# Patient Record
Sex: Female | Born: 1995
Health system: Southern US, Community
[De-identification: ages and names within clinical notes are randomized; demographics above are authoritative.]

## PROBLEM LIST (undated history)

## (undated) ENCOUNTER — Inpatient Hospital Stay (HOSPITAL_COMMUNITY): Payer: Self-pay

## (undated) ENCOUNTER — Inpatient Hospital Stay: Payer: Self-pay

## (undated) DIAGNOSIS — A749 Chlamydial infection, unspecified: Secondary | ICD-10-CM

## (undated) DIAGNOSIS — F329 Major depressive disorder, single episode, unspecified: Secondary | ICD-10-CM

## (undated) DIAGNOSIS — O24419 Gestational diabetes mellitus in pregnancy, unspecified control: Secondary | ICD-10-CM

## (undated) DIAGNOSIS — Z5900 Homelessness unspecified: Secondary | ICD-10-CM

## (undated) DIAGNOSIS — Z6711 Type A blood, Rh negative: Secondary | ICD-10-CM

## (undated) DIAGNOSIS — B1920 Unspecified viral hepatitis C without hepatic coma: Secondary | ICD-10-CM

## (undated) DIAGNOSIS — J45909 Unspecified asthma, uncomplicated: Secondary | ICD-10-CM

## (undated) DIAGNOSIS — F419 Anxiety disorder, unspecified: Secondary | ICD-10-CM

## (undated) DIAGNOSIS — B999 Unspecified infectious disease: Secondary | ICD-10-CM

## (undated) DIAGNOSIS — F1910 Other psychoactive substance abuse, uncomplicated: Secondary | ICD-10-CM

## (undated) DIAGNOSIS — F1111 Opioid abuse, in remission: Secondary | ICD-10-CM

## (undated) DIAGNOSIS — F32A Depression, unspecified: Secondary | ICD-10-CM

---
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
## 2014-07-11 ENCOUNTER — Emergency Department (HOSPITAL_COMMUNITY)
Admission: EM | Admit: 2014-07-11 | Discharge: 2014-07-11 | Disposition: A | Discharge status: Home / Self Care | Payer: Medicaid Other | Attending: Emergency Medicine | Admitting: Emergency Medicine

## 2014-07-11 ENCOUNTER — Encounter (HOSPITAL_COMMUNITY): Payer: Self-pay | Admitting: Emergency Medicine

## 2014-07-11 ENCOUNTER — Emergency Department (HOSPITAL_COMMUNITY): Payer: Medicaid Other

## 2014-07-11 DIAGNOSIS — IMO0002 Reserved for concepts with insufficient information to code with codable children: Secondary | ICD-10-CM | POA: Insufficient documentation

## 2014-07-11 DIAGNOSIS — Z880 Allergy status to penicillin: Secondary | ICD-10-CM | POA: Diagnosis not present

## 2014-07-11 DIAGNOSIS — Z79899 Other long term (current) drug therapy: Secondary | ICD-10-CM | POA: Diagnosis not present

## 2014-07-11 DIAGNOSIS — S0993XA Unspecified injury of face, initial encounter: Secondary | ICD-10-CM | POA: Diagnosis not present

## 2014-07-11 DIAGNOSIS — Y92410 Unspecified street and highway as the place of occurrence of the external cause: Secondary | ICD-10-CM | POA: Insufficient documentation

## 2014-07-11 DIAGNOSIS — Y9389 Activity, other specified: Secondary | ICD-10-CM | POA: Insufficient documentation

## 2014-07-11 DIAGNOSIS — S0990XA Unspecified injury of head, initial encounter: Secondary | ICD-10-CM | POA: Insufficient documentation

## 2014-07-11 DIAGNOSIS — Z3202 Encounter for pregnancy test, result negative: Secondary | ICD-10-CM | POA: Diagnosis not present

## 2014-07-11 DIAGNOSIS — F17200 Nicotine dependence, unspecified, uncomplicated: Secondary | ICD-10-CM | POA: Insufficient documentation

## 2014-07-11 DIAGNOSIS — Z8619 Personal history of other infectious and parasitic diseases: Secondary | ICD-10-CM | POA: Insufficient documentation

## 2014-07-11 DIAGNOSIS — J45909 Unspecified asthma, uncomplicated: Secondary | ICD-10-CM | POA: Insufficient documentation

## 2014-07-11 DIAGNOSIS — S199XXA Unspecified injury of neck, initial encounter: Secondary | ICD-10-CM | POA: Diagnosis not present

## 2014-07-11 LAB — URINALYSIS, ROUTINE W REFLEX MICROSCOPIC
BILIRUBIN URINE: NEGATIVE
Glucose, UA: NEGATIVE mg/dL
Hgb urine dipstick: NEGATIVE
NITRITE: NEGATIVE
PH: 6.5 (ref 5.0–8.0)
Protein, ur: NEGATIVE mg/dL
Urobilinogen, UA: 0.2 mg/dL (ref 0.0–1.0)

## 2014-07-11 LAB — URINE MICROSCOPIC-ADD ON

## 2014-07-11 LAB — PREGNANCY, URINE: PREG TEST UR: NEGATIVE

## 2014-07-11 MED ORDER — IBUPROFEN 800 MG PO TABS
800.0000 mg | ORAL_TABLET | Freq: Once | ORAL | Status: AC
  Administered 2014-07-11: 800 mg via ORAL
  Filled 2014-07-11: qty 1

## 2014-07-11 NOTE — ED Notes (Signed)
Pt remains in xray.
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
## 2014-07-11 NOTE — ED Notes (Signed)
Gown found on bed. PT not found anywhere in the dept. Pt did not inform anyone that she was leaving.

## 2014-09-23 ENCOUNTER — Encounter (HOSPITAL_COMMUNITY): Payer: Self-pay | Admitting: *Deleted

## 2014-09-23 ENCOUNTER — Emergency Department (HOSPITAL_COMMUNITY)
Admission: EM | Admit: 2014-09-23 | Discharge: 2014-09-23 | Discharge status: Against Medical Advice | Payer: Medicaid Other | Attending: Emergency Medicine | Admitting: Emergency Medicine

## 2014-09-23 DIAGNOSIS — F17210 Nicotine dependence, cigarettes, uncomplicated: Secondary | ICD-10-CM | POA: Diagnosis not present

## 2014-09-23 DIAGNOSIS — O99331 Smoking (tobacco) complicating pregnancy, first trimester: Secondary | ICD-10-CM | POA: Diagnosis not present

## 2014-09-23 DIAGNOSIS — O208 Other hemorrhage in early pregnancy: Secondary | ICD-10-CM | POA: Diagnosis present

## 2014-09-23 DIAGNOSIS — Z3A01 Less than 8 weeks gestation of pregnancy: Secondary | ICD-10-CM | POA: Insufficient documentation

## 2014-09-23 DIAGNOSIS — Z8619 Personal history of other infectious and parasitic diseases: Secondary | ICD-10-CM | POA: Insufficient documentation

## 2014-09-23 DIAGNOSIS — O99511 Diseases of the respiratory system complicating pregnancy, first trimester: Secondary | ICD-10-CM | POA: Insufficient documentation

## 2014-09-23 DIAGNOSIS — J45909 Unspecified asthma, uncomplicated: Secondary | ICD-10-CM | POA: Diagnosis not present

## 2014-09-23 NOTE — ED Notes (Signed)
Vag bleeding with cramping. [redacted] weeks pregnant.

## 2014-11-06 ENCOUNTER — Emergency Department: Payer: Self-pay | Admitting: Emergency Medicine

## 2014-11-06 LAB — DRUG SCREEN, URINE
Amphetamines, Ur Screen: POSITIVE (ref ?–1000)
Barbiturates, Ur Screen: NEGATIVE (ref ?–200)
Benzodiazepine, Ur Scrn: POSITIVE (ref ?–200)
CANNABINOID 50 NG, UR ~~LOC~~: POSITIVE (ref ?–50)
COCAINE METABOLITE, UR ~~LOC~~: POSITIVE (ref ?–300)
MDMA (Ecstasy)Ur Screen: NEGATIVE (ref ?–500)
Methadone, Ur Screen: NEGATIVE (ref ?–300)
OPIATE, UR SCREEN: NEGATIVE (ref ?–300)
Phencyclidine (PCP) Ur S: NEGATIVE (ref ?–25)
Tricyclic, Ur Screen: NEGATIVE (ref ?–1000)

## 2014-11-07 LAB — URINALYSIS, COMPLETE
BILIRUBIN, UR: NEGATIVE
Glucose,UR: NEGATIVE mg/dL (ref 0–75)
KETONE: NEGATIVE
Nitrite: NEGATIVE
PH: 6 (ref 4.5–8.0)
PROTEIN: NEGATIVE
RBC,UR: 22 /HPF (ref 0–5)
SPECIFIC GRAVITY: 1.005 (ref 1.003–1.030)
WBC UR: 11 /HPF (ref 0–5)

## 2015-03-22 DIAGNOSIS — B1920 Unspecified viral hepatitis C without hepatic coma: Secondary | ICD-10-CM | POA: Insufficient documentation

## 2015-03-22 HISTORY — DX: Unspecified viral hepatitis C without hepatic coma: B19.20

## 2015-06-28 ENCOUNTER — Encounter (HOSPITAL_COMMUNITY): Payer: Self-pay | Admitting: Emergency Medicine

## 2015-06-28 DIAGNOSIS — Z79899 Other long term (current) drug therapy: Secondary | ICD-10-CM | POA: Insufficient documentation

## 2015-06-28 DIAGNOSIS — Z8619 Personal history of other infectious and parasitic diseases: Secondary | ICD-10-CM | POA: Insufficient documentation

## 2015-06-28 DIAGNOSIS — K5900 Constipation, unspecified: Secondary | ICD-10-CM | POA: Insufficient documentation

## 2015-06-28 DIAGNOSIS — J45909 Unspecified asthma, uncomplicated: Secondary | ICD-10-CM | POA: Insufficient documentation

## 2015-06-28 DIAGNOSIS — R55 Syncope and collapse: Secondary | ICD-10-CM | POA: Diagnosis present

## 2015-06-28 DIAGNOSIS — Z720 Tobacco use: Secondary | ICD-10-CM | POA: Diagnosis not present

## 2015-06-28 DIAGNOSIS — Z880 Allergy status to penicillin: Secondary | ICD-10-CM | POA: Insufficient documentation

## 2015-06-28 LAB — CBC
HCT: 35.2 % — ABNORMAL LOW (ref 36.0–46.0)
HEMOGLOBIN: 12 g/dL (ref 12.0–15.0)
MCH: 30.8 pg (ref 26.0–34.0)
MCHC: 34.1 g/dL (ref 30.0–36.0)
MCV: 90.5 fL (ref 78.0–100.0)
PLATELETS: 139 10*3/uL — AB (ref 150–400)
RBC: 3.89 MIL/uL (ref 3.87–5.11)
RDW: 12.7 % (ref 11.5–15.5)
WBC: 5.2 10*3/uL (ref 4.0–10.5)

## 2015-06-28 LAB — CBG MONITORING, ED: GLUCOSE-CAPILLARY: 81 mg/dL (ref 65–99)

## 2015-06-28 NOTE — ED Notes (Addendum)
Pt arrives with syncope, slurred speech, and dizziness onset 1900 this evening. Pt lethargic in triage. Reports marijuana use, but no other drugs.

## 2015-06-29 ENCOUNTER — Emergency Department (HOSPITAL_COMMUNITY)
Admission: EM | Admit: 2015-06-29 | Discharge: 2015-06-29 | Disposition: A | Discharge status: Home / Self Care | Payer: Medicaid Other | Attending: Emergency Medicine | Admitting: Emergency Medicine

## 2015-06-29 DIAGNOSIS — K5900 Constipation, unspecified: Secondary | ICD-10-CM

## 2015-06-29 HISTORY — DX: Unspecified viral hepatitis C without hepatic coma: B19.20

## 2015-06-29 LAB — HEPATIC FUNCTION PANEL
ALBUMIN: 3.7 g/dL (ref 3.5–5.0)
ALK PHOS: 47 U/L (ref 38–126)
ALT: 45 U/L (ref 14–54)
AST: 48 U/L — ABNORMAL HIGH (ref 15–41)
Bilirubin, Direct: <0.1 mg/dL — ABNORMAL LOW (ref 0.1–0.5)
TOTAL PROTEIN: 6.6 g/dL (ref 6.5–8.1)
Total Bilirubin: 1.2 mg/dL (ref 0.3–1.2)

## 2015-06-29 LAB — BASIC METABOLIC PANEL
Anion gap: 9 (ref 5–15)
BUN: 11 mg/dL (ref 6–20)
CO2: 24 mmol/L (ref 22–32)
Calcium: 9.2 mg/dL (ref 8.9–10.3)
Chloride: 101 mmol/L (ref 101–111)
Creatinine, Ser: 0.66 mg/dL (ref 0.44–1.00)
GFR calc non Af Amer: >60 mL/min (ref >60)
Glucose, Bld: 75 mg/dL (ref 65–99)
POTASSIUM: 3.8 mmol/L (ref 3.5–5.1)
Sodium: 134 mmol/L — ABNORMAL LOW (ref 135–145)

## 2015-06-29 MED ORDER — POLYETHYLENE GLYCOL 3350 17 G PO PACK
17.0000 g | PACK | Freq: Once | ORAL | Status: AC
  Administered 2015-06-29: 17 g via ORAL
  Filled 2015-06-29: qty 1

## 2015-06-29 MED ORDER — SODIUM CHLORIDE 0.9 % IV BOLUS (SEPSIS)
1000.0000 mL | Freq: Once | INTRAVENOUS | Status: AC
  Administered 2015-06-29: 1000 mL via INTRAVENOUS

## 2015-06-29 MED ORDER — POLYETHYLENE GLYCOL 3350 17 G PO PACK: 17.0000 g | PACK | Freq: Once | ORAL | Status: DC

## 2015-06-29 NOTE — Discharge Instructions (Signed)
Constipation Laurie [name], your blood work today is normal.You are refusing to give a urine sample. This may be the cause of your abdominal pain and we can not tell without urine. Sea a primary care physician within 3 days for close follow-up. Take miralax as needed for constipation, if symptoms worsen come back to emergency department immediately. Thank you. Constipation is when a person:  Poops (has a bowel movement) less than 3 times a week.  Has a hard time pooping.  Has poop that is dry, hard, or bigger than normal. HOME CARE   Eat foods with a lot of fiber in them. This includes fruits, vegetables, beans, and whole grains such as brown rice.  Avoid fatty foods and foods with a lot of sugar. This includes french fries, hamburgers, cookies, candy, and soda.  If you are not getting enough fiber from food, take products with added fiber in them (supplements).  Drink enough fluid to keep your pee (urine) clear or pale yellow.  Exercise on a regular basis, or as told by your doctor.  Go to the restroom when you feel like you need to poop. Do not hold it.  Only take medicine as told by your doctor. Do not take medicines that help you poop (laxatives) without talking to your doctor first. GET HELP RIGHT AWAY IF:   You have bright red blood in your poop (stool).  Your constipation lasts more than 4 days or gets worse.  You have belly (abdominal) or butt (rectal) pain.  You have thin poop (as thin as a pencil).  You lose weight, and it cannot be explained. MAKE SURE YOU:   Understand these instructions.  Will watch your condition.  Will get help right away if you are not doing well or get worse. Document Released: 03/25/2008 Document Revised: 10/12/2013 Document Reviewed: 07/19/2013 [facility] Patient Information 2015 [place], [place]. This information is not intended to replace advice given to you by your health care provider. Make sure you discuss any questions you have with your  health care provider. Syncope Syncope means a person passes out (faints). The person usually wakes up in less than 5 minutes. It is important to seek medical care for syncope. HOME CARE  Have someone stay with you until you feel normal.  Do not drive, use machines, or play sports until your doctor says it is okay.  Keep all doctor visits as told.  Lie down when you feel like you might pass out. Take deep breaths. Wait until you feel normal before standing up.  Drink enough fluids to keep your pee (urine) clear or pale yellow.  If you take blood pressure or heart medicine, get up slowly. Take several minutes to sit and then stand. GET HELP RIGHT AWAY IF:   You have a severe headache.  You have pain in the chest, belly (abdomen), or back.  You are bleeding from the mouth or butt (rectum).  You have black or tarry poop (stool).  You have an irregular or very fast heartbeat.  You have pain with breathing.  You keep passing out, or you have shaking (seizures) when you pass out.  You pass out when sitting or lying down.  You feel confused.  You have trouble walking.  You have severe weakness.  You have vision problems. If you fainted, call your local emergency services (911 in [place] Do not drive yourself to the hospital. MAKE SURE YOU:   Understand these instructions.  Will watch your condition.  Will get  help right away if you are not doing well or get worse. Document Released: 03/25/2008 Document Revised: 04/07/2012 Document Reviewed: 12/06/2011 [facility] Patient Information 2015 [place], [place]. This information is not intended to replace advice given to you by your health care provider. Make sure you discuss any questions you have with your health care provider.

## 2015-06-29 NOTE — ED Provider Notes (Signed)
CSN: [id number]     Arrival date & time 06/28/15  2207 History  This chart was scribed for Laurie Crumble, MD by Freida Busman, ED Scribe. This patient was seen in room D34C/D34C and the patient's care was started 1:11 AM.   Chief Complaint  Patient presents with  . Loss of Consciousness    The history is provided by the patient. No language interpreter was used.   HPI Comments:  Laurie [name] is a 19 y.o. female with a history of Hep C, who presents to the Emergency Department complaining of lower abdominal pain. She reports associated constipation for a few weeks, nausea and vomiting. She denies fever.  She has no urinary or vaginal complaints. Pt also reports multiple syncopal episodes since 1900 yesterday with lightheadedness and fatigue. No alleviating factors noted. She does use marijuana but denies other drugs. Patient is concerned hepatitis C is causing all these symptoms.  Past Medical History  Diagnosis Date  . Asthma   . Chlamydia   . Pregnant   . Hepatitis C    Past Surgical History  Procedure Laterality Date  . No past surgeries     No family history on file. Social History  Substance Use Topics  . Smoking status: Current Every Day Smoker -- 1.00 packs/day    Types: Cigarettes  . Smokeless tobacco: None  . Alcohol Use: Yes     Comment: Occ   OB History    Gravida Para Term Preterm AB TAB SAB Ectopic Multiple Living       Review of Systems  A complete 10 system review of systems was obtained and all systems are negative except as noted in the HPI and PMH.    Allergies  Penicillins  Home Medications   Prior to Admission medications   Medication Sig Start Date End Date Taking? Authorizing Provider  acetaminophen (TYLENOL) 500 MG tablet Take 1,000 mg by mouth every 6 (six) hours as needed for pain.     Historical Provider, MD  ibuprofen (ADVIL,MOTRIN) 200 MG tablet Take 200 mg by mouth every 6 (six) hours as needed for pain or headache.     Historical Provider, MD  Nutritional Supplements (JUICE PLUS FIBRE PO) Take 2 capsules by mouth 2 (two) times daily.    Historical Provider, MD   BP 99/61 mmHg  Pulse 76  Temp(Src) 98.2 F (36.8 C) (Oral)  Resp 14  SpO2 100%  LMP 04/21/2015 (Approximate)  Breastfeeding? Unknown Physical Exam  Constitutional: She is oriented to person, place, and time. She appears well-developed and well-nourished. No distress.  HENT:  Head: Normocephalic and atraumatic.  Nose: Nose normal.  Mouth/Throat: Oropharynx is clear and moist. No oropharyngeal exudate.  Eyes: Conjunctivae and EOM are normal. Pupils are equal, round, and reactive to light. No scleral icterus.  Neck: Normal range of motion. Neck supple. No JVD present. No tracheal deviation present. No thyromegaly present.  Cardiovascular: Normal rate, regular rhythm and normal heart sounds.  Exam reveals no gallop and no friction rub.   No murmur heard. Pulmonary/Chest: Effort normal and breath sounds normal. No respiratory distress. She has no wheezes. She exhibits no tenderness.  Abdominal: Soft. Bowel sounds are normal. She exhibits no distension and no mass. There is no tenderness. There is no rebound and no guarding.  Musculoskeletal: Normal range of motion. She exhibits no edema or tenderness.  Lymphadenopathy:    She has no cervical adenopathy.  Neurological: She  is alert and oriented to person, place, and time. No cranial nerve deficit. She exhibits normal muscle tone.  Skin: Skin is warm and dry. No rash noted. No erythema. No pallor.  Nursing note and vitals reviewed.   ED Course  Procedures   DIAGNOSTIC STUDIES:  Oxygen Saturation is 100% on RA, normal by my interpretation.    COORDINATION OF CARE:  1:17 AM Discussed treatment plan with pt at bedside and pt agreed to plan.  Labs Review Labs Reviewed  BASIC METABOLIC PANEL - Abnormal; Notable for the following:    Sodium 134 (*)    All other components within normal  limits  CBC - Abnormal; Notable for the following:    HCT 35.2 (*)    Platelets 139 (*)    All other components within normal limits  HEPATIC FUNCTION PANEL - Abnormal; Notable for the following:    AST 48 (*)    Bilirubin, Direct <0.1 (*)    All other components within normal limits  URINALYSIS, ROUTINE W REFLEX MICROSCOPIC (NOT AT [facility])  URINE RAPID DRUG SCREEN, HOSP PERFORMED  CBG MONITORING, ED  POC URINE PREG, ED    Imaging Review No results found. I have personally reviewed and evaluated these images and lab results as part of my medical decision-making.   EKG Interpretation   Date/Time:  Wednesday June 28 2015 22:43:37 EDT Ventricular Rate:  76 PR Interval:  124 QRS Duration: 88 QT Interval:  370 QTC Calculation: 416 R Axis:   83 Text Interpretation:  Normal sinus rhythm Normal ECG Confirmed by Erroll Luna [phone]) on 06/29/2015 1:08:41 AM      MDM   Final diagnoses:  None    Patient presents emergency department for concern for her hepatitis C. She has had multiple complaints including lower abdominal pain, constipation 7 days, lightheadedness and syncope. EKG does not reveal any arrhythmias or cause for syncope. Patient is pending. Will also obtain LFTs. Patient is given IV fluids. I do not believe pelvic exam is warranted as the patient has no abdominal tenderness on exam.  Patient is refusing to give a urine sample. I would like to know if the patient is pregnant or has a urine infection causing her abdominal pain. Is likely patient does not want a drug screen performed on her. She was informed that we may not be able to diagnose her she refuses a sample, patient will not give a sample. At this point the patient, continues to slur her words in the drowsy. It is likely she is on an illicit substance. However she is able to ambulate without any assistance. Friends are in the room. Her vital signs were within her normal limits and she is safe for  discharge.   I personally performed the services described in this documentation, which was scribed in my presence. The recorded information has been reviewed and is accurate.    Laurie Crumble, MD 06/29/15 [phone]

## 2015-06-29 NOTE — ED Notes (Signed)
Attempted to obtain urine specimen from patient, states that she doesn't feel that that is necessary, states she isn't comfortable providing urine. MD notified, plan to discharge.

## 2015-06-29 NOTE — ED Notes (Signed)
Attempted to discharge pt and was not in room,  IV catheter pulled out by patient and left at bedside. RN unable to provide discharge instructions or teaching

## 2015-07-14 ENCOUNTER — Encounter (HOSPITAL_COMMUNITY): Payer: Self-pay

## 2015-07-14 ENCOUNTER — Inpatient Hospital Stay (HOSPITAL_COMMUNITY)
Admission: AD | Admit: 2015-07-14 | Discharge: 2015-07-14 | Disposition: A | Discharge status: Home / Self Care | Payer: Medicaid Other | Source: Ambulatory Visit | Attending: Family Medicine | Admitting: Family Medicine

## 2015-07-14 DIAGNOSIS — O99321 Drug use complicating pregnancy, first trimester: Secondary | ICD-10-CM | POA: Insufficient documentation

## 2015-07-14 DIAGNOSIS — B1920 Unspecified viral hepatitis C without hepatic coma: Secondary | ICD-10-CM | POA: Diagnosis not present

## 2015-07-14 DIAGNOSIS — Z3201 Encounter for pregnancy test, result positive: Secondary | ICD-10-CM

## 2015-07-14 DIAGNOSIS — R51 Headache: Secondary | ICD-10-CM | POA: Diagnosis present

## 2015-07-14 DIAGNOSIS — F1210 Cannabis abuse, uncomplicated: Secondary | ICD-10-CM

## 2015-07-14 DIAGNOSIS — O99331 Smoking (tobacco) complicating pregnancy, first trimester: Secondary | ICD-10-CM

## 2015-07-14 DIAGNOSIS — Z880 Allergy status to penicillin: Secondary | ICD-10-CM | POA: Insufficient documentation

## 2015-07-14 DIAGNOSIS — O98411 Viral hepatitis complicating pregnancy, first trimester: Secondary | ICD-10-CM | POA: Insufficient documentation

## 2015-07-14 DIAGNOSIS — B182 Chronic viral hepatitis C: Secondary | ICD-10-CM

## 2015-07-14 DIAGNOSIS — F1290 Cannabis use, unspecified, uncomplicated: Secondary | ICD-10-CM | POA: Insufficient documentation

## 2015-07-14 DIAGNOSIS — F17200 Nicotine dependence, unspecified, uncomplicated: Secondary | ICD-10-CM

## 2015-07-14 DIAGNOSIS — F17210 Nicotine dependence, cigarettes, uncomplicated: Secondary | ICD-10-CM | POA: Insufficient documentation

## 2015-07-14 DIAGNOSIS — Z3A10 10 weeks gestation of pregnancy: Secondary | ICD-10-CM | POA: Diagnosis not present

## 2015-07-14 HISTORY — DX: Opioid abuse, in remission: F11.11

## 2015-07-14 LAB — URINALYSIS, ROUTINE W REFLEX MICROSCOPIC
Bilirubin Urine: NEGATIVE
GLUCOSE, UA: NEGATIVE mg/dL
Hgb urine dipstick: NEGATIVE
KETONES UR: NEGATIVE mg/dL
LEUKOCYTES UA: NEGATIVE
NITRITE: NEGATIVE
Protein, ur: NEGATIVE mg/dL
Specific Gravity, Urine: 1.015 (ref 1.005–1.030)
Urobilinogen, UA: 4 mg/dL — ABNORMAL HIGH (ref 0.0–1.0)
pH: 7 (ref 5.0–8.0)

## 2015-07-14 LAB — RAPID URINE DRUG SCREEN, HOSP PERFORMED
AMPHETAMINES: NOT DETECTED
BENZODIAZEPINES: NOT DETECTED
Barbiturates: NOT DETECTED
COCAINE: NOT DETECTED
Opiates: NOT DETECTED
Tetrahydrocannabinol: POSITIVE — AB

## 2015-07-14 LAB — WET PREP, GENITAL
Trich, Wet Prep: NONE SEEN
YEAST WET PREP: NONE SEEN

## 2015-07-14 LAB — POCT PREGNANCY, URINE: PREG TEST UR: POSITIVE — AB

## 2015-07-14 MED ORDER — PRENATAL 27-1 MG PO TABS: 1.0000 | ORAL_TABLET | Freq: Every day | ORAL | Status: DC

## 2015-07-14 NOTE — Discharge Instructions (Signed)
No smoking, no drugs, no alcohol.  Take a prenatal vitamin one by mouth every day.  Eat small frequent snacks to avoid nausea.  Begin prenatal care as soon as possible. ° °

## 2015-07-14 NOTE — MAU Note (Addendum)
Unsure of LMP but know I am really late this month. Dizzy and nausea. Peeing a lot. Trying to find out if i am preg. Have not done upt at home. Occ back pain and crampiness more on L side of abd. No pain now. No bleeding but white vag d/c

## 2015-07-14 NOTE — MAU Provider Note (Signed)
History     CSN: [id number]  Arrival date and time: 07/14/15 1906   Seen by provider at 1935    Chief Complaint  Patient presents with  . Possible Pregnancy   HPI Laurie [name] 19 y.o.  Comes to MAU with unsure LMP (possibly July 1) but thinks she might be pregnant.  States she thinks she had some bleeding last month but was not a normal period.  Had a home test yesterday that was slightly positive.  Was seen at the ER earlier in Sept but refused to give a urine sample at that visit (provider suspected possible drug use at that visit).  Has been urinating frequently but does not have dysuria.  Has not been using any condoms or other contraception - thought she could not get pregnant.  Reports use of marijuana last night, smoking cigarettes daily, and used heroin last about 3 months ago.  Reports she has tested positive for Hepatitis C while she was in jail and has not seen a doctor for the condition.  States her sex partner is aware she is positive for Hepatitis C.  Has just gone on her father's insurance - [employer].    Was having some headaches, dizziness, decreased appetite, and low back pain.  Has had some lower abdominal cramping but does not have pain tonight.   OB History    Gravida Para Term Preterm AB TAB SAB Ectopic Multiple Living        Past Medical History  Diagnosis Date  . Asthma   . Chlamydia   . Pregnant   . Hepatitis C   . History of heroin abuse     Past Surgical History  Procedure Laterality Date  . No past surgeries      Family History  Problem Relation Age of Onset  . Diabetes Mother     Social History  Substance Use Topics  . Smoking status: Current Every Day Smoker -- 1.00 packs/day    Types: Cigarettes  . Smokeless tobacco: None  . Alcohol Use: Yes     Comment: Occas use.    Allergies:  Allergies  Allergen Reactions  . Penicillins Other (See Comments)    Results thru an allergy skin test     No prescriptions prior to  admission    Review of Systems  Constitutional: Negative for fever.  Gastrointestinal: Positive for nausea, abdominal pain and constipation. Negative for vomiting.  Genitourinary: Positive for frequency. Negative for dysuria.       Vaginal discharge. No vaginal bleeding.    Neurological: Positive for headaches.   Physical Exam   Blood pressure 119/56, pulse 73, temperature 97.7 F (36.5 C), resp. rate 18, height  (1.6 m), weight 116 lb 9.6 oz (52.889 kg), last menstrual period 04/21/2015, unknown if currently breastfeeding.  Physical Exam  Nursing note and vitals reviewed. Constitutional: She is oriented to person, place, and time. She appears well-developed and well-nourished.  HENT:  Head: Normocephalic.  Eyes: EOM are normal.  Neck: Neck supple.  GI: Soft. There is no tenderness.  Uterus palpated slightly above pubic symphysis.  FHT 150 with doppler.  Genitourinary:  Speculum exam: Vagina - Small amount of creamy discharge, no odor Cervix - No contact bleeding Bimanual exam: Cervix closed Uterus non tender, 10 week size Adnexa non tender, no masses bilaterally GC/Chlam, wet prep done Chaperone present for exam.  Musculoskeletal: Normal range of motion.  Neurological: She is alert  and oriented to person, place, and time.  Skin: Skin is warm and dry.  Psychiatric: She has a normal mood and affect.    MAU Course  Procedures  MDM Results for orders placed or performed during the hospital encounter of 07/14/15 (from the past 24 hour(s))  Urinalysis, Routine w reflex microscopic (not at [facility])     Status: Abnormal   Collection Time: 07/14/15  7:20 PM  Result Value Ref Range   Color, Urine YELLOW YELLOW   APPearance CLEAR CLEAR   Specific Gravity, Urine 1.015 1.005 - 1.030   pH 7.0 5.0 - 8.0   Glucose, UA NEGATIVE NEGATIVE mg/dL   Hgb urine dipstick NEGATIVE NEGATIVE   Bilirubin Urine NEGATIVE NEGATIVE   Ketones, ur NEGATIVE NEGATIVE mg/dL   Protein, ur  NEGATIVE NEGATIVE mg/dL   Urobilinogen, UA 4.0 (H) 0.0 - 1.0 mg/dL   Nitrite NEGATIVE NEGATIVE   Leukocytes, UA NEGATIVE NEGATIVE  Urine rapid drug screen (hosp performed)     Status: Abnormal   Collection Time: 07/14/15  7:20 PM  Result Value Ref Range   Opiates NONE DETECTED NONE DETECTED   Cocaine NONE DETECTED NONE DETECTED   Benzodiazepines NONE DETECTED NONE DETECTED   Amphetamines NONE DETECTED NONE DETECTED   Tetrahydrocannabinol POSITIVE (A) NONE DETECTED   Barbiturates NONE DETECTED NONE DETECTED  Pregnancy, urine POC     Status: Abnormal   Collection Time: 07/14/15  7:31 PM  Result Value Ref Range   Preg Test, Ur POSITIVE (A) NEGATIVE  Wet prep, genital     Status: Abnormal   Collection Time: 07/14/15  7:45 PM  Result Value Ref Range   Yeast Wet Prep HPF POC NONE SEEN NONE SEEN   Trich, Wet Prep NONE SEEN NONE SEEN   Clue Cells Wet Prep HPF POC FEW (A) NONE SEEN   WBC, Wet Prep HPF POC FEW (A) NONE SEEN     Assessment and Plan  Pregnancy 10-12 weeks, Unsure LMP Hx of heroin use and current marijuana use Hepatitis C with no current management Smoker  Plan Advised no smoking, no alcohol, and emphasized to stop all drug use. Will prescribe prenatal vitamins Begin prenatal care as soon as possible. Discussed appropriate eating habits for pregnancy to avoid being dizzy and to avoid syncopal episodes. Take Tylenol 325 mg 2 tablets by mouth every 4 hours if needed for pain. Drink at least 8 8-oz glasses of water every day.     BURLESON,TERRI 07/14/2015, 8:01 PM

## 2015-07-15 LAB — RPR: RPR Ser Ql: NONREACTIVE

## 2015-07-15 LAB — HIV ANTIBODY (ROUTINE TESTING W REFLEX): HIV SCREEN 4TH GENERATION: NONREACTIVE

## 2015-07-17 LAB — GC/CHLAMYDIA PROBE AMP (~~LOC~~) NOT AT ARMC
Chlamydia: NEGATIVE
Neisseria Gonorrhea: NEGATIVE

## 2015-07-23 ENCOUNTER — Encounter (HOSPITAL_COMMUNITY): Payer: Self-pay | Admitting: *Deleted

## 2015-07-23 ENCOUNTER — Inpatient Hospital Stay (HOSPITAL_COMMUNITY)
Admission: AD | Admit: 2015-07-23 | Discharge: 2015-07-23 | Disposition: A | Discharge status: Home / Self Care | Payer: Medicaid Other | Source: Ambulatory Visit | Attending: Obstetrics and Gynecology | Admitting: Obstetrics and Gynecology

## 2015-07-23 DIAGNOSIS — R109 Unspecified abdominal pain: Secondary | ICD-10-CM

## 2015-07-23 DIAGNOSIS — O2311 Infections of bladder in pregnancy, first trimester: Secondary | ICD-10-CM

## 2015-07-23 DIAGNOSIS — O26899 Other specified pregnancy related conditions, unspecified trimester: Secondary | ICD-10-CM

## 2015-07-23 DIAGNOSIS — Z87891 Personal history of nicotine dependence: Secondary | ICD-10-CM | POA: Insufficient documentation

## 2015-07-23 DIAGNOSIS — N760 Acute vaginitis: Secondary | ICD-10-CM

## 2015-07-23 DIAGNOSIS — B9689 Other specified bacterial agents as the cause of diseases classified elsewhere: Secondary | ICD-10-CM | POA: Diagnosis not present

## 2015-07-23 DIAGNOSIS — K5900 Constipation, unspecified: Secondary | ICD-10-CM | POA: Diagnosis not present

## 2015-07-23 DIAGNOSIS — O23591 Infection of other part of genital tract in pregnancy, first trimester: Secondary | ICD-10-CM | POA: Insufficient documentation

## 2015-07-23 DIAGNOSIS — N3090 Cystitis, unspecified without hematuria: Secondary | ICD-10-CM | POA: Insufficient documentation

## 2015-07-23 DIAGNOSIS — O9989 Other specified diseases and conditions complicating pregnancy, childbirth and the puerperium: Secondary | ICD-10-CM

## 2015-07-23 DIAGNOSIS — Z3A13 13 weeks gestation of pregnancy: Secondary | ICD-10-CM | POA: Diagnosis not present

## 2015-07-23 HISTORY — DX: Unspecified infectious disease: B99.9

## 2015-07-23 HISTORY — DX: Anxiety disorder, unspecified: F41.9

## 2015-07-23 HISTORY — DX: Major depressive disorder, single episode, unspecified: F32.9

## 2015-07-23 HISTORY — DX: Depression, unspecified: F32.A

## 2015-07-23 LAB — URINALYSIS, ROUTINE W REFLEX MICROSCOPIC
BILIRUBIN URINE: NEGATIVE
GLUCOSE, UA: NEGATIVE mg/dL
Ketones, ur: NEGATIVE mg/dL
Nitrite: NEGATIVE
PH: 6 (ref 5.0–8.0)
Protein, ur: NEGATIVE mg/dL
UROBILINOGEN UA: 0.2 mg/dL (ref 0.0–1.0)

## 2015-07-23 LAB — RAPID URINE DRUG SCREEN, HOSP PERFORMED
Amphetamines: NOT DETECTED
BARBITURATES: NOT DETECTED
Benzodiazepines: NOT DETECTED
Cocaine: NOT DETECTED
OPIATES: NOT DETECTED
TETRAHYDROCANNABINOL: POSITIVE — AB

## 2015-07-23 LAB — CBC
HCT: 32.8 % — ABNORMAL LOW (ref 36.0–46.0)
Hemoglobin: 11.1 g/dL — ABNORMAL LOW (ref 12.0–15.0)
MCH: 30.7 pg (ref 26.0–34.0)
MCHC: 33.8 g/dL (ref 30.0–36.0)
MCV: 90.9 fL (ref 78.0–100.0)
PLATELETS: 123 10*3/uL — AB (ref 150–400)
RBC: 3.61 MIL/uL — ABNORMAL LOW (ref 3.87–5.11)
RDW: 13.4 % (ref 11.5–15.5)
WBC: 5.7 10*3/uL (ref 4.0–10.5)

## 2015-07-23 LAB — URINE MICROSCOPIC-ADD ON

## 2015-07-23 LAB — WET PREP, GENITAL
Trich, Wet Prep: NONE SEEN
YEAST WET PREP: NONE SEEN

## 2015-07-23 LAB — ABO/RH: ABO/RH(D): A NEG

## 2015-07-23 MED ORDER — CEPHALEXIN 500 MG PO CAPS: 500.0000 mg | ORAL_CAPSULE | Freq: Four times a day (QID) | ORAL | Status: DC

## 2015-07-23 MED ORDER — ACETAMINOPHEN 325 MG PO TABS
650.0000 mg | ORAL_TABLET | Freq: Once | ORAL | Status: AC
  Administered 2015-07-23: 650 mg via ORAL
  Filled 2015-07-23: qty 2

## 2015-07-23 MED ORDER — METRONIDAZOLE 500 MG PO TABS: 500.0000 mg | ORAL_TABLET | Freq: Two times a day (BID) | ORAL | Status: DC

## 2015-07-23 MED ORDER — ONDANSETRON 8 MG PO TBDP
8.0000 mg | ORAL_TABLET | Freq: Once | ORAL | Status: AC
  Administered 2015-07-23: 8 mg via ORAL
  Filled 2015-07-23: qty 1

## 2015-07-23 NOTE — MAU Provider Note (Signed)
History     CSN: [id number]  Arrival date and time: 07/23/15 1526   First Provider Initiated Contact with Patient 07/23/15 1630         Chief Complaint  Patient presents with  . Abdominal Pain   HPI  Laurie [name] is a 19 y.o. G2P0010 at [redacted]w[redacted]d who presents with abdominal cramping.  Symptoms started 3 days ago. Intermittent cramp like pain in lower abdomen. Rates 10/10. No treatment.  Denies vaginal bleeding.  Milky off white discharge with foul odor. Denies vaginal itching/irritation.  Increased urinary frequency & worsening abdominal pain with urination.  Last BM was last night; reports constipation & straining with bowel movements.  Denies fever.    OB History    Gravida Para Term Preterm AB TAB SAB Ectopic Multiple Living        Past Medical History  Diagnosis Date  . Chlamydia   . Pregnant   . History of heroin abuse   . Asthma     "grew out"  . Hepatitis C June 2016  . Infection     UTI  . Anxiety   . Depression     doing really good    Past Surgical History  Procedure Laterality Date  . No past surgeries      Family History  Problem Relation Age of Onset  . Diabetes Mother   . Pulmonary embolism Mother   . Stroke Mother   . Epilepsy Brother   . Cancer Paternal Grandmother     liver    Social History  Substance Use Topics  . Smoking status: Former Smoker -- 1.00 packs/day    Types: Cigarettes  . Smokeless tobacco: Never Used     Comment: quit with + preg test  . Alcohol Use: Yes     Comment: Occas use.; none since mid Sept    Allergies:  Allergies  Allergen Reactions  . Penicillins Other (See Comments)    Results thru an allergy skin test     Prescriptions prior to admission  Medication Sig Dispense Refill Last Dose  . PRENATAL 27-1 MG TABS Take 1 tablet by mouth daily. 30 each 0     ROS Physical Exam   Blood pressure 106/62, pulse 85, temperature 98.2 F (36.8 C), temperature source Oral, resp. rate 16,  weight 117 lb 6.4 oz (53.252 kg), last menstrual period 04/21/2015, unknown if currently breastfeeding.  Physical Exam  Nursing note and vitals reviewed. Constitutional: She is oriented to person, place, and time. She appears well-developed and well-nourished. No distress.  HENT:  Head: Normocephalic and atraumatic.  Eyes: Conjunctivae are normal. Right eye exhibits no discharge. Left eye exhibits no discharge. No scleral icterus.  Neck: Normal range of motion.  Cardiovascular: Normal rate, regular rhythm and normal heart sounds.   No murmur heard. Respiratory: Effort normal and breath sounds normal. No respiratory distress. She has no wheezes.  GI: Soft.  Genitourinary: Vagina normal. Rectal exam shows no external hemorrhoid. Cervix exhibits discharge (small amount of thin off-white discharge). Cervix exhibits no motion tenderness and no friability.  Neurological: She is alert and oriented to person, place, and time.  Skin: Skin is warm and dry. She is not diaphoretic.  Psychiatric: She has a normal mood and affect. Her behavior is normal. Judgment and thought content normal.   FHT 153 MAU Course  Procedures Results for orders placed or performed during the hospital encounter of 07/23/15 (from the past  24 hour(s))  Urinalysis, Routine w reflex microscopic (not at [facility])     Status: Abnormal   Collection Time: 07/23/15  3:45 PM  Result Value Ref Range   Color, Urine YELLOW YELLOW   APPearance CLEAR CLEAR   Specific Gravity, Urine <1.005 (L) 1.005 - 1.030   pH 6.0 5.0 - 8.0   Glucose, UA NEGATIVE NEGATIVE mg/dL   Hgb urine dipstick TRACE (A) NEGATIVE   Bilirubin Urine NEGATIVE NEGATIVE   Ketones, ur NEGATIVE NEGATIVE mg/dL   Protein, ur NEGATIVE NEGATIVE mg/dL   Urobilinogen, UA 0.2 0.0 - 1.0 mg/dL   Nitrite NEGATIVE NEGATIVE   Leukocytes, UA LARGE (A) NEGATIVE  Urine rapid drug screen (hosp performed)     Status: Abnormal   Collection Time: 07/23/15  3:45 PM  Result Value Ref  Range   Opiates NONE DETECTED NONE DETECTED   Cocaine NONE DETECTED NONE DETECTED   Benzodiazepines NONE DETECTED NONE DETECTED   Amphetamines NONE DETECTED NONE DETECTED   Tetrahydrocannabinol POSITIVE (A) NONE DETECTED   Barbiturates NONE DETECTED NONE DETECTED  Urine microscopic-add on     Status: Abnormal   Collection Time: 07/23/15  3:45 PM  Result Value Ref Range   Squamous Epithelial / LPF FEW (A) RARE   WBC, UA 21-50 <3 WBC/hpf   RBC / HPF 0-2 <3 RBC/hpf   Bacteria, UA RARE RARE  CBC     Status: Abnormal   Collection Time: 07/23/15  4:49 PM  Result Value Ref Range   WBC 5.7 4.0 - 10.5 K/uL   RBC 3.61 (L) 3.87 - 5.11 MIL/uL   Hemoglobin 11.1 (L) 12.0 - 15.0 g/dL   HCT 16.1 (L) 09.6 - 04.5 %   MCV 90.9 78.0 - 100.0 fL   MCH 30.7 26.0 - 34.0 pg   MCHC 33.8 30.0 - 36.0 g/dL   RDW 40.9 81.1 - 91.4 %   Platelets 123 (L) 150 - 400 K/uL  ABO/Rh     Status: None (Preliminary result)   Collection Time: 07/23/15  4:49 PM  Result Value Ref Range   ABO/RH(D) A NEG   Wet prep, genital     Status: Abnormal   Collection Time: 07/23/15  5:25 PM  Result Value Ref Range   Yeast Wet Prep HPF POC NONE SEEN NONE SEEN   Trich, Wet Prep NONE SEEN NONE SEEN   Clue Cells Wet Prep HPF POC FEW (A) NONE SEEN   WBC, Wet Prep HPF POC MANY (A) NONE SEEN    MDM FHT 153 by doppler Plan on starting care with Dr. Clearance Coots this weekend.  Tylenol & zofran given - reports relief in symptoms GC/CT deferred as they were negative 1 week ago  Assessment and Plan  A: 1. Abdominal cramping affecting pregnancy   2. BV (bacterial vaginosis)   3. Cystitis during pregnancy in first trimester, antepartum   4. Constipation, unspecified constipation type    P: Discharge home Keep prenatal appt Increase fiber in diet & increase water intake Take OTC stool softener daily Rx keflex & flagyl Urine culture pending  [name], NP   07/23/2015, 4:24 PM

## 2015-07-23 NOTE — Discharge Instructions (Signed)
Constipation °Constipation is when a person has fewer than three bowel movements a week, has difficulty having a bowel movement, or has stools that are dry, hard, or larger than normal. As people grow older, constipation is more common. If you try to fix constipation with medicines that make you have a bowel movement (laxatives), the problem may get worse. Long-term laxative use may cause the muscles of the colon to become weak. A low-fiber diet, not taking in enough fluids, and taking certain medicines may make constipation worse.  °CAUSES  °· Certain medicines, such as antidepressants, pain medicine, iron supplements, antacids, and water pills.   °· Certain diseases, such as diabetes, irritable bowel syndrome (IBS), thyroid disease, or depression.   °· Not drinking enough water.   °· Not eating enough fiber-rich foods.   °· Stress or travel.   °· Lack of physical activity or exercise.   °· Ignoring the urge to have a bowel movement.   °· Using laxatives too much.   °SIGNS AND SYMPTOMS  °· Having fewer than three bowel movements a week.   °· Straining to have a bowel movement.   °· Having stools that are hard, dry, or larger than normal.   °· Feeling full or bloated.   °· Pain in the lower abdomen.   °· Not feeling relief after having a bowel movement.   °DIAGNOSIS  °Your health care provider will take a medical history and perform a physical exam. Further testing may be done for severe constipation. Some tests may include: °· A barium enema X-ray to examine your rectum, colon, and, sometimes, your small intestine.   °· A sigmoidoscopy to examine your lower colon.   °· A colonoscopy to examine your entire colon. °TREATMENT  °Treatment will depend on the severity of your constipation and what is causing it. Some dietary treatments include drinking more fluids and eating more fiber-rich foods. Lifestyle treatments may include regular exercise. If these diet and lifestyle recommendations do not help, your health care  provider may recommend taking over-the-counter laxative medicines to help you have bowel movements. Prescription medicines may be prescribed if over-the-counter medicines do not work.  °HOME CARE INSTRUCTIONS  °· Eat foods that have a lot of fiber, such as fruits, vegetables, whole grains, and beans. °· Limit foods high in fat and processed sugars, such as french fries, hamburgers, cookies, candies, and soda.   °· A fiber supplement may be added to your diet if you cannot get enough fiber from foods.   °· Drink enough fluids to keep your urine clear or pale yellow.   °· Exercise regularly or as directed by your health care provider.   °· Go to the restroom when you have the urge to go. Do not hold it.   °· Only take over-the-counter or prescription medicines as directed by your health care provider. Do not take other medicines for constipation without talking to your health care provider first.   °SEEK IMMEDIATE MEDICAL CARE IF:  °· You have bright red blood in your stool.   °· Your constipation lasts for more than 4 days or gets worse.   °· You have abdominal or rectal pain.   °· You have thin, pencil-like stools.   °· You have unexplained weight loss. °MAKE SURE YOU:  °· Understand these instructions. °· Will watch your condition. °· Will get help right away if you are not doing well or get worse. °Document Released: 07/05/2004 Document Revised: 10/12/2013 Document Reviewed: 07/19/2013 °ExitCare® Patient Information ©2015 [employer]. This information is not intended to replace advice given to you by your health care provider. Make sure you discuss any questions   you have with your health care provider. ° °Bacterial Vaginosis °Bacterial vaginosis is a vaginal infection that occurs when the normal balance of bacteria in the vagina is disrupted. It results from an overgrowth of certain bacteria. This is the most common vaginal infection in women of childbearing age. Treatment is important to prevent complications,  especially in pregnant women, as it can cause a premature delivery. °CAUSES  °Bacterial vaginosis is caused by an increase in harmful bacteria that are normally present in smaller amounts in the vagina. Several different kinds of bacteria can cause bacterial vaginosis. However, the reason that the condition develops is not fully understood. °RISK FACTORS °Certain activities or behaviors can put you at an increased risk of developing bacterial vaginosis, including: °· Having a new sex partner or multiple sex partners. °· Douching. °· Using an intrauterine device (IUD) for contraception. °Women do not get bacterial vaginosis from toilet seats, bedding, swimming pools, or contact with objects around them. °SIGNS AND SYMPTOMS  °Some women with bacterial vaginosis have no signs or symptoms. Common symptoms include: °· Grey vaginal discharge. °· A fishlike odor with discharge, especially after sexual intercourse. °· Itching or burning of the vagina and vulva. °· Burning or pain with urination. °DIAGNOSIS  °Your health care provider will take a medical history and examine the vagina for signs of bacterial vaginosis. A sample of vaginal fluid may be taken. Your health care provider will look at this sample under a microscope to check for bacteria and abnormal cells. A vaginal pH test may also be done.  °TREATMENT  °Bacterial vaginosis may be treated with antibiotic medicines. These may be given in the form of a pill or a vaginal cream. A second round of antibiotics may be prescribed if the condition comes back after treatment.  °HOME CARE INSTRUCTIONS  °· Only take over-the-counter or prescription medicines as directed by your health care provider. °· If antibiotic medicine was prescribed, take it as directed. Make sure you finish it even if you start to feel better. °· Do not have sex until treatment is completed. °· Tell all sexual partners that you have a vaginal infection. They should see their health care provider and  be treated if they have problems, such as a mild rash or itching. °· Practice safe sex by using condoms and only having one sex partner. °SEEK MEDICAL CARE IF:  °· Your symptoms are not improving after 3 days of treatment. °· You have increased discharge or pain. °· You have a fever. °MAKE SURE YOU:  °· Understand these instructions. °· Will watch your condition. °· Will get help right away if you are not doing well or get worse. °FOR MORE INFORMATION  °Centers for Disease Control and Prevention, Division of STD Prevention: www.cdc.gov/std °American Sexual Health Association (ASHA): www.ashastd.org  °Document Released: 10/07/2005 Document Revised: 07/28/2013 Document Reviewed: 05/19/2013 °ExitCare® Patient Information ©2015 [employer]. This information is not intended to replace advice given to you by your health care provider. Make sure you discuss any questions you have with your health care provider. ° ° °

## 2015-07-23 NOTE — MAU Note (Signed)
Last several days, has been having really bad cramping.  Sometimes the pain is so bad, she can't even walk.  Lot of pressure when she pees, no pain

## 2015-07-24 LAB — CULTURE, OB URINE: Special Requests: NORMAL

## 2015-07-26 ENCOUNTER — Encounter: Payer: Medicaid Other | Admitting: Certified Nurse Midwife

## 2015-07-28 ENCOUNTER — Ambulatory Visit (INDEPENDENT_AMBULATORY_CARE_PROVIDER_SITE_OTHER): Payer: Medicaid Other | Admitting: Certified Nurse Midwife

## 2015-07-28 ENCOUNTER — Other Ambulatory Visit: Payer: Self-pay | Admitting: Certified Nurse Midwife

## 2015-07-28 ENCOUNTER — Encounter: Payer: Self-pay | Admitting: Certified Nurse Midwife

## 2015-07-28 VITALS — BP 108/66 | HR 85 | Temp 97.8°F | Wt 119.0 lb

## 2015-07-28 DIAGNOSIS — Z3402 Encounter for supervision of normal first pregnancy, second trimester: Secondary | ICD-10-CM | POA: Diagnosis not present

## 2015-07-28 DIAGNOSIS — O219 Vomiting of pregnancy, unspecified: Secondary | ICD-10-CM

## 2015-07-28 DIAGNOSIS — K5901 Slow transit constipation: Secondary | ICD-10-CM

## 2015-07-28 LAB — POCT URINALYSIS DIPSTICK
Bilirubin, UA: NEGATIVE
Blood, UA: NEGATIVE
GLUCOSE UA: NEGATIVE
Ketones, UA: NEGATIVE
LEUKOCYTES UA: NEGATIVE
NITRITE UA: NEGATIVE
Protein, UA: NEGATIVE
Spec Grav, UA: 1.015
UROBILINOGEN UA: NEGATIVE
pH, UA: 6

## 2015-07-28 MED ORDER — DOCUSATE SODIUM 100 MG PO CAPS: 100.0000 mg | ORAL_CAPSULE | Freq: Two times a day (BID) | ORAL | Status: DC

## 2015-07-28 MED ORDER — CITRANATAL HARMONY 30-1-260 MG PO CAPS: 1.0000 | ORAL_CAPSULE | Freq: Every day | ORAL | Status: DC

## 2015-07-28 MED ORDER — METOCLOPRAMIDE HCL 10 MG PO TABS: 10.0000 mg | ORAL_TABLET | Freq: Four times a day (QID) | ORAL | Status: DC

## 2015-07-28 MED ORDER — DOXYLAMINE-PYRIDOXINE 10-10 MG PO TBEC: DELAYED_RELEASE_TABLET | ORAL | Status: DC

## 2015-07-28 NOTE — Progress Notes (Signed)
Subjective:    Laurie [name] is being seen today for her first obstetrical visit.  This is not a planned pregnancy. She is at [redacted]w[redacted]d gestation. Her obstetrical history is significant for smoker and recent Hep. C dx, not treated.  Occasional smoker about 3 times a day. Asthma in childhood.  Heroin use last reported use in June 2016.  Current mariajuana use, last time a few weeks ago.  Relationship with FOB: not involved. Patient does intend to breast feed. Pregnancy history fully reviewed.  The information documented in the HPI was reviewed and verified.  Menstrual History: OB History    Gravida Para Term Preterm AB TAB SAB Ectopic Multiple Living        Menarche age: 19 years of age roughly  Patient's last menstrual period was 04/21/2015. Unsure.     Past Medical History  Diagnosis Date  . Chlamydia   . Pregnant   . History of heroin abuse   . Asthma     "grew out"  . Hepatitis C June 2016  . Infection     UTI  . Anxiety   . Depression     doing really good    Past Surgical History  Procedure Laterality Date  . No past surgeries       (Not in a hospital admission) Allergies  Allergen Reactions  . Penicillins Other (See Comments)    Results thru an allergy skin test Has patient had a PCN reaction causing immediate rash, facial/tongue/throat swelling, SOB or lightheadedness with hypotension: No Has patient had a PCN reaction causing severe rash involving mucus membranes or skin necrosis: No Has patient had a PCN reaction that required hospitalization No Has patient had a PCN reaction occurring within the last 10 years: No If all of the above answers are "NO", then may proceed with Cephalosporin use.      Social History  Substance Use Topics  . Smoking status: Former Smoker -- 1.00 packs/day    Types: Cigarettes  . Smokeless tobacco: Never Used     Comment: quit with + preg test  . Alcohol Use: No     Comment: Occas use.; none since mid Sept     Family History  Problem Relation Age of Onset  . Diabetes Mother   . Pulmonary embolism Mother   . Stroke Mother   . Epilepsy Brother   . Cancer Paternal Grandmother     liver     Review of Systems Constitutional: negative for weight loss Gastrointestinal: + for nausea & vomiting Genitourinary:negative for genital lesions and vaginal discharge and dysuria Musculoskeletal:negative for back pain Behavioral/Psych: negative for abusive relationship, depression, illegal drug usage and tobacco use    Objective:    BP 108/66 mmHg  Pulse 85  Temp(Src) 97.8 F (36.6 C)  Wt 119 lb (53.978 kg)  LMP 04/21/2015 General Appearance:    Alert, cooperative, no distress, appears stated age  Head:    Normocephalic, without obvious abnormality, atraumatic  Eyes:    PERRL, conjunctiva/corneas clear, EOM's intact, fundi    benign, both eyes  Ears:    Normal TM's and external ear canals, both ears  Nose:   Nares normal, septum midline, mucosa normal, no drainage    or sinus tenderness  Throat:   Lips, mucosa, and tongue normal; teeth and gums normal  Neck:   Supple, symmetrical, trachea midline, no adenopathy;    thyroid:  no enlargement/tenderness/nodules; no carotid  bruit or JVD  Back:     Symmetric, no curvature, ROM normal, no CVA tenderness  Lungs:     Clear to auscultation bilaterally, respirations unlabored  Chest Wall:    No tenderness or deformity   Heart:    Regular rate and rhythm, S1 and S2 normal, no murmur, rub   or gallop  Breast Exam:    No tenderness, masses, or nipple abnormality  Abdomen:     Soft, non-tender, bowel sounds active all four quadrants,    no masses, no organomegaly  Genitalia:    Normal female without lesion, discharge or tenderness  Extremities:   Extremities normal, atraumatic, no cyanosis or edema  Pulses:   2+ and symmetric all extremities  Skin:   Skin color, texture, turgor normal, no rashes or lesions  Lymph nodes:   Cervical, supraclavicular, and  axillary nodes normal  Neurologic:   CNII-XII intact, normal strength, sensation and reflexes    throughout                                  Cervix:  Long, thick, closed & posterior                                      FH:  Around symphysis pubis             FHR:   158    Lab Review Urine pregnancy test Labs reviewed yes Radiologic studies reviewed no Assessment:    Pregnancy at [redacted]w[redacted]d weeks   Unsure of LMP  N&V in early pregnancy  Plan:    Ultrasound for dating Prenatal vitamins.  Counseling provided regarding continued use of seat belts, cessation of alcohol consumption, smoking or use of illicit drugs; infection precautions i.e., influenza/TDAP immunizations, toxoplasmosis,CMV, parvovirus, listeria and varicella; workplace safety, exercise during pregnancy; routine dental care, safe medications, sexual activity, hot tubs, saunas, pools, travel, caffeine use, fish and methlymercury, potential toxins, hair treatments, varicose veins Weight gain recommendations per IOM guidelines reviewed: underweight/BMI< 18.5--> gain 28 - 40 lbs; normal weight/BMI 18.5 - 24.9--> gain 25 - 35 lbs; overweight/BMI 25 - 29.9--> gain 15 - 25 lbs; obese/BMI >30->gain  11 - 20 lbs Problem list reviewed and updated. FIRST/CF mutation testing/NIPT/QUAD SCREEN/fragile X/Ashkenazi Jewish population testing/Spinal muscular atrophy discussed: requested. Role of ultrasound in pregnancy discussed; fetal survey: requested. Amniocentesis discussed: not indicated. VBAC calculator score: VBAC consent form provided No orders of the defined types were placed in this encounter.   Orders Placed This Encounter  Procedures  . Culture, OB Urine  . US OB Comp Less 14 Wks    Standing Status: Future     Number of Occurrences:      Standing Expiration Date: 09/26/2016    Order Specific Question:  Reason for Exam (SYMPTOM  OR DIAGNOSIS REQUIRED)    Answer:  unsure of LMP, no ultrasounds this pregnancy    Order Specific  Question:  Preferred imaging location?    Answer:  Internal  . Obstetric panel  . HIV antibody  . Varicella zoster antibody, IgG  . Vit D  25 hydroxy (rtn osteoporosis monitoring)  . TSH  . POCT urinalysis dipstick    Follow up in 4 weeks. 50% of 30 min visit spent on counseling and coordination of care.

## 2015-07-29 LAB — URINE DRUGS OF ABUSE SCREEN W ALC, ROUTINE (REF LAB)
Amphetamine Screen, Ur: NEGATIVE
Barbiturate Quant, Ur: NEGATIVE
Benzodiazepines.: NEGATIVE
CREATININE, U: 119.3 mg/dL
Cocaine Metabolites: NEGATIVE
MARIJUANA METABOLITE: POSITIVE — AB
Methadone: NEGATIVE
Opiate Screen, Urine: NEGATIVE
PHENCYCLIDINE (PCP): NEGATIVE
PROPOXYPHENE: NEGATIVE

## 2015-07-29 LAB — CULTURE, OB URINE
COLONY COUNT: NO GROWTH
ORGANISM ID, BACTERIA: NO GROWTH

## 2015-07-29 LAB — TSH: TSH: 1.74 u[IU]/mL (ref 0.350–4.500)

## 2015-07-29 LAB — VITAMIN D 25 HYDROXY (VIT D DEFICIENCY, FRACTURES): VIT D 25 HYDROXY: 34 ng/mL (ref 30–100)

## 2015-07-29 LAB — HIV ANTIBODY (ROUTINE TESTING W REFLEX): HIV 1&2 Ab, 4th Generation: NONREACTIVE

## 2015-07-31 LAB — OBSTETRIC PANEL
ANTIBODY SCREEN: NEGATIVE
BASOS PCT: 0 % (ref 0–1)
Basophils Absolute: 0 10*3/uL (ref 0.0–0.1)
EOS PCT: 1 % (ref 0–5)
Eosinophils Absolute: 0.1 10*3/uL (ref 0.0–0.7)
HEMATOCRIT: 33.6 % — AB (ref 36.0–46.0)
HEP B S AG: NEGATIVE
Hemoglobin: 11.8 g/dL — ABNORMAL LOW (ref 12.0–15.0)
Lymphocytes Relative: 36 % (ref 12–46)
Lymphs Abs: 2.4 10*3/uL (ref 0.7–4.0)
MCH: 31.1 pg (ref 26.0–34.0)
MCHC: 35.1 g/dL (ref 30.0–36.0)
MCV: 88.4 fL (ref 78.0–100.0)
MONO ABS: 0.5 10*3/uL (ref 0.1–1.0)
MPV: 11.7 fL (ref 8.6–12.4)
Monocytes Relative: 8 % (ref 3–12)
NEUTROS ABS: 3.7 10*3/uL (ref 1.7–7.7)
Neutrophils Relative %: 55 % (ref 43–77)
Platelets: 153 10*3/uL (ref 150–400)
RBC: 3.8 MIL/uL — AB (ref 3.87–5.11)
RDW: 14 % (ref 11.5–15.5)
RH TYPE: NEGATIVE
Rubella: 0.95 Index — ABNORMAL HIGH (ref <0.90)
WBC: 6.8 10*3/uL (ref 4.0–10.5)

## 2015-07-31 LAB — VARICELLA ZOSTER ANTIBODY, IGG: Varicella IgG: 427.9 Index — ABNORMAL HIGH (ref <135.00)

## 2015-08-03 ENCOUNTER — Other Ambulatory Visit: Payer: Self-pay | Admitting: Certified Nurse Midwife

## 2015-08-03 ENCOUNTER — Ambulatory Visit (INDEPENDENT_AMBULATORY_CARE_PROVIDER_SITE_OTHER): Payer: Medicaid Other

## 2015-08-03 DIAGNOSIS — O3680X1 Pregnancy with inconclusive fetal viability, fetus 1: Secondary | ICD-10-CM | POA: Diagnosis not present

## 2015-08-03 DIAGNOSIS — Z3402 Encounter for supervision of normal first pregnancy, second trimester: Secondary | ICD-10-CM

## 2015-08-04 LAB — CANNABANOIDS (GC/LC/MS), URINE: THC-COOH (GC/LC/MS), ur confirm: 53 ng/mL — ABNORMAL HIGH (ref <5)

## 2015-08-06 ENCOUNTER — Encounter (HOSPITAL_COMMUNITY): Payer: Self-pay | Admitting: *Deleted

## 2015-08-06 ENCOUNTER — Inpatient Hospital Stay (HOSPITAL_COMMUNITY)
Admission: AD | Admit: 2015-08-06 | Discharge: 2015-08-06 | Disposition: A | Discharge status: Home / Self Care | Payer: Medicaid Other | Source: Ambulatory Visit | Attending: Obstetrics | Admitting: Obstetrics

## 2015-08-06 DIAGNOSIS — O26852 Spotting complicating pregnancy, second trimester: Secondary | ICD-10-CM | POA: Diagnosis not present

## 2015-08-06 DIAGNOSIS — Z87891 Personal history of nicotine dependence: Secondary | ICD-10-CM | POA: Diagnosis not present

## 2015-08-06 DIAGNOSIS — R109 Unspecified abdominal pain: Secondary | ICD-10-CM | POA: Diagnosis present

## 2015-08-06 DIAGNOSIS — Z3A16 16 weeks gestation of pregnancy: Secondary | ICD-10-CM | POA: Insufficient documentation

## 2015-08-06 DIAGNOSIS — O26899 Other specified pregnancy related conditions, unspecified trimester: Secondary | ICD-10-CM

## 2015-08-06 LAB — CBC
HCT: 32.8 % — ABNORMAL LOW (ref 36.0–46.0)
HEMOGLOBIN: 11 g/dL — AB (ref 12.0–15.0)
MCH: 30.9 pg (ref 26.0–34.0)
MCHC: 33.5 g/dL (ref 30.0–36.0)
MCV: 92.1 fL (ref 78.0–100.0)
Platelets: 157 10*3/uL (ref 150–400)
RBC: 3.56 MIL/uL — AB (ref 3.87–5.11)
RDW: 13.5 % (ref 11.5–15.5)
WBC: 8 10*3/uL (ref 4.0–10.5)

## 2015-08-06 LAB — RAPID URINE DRUG SCREEN, HOSP PERFORMED
Amphetamines: NOT DETECTED
BENZODIAZEPINES: NOT DETECTED
Barbiturates: NOT DETECTED
COCAINE: NOT DETECTED
Opiates: NOT DETECTED
Tetrahydrocannabinol: POSITIVE — AB

## 2015-08-06 LAB — URINALYSIS, ROUTINE W REFLEX MICROSCOPIC
Bilirubin Urine: NEGATIVE
Glucose, UA: NEGATIVE mg/dL
Hgb urine dipstick: NEGATIVE
KETONES UR: NEGATIVE mg/dL
LEUKOCYTES UA: NEGATIVE
NITRITE: NEGATIVE
PH: 7 (ref 5.0–8.0)
Protein, ur: NEGATIVE mg/dL
Specific Gravity, Urine: <1.005 — ABNORMAL LOW (ref 1.005–1.030)
Urobilinogen, UA: 2 mg/dL — ABNORMAL HIGH (ref 0.0–1.0)

## 2015-08-06 LAB — WET PREP, GENITAL
Trich, Wet Prep: NONE SEEN
Yeast Wet Prep HPF POC: NONE SEEN

## 2015-08-06 NOTE — Discharge Instructions (Signed)
Vaginal Bleeding During Pregnancy, Second Trimester °A small amount of bleeding (spotting) from the vagina is relatively common in pregnancy. It usually stops on its own. Various things can cause bleeding or spotting in pregnancy. Some bleeding may be related to the pregnancy, and some may not. Sometimes the bleeding is normal and is not a problem. However, bleeding can also be a sign of something serious. Be sure to tell your health care provider about any vaginal bleeding right away. °Some possible causes of vaginal bleeding during the second trimester include: °· Infection, inflammation, or growths on the cervix.   °· The placenta may be partially or completely covering the opening of the cervix inside the uterus (placenta previa). °· The placenta may have separated from the uterus (abruption of the placenta).   °· You may be having early (preterm) labor.   °· The cervix may not be strong enough to keep a baby inside the uterus (cervical insufficiency).   °· Tiny cysts may have developed in the uterus instead of pregnancy tissue (molar pregnancy).  °HOME CARE INSTRUCTIONS  °Watch your condition for any changes. The following actions may help to lessen any discomfort you are feeling: °· Follow your health care provider's instructions for limiting your activity. If your health care provider orders bed rest, you may need to stay in bed and only get up to use the bathroom. However, your health care provider may allow you to continue light activity. °· If needed, make plans for someone to help with your regular activities and responsibilities while you are on bed rest. °· Keep track of the number of pads you use each day, how often you change pads, and how soaked (saturated) they are. Write this down. °· Do not use tampons. Do not douche. °· Do not have sexual intercourse or orgasms until approved by your health care provider. °· If you pass any tissue from your vagina, save the tissue so you can show it to your  health care provider. °· Only take over-the-counter or prescription medicines as directed by your health care provider. °· Do not take aspirin because it can make you bleed. °· Do not exercise or perform any strenuous activities or heavy lifting without your health care provider's permission. °· Keep all follow-up appointments as directed by your health care provider. °SEEK MEDICAL CARE IF: °· You have any vaginal bleeding during any part of your pregnancy. °· You have cramps or labor pains. °· You have a fever, not controlled by medicine. °SEEK IMMEDIATE MEDICAL CARE IF:  °· You have severe cramps in your back or belly (abdomen). °· You have contractions. °· You have chills. °· You pass large clots or tissue from your vagina. °· Your bleeding increases. °· You feel light-headed or weak, or you have fainting episodes. °· You are leaking fluid or have a gush of fluid from your vagina. °MAKE SURE YOU: °· Understand these instructions. °· Will watch your condition. °· Will get help right away if you are not doing well or get worse. °  °This information is not intended to replace advice given to you by your health care provider. Make sure you discuss any questions you have with your health care provider. °  °Document Released: 07/17/2005 Document Revised: 10/12/2013 Document Reviewed: 06/14/2013 °Elsevier Interactive Patient Education ©2016 [employer] ° °Rh Incompatibility °Rh incompatibility is a condition that occurs during pregnancy if a woman has Rh-negative blood and her baby has Rh-positive blood. "Rh-negative" and "Rh-positive" refer to whether or not the blood has an   Rh factor. An Rh factor is a specific protein found on the surface of red blood cells. If a woman has Rh factor, she is Rh-positive. If she does not have an Rh factor, she is Rh-negative. Having or not having an Rh factor does not affect the mother's general health. However, it can cause problems during pregnancy.  °WHAT KIND OF PROBLEMS CAN Rh  INCOMPATIBILITY CAUSE? °During pregnancy, blood from the baby can cross into the mother's bloodstream, especially during delivery. If a mother is Rh-negative and the baby is Rh-positive, the mother's defense system will react to the baby's blood as if it was a foreign substance and will create proteins (antibodies). This is called sensitization. Once the mother is sensitized, her Rh antibodies will cross the placenta to the baby and attack the baby's Rh-positive blood as if it is a harmful substance.  °Rh incompatibility can also happen if the Rh-negative pregnant woman is exposed to the Rh factor during a blood transfusion with Rh-positive blood.  °HOW DOES THIS CONDITION AFFECT MY BABY? °The Rh antibodies that attack and destroy the baby's red blood cells can lead to hemolytic disease in the baby. Hemolytic disease is when the red blood cells break down. This can cause:  °· Yellowing of the skin and eyes (jaundice). °· The body to not have enough healthy red blood cells (anemia).   °· Brain damage.   °· Heart failure.   °· Death.   °These antibodies usually do not cause problems during a first pregnancy. This is because the blood from the baby often times crosses into the mother's bloodstream during delivery, and the baby is born before many of the antibodies can develop. However, the antibodies stay in your body once they have formed. Because of this, Rh incompatibility is more likely to cause problems in second or later pregnancies (if the baby is Rh-positive).  °HOW IS THIS CONDITION DIAGNOSED? °When a woman becomes pregnant, blood tests may be done to find out her blood type and Rh factor. If the woman is Rh-negative, she also may have another blood test called an antibody screen. The antibody screen shows whether she has Rh antibodies in her blood. If she does, it means she was exposed to Rh-positive blood before, and she is at risk for Rh incompatibility.   °To find out whether the baby is developing  hemolytic anemia and how serious it is, caregivers may use more advanced tests, such as ultrasonography (commonly known as ultrasound).  °HOW IS Rh INCOMPATIBILITY TREATED?  °Rh incompatibility is treated with a shot of medicine called Rho (D) immune globulin. This medicine keeps the woman's body from making antibodies that can cause serious problems in the baby or future babies.  °Two shots will be given, one at around your seventh month of pregnancy and the other within 72 hours of your baby being born. If you are Rh-negative, you will need this medicine every time you have a baby with Rh-positive blood. If you already have antibodies in your blood, Rho (D) immune globulin will not help. Your doctor will not give you this medicine, but will watch your pregnancy closely for problems instead.   °This shot may also be given to an Rh-negative woman when the risk of blood transfer between the mom and baby is high. The risk is high with:  °· An amniocentesis.   °· A miscarriage or an abortion.   °· An ectopic pregnancy.   °· Any vaginal bleeding during pregnancy.   °  °This information is not intended to replace advice given   to you by your health care provider. Make sure you discuss any questions you have with your health care provider. °  °Document Released: 03/29/2002 Document Revised: 10/12/2013 Document Reviewed: 01/19/2013 °Elsevier Interactive Patient Education ©2016 Elsevier Inc. ° ° °

## 2015-08-06 NOTE — MAU Note (Signed)
Pt. States she has had spotting for 3 days that comes and goes. Pt. States she has been having tightening of her abdomen that hurts since 2pm today. States this happens every 15mins. Last intercourse was a month ago. No new strenuous activities. Pt. States she does not drink a lot of water is trying to increase her daily amount. Pt. States she drinks a lot of juice. Pt. Denies Urinary symptoms. Pt. Was treated recently for BV and uti but has since finished all ordered antibiotics. Pt. States she had an ultrasound last Thursday and saw the midwife the week before. Pt. States [facility] November [facility]. Pt. States she had the ultrasound done and it was abnormal and baby is high risk for down syndrome so she had a quad screen done at that time as well.

## 2015-08-06 NOTE — MAU Provider Note (Signed)
Chief Complaint: No chief complaint on file.   First Provider Initiated Contact with Patient 08/06/15 2221      SUBJECTIVE HPI: Laurie [name] is a 19 y.o. G2P0010 at [redacted]w[redacted]d by LMP who presents to maternity admissions reporting spotting off and on x 3 days and abdominal cramping/contractions every 15 minutes.   She denies vaginal itching/burning, urinary symptoms, h/a, dizziness, n/v, or fever/chills.     Vaginal Bleeding The patient's primary symptoms include pelvic pain and vaginal bleeding. The patient's pertinent negatives include no vaginal discharge. This is a new problem. The current episode started in the past 7 days. The problem occurs intermittently. The problem has been unchanged. The pain is mild. She is pregnant. Associated symptoms include abdominal pain. Pertinent negatives include no back pain, chills, constipation, diarrhea, dysuria, fever, flank pain, frequency, headaches, nausea, urgency or vomiting. The vaginal bleeding is spotting. She has not been passing clots. She has not been passing tissue. Nothing aggravates the symptoms. She has tried nothing for the symptoms.  Abdominal Cramping This is a new problem. The current episode started in the past 7 days. The onset quality is gradual. The problem occurs intermittently. The most recent episode lasted 3 days. The problem has been unchanged. The pain is located in the LLQ and RLQ. The pain is mild. The quality of the pain is cramping. The abdominal pain does not radiate. Pertinent negatives include no constipation, diarrhea, dysuria, fever, frequency, headaches, nausea or vomiting. Nothing aggravates the pain. The pain is relieved by nothing. She has tried nothing for the symptoms.    Past Medical History  Diagnosis Date  . Chlamydia   . Pregnant   . History of heroin abuse   . Asthma     "grew out"  . Hepatitis C June 2016  . Infection     UTI  . Anxiety   . Depression     doing really good   Past Surgical History   Procedure Laterality Date  . No past surgeries     Social History   Social History  . Marital Status: Single    Spouse Name: N/A  . Number of Children: N/A  . Years of Education: N/A   Occupational History  . Not on file.   Social History Main Topics  . Smoking status: Former Smoker -- 1.00 packs/day    Types: Cigarettes  . Smokeless tobacco: Never Used     Comment: quit with + preg test  . Alcohol Use: No     Comment: Occas use.; none since mid Sept  . Drug Use: No     Comment: patient states Marijuana last use 07/13/2015 and last use of Heroin was July 2016.  Marland Kitchen Sexual Activity: Yes    Birth Control/ Protection: None     Comment: last intercourse last month 06/2015   Other Topics Concern  . Not on file   Social History Narrative   No current facility-administered medications on file prior to encounter.   Current Outpatient Prescriptions on File Prior to Encounter  Medication Sig Dispense Refill  . cephALEXin (KEFLEX) 500 MG capsule Take 1 capsule (500 mg total) by mouth 4 (four) times daily. 28 capsule 0  . metroNIDAZOLE (FLAGYL) 500 MG tablet Take 1 tablet (500 mg total) by mouth 2 (two) times daily. 14 tablet 0   Allergies  Allergen Reactions  . Penicillins Other (See Comments)    Results thru an allergy skin test Has patient had a PCN reaction causing immediate rash, facial/tongue/throat  swelling, SOB or lightheadedness with hypotension: No Has patient had a PCN reaction causing severe rash involving mucus membranes or skin necrosis: No Has patient had a PCN reaction that required hospitalization No Has patient had a PCN reaction occurring within the last 10 years: No If all of the above answers are "NO", then may proceed with Cephalosporin use.      ROS:  Review of Systems  Constitutional: Negative for fever, chills and fatigue.  HENT: Negative for sinus pressure.   Eyes: Negative for photophobia.  Respiratory: Negative for shortness of breath.    Cardiovascular: Negative for chest pain.  Gastrointestinal: Positive for abdominal pain. Negative for nausea, vomiting, diarrhea and constipation.  Genitourinary: Positive for vaginal bleeding and pelvic pain. Negative for dysuria, urgency, frequency, flank pain, vaginal discharge, difficulty urinating and vaginal pain.  Musculoskeletal: Negative for back pain and neck pain.  Neurological: Negative for dizziness, weakness and headaches.  Psychiatric/Behavioral: Negative.      I have reviewed patient's Past Medical Hx, Surgical Hx, Family Hx, Social Hx, medications and allergies.   Physical Exam   Patient Vitals for the past 24 hrs:  BP Temp Temp src Pulse Resp SpO2 Height Weight  08/06/15 2120 (!) 114/51 mmHg 98.7 F (37.1 C) Oral 85 18 100 %  (1.6 m) 53.978 kg (119 lb)   Constitutional: Well-developed, well-nourished female in no acute distress.  Cardiovascular: normal rate Respiratory: normal effort GI: Abd soft, non-tender. Pos BS x 4 MS: Extremities nontender, no edema, normal ROM Neurologic: Alert and oriented x 4.  GU: Neg CVAT.  PELVIC EXAM: Cervix pink, visually closed, without lesion, large amount yellow thick discharge, no vaginal bleeding noted, vaginal walls and external genitalia normal Cervix closed/thick/posterior, firm  FHT 157 by doppler  LAB RESULTS Results for orders placed or performed during the hospital encounter of 08/06/15 (from the past 24 hour(s))  Urinalysis, Routine w reflex microscopic (not at [facility])     Status: Abnormal   Collection Time: 08/06/15  9:55 PM  Result Value Ref Range   Color, Urine YELLOW YELLOW   APPearance CLEAR CLEAR   Specific Gravity, Urine <1.005 (L) 1.005 - 1.030   pH 7.0 5.0 - 8.0   Glucose, UA NEGATIVE NEGATIVE mg/dL   Hgb urine dipstick NEGATIVE NEGATIVE   Bilirubin Urine NEGATIVE NEGATIVE   Ketones, ur NEGATIVE NEGATIVE mg/dL   Protein, ur NEGATIVE NEGATIVE mg/dL   Urobilinogen, UA 2.0 (H) 0.0 - 1.0 mg/dL    Nitrite NEGATIVE NEGATIVE   Leukocytes, UA NEGATIVE NEGATIVE  Urine rapid drug screen (hosp performed)     Status: Abnormal   Collection Time: 08/06/15  9:55 PM  Result Value Ref Range   Opiates NONE DETECTED NONE DETECTED   Cocaine NONE DETECTED NONE DETECTED   Benzodiazepines NONE DETECTED NONE DETECTED   Amphetamines NONE DETECTED NONE DETECTED   Tetrahydrocannabinol POSITIVE (A) NONE DETECTED   Barbiturates NONE DETECTED NONE DETECTED  CBC     Status: Abnormal   Collection Time: 08/06/15 10:05 PM  Result Value Ref Range   WBC 8.0 4.0 - 10.5 K/uL   RBC 3.56 (L) 3.87 - 5.11 MIL/uL   Hemoglobin 11.0 (L) 12.0 - 15.0 g/dL   HCT 16.1 (L) 09.6 - 04.5 %   MCV 92.1 78.0 - 100.0 fL   MCH 30.9 26.0 - 34.0 pg   MCHC 33.5 30.0 - 36.0 g/dL   RDW 40.9 81.1 - 91.4 %   Platelets 157 150 - 400 K/uL  Rh IG  workup (includes ABO/Rh)     Status: None (Preliminary result)   Collection Time: 08/06/15 10:05 PM  Result Value Ref Range   Gestational Age(Wks) 15    ABO/RH(D) A NEG    Antibody Screen NEG    Unit Number [id number]    Blood Component Type RHIG    Unit division 00    Status of Unit ISSUED    Transfusion Status OK TO TRANSFUSE   Wet prep, genital     Status: Abnormal   Collection Time: 08/06/15 10:20 PM  Result Value Ref Range   Yeast Wet Prep HPF POC NONE SEEN NONE SEEN   Trich, Wet Prep NONE SEEN NONE SEEN   Clue Cells Wet Prep HPF POC FEW (A) NONE SEEN   WBC, Wet Prep HPF POC MODERATE (A) NONE SEEN    --/--/A NEG (10/16 2205)  IMAGING No results found.  MAU Management/MDM: Ordered labs and reviewed results.  Treatments in MAU included Rhophylac injection x 1.  Pt stable at time of discharge.  ASSESSMENT 1. Spotting affecting pregnancy in second trimester, antepartum   2. Abdominal pain affecting pregnancy, antepartum     PLAN Discharge home with bleeding precautions   Follow-up Information    Follow up with Roe Coombsachelle A Denney, CNM.   Specialty:  Certified Nurse  Midwife   Why:  As scheduled   Contact information:   [address] [place] [id number] [phone]       [facility] with [facility].   Why:  As needed for emergencies   Contact information:   [address] [id number] [place] [id number] [phone]      Sharen CounterLisa Leftwich-Kirby Certified Nurse-Midwife 08/06/2015  11:36 PM

## 2015-08-06 NOTE — MAU Note (Signed)
Pt reports "I am having contractions every 15 minutes" since 2 pm today. Some spotting off/on x 3 days. Denies dysuria.

## 2015-08-07 ENCOUNTER — Other Ambulatory Visit: Payer: Self-pay | Admitting: Certified Nurse Midwife

## 2015-08-07 LAB — GC/CHLAMYDIA PROBE AMP (~~LOC~~) NOT AT ARMC
CHLAMYDIA, DNA PROBE: NEGATIVE
NEISSERIA GONORRHEA: NEGATIVE

## 2015-08-07 LAB — RH IG WORKUP (INCLUDES ABO/RH)
ABO/RH(D): A NEG
Antibody Screen: NEGATIVE
GESTATIONAL AGE(WKS): 15
UNIT DIVISION: 0

## 2015-08-07 LAB — AFP, QUAD SCREEN
AFP: 35.3 ng/mL
Curr Gest Age: 16.1 wks.days
HCG, Total: 35.43 IU/mL
INH: 245.8 pg/mL
INTERPRETATION-AFP: NEGATIVE
MOM FOR AFP: 0.92
MoM for INH: 1.17
MoM for hCG: 0.83
OPEN SPINA BIFIDA: NEGATIVE
Osb Risk: 1:17800 {titer}
TRI 18 SCR RISK EST: NEGATIVE
UE3 VALUE: 1.35 ng/mL
uE3 Mom: 1.61

## 2015-08-15 ENCOUNTER — Telehealth: Payer: Self-pay | Admitting: *Deleted

## 2015-08-15 NOTE — Telephone Encounter (Signed)
Pt called to office for lab results. Return call to pt.  Pt states that she would like to know results of Quad Screen. Pt was made aware of results. Pt has no further concerns.

## 2015-08-25 ENCOUNTER — Encounter: Payer: Medicaid Other | Admitting: Certified Nurse Midwife

## 2015-08-29 ENCOUNTER — Encounter: Payer: Medicaid Other | Admitting: Certified Nurse Midwife

## 2015-08-29 ENCOUNTER — Encounter: Payer: Self-pay | Admitting: Obstetrics

## 2015-08-29 ENCOUNTER — Ambulatory Visit (INDEPENDENT_AMBULATORY_CARE_PROVIDER_SITE_OTHER): Payer: Medicaid Other | Admitting: Certified Nurse Midwife

## 2015-08-29 VITALS — BP 96/60 | HR 92 | Temp 98.2°F | Wt 123.8 lb

## 2015-08-29 DIAGNOSIS — Z3402 Encounter for supervision of normal first pregnancy, second trimester: Secondary | ICD-10-CM

## 2015-08-29 DIAGNOSIS — Z3492 Encounter for supervision of normal pregnancy, unspecified, second trimester: Secondary | ICD-10-CM

## 2015-08-29 DIAGNOSIS — Z3482 Encounter for supervision of other normal pregnancy, second trimester: Secondary | ICD-10-CM

## 2015-08-29 LAB — POCT URINALYSIS DIPSTICK
BILIRUBIN UA: NEGATIVE
GLUCOSE UA: NEGATIVE
Ketones, UA: NEGATIVE
Leukocytes, UA: NEGATIVE
NITRITE UA: NEGATIVE
PROTEIN UA: NEGATIVE
RBC UA: NEGATIVE
Spec Grav, UA: 1.02
Urobilinogen, UA: NEGATIVE
pH, UA: 6

## 2015-08-29 NOTE — Progress Notes (Signed)
Patient reports she has some spotting -rare- when wipes. Patient has some tightening at night.

## 2015-08-29 NOTE — Progress Notes (Signed)
Subjective:    Laurie [name] is a 19 y.o. female being seen today for her obstetrical visit. She is at 6920w6d gestation. Patient reports: no complaints . Fetal movement: normal.  States she did take her friends flexaril for pain, encouraged patient that we need to evaluate her if she is in that much pain.  Denies any bleeding after sexual intercourse.  Ultrasound ordered.  States that she has not had spotting since her last visit to MAU.    Problem List Items Addressed This Visit    None    Visit Diagnoses    Pregnancy, first, second trimester    -  Primary    Relevant Orders    POCT urinalysis dipstick    Supervision of normal pregnancy in second trimester        Relevant Orders    US OB Comp + 14 Wk      There are no active problems to display for this patient.  Objective:    BP 96/60 mmHg  Pulse 92  Temp(Src) 98.2 F (36.8 C)  Wt 123 lb 12.8 oz (56.155 kg)  LMP 04/21/2015 FHT: 150 BPM  Uterine Size: size equals dates     Assessment:    Pregnancy @ 2920w6d    Plan:    OBGCT: discussed. Signs and symptoms of preterm labor: discussed.  Labs, problem list reviewed and updated 2 hr GTT planned Follow up in 4 weeks.

## 2015-08-30 ENCOUNTER — Other Ambulatory Visit: Payer: Self-pay | Admitting: Certified Nurse Midwife

## 2015-08-31 ENCOUNTER — Inpatient Hospital Stay (HOSPITAL_COMMUNITY): Payer: Medicaid Other

## 2015-08-31 ENCOUNTER — Encounter (HOSPITAL_COMMUNITY): Payer: Self-pay | Admitting: *Deleted

## 2015-08-31 ENCOUNTER — Inpatient Hospital Stay (HOSPITAL_COMMUNITY)
Admission: EM | Admit: 2015-08-31 | Discharge: 2015-08-31 | Disposition: A | Discharge status: Home / Self Care | Payer: Medicaid Other | Source: Ambulatory Visit | Attending: Obstetrics | Admitting: Obstetrics

## 2015-08-31 ENCOUNTER — Telehealth: Payer: Self-pay | Admitting: *Deleted

## 2015-08-31 DIAGNOSIS — Z3A20 20 weeks gestation of pregnancy: Secondary | ICD-10-CM | POA: Insufficient documentation

## 2015-08-31 DIAGNOSIS — F1910 Other psychoactive substance abuse, uncomplicated: Secondary | ICD-10-CM | POA: Diagnosis present

## 2015-08-31 DIAGNOSIS — N898 Other specified noninflammatory disorders of vagina: Secondary | ICD-10-CM | POA: Diagnosis not present

## 2015-08-31 DIAGNOSIS — R102 Pelvic and perineal pain: Secondary | ICD-10-CM | POA: Diagnosis not present

## 2015-08-31 DIAGNOSIS — O4702 False labor before 37 completed weeks of gestation, second trimester: Secondary | ICD-10-CM | POA: Diagnosis not present

## 2015-08-31 DIAGNOSIS — N949 Unspecified condition associated with female genital organs and menstrual cycle: Secondary | ICD-10-CM

## 2015-08-31 DIAGNOSIS — O26892 Other specified pregnancy related conditions, second trimester: Secondary | ICD-10-CM | POA: Diagnosis not present

## 2015-08-31 LAB — WET PREP, GENITAL
CLUE CELLS WET PREP: NONE SEEN
Trich, Wet Prep: NONE SEEN
Yeast Wet Prep HPF POC: NONE SEEN

## 2015-08-31 LAB — URINALYSIS, ROUTINE W REFLEX MICROSCOPIC
BILIRUBIN URINE: NEGATIVE
GLUCOSE, UA: NEGATIVE mg/dL
Hgb urine dipstick: NEGATIVE
KETONES UR: NEGATIVE mg/dL
Nitrite: NEGATIVE
PH: 7 (ref 5.0–8.0)
PROTEIN: NEGATIVE mg/dL
Specific Gravity, Urine: 1.01 (ref 1.005–1.030)
Urobilinogen, UA: 2 mg/dL — ABNORMAL HIGH (ref 0.0–1.0)

## 2015-08-31 LAB — URINE MICROSCOPIC-ADD ON

## 2015-08-31 LAB — POCT FERN TEST: POCT Fern Test: NEGATIVE

## 2015-08-31 NOTE — Telephone Encounter (Signed)
Patient states she has not felt her baby move since last night. She is having contractions. 5:30 Patient states she has been having contractions and now has sharp pain. Told patient her baby is still small- but if she has drank cold drink,has eaten, and rested and felt nothing- she needs to go get checked at MAU. She may be fine, but she is very worried and she needs piece of mind. Discussed ligament pain and the symptom of that and she has decided she needs to get checked.

## 2015-08-31 NOTE — MAU Provider Note (Signed)
Chief Complaint: Decreased Fetal Movement and Rupture of Membranes  First Provider Initiated Contact with Patient 08/31/15 1917     SUBJECTIVE HPI: Laurie [name] is a 19 y.o. G2P0010 at [redacted]w[redacted]d who presents to Maternity Admissions reporting contractions every 10 minutes since yesterday, pelvic pressure "as if I have to push", gush of thin, white fluid immediately before coming to maternity admissions and no fetal movement since last night. His prenatal care at [facility]. Concerned because they didn't check her cervix at her last visit 2 days ago.  Location: Low abdomen Quality: Tightening, pressure Severity: 10/10 on pain scale Duration: 24 hours, but worse over the last hour Context: None Timing: Intermittent Modifying factors: Nothing. Hasn't tried anything to treat the pain. Associated signs and symptoms: Negative for fever, chills, vaginal bleeding, GI complaints or urinary complaints.  Past Medical History  Diagnosis Date  . Chlamydia   . Pregnant   . History of heroin abuse   . Asthma     "grew out"  . Hepatitis C June 2016  . Infection     UTI  . Anxiety   . Depression     doing really good   OB History  Gravida Para Term Preterm AB SAB TAB Ectopic Multiple Living     # Outcome Date GA Lbr Len/2nd Weight Sex Delivery Anes PTL Lv  2 Current           1 SAB              Past Surgical History  Procedure Laterality Date  . No past surgeries     Social History   Social History  . Marital Status: Single    Spouse Name: N/A  . Number of Children: N/A  . Years of Education: N/A   Occupational History  . Not on file.   Social History Main Topics  . Smoking status: Former Smoker -- 1.00 packs/day    Types: Cigarettes  . Smokeless tobacco: Never Used     Comment: quit with + preg test  . Alcohol Use: No     Comment: Occas use.; none since mid Sept  . Drug Use: No     Comment: patient states Marijuana last use 07/13/2015 and last  use of Heroin was July 2016.  Marland Kitchen Sexual Activity: Yes    Birth Control/ Protection: None     Comment: last intercourse last month 06/2015   Other Topics Concern  . Not on file   Social History Narrative   No current facility-administered medications on file prior to encounter.   Current Outpatient Prescriptions on File Prior to Encounter  Medication Sig Dispense Refill  . Prenat w/o A-FeCbn-DSS-FA-DHA (CITRANATAL HARMONY) 30-1-260 MG CAPS Take 1 tablet by mouth at bedtime. (Patient taking differently: Take 1 tablet by mouth daily. ) 30 capsule 12   Allergies  Allergen Reactions  . Penicillins Other (See Comments)    Results thru an allergy skin test Has patient had a PCN reaction causing immediate rash, facial/tongue/throat swelling, SOB or lightheadedness with hypotension: No Has patient had a PCN reaction causing severe rash involving mucus membranes or skin necrosis: No Has patient had a PCN reaction that required hospitalization No Has patient had a PCN reaction occurring within the last 10 years: No If all of the above answers are "NO", then may proceed with Cephalosporin use.      I have reviewed the past Medical Hx, Surgical Hx,  Social Hx, Allergies and Medications.   Review of Systems  Constitutional: Negative for fever and chills.  Gastrointestinal: Positive for abdominal pain and diarrhea. Negative for nausea, vomiting and constipation.  Genitourinary: Positive for vaginal discharge and pelvic pain. Negative for dysuria, frequency, hematuria, flank pain, decreased urine volume and vaginal bleeding.       Leaking of thin white fluid    OBJECTIVE Patient Vitals for the past 24 hrs:  BP Temp Temp src Pulse Resp  08/31/15 1853 114/72 mmHg - - 83 -  08/31/15 1831 (!) 103/39 mmHg 98 F (36.7 C) Oral 89 18   Constitutional: Well-developed, well-nourished female in mild distress. Very anxious. Cardiovascular: normal rate Respiratory: normal rate and effort.  GI: Abd  soft, non-tender, gravid appropriate for gestational age.  Neurologic: Alert and oriented x 4.  GU: Neg CVAT.  SPECULUM EXAM: NEFG, moderate amount of thin, white, mildly malodorous discharge. Neg pooling, no blood noted, cervix clean  BIMANUAL: cervix long, closed, firm, posterior; uterus 20-week size, no adnexal tenderness or masses. No CMT.  Fetal heart rate 144 by Doppler.  LAB RESULTS Results for orders placed or performed during the hospital encounter of 08/31/15 (from the past 24 hour(s))  Urinalysis, Routine w reflex microscopic (not at [facility])     Status: Abnormal   Collection Time: 08/31/15  6:40 PM  Result Value Ref Range   Color, Urine YELLOW YELLOW   APPearance CLEAR CLEAR   Specific Gravity, Urine 1.010 1.005 - 1.030   pH 7.0 5.0 - 8.0   Glucose, UA NEGATIVE NEGATIVE mg/dL   Hgb urine dipstick NEGATIVE NEGATIVE   Bilirubin Urine NEGATIVE NEGATIVE   Ketones, ur NEGATIVE NEGATIVE mg/dL   Protein, ur NEGATIVE NEGATIVE mg/dL   Urobilinogen, UA 2.0 (H) 0.0 - 1.0 mg/dL   Nitrite NEGATIVE NEGATIVE   Leukocytes, UA MODERATE (A) NEGATIVE  Urine microscopic-add on     Status: Abnormal   Collection Time: 08/31/15  6:40 PM  Result Value Ref Range   Squamous Epithelial / LPF FEW (A) RARE   WBC, UA 0-2 <3 WBC/hpf   RBC / HPF 0-2 <3 RBC/hpf  Wet prep, genital     Status: Abnormal   Collection Time: 08/31/15  7:28 PM  Result Value Ref Range   Yeast Wet Prep HPF POC NONE SEEN NONE SEEN   Trich, Wet Prep NONE SEEN NONE SEEN   Clue Cells Wet Prep HPF POC NONE SEEN NONE SEEN   WBC, Wet Prep HPF POC MODERATE (A) NONE SEEN  POCT fern test     Status: None   Collection Time: 08/31/15  7:37 PM  Result Value Ref Range   POCT Fern Test Negative = intact amniotic membranes     IMAGING Cervical length 3.71. Fluid subjectively normal. Cephalic.  MAU COURSE Fern, UA, wet prep, GC/chlamydia cultures, transvaginal ultrasound for cervical length.  MDM 19 year old female at 20 weeks 1  day gestation with preterm contractions and vaginal discharge consistent with leukorrhea. No evidence of ruptured membranes or active preterm labor.  ASSESSMENT 1. Preterm contractions, second trimester   2. Pelvic pressure in pregnancy, antepartum, second trimester   3. Leukorrhea    PLAN Discharge home in stable condition Preterm labor Precautions Increase fluids and rest.     Follow-up Information    Follow up with [facility] On 09/26/2015.   Why:  Routine prenatal visit or sooner as needed if symptoms worsen   Contact information:   [address]  [place] [id number] [phone]      Follow [facility] with [facility].   Why:  As needed in emergencies   Contact information:   [address] [id number] mc [place] [id number] [phone]       Medication List    TAKE these medications        CITRANATAL HARMONY 30-1-260 MG Caps  Take 1 tablet by mouth at bedtime.         [place] Al[place] Rake, [place] 08/31/2015  8:37 PM

## 2015-08-31 NOTE — Discharge Instructions (Signed)

## 2015-08-31 NOTE — MAU Note (Signed)
No FM since last night, started having uc's about an hour ago.  Feeling a lot of pressure, had gush of clear/white fluid.  Denies bleeding.

## 2015-09-01 ENCOUNTER — Other Ambulatory Visit: Payer: Self-pay | Admitting: Certified Nurse Midwife

## 2015-09-01 ENCOUNTER — Ambulatory Visit (HOSPITAL_COMMUNITY)
Admission: RE | Admit: 2015-09-01 | Discharge: 2015-09-01 | Disposition: A | Discharge status: Home / Self Care | Payer: Medicaid Other | Source: Ambulatory Visit | Attending: Certified Nurse Midwife | Admitting: Certified Nurse Midwife

## 2015-09-01 DIAGNOSIS — O99332 Smoking (tobacco) complicating pregnancy, second trimester: Secondary | ICD-10-CM

## 2015-09-01 DIAGNOSIS — Z3A20 20 weeks gestation of pregnancy: Secondary | ICD-10-CM

## 2015-09-01 DIAGNOSIS — O99320 Drug use complicating pregnancy, unspecified trimester: Secondary | ICD-10-CM

## 2015-09-01 DIAGNOSIS — Z36 Encounter for antenatal screening of mother: Secondary | ICD-10-CM | POA: Insufficient documentation

## 2015-09-01 DIAGNOSIS — O358XX0 Maternal care for other (suspected) fetal abnormality and damage, not applicable or unspecified: Secondary | ICD-10-CM | POA: Diagnosis not present

## 2015-09-01 DIAGNOSIS — B182 Chronic viral hepatitis C: Secondary | ICD-10-CM

## 2015-09-01 DIAGNOSIS — Z3689 Encounter for other specified antenatal screening: Secondary | ICD-10-CM

## 2015-09-01 DIAGNOSIS — O359XX0 Maternal care for (suspected) fetal abnormality and damage, unspecified, not applicable or unspecified: Secondary | ICD-10-CM

## 2015-09-01 DIAGNOSIS — O98419 Viral hepatitis complicating pregnancy, unspecified trimester: Secondary | ICD-10-CM

## 2015-09-01 DIAGNOSIS — O99322 Drug use complicating pregnancy, second trimester: Secondary | ICD-10-CM | POA: Diagnosis not present

## 2015-09-01 DIAGNOSIS — F1910 Other psychoactive substance abuse, uncomplicated: Secondary | ICD-10-CM

## 2015-09-01 LAB — GC/CHLAMYDIA PROBE AMP (~~LOC~~) NOT AT ARMC
Chlamydia: NEGATIVE
NEISSERIA GONORRHEA: NEGATIVE

## 2015-09-06 ENCOUNTER — Other Ambulatory Visit: Payer: Self-pay | Admitting: Certified Nurse Midwife

## 2015-09-11 ENCOUNTER — Telehealth (HOSPITAL_COMMUNITY): Payer: Self-pay | Admitting: MS"

## 2015-09-11 NOTE — Telephone Encounter (Signed)
Attempted to call patient regarding normal cell free DNA testing results (Panorama). Patient did not answer and no voicemail available to leave message.   Clydie BraunKaren Corra Kaine 09/11/2015 9:52 AM

## 2015-09-11 NOTE — Telephone Encounter (Signed)
Called Laurie [name] to discuss her prenatal cell free DNA test results.  Ms. Laurie [name] had Panorama testing through [facility].  Testing was offered because of ultrasound finding of hypoplastic nasal bone.   The patient was identified by name and DOB.  We reviewed that these are within normal limits, showing a less than 1 in 10,000 risk for trisomies 21, 18 and 13, and monosomy X (Turner syndrome).  In addition, the risk for triploidy/vanishing twin and sex chromosome trisomies (47,XXX and 47,XXY) was also low risk.  We reviewed that this testing identifies > 99% of pregnancies with trisomy 4421, trisomy 213, sex chromosome trisomies (47,XXX and 47,XXY), and triploidy. The detection rate for trisomy 18 is 96%.  The detection rate for monosomy X is ~92%.  The false positive rate is <0.1% for all conditions. Testing was also consistent with female fetal sex. She understands that this testing does not identify all genetic conditions.  All questions were answered to her satisfaction, she was encouraged to call with additional questions or concerns.  Quinn PlowmanKaren Melyssa Signor, MS Patent attorneyCertified Genetic Counselor

## 2015-09-13 ENCOUNTER — Other Ambulatory Visit (HOSPITAL_COMMUNITY): Payer: Self-pay

## 2015-09-16 ENCOUNTER — Encounter (HOSPITAL_COMMUNITY): Payer: Self-pay

## 2015-09-22 ENCOUNTER — Other Ambulatory Visit: Payer: Self-pay | Admitting: Certified Nurse Midwife

## 2015-09-26 ENCOUNTER — Encounter: Payer: Medicaid Other | Admitting: Certified Nurse Midwife

## 2015-09-26 ENCOUNTER — Ambulatory Visit (INDEPENDENT_AMBULATORY_CARE_PROVIDER_SITE_OTHER): Payer: Medicaid Other | Admitting: Certified Nurse Midwife

## 2015-09-26 VITALS — BP 122/61 | HR 84 | Temp 98.3°F | Wt 134.0 lb

## 2015-09-26 DIAGNOSIS — Z3402 Encounter for supervision of normal first pregnancy, second trimester: Secondary | ICD-10-CM

## 2015-09-26 LAB — POCT URINALYSIS DIPSTICK
Bilirubin, UA: NEGATIVE
Ketones, UA: NEGATIVE
Leukocytes, UA: NEGATIVE
NITRITE UA: NEGATIVE
Protein, UA: NEGATIVE
RBC UA: NEGATIVE
Spec Grav, UA: 1.015
UROBILINOGEN UA: NEGATIVE
pH, UA: 6

## 2015-09-26 NOTE — Progress Notes (Signed)
Subjective:    Laurie [name] is a 19 y.o. female being seen today for her obstetrical visit. She is at 6339w6d gestation. Patient reports: backache, no bleeding, no contractions, no cramping and no leaking . Fetal movement: normal.  Reports smoking both cigarettes and mariajuana.  Discussed the fetal implications of those choices and encouraged the patient to stop smoking.  Encouragement given.  Discussed future birth plans at length.     Problem List Items Addressed This Visit    None    Visit Diagnoses    Encounter for supervision of normal first pregnancy in second trimester    -  Primary    Relevant Orders    POCT urinalysis dipstick (Completed)      Patient Active Problem List   Diagnosis Date Noted  . Polysubstance abuse 08/31/2015   Objective:    BP 122/61 mmHg  Pulse 84  Temp(Src) 98.3 F (36.8 C)  Wt 134 lb (60.782 kg)  LMP 04/21/2015 FHT: 155 BPM  Uterine Size: size equals dates     Assessment:    Pregnancy @ 7739w6d    Tobacco and mariajuana use this pregnancy  RH negative status Plan:    OBGCT: discussed and ordered for next visit. Signs and symptoms of preterm labor: discussed.  Labs, problem list reviewed and updated 2 hr GTT planned Follow up in 4 weeks.

## 2015-10-11 ENCOUNTER — Inpatient Hospital Stay (HOSPITAL_COMMUNITY)
Admission: AD | Admit: 2015-10-11 | Discharge: 2015-10-12 | Disposition: A | Discharge status: Home / Self Care | Payer: Medicaid Other | Source: Ambulatory Visit | Attending: Obstetrics | Admitting: Obstetrics

## 2015-10-11 ENCOUNTER — Inpatient Hospital Stay (HOSPITAL_COMMUNITY): Payer: Medicaid Other

## 2015-10-11 ENCOUNTER — Encounter (HOSPITAL_COMMUNITY): Payer: Self-pay | Admitting: *Deleted

## 2015-10-11 DIAGNOSIS — O4692 Antepartum hemorrhage, unspecified, second trimester: Secondary | ICD-10-CM | POA: Diagnosis not present

## 2015-10-11 DIAGNOSIS — Z87891 Personal history of nicotine dependence: Secondary | ICD-10-CM | POA: Insufficient documentation

## 2015-10-11 DIAGNOSIS — Z880 Allergy status to penicillin: Secondary | ICD-10-CM | POA: Diagnosis not present

## 2015-10-11 DIAGNOSIS — Z3A26 26 weeks gestation of pregnancy: Secondary | ICD-10-CM | POA: Diagnosis not present

## 2015-10-11 LAB — WET PREP, GENITAL
CLUE CELLS WET PREP: NONE SEEN
SPERM: NONE SEEN
TRICH WET PREP: NONE SEEN
YEAST WET PREP: NONE SEEN

## 2015-10-11 LAB — URINE MICROSCOPIC-ADD ON

## 2015-10-11 LAB — URINALYSIS, ROUTINE W REFLEX MICROSCOPIC
Bilirubin Urine: NEGATIVE
Glucose, UA: NEGATIVE mg/dL
Hgb urine dipstick: NEGATIVE
KETONES UR: NEGATIVE mg/dL
NITRITE: NEGATIVE
PH: 7.5 (ref 5.0–8.0)
Protein, ur: NEGATIVE mg/dL
SPECIFIC GRAVITY, URINE: 1.01 (ref 1.005–1.030)

## 2015-10-11 LAB — RAPID URINE DRUG SCREEN, HOSP PERFORMED
AMPHETAMINES: NOT DETECTED
Barbiturates: NOT DETECTED
Benzodiazepines: NOT DETECTED
Cocaine: NOT DETECTED
OPIATES: NOT DETECTED
Tetrahydrocannabinol: NOT DETECTED

## 2015-10-11 LAB — CBC
HCT: 33.3 % — ABNORMAL LOW (ref 36.0–46.0)
Hemoglobin: 11.4 g/dL — ABNORMAL LOW (ref 12.0–15.0)
MCH: 31.7 pg (ref 26.0–34.0)
MCHC: 34.2 g/dL (ref 30.0–36.0)
MCV: 92.5 fL (ref 78.0–100.0)
PLATELETS: 180 10*3/uL (ref 150–400)
RBC: 3.6 MIL/uL — AB (ref 3.87–5.11)
RDW: 12.5 % (ref 11.5–15.5)
WBC: 8.9 10*3/uL (ref 4.0–10.5)

## 2015-10-11 MED ORDER — RHO D IMMUNE GLOBULIN 1500 UNIT/2ML IJ SOSY
300.0000 ug | PREFILLED_SYRINGE | Freq: Once | INTRAMUSCULAR | Status: AC
  Administered 2015-10-12: 300 ug via INTRAMUSCULAR
  Filled 2015-10-11: qty 2

## 2015-10-11 NOTE — MAU Note (Signed)
Pt reports some vaginal bleeding since 45 minutes, some contractions.

## 2015-10-11 NOTE — Discharge Instructions (Signed)

## 2015-10-11 NOTE — MAU Provider Note (Signed)
History     CSN: [id number]  Arrival date and time: 10/11/15 2118   First Provider Initiated Contact with Patient 10/11/15 2155      Chief Complaint  Patient presents with  . Vaginal Bleeding   HPI Comments: Laurie [name] is a 19 y.o. G2P0010 at [redacted]w[redacted]d who presents today with vaginal bleeding. She states that about 45 mins ago she had a large gush of blood. She reports some intermittent abdominal pain as well. The fetus has been active. She states that she had a similar episode at 18 weeks, and had rhogam. She states that no cause of the bleeding was found.   Vaginal Bleeding The patient's primary symptoms include pelvic pain and vaginal bleeding. This is a new problem. The current episode started today. The problem occurs intermittently. The problem has been gradually worsening. The pain is severe. The problem affects both sides. She is pregnant. Associated symptoms include abdominal pain. Pertinent negatives include no chills, constipation, diarrhea, dysuria, fever, frequency, nausea, urgency or vomiting. The vaginal discharge was bloody. The vaginal bleeding is typical of menses. She has not been passing clots. She has not been passing tissue. Nothing aggravates the symptoms. She has tried nothing for the symptoms. Sexual activity: no recent intercourse     Past Medical History  Diagnosis Date  . Chlamydia   . Pregnant   . History of heroin abuse   . Asthma     "grew out"  . Hepatitis C June 2016  . Infection     UTI  . Anxiety   . Depression     doing really good    Past Surgical History  Procedure Laterality Date  . No past surgeries      Family History  Problem Relation Age of Onset  . Diabetes Mother   . Pulmonary embolism Mother   . Stroke Mother   . Epilepsy Brother   . Cancer Paternal Grandmother     liver    Social History  Substance Use Topics  . Smoking status: Former Smoker -- 1.00 packs/day    Types: Cigarettes  . Smokeless tobacco: Never Used      Comment: quit with + preg test  . Alcohol Use: No     Comment: Occas use.; none since mid Sept    Allergies:  Allergies  Allergen Reactions  . Penicillins Other (See Comments)    Results thru an allergy skin test Has patient had a PCN reaction causing immediate rash, facial/tongue/throat swelling, SOB or lightheadedness with hypotension: No Has patient had a PCN reaction causing severe rash involving mucus membranes or skin necrosis: No Has patient had a PCN reaction that required hospitalization No Has patient had a PCN reaction occurring within the last 10 years: No If all of the above answers are "NO", then may proceed with Cephalosporin use.      Prescriptions prior to admission  Medication Sig Dispense Refill Last Dose  . Prenat w/o A-FeCbn-DSS-FA-DHA (CITRANATAL HARMONY) 30-1-260 MG CAPS Take 1 tablet by mouth at bedtime. (Patient taking differently: Take 1 tablet by mouth daily. ) 30 capsule 12 Taking    Review of Systems  Constitutional: Negative for fever and chills.  Gastrointestinal: Positive for abdominal pain. Negative for nausea, vomiting, diarrhea and constipation.  Genitourinary: Positive for vaginal bleeding and pelvic pain. Negative for dysuria, urgency and frequency.   Physical Exam   Blood pressure 131/56, pulse 86, temperature 98.4 F (36.9 C), temperature source Oral, resp. rate 16, height  (1.6  m), weight 63.504 kg (140 lb), last menstrual period 04/21/2015, SpO2 100 %, unknown if currently breastfeeding.  Physical Exam  Nursing note and vitals reviewed. Constitutional: She is oriented to person, place, and time. She appears well-developed and well-nourished. No distress.  HENT:  Head: Normocephalic.  Cardiovascular: Normal rate.   Respiratory: Effort normal.  GI: There is no tenderness. There is no rebound.  Genitourinary:   External: no lesion Vagina: small amount of white discharge. No blood seen  Cervix: pink, smooth, closed/thick/high   Uterus: AGA    Neurological: She is alert and oriented to person, place, and time.  Skin: Skin is warm and dry.  Psychiatric: She has a normal mood and affect.   FHT 140, moderate with 15x15 accels, random variable, visibly active fetus Toco: no UCs   US: no previa or abruption seen. Cervical length 3.3cm  Results for orders placed or performed during the hospital encounter of 10/11/15 (from the past 24 hour(s))  Urinalysis, Routine w reflex microscopic ([facility])     Status: Abnormal   Collection Time: 10/11/15  9:24 PM  Result Value Ref Range   Color, Urine YELLOW YELLOW   APPearance CLEAR CLEAR   Specific Gravity, Urine 1.010 1.005 - 1.030   pH 7.5 5.0 - 8.0   Glucose, UA NEGATIVE NEGATIVE mg/dL   Hgb urine dipstick NEGATIVE NEGATIVE   Bilirubin Urine NEGATIVE NEGATIVE   Ketones, ur NEGATIVE NEGATIVE mg/dL   Protein, ur NEGATIVE NEGATIVE mg/dL   Nitrite NEGATIVE NEGATIVE   Leukocytes, UA LARGE (A) NEGATIVE  Urine microscopic-add on     Status: Abnormal   Collection Time: 10/11/15  9:24 PM  Result Value Ref Range   Squamous Epithelial / LPF 0-5 (A) NONE SEEN   WBC, UA 6-30 0 - 5 WBC/hpf   RBC / HPF 0-5 0 - 5 RBC/hpf   Bacteria, UA MANY (A) NONE SEEN  Wet prep, genital     Status: Abnormal   Collection Time: 10/11/15 10:05 PM  Result Value Ref Range   Yeast Wet Prep HPF POC NONE SEEN NONE SEEN   Trich, Wet Prep NONE SEEN NONE SEEN   Clue Cells Wet Prep HPF POC NONE SEEN NONE SEEN   WBC, Wet Prep HPF POC MANY (A) NONE SEEN   Sperm NONE SEEN   CBC     Status: Abnormal   Collection Time: 10/11/15 10:10 PM  Result Value Ref Range   WBC 8.9 4.0 - 10.5 K/uL   RBC 3.60 (L) 3.87 - 5.11 MIL/uL   Hemoglobin 11.4 (L) 12.0 - 15.0 g/dL   HCT 16.133.3 (L) 09.636.0 - 04.546.0 %   MCV 92.5 78.0 - 100.0 fL   MCH 31.7 26.0 - 34.0 pg   MCHC 34.2 30.0 - 36.0 g/dL   RDW 40.912.5 81.111.5 - 91.415.5 %   Platelets 180 150 - 400 K/uL    MAU Course  Procedures  MDM Rhogam given today   Assessment  and Plan   1. Vaginal bleeding in pregnancy, second trimester    No active bleeding seen today Rhogam given today   DC home Comfort measures reviewed  3rd Trimester precautions  Bleeding precautions PTL precautions  Fetal kick counts RX: none  Return to MAU as needed FU with OB as planned  Follow-up Information    Follow up with HARPER,CHARLES A, MD.   Specialty:  Obstetrics and Gynecology   Why:  As scheduled   Contact information:   [address] [place] [address] [phone]         Tawnya Crook 10/11/2015, 9:56 PM

## 2015-10-13 LAB — RH IG WORKUP (INCLUDES ABO/RH)
ABO/RH(D): A NEG
ANTIBODY SCREEN: POSITIVE
DAT, IGG: NEGATIVE
FETAL SCREEN: NEGATIVE
Gestational Age(Wks): 26
UNIT DIVISION: 0

## 2015-10-24 ENCOUNTER — Other Ambulatory Visit: Payer: Medicaid Other

## 2015-10-24 ENCOUNTER — Ambulatory Visit (INDEPENDENT_AMBULATORY_CARE_PROVIDER_SITE_OTHER): Payer: Medicaid Other | Admitting: Certified Nurse Midwife

## 2015-10-24 VITALS — BP 95/64 | HR 83 | Temp 98.7°F | Wt 138.0 lb

## 2015-10-24 DIAGNOSIS — Z3402 Encounter for supervision of normal first pregnancy, second trimester: Secondary | ICD-10-CM

## 2015-10-24 LAB — POCT URINALYSIS DIPSTICK
Bilirubin, UA: NEGATIVE
Blood, UA: NEGATIVE
GLUCOSE UA: NEGATIVE
KETONES UA: NEGATIVE
Nitrite, UA: NEGATIVE
PROTEIN UA: NEGATIVE
Spec Grav, UA: 1.015
Urobilinogen, UA: NEGATIVE
pH, UA: 7

## 2015-10-24 NOTE — Progress Notes (Signed)
Subjective:    Laurie [name] is a 20 y.o. female being seen today for her obstetrical visit. She is at 2863w6d gestation. Patient reports: no complaints . Fetal movement: normal.  Patient ate sugar this AM, OGTT rescheduled for next Tuesday.    Problem List Items Addressed This Visit    None    Visit Diagnoses    Encounter for supervision of normal first pregnancy in second trimester    -  Primary    Relevant Orders    POCT urinalysis dipstick (Completed)      Patient Active Problem List   Diagnosis Date Noted  . Polysubstance abuse 08/31/2015   Objective:    BP 95/64 mmHg  Pulse 83  Temp(Src) 98.7 F (37.1 C)  Wt 138 lb (62.596 kg)  LMP 04/21/2015 FHT: 145 BPM  Uterine Size: size equals dates     Assessment:    Pregnancy @ 4163w6d    Doing well.   Plan:    OBGCT: discussed and ordered. Signs and symptoms of preterm labor: discussed.  Labs, problem list reviewed and updated 2 hr GTT planned for Tuesday Follow up in 1 weeks.

## 2015-10-31 ENCOUNTER — Other Ambulatory Visit: Payer: Self-pay | Admitting: Certified Nurse Midwife

## 2015-10-31 ENCOUNTER — Other Ambulatory Visit: Payer: Medicaid Other

## 2015-10-31 ENCOUNTER — Encounter: Payer: Medicaid Other | Admitting: Certified Nurse Midwife

## 2015-11-01 ENCOUNTER — Telehealth: Payer: Self-pay | Admitting: Certified Nurse Midwife

## 2015-11-08 NOTE — Telephone Encounter (Signed)
11/08/2015 - Email sent to patient in an efforst to contact her and reschedule missed appts. brm

## 2015-11-21 ENCOUNTER — Other Ambulatory Visit: Payer: Medicaid Other

## 2015-11-21 ENCOUNTER — Encounter: Payer: Self-pay | Admitting: Certified Nurse Midwife

## 2015-11-28 ENCOUNTER — Ambulatory Visit (INDEPENDENT_AMBULATORY_CARE_PROVIDER_SITE_OTHER): Payer: Medicaid Other | Admitting: Certified Nurse Midwife

## 2015-11-28 ENCOUNTER — Other Ambulatory Visit: Payer: Medicaid Other

## 2015-11-28 VITALS — BP 105/71 | HR 71 | Wt 147.0 lb

## 2015-11-28 DIAGNOSIS — Z3403 Encounter for supervision of normal first pregnancy, third trimester: Secondary | ICD-10-CM

## 2015-11-28 LAB — POCT URINALYSIS DIPSTICK
Bilirubin, UA: NEGATIVE
Blood, UA: NEGATIVE
GLUCOSE UA: NEGATIVE
Ketones, UA: NEGATIVE
LEUKOCYTES UA: NEGATIVE
NITRITE UA: NEGATIVE
Protein, UA: NEGATIVE
Spec Grav, UA: 1.015
UROBILINOGEN UA: NEGATIVE
pH, UA: 7

## 2015-11-28 LAB — CBC
HCT: 32 % — ABNORMAL LOW (ref 36.0–46.0)
HEMOGLOBIN: 10.5 g/dL — AB (ref 12.0–15.0)
MCH: 29.7 pg (ref 26.0–34.0)
MCHC: 32.8 g/dL (ref 30.0–36.0)
MCV: 90.4 fL (ref 78.0–100.0)
MPV: 12.1 fL (ref 8.6–12.4)
PLATELETS: 190 10*3/uL (ref 150–400)
RBC: 3.54 MIL/uL — AB (ref 3.87–5.11)
RDW: 12.3 % (ref 11.5–15.5)
WBC: 8.5 10*3/uL (ref 4.0–10.5)

## 2015-11-28 LAB — FETAL FIBRONECTIN: FETAL FIBRONECTIN: POSITIVE — AB

## 2015-11-28 NOTE — Progress Notes (Signed)
Pt states that she has lost her mucous plug.  Pt is having some cramping, muscle pain.

## 2015-11-28 NOTE — Progress Notes (Signed)
Subjective:    Laurie [name] is a 20 y.o. female being seen today for her obstetrical visit. She is at [redacted]w[redacted]d gestation. Patient reports no bleeding, no cramping, no leaking and vaginal pressure. Fetal movement: normal.  Reports lots of stressors at home, asked if we could help and patient declined help.  Encouraged patient to keep regularly scheduled appointments.    Problem List Items Addressed This Visit    None    Visit Diagnoses    Encounter for supervision of normal first pregnancy in third trimester    -  Primary    Relevant Orders    Glucose Tolerance, 2 Hours w/1 Hour    CBC    HIV antibody    RPR    POCT urinalysis dipstick (Completed)    SureSwab, Vaginosis/Vaginitis Plus    Fetal fibronectin      Patient Active Problem List   Diagnosis Date Noted  . Polysubstance abuse 08/31/2015   Objective:    BP 105/71 mmHg  Pulse 71  Wt 147 lb (66.679 kg)  LMP 04/21/2015 FHT:  145 BPM  Uterine Size: size equals dates  Presentation: unsure   Negative Nitrazine paper.  Cervix: long, thick and closed.    Assessment:    Pregnancy @ [redacted]w[redacted]d weeks   Limited prenatal care Plan:     labs reviewed, problem list updated Consent signed. GBS planning TDAP offered  Rhogam given for RH negative Pediatrician: discussed. Infant feeding: plans to breastfeed. Maternity leave: discussed. Cigarette smoking: unknown. Orders Placed This Encounter  Procedures  . SureSwab, Vaginosis/Vaginitis Plus  . Glucose Tolerance, 2 Hours w/1 Hour  . CBC  . HIV antibody  . RPR  . Fetal fibronectin  . POCT urinalysis dipstick   No orders of the defined types were placed in this encounter.   Follow up in 2 Weeks.

## 2015-11-29 ENCOUNTER — Other Ambulatory Visit: Payer: Self-pay | Admitting: Certified Nurse Midwife

## 2015-11-29 ENCOUNTER — Telehealth: Payer: Self-pay

## 2015-11-29 ENCOUNTER — Encounter: Payer: Self-pay | Admitting: Certified Nurse Midwife

## 2015-11-29 DIAGNOSIS — O24419 Gestational diabetes mellitus in pregnancy, unspecified control: Secondary | ICD-10-CM

## 2015-11-29 DIAGNOSIS — O0993 Supervision of high risk pregnancy, unspecified, third trimester: Secondary | ICD-10-CM

## 2015-11-29 LAB — GLUCOSE TOLERANCE, 2 HOURS W/ 1HR
GLUCOSE: 207 mg/dL — AB (ref 70–170)
Glucose, 2 hour: 178 mg/dL — ABNORMAL HIGH (ref 70–139)
Glucose, Fasting: 83 mg/dL (ref 65–99)

## 2015-11-29 LAB — RPR

## 2015-11-29 LAB — HIV ANTIBODY (ROUTINE TESTING W REFLEX): HIV 1&2 Ab, 4th Generation: NONREACTIVE

## 2015-11-29 MED ORDER — METFORMIN HCL 500 MG PO TABS: 500.0000 mg | ORAL_TABLET | Freq: Two times a day (BID) | ORAL | Status: DC

## 2015-11-29 MED ORDER — VITAFOL FE+ 90-1-200 & 50 MG PO CPPK
2.0000 | ORAL_CAPSULE | Freq: Every day | ORAL | Status: DC
Start: 2015-11-29 — End: 2015-12-11

## 2015-11-29 NOTE — Telephone Encounter (Signed)
COULD NOT REACH PATIENT REGARDING U/S AT [facility] ON 2/161/7 - PHONE RINGS AND RINGS AND THEN CONTINUOUS BUSY SIGNAL

## 2015-12-01 ENCOUNTER — Encounter: Payer: Medicaid Other | Admitting: Certified Nurse Midwife

## 2015-12-01 ENCOUNTER — Other Ambulatory Visit: Payer: Medicaid Other

## 2015-12-01 ENCOUNTER — Encounter: Payer: Self-pay | Admitting: Certified Nurse Midwife

## 2015-12-01 LAB — SURESWAB, VAGINOSIS/VAGINITIS PLUS
Atopobium vaginae: NOT DETECTED Log (cells/mL)
C. ALBICANS, DNA: DETECTED — AB
C. GLABRATA, DNA: NOT DETECTED
C. TRACHOMATIS RNA, TMA: NOT DETECTED
C. TROPICALIS, DNA: NOT DETECTED
C. parapsilosis, DNA: NOT DETECTED
GARDNERELLA VAGINALIS: 5.1 Log (cells/mL)
LACTOBACILLUS SPECIES: 7 Log (cells/mL)
MEGASPHAERA SPECIES: NOT DETECTED Log (cells/mL)
N. gonorrhoeae RNA, TMA: NOT DETECTED
T. VAGINALIS RNA, QL TMA: NOT DETECTED

## 2015-12-04 ENCOUNTER — Ambulatory Visit: Payer: Medicaid Other | Admitting: *Deleted

## 2015-12-04 ENCOUNTER — Other Ambulatory Visit: Payer: Self-pay | Admitting: Certified Nurse Midwife

## 2015-12-04 DIAGNOSIS — O24410 Gestational diabetes mellitus in pregnancy, diet controlled: Secondary | ICD-10-CM

## 2015-12-04 DIAGNOSIS — N760 Acute vaginitis: Secondary | ICD-10-CM

## 2015-12-04 DIAGNOSIS — O23593 Infection of other part of genital tract in pregnancy, third trimester: Secondary | ICD-10-CM

## 2015-12-04 MED ORDER — TERCONAZOLE 0.4 % VA CREA: 1.0000 | TOPICAL_CREAM | Freq: Every day | VAGINAL | Status: DC

## 2015-12-04 MED ORDER — GLUCOSE BLOOD VI STRP
ORAL_STRIP | Status: DC
Start: 2015-12-04 — End: 2016-01-14

## 2015-12-04 MED ORDER — FLUCONAZOLE 100 MG PO TABS: 100.0000 mg | ORAL_TABLET | Freq: Once | ORAL | Status: DC

## 2015-12-04 MED ORDER — ACCU-CHEK MULTICLIX LANCETS MISC
Status: DC
Start: 2015-12-04 — End: 2016-01-14

## 2015-12-04 MED ORDER — ACCU-CHEK AVIVA DEVI: Status: DC

## 2015-12-07 ENCOUNTER — Ambulatory Visit (HOSPITAL_COMMUNITY)
Admission: RE | Admit: 2015-12-07 | Discharge: 2015-12-07 | Disposition: A | Discharge status: Home / Self Care | Payer: Medicaid Other | Source: Ambulatory Visit | Attending: Certified Nurse Midwife | Admitting: Certified Nurse Midwife

## 2015-12-07 ENCOUNTER — Inpatient Hospital Stay (HOSPITAL_COMMUNITY): Admission: RE | Admit: 2015-12-07 | Payer: Medicaid Other | Source: Ambulatory Visit

## 2015-12-07 DIAGNOSIS — O24419 Gestational diabetes mellitus in pregnancy, unspecified control: Secondary | ICD-10-CM

## 2015-12-07 DIAGNOSIS — Z3A34 34 weeks gestation of pregnancy: Secondary | ICD-10-CM | POA: Insufficient documentation

## 2015-12-07 DIAGNOSIS — B182 Chronic viral hepatitis C: Secondary | ICD-10-CM | POA: Insufficient documentation

## 2015-12-07 DIAGNOSIS — O99332 Smoking (tobacco) complicating pregnancy, second trimester: Secondary | ICD-10-CM | POA: Insufficient documentation

## 2015-12-07 DIAGNOSIS — O98413 Viral hepatitis complicating pregnancy, third trimester: Secondary | ICD-10-CM | POA: Diagnosis not present

## 2015-12-07 DIAGNOSIS — O24415 Gestational diabetes mellitus in pregnancy, controlled by oral hypoglycemic drugs: Secondary | ICD-10-CM | POA: Diagnosis not present

## 2015-12-07 DIAGNOSIS — O358XX0 Maternal care for other (suspected) fetal abnormality and damage, not applicable or unspecified: Secondary | ICD-10-CM | POA: Insufficient documentation

## 2015-12-07 DIAGNOSIS — O0993 Supervision of high risk pregnancy, unspecified, third trimester: Secondary | ICD-10-CM

## 2015-12-08 ENCOUNTER — Other Ambulatory Visit: Payer: Self-pay | Admitting: Certified Nurse Midwife

## 2015-12-11 ENCOUNTER — Inpatient Hospital Stay (HOSPITAL_COMMUNITY)
Admission: AD | Admit: 2015-12-11 | Discharge: 2015-12-11 | Disposition: A | Discharge status: Home / Self Care | Payer: Medicaid Other | Source: Ambulatory Visit | Attending: Obstetrics | Admitting: Obstetrics

## 2015-12-11 ENCOUNTER — Encounter (HOSPITAL_COMMUNITY): Payer: Self-pay | Admitting: *Deleted

## 2015-12-11 DIAGNOSIS — J45909 Unspecified asthma, uncomplicated: Secondary | ICD-10-CM | POA: Diagnosis not present

## 2015-12-11 DIAGNOSIS — Z3A34 34 weeks gestation of pregnancy: Secondary | ICD-10-CM | POA: Insufficient documentation

## 2015-12-11 DIAGNOSIS — R197 Diarrhea, unspecified: Secondary | ICD-10-CM | POA: Diagnosis not present

## 2015-12-11 DIAGNOSIS — Z3A35 35 weeks gestation of pregnancy: Secondary | ICD-10-CM | POA: Diagnosis not present

## 2015-12-11 DIAGNOSIS — Z87891 Personal history of nicotine dependence: Secondary | ICD-10-CM | POA: Insufficient documentation

## 2015-12-11 DIAGNOSIS — O4703 False labor before 37 completed weeks of gestation, third trimester: Secondary | ICD-10-CM

## 2015-12-11 DIAGNOSIS — K591 Functional diarrhea: Secondary | ICD-10-CM

## 2015-12-11 DIAGNOSIS — O9989 Other specified diseases and conditions complicating pregnancy, childbirth and the puerperium: Secondary | ICD-10-CM | POA: Insufficient documentation

## 2015-12-11 HISTORY — DX: Gestational diabetes mellitus in pregnancy, unspecified control: O24.419

## 2015-12-11 LAB — URINALYSIS, ROUTINE W REFLEX MICROSCOPIC
Bilirubin Urine: NEGATIVE
GLUCOSE, UA: NEGATIVE mg/dL
Hgb urine dipstick: NEGATIVE
Ketones, ur: 15 mg/dL — AB
NITRITE: POSITIVE — AB
PH: 6.5 (ref 5.0–8.0)
Protein, ur: 30 mg/dL — AB
SPECIFIC GRAVITY, URINE: 1.025 (ref 1.005–1.030)

## 2015-12-11 LAB — RAPID URINE DRUG SCREEN, HOSP PERFORMED
AMPHETAMINES: NOT DETECTED
BARBITURATES: NOT DETECTED
BENZODIAZEPINES: NOT DETECTED
COCAINE: NOT DETECTED
Opiates: NOT DETECTED
TETRAHYDROCANNABINOL: NOT DETECTED

## 2015-12-11 LAB — URINE MICROSCOPIC-ADD ON

## 2015-12-11 LAB — AMNISURE RUPTURE OF MEMBRANE (ROM) NOT AT ARMC: AMNISURE: NEGATIVE

## 2015-12-11 MED ORDER — ONDANSETRON 8 MG PO TBDP: 8.0000 mg | ORAL_TABLET | Freq: Three times a day (TID) | ORAL | Status: DC | PRN

## 2015-12-11 MED ORDER — GI COCKTAIL ~~LOC~~
30.0000 mL | Freq: Once | ORAL | Status: AC
  Administered 2015-12-11: 30 mL via ORAL
  Filled 2015-12-11: qty 30

## 2015-12-11 MED ORDER — ONDANSETRON 8 MG PO TBDP
8.0000 mg | ORAL_TABLET | Freq: Once | ORAL | Status: AC
  Administered 2015-12-11: 8 mg via ORAL
  Filled 2015-12-11: qty 1

## 2015-12-11 NOTE — Discharge Instructions (Signed)

## 2015-12-11 NOTE — MAU Provider Note (Signed)
History    G2P0010 @ 34.5 wks in with c/o contractions and diarrhea since yesterday, no vomiting. Concerned she mighty be leaking fluid. CSN: [id number]  Arrival date & time 12/11/15  1544   None     No chief complaint on file.   HPI  Past Medical History  Diagnosis Date  . Chlamydia   . Pregnant   . History of heroin abuse   . Asthma     "grew out"  . Hepatitis C June 2016  . Infection     UTI  . Anxiety   . Depression     doing really good    Past Surgical History  Procedure Laterality Date  . No past surgeries      Family History  Problem Relation Age of Onset  . Diabetes Mother   . Pulmonary embolism Mother   . Stroke Mother   . Epilepsy Brother   . Cancer Paternal Grandmother     liver    Social History  Substance Use Topics  . Smoking status: Former Smoker -- 1.00 packs/day    Types: Cigarettes  . Smokeless tobacco: Never Used     Comment: quit with + preg test  . Alcohol Use: No     Comment: Occas use.; none since mid Sept    OB History    Gravida Para Term Preterm AB TAB SAB Ectopic Multiple Living        Review of Systems  Constitutional: Negative.   HENT: Negative.   Eyes: Negative.   Respiratory: Negative.   Cardiovascular: Negative.   Gastrointestinal: Positive for diarrhea.  Endocrine: Negative.   Genitourinary: Negative.   Musculoskeletal: Negative.   Skin: Negative.   Allergic/Immunologic: Negative.   Neurological: Negative.   Hematological: Negative.   Psychiatric/Behavioral: Negative.     Allergies  Penicillins  Home Medications  No current outpatient prescriptions on file.  BP 107/60 mmHg  Pulse 105  Temp(Src) 98.3 F (36.8 C) (Oral)  Resp 18  Ht  (1.6 m)  Wt 151 lb 6.4 oz (68.675 kg)  BMI 26.83 kg/m2  SpO2 97%  LMP 04/21/2015  Physical Exam  Constitutional: She is oriented to person, place, and time. She appears well-developed and well-nourished.  HENT:  Head: Normocephalic.   Eyes: Pupils are equal, round, and reactive to light.  Neck: Normal range of motion.  Cardiovascular: Normal rate, regular rhythm, normal heart sounds and intact distal pulses.   Pulmonary/Chest: Effort normal and breath sounds normal.  Abdominal: Soft. Bowel sounds are normal.  Genitourinary: Vagina normal and uterus normal.  Musculoskeletal: Normal range of motion.  Neurological: She is alert and oriented to person, place, and time. She has normal reflexes.  Skin: Skin is warm and dry.  Psychiatric: She has a normal mood and affect. Her behavior is normal. Judgment and thought content normal.    MAU Course  Procedures (including critical care time)  Labs Reviewed - No data to display No results found.   No diagnosis found.    MDM  Diarrhea preg at 34.5, no active leaking noted Stable maternal fetal unit SVE cl/th/post/high amnisure neg. Will d/c home

## 2015-12-11 NOTE — Progress Notes (Signed)
E signature page not working in room 9.  Patient signed paper copy.

## 2015-12-11 NOTE — MAU Note (Signed)
Diarrhea started last week on Sunday, has increased- going a few times a day.  Denies vomiting.  Contractions started last night.

## 2015-12-12 ENCOUNTER — Ambulatory Visit (INDEPENDENT_AMBULATORY_CARE_PROVIDER_SITE_OTHER): Payer: Medicaid Other | Admitting: Certified Nurse Midwife

## 2015-12-12 VITALS — BP 99/61 | HR 89 | Wt 152.0 lb

## 2015-12-12 DIAGNOSIS — O99323 Drug use complicating pregnancy, third trimester: Secondary | ICD-10-CM

## 2015-12-12 DIAGNOSIS — Z3403 Encounter for supervision of normal first pregnancy, third trimester: Secondary | ICD-10-CM

## 2015-12-12 LAB — POCT URINALYSIS DIPSTICK
BILIRUBIN UA: NEGATIVE
GLUCOSE UA: NEGATIVE
KETONES UA: NEGATIVE
Leukocytes, UA: NEGATIVE
NITRITE UA: NEGATIVE
Protein, UA: NEGATIVE
RBC UA: NEGATIVE
Spec Grav, UA: 1.015
Urobilinogen, UA: >=8
pH, UA: 6

## 2015-12-12 NOTE — Progress Notes (Signed)
Subjective:    Laurie [name] is a 20 y.o. female being seen today for her obstetrical visit. She is at [redacted]w[redacted]d gestation. Patient reports no complaints. Fetal movement: normal.  Reports blood sugars ranging from 70's-160's.  Has not started on Metformin.  Discussed possibilities for birth.    Problem List Items Addressed This Visit    None    Visit Diagnoses    Encounter for supervision of normal first pregnancy in third trimester    -  Primary    Relevant Orders    POCT urinalysis dipstick (Completed)      Patient Active Problem List   Diagnosis Date Noted  . Polysubstance abuse 08/31/2015   Objective:    BP 99/61 mmHg  Pulse 89  Wt 152 lb (68.947 kg)  LMP 04/21/2015 FHT:  155 BPM  Uterine Size: size equals dates  Presentation: breech   NST: + accels, no decels, no contractions, moderate variability. Cat. 1 tracing.   Assessment:    Pregnancy @ [redacted]w[redacted]d weeks   + marijuana use  GDM Reactive NST Plan:     labs reviewed, problem list updated Consent signed. GBS sent TDAP offered  Rhogam given for RH negative Pediatrician: discussed. Infant feeding: plans to breastfeed. Maternity leave: discussed. Cigarette smoking: smokes marijuana. Orders Placed This Encounter  Procedures  . POCT urinalysis dipstick   No orders of the defined types were placed in this encounter.   Follow up in 1 Week with NST and with Dr. Clearance Coots to meet him d/t fetal position of breech.

## 2015-12-13 ENCOUNTER — Encounter: Payer: Medicaid Other | Admitting: Certified Nurse Midwife

## 2015-12-14 ENCOUNTER — Encounter: Payer: Medicaid Other | Admitting: Certified Nurse Midwife

## 2015-12-14 ENCOUNTER — Ambulatory Visit (HOSPITAL_COMMUNITY): Payer: No Typology Code available for payment source

## 2015-12-14 ENCOUNTER — Ambulatory Visit (HOSPITAL_COMMUNITY): Payer: Medicaid Other

## 2015-12-14 ENCOUNTER — Other Ambulatory Visit: Payer: Self-pay | Admitting: Certified Nurse Midwife

## 2015-12-15 ENCOUNTER — Other Ambulatory Visit (HOSPITAL_COMMUNITY): Payer: Self-pay | Admitting: Maternal and Fetal Medicine

## 2015-12-15 ENCOUNTER — Ambulatory Visit (HOSPITAL_COMMUNITY): Payer: Medicaid Other

## 2015-12-15 ENCOUNTER — Encounter (HOSPITAL_COMMUNITY): Payer: Self-pay

## 2015-12-15 ENCOUNTER — Ambulatory Visit (HOSPITAL_COMMUNITY)
Admission: RE | Admit: 2015-12-15 | Discharge: 2015-12-15 | Disposition: A | Discharge status: Home / Self Care | Payer: Medicaid Other | Source: Ambulatory Visit | Attending: Certified Nurse Midwife | Admitting: Certified Nurse Midwife

## 2015-12-15 DIAGNOSIS — Z3A35 35 weeks gestation of pregnancy: Secondary | ICD-10-CM

## 2015-12-15 DIAGNOSIS — O358XX0 Maternal care for other (suspected) fetal abnormality and damage, not applicable or unspecified: Secondary | ICD-10-CM | POA: Insufficient documentation

## 2015-12-15 DIAGNOSIS — O24415 Gestational diabetes mellitus in pregnancy, controlled by oral hypoglycemic drugs: Secondary | ICD-10-CM

## 2015-12-15 DIAGNOSIS — O24419 Gestational diabetes mellitus in pregnancy, unspecified control: Secondary | ICD-10-CM

## 2015-12-15 DIAGNOSIS — B182 Chronic viral hepatitis C: Secondary | ICD-10-CM

## 2015-12-15 DIAGNOSIS — O98413 Viral hepatitis complicating pregnancy, third trimester: Secondary | ICD-10-CM | POA: Insufficient documentation

## 2015-12-15 DIAGNOSIS — O359XX0 Maternal care for (suspected) fetal abnormality and damage, unspecified, not applicable or unspecified: Secondary | ICD-10-CM

## 2015-12-15 DIAGNOSIS — O99332 Smoking (tobacco) complicating pregnancy, second trimester: Secondary | ICD-10-CM

## 2015-12-15 DIAGNOSIS — Z3A34 34 weeks gestation of pregnancy: Secondary | ICD-10-CM | POA: Insufficient documentation

## 2015-12-15 DIAGNOSIS — O99322 Drug use complicating pregnancy, second trimester: Secondary | ICD-10-CM

## 2015-12-19 ENCOUNTER — Ambulatory Visit (INDEPENDENT_AMBULATORY_CARE_PROVIDER_SITE_OTHER): Payer: Medicaid Other | Admitting: Obstetrics

## 2015-12-19 ENCOUNTER — Encounter (HOSPITAL_COMMUNITY): Payer: Self-pay | Admitting: *Deleted

## 2015-12-19 ENCOUNTER — Inpatient Hospital Stay (HOSPITAL_COMMUNITY)
Admission: AD | Admit: 2015-12-19 | Discharge: 2015-12-19 | Disposition: A | Discharge status: Home / Self Care | Payer: Medicaid Other | Source: Ambulatory Visit | Attending: Obstetrics | Admitting: Obstetrics

## 2015-12-19 ENCOUNTER — Inpatient Hospital Stay (HOSPITAL_COMMUNITY): Payer: Medicaid Other

## 2015-12-19 VITALS — BP 98/57 | HR 81 | Temp 98.2°F | Wt 154.0 lb

## 2015-12-19 DIAGNOSIS — O26853 Spotting complicating pregnancy, third trimester: Secondary | ICD-10-CM | POA: Diagnosis not present

## 2015-12-19 DIAGNOSIS — O4703 False labor before 37 completed weeks of gestation, third trimester: Secondary | ICD-10-CM

## 2015-12-19 DIAGNOSIS — O99322 Drug use complicating pregnancy, second trimester: Secondary | ICD-10-CM | POA: Diagnosis not present

## 2015-12-19 DIAGNOSIS — Z3A35 35 weeks gestation of pregnancy: Secondary | ICD-10-CM | POA: Diagnosis not present

## 2015-12-19 DIAGNOSIS — O0993 Supervision of high risk pregnancy, unspecified, third trimester: Secondary | ICD-10-CM

## 2015-12-19 DIAGNOSIS — Z3A36 36 weeks gestation of pregnancy: Secondary | ICD-10-CM | POA: Diagnosis not present

## 2015-12-19 DIAGNOSIS — O368390 Maternal care for abnormalities of the fetal heart rate or rhythm, unspecified trimester, not applicable or unspecified: Secondary | ICD-10-CM

## 2015-12-19 LAB — URINALYSIS, ROUTINE W REFLEX MICROSCOPIC
Bilirubin Urine: NEGATIVE
GLUCOSE, UA: NEGATIVE mg/dL
Hgb urine dipstick: NEGATIVE
Ketones, ur: >80 mg/dL — AB
Nitrite: NEGATIVE
Protein, ur: NEGATIVE mg/dL
Specific Gravity, Urine: 1.02 (ref 1.005–1.030)
pH: 6 (ref 5.0–8.0)

## 2015-12-19 LAB — POCT URINALYSIS DIPSTICK
Bilirubin, UA: NEGATIVE
Blood, UA: NEGATIVE
Glucose, UA: NEGATIVE
KETONES UA: NEGATIVE
LEUKOCYTES UA: NEGATIVE
Nitrite, UA: NEGATIVE
Protein, UA: NEGATIVE
SPEC GRAV UA: 1.01
Urobilinogen, UA: NEGATIVE
pH, UA: 7

## 2015-12-19 LAB — WET PREP, GENITAL
CLUE CELLS WET PREP: NONE SEEN
SPERM: NONE SEEN
Trich, Wet Prep: NONE SEEN
Yeast Wet Prep HPF POC: NONE SEEN

## 2015-12-19 LAB — GROUP B STREP BY PCR: GROUP B STREP BY PCR: NEGATIVE

## 2015-12-19 LAB — URINE MICROSCOPIC-ADD ON

## 2015-12-19 LAB — OB RESULTS CONSOLE GBS: GBS: NEGATIVE

## 2015-12-19 MED ORDER — DEXTROSE 5 % IN LACTATED RINGERS IV BOLUS
1000.0000 mL | Freq: Once | INTRAVENOUS | Status: AC
  Administered 2015-12-19: 1000 mL via INTRAVENOUS

## 2015-12-19 MED ORDER — LACTATED RINGERS IV BOLUS (SEPSIS)
1000.0000 mL | Freq: Once | INTRAVENOUS | Status: AC
  Administered 2015-12-19: 1000 mL via INTRAVENOUS

## 2015-12-19 NOTE — Discharge Instructions (Signed)
Braxton Hicks Contractions °Contractions of the uterus can occur throughout pregnancy. Contractions are not always a sign that you are in labor.  °WHAT ARE BRAXTON HICKS CONTRACTIONS?  °Contractions that occur before labor are called Braxton Hicks contractions, or false labor. Toward the end of pregnancy (32-34 weeks), these contractions can develop more often and may become more forceful. This is not true labor because these contractions do not result in opening (dilatation) and thinning of the cervix. They are sometimes difficult to tell apart from true labor because these contractions can be forceful and people have different pain tolerances. You should not feel embarrassed if you go to the hospital with false labor. Sometimes, the only way to tell if you are in true labor is for your health care provider to look for changes in the cervix. °If there are no prenatal problems or other health problems associated with the pregnancy, it is completely safe to be sent home with false labor and await the onset of true labor. °HOW CAN YOU TELL THE DIFFERENCE BETWEEN TRUE AND FALSE LABOR? °False Labor °· The contractions of false labor are usually shorter and not as hard as those of true labor.   °· The contractions are usually irregular.   °· The contractions are often felt in the front of the lower abdomen and in the groin.   °· The contractions may go away when you walk around or change positions while lying down.   °· The contractions get weaker and are shorter lasting as time goes on.   °· The contractions do not usually become progressively stronger, regular, and closer together as with true labor.   °True Labor °1. Contractions in true labor last 30-70 seconds, become very regular, usually become more intense, and increase in frequency.   °2. The contractions do not go away with walking.   °3. The discomfort is usually felt in the top of the uterus and spreads to the lower abdomen and low back.   °4. True labor can  be determined by your health care provider with an exam. This will show that the cervix is dilating and getting thinner.   °WHAT TO REMEMBER °· Keep up with your usual exercises and follow other instructions given by your health care provider.   °· Take medicines as directed by your health care provider.   °· Keep your regular prenatal appointments.   °· Eat and drink lightly if you think you are going into labor.   °· If Braxton Hicks contractions are making you uncomfortable:   °· Change your position from lying down or resting to walking, or from walking to resting.   °· Sit and rest in a tub of warm water.   °· Drink 2-3 glasses of water. Dehydration may cause these contractions.   °· Do slow and deep breathing several times an hour.   °WHEN SHOULD I SEEK IMMEDIATE MEDICAL CARE? °Seek immediate medical care if: °· Your contractions become stronger, more regular, and closer together.   °· You have fluid leaking or gushing from your vagina.   °· You have a fever.   °· You pass blood-tinged mucus.   °· You have vaginal bleeding.   °· You have continuous abdominal pain.   °· You have low back pain that you never had before.   °· You feel your baby's head pushing down and causing pelvic pressure.   °· Your baby is not moving as much as it used to.   °  °This information is not intended to replace advice given to you by your health care provider. Make sure you discuss any questions you have with your health care   provider. °  °Document Released: 10/07/2005 Document Revised: 10/12/2013 Document Reviewed: 07/19/2013 °Elsevier Interactive Patient Education ©2016 [employer] ° °Fetal Movement Counts °Patient Name: __________________________________________________ Patient Due Date: ____________________ °Performing a fetal movement count is highly recommended in high-risk pregnancies, but it is good for every pregnant woman to do. Your health care provider may ask you to start counting fetal movements at 28 weeks of the  pregnancy. Fetal movements often increase: °· After eating a full meal. °· After physical activity. °· After eating or drinking something sweet or cold. °· At rest. °Pay attention to when you feel the baby is most active. This will help you notice a pattern of your baby's sleep and wake cycles and what factors contribute to an increase in fetal movement. It is important to perform a fetal movement count at the same time each day when your baby is normally most active.  °HOW TO COUNT FETAL MOVEMENTS °5. Find a quiet and comfortable area to sit or lie down on your left side. Lying on your left side provides the best blood and oxygen circulation to your baby. °6. Write down the day and time on a sheet of paper or in a journal. °7. Start counting kicks, flutters, swishes, rolls, or jabs in a 2-hour period. You should feel at least 10 movements within 2 hours. °8. If you do not feel 10 movements in 2 hours, wait 2-3 hours and count again. Look for a change in the pattern or not enough counts in 2 hours. °SEEK MEDICAL CARE IF: °· You feel less than 10 counts in 2 hours, tried twice. °· There is no movement in over an hour. °· The pattern is changing or taking longer each day to reach 10 counts in 2 hours. °· You feel the baby is not moving as he or she usually does. °Date: ____________ Movements: ____________ Start time: ____________ Finish time: ____________  °Date: ____________ Movements: ____________ Start time: ____________ Finish time: ____________ °Date: ____________ Movements: ____________ Start time: ____________ Finish time: ____________ °Date: ____________ Movements: ____________ Start time: ____________ Finish time: ____________ °Date: ____________ Movements: ____________ Start time: ____________ Finish time: ____________ °Date: ____________ Movements: ____________ Start time: ____________ Finish time: ____________ °Date: ____________ Movements: ____________ Start time: ____________ Finish time:  ____________ °Date: ____________ Movements: ____________ Start time: ____________ Finish time: ____________  °Date: ____________ Movements: ____________ Start time: ____________ Finish time: ____________ °Date: ____________ Movements: ____________ Start time: ____________ Finish time: ____________ °Date: ____________ Movements: ____________ Start time: ____________ Finish time: ____________ °Date: ____________ Movements: ____________ Start time: ____________ Finish time: ____________ °Date: ____________ Movements: ____________ Start time: ____________ Finish time: ____________ °Date: ____________ Movements: ____________ Start time: ____________ Finish time: ____________ °Date: ____________ Movements: ____________ Start time: ____________ Finish time: ____________  °Date: ____________ Movements: ____________ Start time: ____________ Finish time: ____________ °Date: ____________ Movements: ____________ Start time: ____________ Finish time: ____________ °Date: ____________ Movements: ____________ Start time: ____________ Finish time: ____________ °Date: ____________ Movements: ____________ Start time: ____________ Finish time: ____________ °Date: ____________ Movements: ____________ Start time: ____________ Finish time: ____________ °Date: ____________ Movements: ____________ Start time: ____________ Finish time: ____________ °Date: ____________ Movements: ____________ Start time: ____________ Finish time: ____________  °Date: ____________ Movements: ____________ Start time: ____________ Finish time: ____________ °Date: ____________ Movements: ____________ Start time: ____________ Finish time: ____________ °Date: ____________ Movements: ____________ Start time: ____________ Finish time: ____________ °Date: ____________ Movements: ____________ Start time: ____________ Finish time: ____________ °Date: ____________ Movements: ____________ Start time: ____________ Finish time: ____________ °Date: ____________ Movements:  ____________ Start time: ____________ Finish   time: ____________ °Date: ____________ Movements: ____________ Start time: ____________ Finish time: ____________  °Date: ____________ Movements: ____________ Start time: ____________ Finish time: ____________ °Date: ____________ Movements: ____________ Start time: ____________ Finish time: ____________ °Date: ____________ Movements: ____________ Start time: ____________ Finish time: ____________ °Date: ____________ Movements: ____________ Start time: ____________ Finish time: ____________ °Date: ____________ Movements: ____________ Start time: ____________ Finish time: ____________ °Date: ____________ Movements: ____________ Start time: ____________ Finish time: ____________ °Date: ____________ Movements: ____________ Start time: ____________ Finish time: ____________  °Date: ____________ Movements: ____________ Start time: ____________ Finish time: ____________ °Date: ____________ Movements: ____________ Start time: ____________ Finish time: ____________ °Date: ____________ Movements: ____________ Start time: ____________ Finish time: ____________ °Date: ____________ Movements: ____________ Start time: ____________ Finish time: ____________ °Date: ____________ Movements: ____________ Start time: ____________ Finish time: ____________ °Date: ____________ Movements: ____________ Start time: ____________ Finish time: ____________ °Date: ____________ Movements: ____________ Start time: ____________ Finish time: ____________  °Date: ____________ Movements: ____________ Start time: ____________ Finish time: ____________ °Date: ____________ Movements: ____________ Start time: ____________ Finish time: ____________ °Date: ____________ Movements: ____________ Start time: ____________ Finish time: ____________ °Date: ____________ Movements: ____________ Start time: ____________ Finish time: ____________ °Date: ____________ Movements: ____________ Start time: ____________ Finish  time: ____________ °Date: ____________ Movements: ____________ Start time: ____________ Finish time: ____________ °Date: ____________ Movements: ____________ Start time: ____________ Finish time: ____________  °Date: ____________ Movements: ____________ Start time: ____________ Finish time: ____________ °Date: ____________ Movements: ____________ Start time: ____________ Finish time: ____________ °Date: ____________ Movements: ____________ Start time: ____________ Finish time: ____________ °Date: ____________ Movements: ____________ Start time: ____________ Finish time: ____________ °Date: ____________ Movements: ____________ Start time: ____________ Finish time: ____________ °Date: ____________ Movements: ____________ Start time: ____________ Finish time: ____________ °  °This information is not intended to replace advice given to you by your health care provider. Make sure you discuss any questions you have with your health care provider. °  °Document Released: 11/06/2006 Document Revised: 10/28/2014 Document Reviewed: 08/03/2012 °Elsevier Interactive Patient Education ©2016 [employer] ° °

## 2015-12-19 NOTE — MAU Note (Signed)
Pt sent from MD office, have uc's on EFM.  Denies bleeding, feels wet, ? SROM.

## 2015-12-19 NOTE — Progress Notes (Signed)
Irregularity noted in FHR with no particular pattern

## 2015-12-19 NOTE — MAU Note (Signed)
Pt states was told at office was to start Metformin but script was never sent to pharmacy per pharmacy. Pt states told them at office today. States her sugars have been doing well

## 2015-12-19 NOTE — MAU Provider Note (Signed)
History     CSN: [id number]  Arrival date and time: 12/19/15 1727   First Provider Initiated Contact with Patient 12/19/15 1830         Chief Complaint  Patient presents with  . Contractions   HPI Laurie [name] is a 20 y.o. G2P0010 at [redacted]w[redacted]d who presents for contractions. Was seen in office today for NST & noted to have contractions on the monitor; sent her for PTL evaluation & IV fluids. Patient denies feeling contractions or abdominal pain. Reports occasional mild cramping. Denies vaginal bleeding, LOF, or discharge. Reports positive fetal movement.   OB History    Gravida Para Term Preterm AB TAB SAB Ectopic Multiple Living        Past Medical History  Diagnosis Date  . Chlamydia   . Pregnant   . History of heroin abuse   . Asthma     "grew out"  . Hepatitis C June 2016  . Infection     UTI  . Anxiety   . Depression     doing really good  . Gestational diabetes     Past Surgical History  Procedure Laterality Date  . No past surgeries      Family History  Problem Relation Age of Onset  . Diabetes Mother   . Pulmonary embolism Mother   . Stroke Mother   . Epilepsy Brother   . Cancer Paternal Grandmother     liver    Social History  Substance Use Topics  . Smoking status: Current Some Day Smoker -- 1.00 packs/day    Types: Cigarettes  . Smokeless tobacco: Never Used     Comment: quit with + preg test  . Alcohol Use: No     Comment: Occas use.; none since mid Sept    Allergies:  Allergies  Allergen Reactions  . Penicillins Other (See Comments)    Results thru an allergy skin test Has patient had a PCN reaction causing immediate rash, facial/tongue/throat swelling, SOB or lightheadedness with hypotension: No Has patient had a PCN reaction causing severe rash involving mucus membranes or skin necrosis: No Has patient had a PCN reaction that required hospitalization No Has patient had a PCN reaction occurring within the last 10  years: No If all of the above answers are "NO", then may proceed with Cephalosporin use.      Prescriptions prior to admission  Medication Sig Dispense Refill Last Dose  . ondansetron (ZOFRAN ODT) 8 MG disintegrating tablet Take 1 tablet (8 mg total) by mouth every 8 (eight) hours as needed for nausea or vomiting. 20 tablet 0 12/18/2015 at Unknown time  . Blood Glucose Monitoring Suppl (ACCU-CHEK AVIVA) device Patient to check blood sugars four times daily. 1 each 0 Taking  . glucose blood (ACCU-CHEK AVIVA) test strip Patient to check blood sugars four times daily. 100 each 12 Taking  . Lancets (ACCU-CHEK MULTICLIX) lancets Patient to check blood sugar 4 times daily. 100 each 12 Taking    Review of Systems  Constitutional: Negative.   Gastrointestinal: Negative.   Genitourinary: Negative.    Physical Exam   Blood pressure 104/44, pulse 77, temperature 97.7 F (36.5 C), temperature source Oral, resp. rate 16, last menstrual period 04/21/2015, unknown if currently breastfeeding.  Physical Exam  Nursing note and vitals reviewed. Constitutional: She is oriented to person, place, and time. She appears well-developed and well-nourished. No distress.  HENT:  Head: Normocephalic and atraumatic.  Eyes: Conjunctivae are normal. Right eye exhibits no discharge. Left eye exhibits no discharge. No scleral icterus.  Neck: Normal range of motion.  Cardiovascular: Normal rate, regular rhythm and normal heart sounds.   No murmur heard. Respiratory: Effort normal and breath sounds normal. No respiratory distress. She has no wheezes.  GI: Soft. There is no tenderness.  Neurological: She is alert and oriented to person, place, and time.  Skin: Skin is warm and dry. She is not diaphoretic.  Psychiatric: She has a normal mood and affect. Her behavior is normal. Judgment and thought content normal.   Dilation: 1 (rechecked cervix per Renel Ende NP.) Effacement (%): 50 Cervical Position:  Posterior Station: Web designer, -3 Presentation: Undeterminable Exam by:: Remigio Eisenmenger RN  Fetal Tracing:  Baseline: 125 Variability: moderate Accelerations: 15x15 Decelerations: variable  Toco: 2-7   MAU Course  Procedures Results for orders placed or performed during the hospital encounter of 12/19/15 (from the past 24 hour(s))  Wet prep, genital     Status: Abnormal   Collection Time: 12/19/15  6:37 PM  Result Value Ref Range   Yeast Wet Prep HPF POC NONE SEEN NONE SEEN   Trich, Wet Prep NONE SEEN NONE SEEN   Clue Cells Wet Prep HPF POC NONE SEEN NONE SEEN   WBC, Wet Prep HPF POC FEW (A) NONE SEEN   Sperm NONE SEEN   Group B strep by PCR     Status: None   Collection Time: 12/19/15  6:37 PM  Result Value Ref Range   Group B strep by PCR NEGATIVE NEGATIVE  Urinalysis, Routine w reflex microscopic (not at [facility])     Status: Abnormal   Collection Time: 12/19/15  6:54 PM  Result Value Ref Range   Color, Urine YELLOW YELLOW   APPearance CLEAR CLEAR   Specific Gravity, Urine 1.020 1.005 - 1.030   pH 6.0 5.0 - 8.0   Glucose, UA NEGATIVE NEGATIVE mg/dL   Hgb urine dipstick NEGATIVE NEGATIVE   Bilirubin Urine NEGATIVE NEGATIVE   Ketones, ur >80 (A) NEGATIVE mg/dL   Protein, ur NEGATIVE NEGATIVE mg/dL   Nitrite NEGATIVE NEGATIVE   Leukocytes, UA SMALL (A) NEGATIVE  Urine microscopic-add on     Status: Abnormal   Collection Time: 12/19/15  6:54 PM  Result Value Ref Range   Squamous Epithelial / LPF 6-30 (A) NONE SEEN   WBC, UA 0-5 0 - 5 WBC/hpf   RBC / HPF 0-5 0 - 5 RBC/hpf   Bacteria, UA FEW (A) NONE SEEN   Urine-Other MUCOUS PRESENT     MDM IV fluids x 2 bags, LR & D5LR BPP ordered for variable decels. 6/8 & AFI normal Cervix unchanged during MAU visit & contractions spaced out s/p IV fluids S/w Dr. Clearance Coots. Ok to discharge home. Pt to call office in morning to schedule f/u appt later this week  Assessment and Plan  A: 1. Preterm contractions, third trimester    2. Variable fetal heart rate decelerations, antepartum     P: Discharge home Call [facility] in morning to schedule f/u appt Discussed reasons to return to MAU  [name] 12/19/2015, 6:17 PM

## 2015-12-20 ENCOUNTER — Inpatient Hospital Stay (HOSPITAL_COMMUNITY)
Admission: AD | Admit: 2015-12-20 | Discharge: 2015-12-20 | Disposition: A | Discharge status: Home / Self Care | Payer: Medicaid Other | Source: Ambulatory Visit | Attending: Obstetrics | Admitting: Obstetrics

## 2015-12-20 ENCOUNTER — Encounter (HOSPITAL_COMMUNITY): Payer: Self-pay | Admitting: *Deleted

## 2015-12-20 ENCOUNTER — Encounter: Payer: Self-pay | Admitting: Obstetrics

## 2015-12-20 DIAGNOSIS — O99333 Smoking (tobacco) complicating pregnancy, third trimester: Secondary | ICD-10-CM | POA: Insufficient documentation

## 2015-12-20 DIAGNOSIS — O26859 Spotting complicating pregnancy, unspecified trimester: Secondary | ICD-10-CM

## 2015-12-20 DIAGNOSIS — Z880 Allergy status to penicillin: Secondary | ICD-10-CM | POA: Diagnosis not present

## 2015-12-20 DIAGNOSIS — O26893 Other specified pregnancy related conditions, third trimester: Secondary | ICD-10-CM | POA: Insufficient documentation

## 2015-12-20 DIAGNOSIS — F17210 Nicotine dependence, cigarettes, uncomplicated: Secondary | ICD-10-CM | POA: Diagnosis not present

## 2015-12-20 DIAGNOSIS — Z3A36 36 weeks gestation of pregnancy: Secondary | ICD-10-CM | POA: Diagnosis not present

## 2015-12-20 DIAGNOSIS — O26853 Spotting complicating pregnancy, third trimester: Secondary | ICD-10-CM | POA: Diagnosis not present

## 2015-12-20 DIAGNOSIS — O24419 Gestational diabetes mellitus in pregnancy, unspecified control: Secondary | ICD-10-CM | POA: Diagnosis not present

## 2015-12-20 DIAGNOSIS — Z6791 Unspecified blood type, Rh negative: Secondary | ICD-10-CM | POA: Insufficient documentation

## 2015-12-20 DIAGNOSIS — O4693 Antepartum hemorrhage, unspecified, third trimester: Secondary | ICD-10-CM | POA: Diagnosis present

## 2015-12-20 LAB — URINALYSIS, ROUTINE W REFLEX MICROSCOPIC
Bilirubin Urine: NEGATIVE
Glucose, UA: NEGATIVE mg/dL
KETONES UR: 15 mg/dL — AB
NITRITE: NEGATIVE
PH: 8.5 — AB (ref 5.0–8.0)
Protein, ur: NEGATIVE mg/dL
SPECIFIC GRAVITY, URINE: 1.015 (ref 1.005–1.030)

## 2015-12-20 LAB — URINE MICROSCOPIC-ADD ON

## 2015-12-20 LAB — WET PREP, GENITAL
CLUE CELLS WET PREP: NONE SEEN
Sperm: NONE SEEN
TRICH WET PREP: NONE SEEN
YEAST WET PREP: NONE SEEN

## 2015-12-20 LAB — OB RESULTS CONSOLE GC/CHLAMYDIA: Gonorrhea: NEGATIVE

## 2015-12-20 NOTE — Discharge Instructions (Signed)
Vaginal Bleeding During Pregnancy, Third Trimester °A small amount of bleeding (spotting) from the vagina is relatively common in pregnancy. Various things can cause bleeding or spotting in pregnancy. Sometimes the bleeding is normal and is not a problem. However, bleeding during the third trimester can also be a sign of something serious for the mother and the baby. Be sure to tell your health care provider about any vaginal bleeding right away.  °Some possible causes of vaginal bleeding during the third trimester include:  °· The placenta may be partially or completely covering the opening to the cervix (placenta previa).   °· The placenta may have separated from the uterus (abruption of the placenta).   °· There may be an infection or growth on the cervix.   °· You may be starting labor, called discharging of the mucus plug.   °· The placenta may grow into the muscle layer of the uterus (placenta accreta).   °HOME CARE INSTRUCTIONS  °Watch your condition for any changes. The following actions may help to lessen any discomfort you are feeling:  °· Follow your health care provider's instructions for limiting your activity. If your health care provider orders bed rest, you may need to stay in bed and only get up to use the bathroom. However, your health care provider may allow you to continue light activity. °· If needed, make plans for someone to help with your regular activities and responsibilities while you are on bed rest. °· Keep track of the number of pads you use each day, how often you change pads, and how soaked (saturated) they are. Write this down. °· Do not use tampons. Do not douche. °· Do not have sexual intercourse or orgasms until approved by your health care provider. °· Follow your health care provider's advice about lifting, driving, and physical activities. °· If you pass any tissue from your vagina, save the tissue so you can show it to your health care provider.   °· Only take over-the-counter  or prescription medicines as directed by your health care provider. °· Do not take aspirin because it can make you bleed.   °· Keep all follow-up appointments as directed by your health care provider. °SEEK MEDICAL CARE IF: °· You have any vaginal bleeding during any part of your pregnancy. °· You have cramps or labor pains. °· You have a fever, not controlled by medicine. °SEEK IMMEDIATE MEDICAL CARE IF:  °· You have severe cramps or pain in your back or belly (abdomen). °· You have chills. °· You have a gush of fluid from the vagina. °· You pass large clots or tissue from your vagina. °· Your bleeding increases. °· You feel light-headed or weak. °· You pass out. °· You feel less movement or no movement of the baby.   °MAKE SURE YOU: °· Understand these instructions. °· Will watch your condition. °· Will get help right away if you are not doing well or get worse. °  °This information is not intended to replace advice given to you by your health care provider. Make sure you discuss any questions you have with your health care provider. °  °Document Released: 12/28/2002 Document Revised: 10/12/2013 Document Reviewed: 06/14/2013 °Elsevier Interactive Patient Education ©2016 [employer] ° °

## 2015-12-20 NOTE — Progress Notes (Signed)
Subjective:    Laurie [name] is a 20 y.o. female being seen today for her obstetrical visit. She is at [redacted]w[redacted]d gestation. Patient reports no complaints. Fetal movement: normal.  Problem List Items Addressed This Visit    None    Visit Diagnoses    Supervision of high risk pregnancy in third trimester    -  Primary    Relevant Orders    POCT urinalysis dipstick (Completed)      Patient Active Problem List   Diagnosis Date Noted  . Polysubstance abuse 08/31/2015   Objective:    BP 98/57 mmHg  Pulse 81  Temp(Src) 98.2 F (36.8 C)  Wt 154 lb (69.854 kg)  LMP 04/21/2015 FHT:  150 BPM  Uterine Size: size equals dates  Presentation: unsure     Assessment:    Pregnancy @ [redacted]w[redacted]d weeks   Plan:     labs reviewed, problem list updated Consent signed. GBS sent TDAP offered  Rhogam given for RH negative Pediatrician: discussed. Infant feeding: plans to breastfeed. Maternity leave: discussed. Cigarette smoking: smokes 1 PPD. Orders Placed This Encounter  Procedures  . POCT urinalysis dipstick   No orders of the defined types were placed in this encounter.   Follow up in 1 Week.

## 2015-12-20 NOTE — MAU Provider Note (Signed)
MAU HISTORY AND PHYSICAL  Chief Complaint:  Vaginal Bleeding   Laurie [name] is a 20 y.o.  G2P0010 with IUP at [redacted]w[redacted]d presenting for Vaginal Bleeding  GDM and Rh negative.  Came to this mau yesterday with contractions. 1/50/-3. Some variable decels, 6/8 bpp with normal afi. Normal placentation, no previa.  Today has had brown discharge since last night. No pain. No leakge of fluid. Feeling baby move.  Past Medical History  Diagnosis Date  . Chlamydia   . Pregnant   . History of heroin abuse   . Asthma     "grew out"  . Hepatitis C June 2016  . Infection     UTI  . Anxiety   . Depression     doing really good  . Gestational diabetes     Past Surgical History  Procedure Laterality Date  . No past surgeries      Family History  Problem Relation Age of Onset  . Diabetes Mother   . Pulmonary embolism Mother   . Stroke Mother   . Epilepsy Brother   . Cancer Paternal Grandmother     liver    Social History  Substance Use Topics  . Smoking status: Current Some Day Smoker -- 0.50 packs/day    Types: Cigarettes  . Smokeless tobacco: Never Used     Comment: quit with + preg test  . Alcohol Use: No     Comment: Occas use.; none since mid Sept    Allergies  Allergen Reactions  . Penicillins Other (See Comments)    Results thru an allergy skin test Has patient had a PCN reaction causing immediate rash, facial/tongue/throat swelling, SOB or lightheadedness with hypotension: No Has patient had a PCN reaction causing severe rash involving mucus membranes or skin necrosis: No Has patient had a PCN reaction that required hospitalization No Has patient had a PCN reaction occurring within the last 10 years: No If all of the above answers are "NO", then may proceed with Cephalosporin use.      Prescriptions prior to admission  Medication Sig Dispense Refill Last Dose  . ondansetron (ZOFRAN ODT) 8 MG disintegrating tablet Take 1 tablet (8 mg total) by mouth every 8  (eight) hours as needed for nausea or vomiting. 20 tablet 0 Past Week at Unknown time  . Blood Glucose Monitoring Suppl (ACCU-CHEK AVIVA) device Patient to check blood sugars four times daily. 1 each 0 Taking  . glucose blood (ACCU-CHEK AVIVA) test strip Patient to check blood sugars four times daily. 100 each 12 Taking  . Lancets (ACCU-CHEK MULTICLIX) lancets Patient to check blood sugar 4 times daily. 100 each 12 Taking    Review of Systems - Negative except for what is mentioned in HPI.  Physical Exam  Blood pressure 106/58, pulse 73, temperature 98.4 F (36.9 C), resp. rate 18, last menstrual period 04/21/2015, unknown if currently breastfeeding. GENERAL: Well-developed, well-nourished female in no acute distress.  LUNGS: Clear to auscultation bilaterally.  HEART: Regular rate and rhythm. ABDOMEN: Soft, nontender, nondistended, gravid.  EXTREMITIES: Nontender, no edema, 2+ distal pulses. GU: normal vagina and cervix, nabothian cysts, tan-colored discharge, no pooling. 1/80/-3   Labs: Results for orders placed or performed during the hospital encounter of 12/20/15 (from the past 24 hour(s))  Urinalysis, Routine w reflex microscopic (not at [facility])   Collection Time: 12/20/15  5:45 PM  Result Value Ref Range   Color, Urine YELLOW YELLOW   APPearance CLEAR CLEAR   Specific Gravity, Urine 1.015  1.005 - 1.030   pH 8.5 (H) 5.0 - 8.0   Glucose, UA NEGATIVE NEGATIVE mg/dL   Hgb urine dipstick TRACE (A) NEGATIVE   Bilirubin Urine NEGATIVE NEGATIVE   Ketones, ur 15 (A) NEGATIVE mg/dL   Protein, ur NEGATIVE NEGATIVE mg/dL   Nitrite NEGATIVE NEGATIVE   Leukocytes, UA TRACE (A) NEGATIVE  Urine microscopic-add on   Collection Time: 12/20/15  5:45 PM  Result Value Ref Range   Squamous Epithelial / LPF 0-5 (A) NONE SEEN   WBC, UA 0-5 0 - 5 WBC/hpf   RBC / HPF 0-5 0 - 5 RBC/hpf   Bacteria, UA FEW (A) NONE SEEN  Wet prep, genital   Collection Time: 12/20/15  6:10 PM  Result Value Ref Range    Yeast Wet Prep HPF POC NONE SEEN NONE SEEN   Trich, Wet Prep NONE SEEN NONE SEEN   Clue Cells Wet Prep HPF POC NONE SEEN NONE SEEN   WBC, Wet Prep HPF POC MANY (A) NONE SEEN   Sperm NONE SEEN     Imaging Studies:  [place] Mfm Fetal Bpp Wo Non Stress  12/20/2015  OBSTETRICAL ULTRASOUND: This exam was performed within a [facility]. The OB US report was generated in the AS system, and faxed to the ordering physician.  This report is available in the [employer]. See the AS Obstetric US report via the Image Link.  [place] Mfm Fetal Bpp Wo Non Stress  12/18/2015  OBSTETRICAL ULTRASOUND: This exam was performed within a [facility]. The OB US report was generated in the AS system, and faxed to the ordering physician.  This report is available in the [employer]. See the AS Obstetric US report via the Image Link.  [place] Mfm Ob Follow Up  12/07/2015  OBSTETRICAL ULTRASOUND: This exam was performed within a [facility]. The OB US report was generated in the AS system, and faxed to the ordering physician.  This report is available in the [employer]. See the AS Obstetric US report via the Image Link.  [place] Mfm Ob Limited  12/18/2015  OBSTETRICAL ULTRASOUND: This exam was performed within a  Ultrasound Department. The OB US report was generated in the AS system, and faxed to the ordering physician.  This report is available in the [employer]. See the AS Obstetric US report via the Image Link.   Assessment: Laurie [name] is  20 y.o. G2P0010 at [redacted]w[redacted]d presents with vaginal spotting. No bleeding seen on exam and report is mild spotting vs brown discharge. Likely small amount of bleeding from cervical check yesterday. Wet prep negative. G/C obtained. NST reactive.  Plan: - home w/ bleeding and labor precautions  Silvano Bilis 3/1/20176:58 PM

## 2015-12-20 NOTE — MAU Note (Signed)
Pt presents to MAU with complaints of brown vaginal spotting since last night. Denies any abdominal pain, mild back pain.

## 2015-12-21 LAB — GC/CHLAMYDIA PROBE AMP (~~LOC~~) NOT AT ARMC
CHLAMYDIA, DNA PROBE: NEGATIVE
NEISSERIA GONORRHEA: NEGATIVE

## 2015-12-22 ENCOUNTER — Encounter (HOSPITAL_COMMUNITY): Payer: Self-pay

## 2015-12-22 ENCOUNTER — Ambulatory Visit (HOSPITAL_COMMUNITY)
Admission: RE | Admit: 2015-12-22 | Discharge: 2015-12-22 | Disposition: A | Discharge status: Home / Self Care | Payer: Medicaid Other | Source: Ambulatory Visit | Attending: Obstetrics | Admitting: Obstetrics

## 2015-12-22 DIAGNOSIS — Z3A36 36 weeks gestation of pregnancy: Secondary | ICD-10-CM | POA: Insufficient documentation

## 2015-12-22 DIAGNOSIS — O24419 Gestational diabetes mellitus in pregnancy, unspecified control: Secondary | ICD-10-CM

## 2015-12-22 DIAGNOSIS — O98413 Viral hepatitis complicating pregnancy, third trimester: Secondary | ICD-10-CM | POA: Diagnosis not present

## 2015-12-22 DIAGNOSIS — B182 Chronic viral hepatitis C: Secondary | ICD-10-CM | POA: Diagnosis not present

## 2015-12-22 DIAGNOSIS — O24415 Gestational diabetes mellitus in pregnancy, controlled by oral hypoglycemic drugs: Secondary | ICD-10-CM | POA: Insufficient documentation

## 2015-12-22 DIAGNOSIS — O358XX0 Maternal care for other (suspected) fetal abnormality and damage, not applicable or unspecified: Secondary | ICD-10-CM | POA: Diagnosis not present

## 2015-12-22 LAB — CULTURE, OB URINE

## 2015-12-22 NOTE — ED Notes (Signed)
Pt reports leaking a clear/yellowish, watery fluid intermittently since 3/2.

## 2015-12-26 ENCOUNTER — Encounter: Payer: Self-pay | Admitting: Certified Nurse Midwife

## 2015-12-26 ENCOUNTER — Ambulatory Visit (INDEPENDENT_AMBULATORY_CARE_PROVIDER_SITE_OTHER): Payer: Medicaid Other | Admitting: Certified Nurse Midwife

## 2015-12-26 VITALS — BP 113/69 | HR 99 | Temp 98.9°F | Wt 150.0 lb

## 2015-12-26 DIAGNOSIS — O99323 Drug use complicating pregnancy, third trimester: Secondary | ICD-10-CM

## 2015-12-26 DIAGNOSIS — O0993 Supervision of high risk pregnancy, unspecified, third trimester: Secondary | ICD-10-CM

## 2015-12-26 DIAGNOSIS — R197 Diarrhea, unspecified: Secondary | ICD-10-CM

## 2015-12-26 MED ORDER — LOPERAMIDE HCL 2 MG PO TABS: 2.0000 mg | ORAL_TABLET | Freq: Four times a day (QID) | ORAL | Status: DC | PRN

## 2015-12-27 ENCOUNTER — Inpatient Hospital Stay (HOSPITAL_COMMUNITY)
Admission: AD | Admit: 2015-12-27 | Discharge: 2015-12-27 | Disposition: A | Discharge status: Home / Self Care | Payer: Medicaid Other | Source: Ambulatory Visit | Attending: Obstetrics | Admitting: Obstetrics

## 2015-12-27 ENCOUNTER — Encounter (HOSPITAL_COMMUNITY): Payer: Self-pay

## 2015-12-27 DIAGNOSIS — O99333 Smoking (tobacco) complicating pregnancy, third trimester: Secondary | ICD-10-CM | POA: Insufficient documentation

## 2015-12-27 DIAGNOSIS — Z3A37 37 weeks gestation of pregnancy: Secondary | ICD-10-CM | POA: Insufficient documentation

## 2015-12-27 DIAGNOSIS — Z880 Allergy status to penicillin: Secondary | ICD-10-CM | POA: Insufficient documentation

## 2015-12-27 DIAGNOSIS — F17210 Nicotine dependence, cigarettes, uncomplicated: Secondary | ICD-10-CM | POA: Insufficient documentation

## 2015-12-27 DIAGNOSIS — O24419 Gestational diabetes mellitus in pregnancy, unspecified control: Secondary | ICD-10-CM | POA: Diagnosis not present

## 2015-12-27 DIAGNOSIS — F329 Major depressive disorder, single episode, unspecified: Secondary | ICD-10-CM | POA: Insufficient documentation

## 2015-12-27 DIAGNOSIS — O26893 Other specified pregnancy related conditions, third trimester: Secondary | ICD-10-CM | POA: Diagnosis not present

## 2015-12-27 DIAGNOSIS — N898 Other specified noninflammatory disorders of vagina: Secondary | ICD-10-CM | POA: Diagnosis not present

## 2015-12-27 LAB — POCT FERN TEST: POCT FERN TEST: NEGATIVE

## 2015-12-27 NOTE — MAU Provider Note (Signed)
History   [id number]   Chief Complaint  Patient presents with  . possible rupture of membranes   . Abdominal Pain    HPI Laurie [name] is a 20 y.o. female  G2P0010 here with report of watery vaginal discharge that began at approximately 2 am this morning.  Leaking of fluid has continued intermittently.  Pt reports abdominal cramping and denies vaginal bleeding.  +fetal movement.   All other systems negative.    Patient's last menstrual period was 04/21/2015.  OB History  Gravida Para Term Preterm AB SAB TAB Ectopic Multiple Living     # Outcome Date GA Lbr Len/2nd Weight Sex Delivery Anes PTL Lv  2 Current           1 SAB               Past Medical History  Diagnosis Date  . Chlamydia   . Pregnant   . History of heroin abuse   . Asthma     "grew out"  . Hepatitis C June 2016  . Infection     UTI  . Anxiety   . Depression     doing really good  . Gestational diabetes     Family History  Problem Relation Age of Onset  . Diabetes Mother   . Pulmonary embolism Mother   . Stroke Mother   . Epilepsy Brother   . Cancer Paternal Grandmother     liver    Social History   Social History  . Marital Status: Single    Spouse Name: N/A  . Number of Children: N/A  . Years of Education: N/A   Social History Main Topics  . Smoking status: Current Some Day Smoker -- 0.50 packs/day    Types: Cigarettes  . Smokeless tobacco: Never Used     Comment: quit with + preg test  . Alcohol Use: No     Comment: Occas use.; none since mid Sept  . Drug Use: No     Comment: patient states Marijuana last use 07/13/2015 and last use of Heroin was July 2016.  Marland Kitchen Sexual Activity: Yes    Birth Control/ Protection: None     Comment: last intercourse last month 06/2015   Other Topics Concern  . None   Social History Narrative    Allergies  Allergen Reactions  . Penicillins Other (See Comments)    Results thru an allergy skin test Has patient had a PCN  reaction causing immediate rash, facial/tongue/throat swelling, SOB or lightheadedness with hypotension: No Has patient had a PCN reaction causing severe rash involving mucus membranes or skin necrosis: No Has patient had a PCN reaction that required hospitalization No Has patient had a PCN reaction occurring within the last 10 years: No If all of the above answers are "NO", then may proceed with Cephalosporin use.      No current facility-administered medications on file prior to encounter.   Current Outpatient Prescriptions on File Prior to Encounter  Medication Sig Dispense Refill  . Blood Glucose Monitoring Suppl (ACCU-CHEK AVIVA) device Patient to check blood sugars four times daily. 1 each 0  . glucose blood (ACCU-CHEK AVIVA) test strip Patient to check blood sugars four times daily. 100 each 12  . Lancets (ACCU-CHEK MULTICLIX) lancets Patient to check blood sugar 4 times daily. 100 each 12  . ondansetron (ZOFRAN ODT) 8 MG disintegrating tablet Take 1 tablet (8 mg total) by mouth  every 8 (eight) hours as needed for nausea or vomiting. 20 tablet 0     Review of Systems  Constitutional: Negative.   Gastrointestinal: Positive for abdominal pain.  Genitourinary: Positive for vaginal discharge. Negative for vaginal bleeding.     Physical Exam   Filed Vitals:   12/27/15 1703  BP: 117/65  Pulse: 96  Temp: 98 F (36.7 C)  TempSrc: Oral  Resp: 18  Height: 5\' 3"  (1.6 m)  Weight: 150 lb (68.04 kg)    Physical Exam  Nursing note and vitals reviewed. Constitutional: She is oriented to person, place, and time. She appears well-developed and well-nourished. No distress.  HENT:  Head: Normocephalic and atraumatic.  Eyes: Conjunctivae are normal. Right eye exhibits no discharge. Left eye exhibits no discharge. No scleral icterus.  Neck: Normal range of motion.  Cardiovascular: Normal rate.   Respiratory: Effort normal. No respiratory distress.  Genitourinary: No bleeding in the  vagina. Vaginal discharge (small amount of thin white discharge) found.  No pooling  Neurological: She is alert and oriented to person, place, and time.  Skin: Skin is warm and dry. She is not diaphoretic.  Psychiatric: She has a normal mood and affect. Her behavior is normal. Judgment and thought content normal.   Fetal Tracing:  Baseline: 135 Variability: moderate Accelerations: 15x15 Decelerations: none  Toco: irregular   MAU Course  Procedures  MDM Category 1 tracing No pooling, fern negative RN will call Dr. Clearance CootsHarper for further instruction  Assessment and Plan  A: Vaginal discharge in pregnancy  P: RN to call Dr. Clearance CootsHarper for further instruction   Judeth HornErin Humbert Morozov, NP 12/27/2015 5:27 PM

## 2015-12-27 NOTE — MAU Note (Signed)
Pt c/o waking up in a puddle of clear fluid around 0200am today. Pt states that she has felt several more small gushes of fluid throughout the day. Pt denies bleeding and contractions. Pt states baby is moving normally.

## 2015-12-27 NOTE — Progress Notes (Signed)
Subjective:    Laurie [name] is a 20 y.o. female being seen today for her obstetrical visit. She is at 5779w0d gestation. Patient reports no bleeding, no leaking, occasional contractions and diarrhea for over 2 weeks, has not tried anything for it. Fetal movement: normal.  Does have recent dx of GDM, states that her blood sugars are normalizing.    Problem List Items Addressed This Visit    None    Visit Diagnoses    Diarrhea, unspecified type    -  Primary    Relevant Medications    loperamide (IMODIUM A-D) 2 MG tablet    Other Relevant Orders    Stool Culture    Stool C-Diff Toxin Assay    Stool Giardia/Cryptosporidium    Stool, WBC/Lactoferrin    Supervision of high risk pregnancy in third trimester          Patient Active Problem List   Diagnosis Date Noted  . Polysubstance abuse 08/31/2015    Objective:    BP 113/69 mmHg  Pulse 99  Temp(Src) 98.9 F (37.2 C)  Wt 150 lb (68.04 kg)  LMP 04/21/2015 FHT: 150 BPM  Uterine Size: size greater than dates  Presentations: cephalic  Pelvic Exam:              Dilation: Closed       Effacement: Long             Station:  -3    Consistency: soft            Position: posterior   NST: Cat. 1 tracing, moderate variability, + accels, no decels. Occasional contraction noted.  Patient is not feeling the contractions.    Assessment:    Pregnancy @ 6879w0d weeks   Reactive NST  GDM  LGA Plan:   Plans for delivery: Vaginal anticipated; labs reviewed; problem list updated Counseling: Consent signed. Infant feeding: plans to breastfeed. Cigarette smoking: smokes. L&D discussion: symptoms of labor, discussed when to call, discussed what number to call, anesthetic/analgesic options reviewed and delivering clinician:  plans no preference. Postpartum supports and preparation: circumcision discussed and contraception plans discussed.  Follow up in 1 Week with NST.

## 2015-12-29 ENCOUNTER — Ambulatory Visit (HOSPITAL_COMMUNITY): Payer: Medicaid Other | Attending: Certified Nurse Midwife

## 2015-12-29 ENCOUNTER — Ambulatory Visit (HOSPITAL_COMMUNITY): Admission: RE | Admit: 2015-12-29 | Payer: Medicaid Other | Source: Ambulatory Visit

## 2016-01-01 ENCOUNTER — Inpatient Hospital Stay (HOSPITAL_COMMUNITY)
Admission: AD | Admit: 2016-01-01 | Discharge: 2016-01-01 | Disposition: A | Discharge status: Home / Self Care | Payer: Medicaid Other | Source: Ambulatory Visit | Attending: Obstetrics | Admitting: Obstetrics

## 2016-01-01 ENCOUNTER — Encounter (HOSPITAL_COMMUNITY): Payer: Self-pay

## 2016-01-01 DIAGNOSIS — Z3493 Encounter for supervision of normal pregnancy, unspecified, third trimester: Secondary | ICD-10-CM | POA: Diagnosis not present

## 2016-01-01 NOTE — MAU Note (Signed)
Regular contractions since 2, now about every 5 min. No leaking or bleeding, has been  Having a lot of mucous d/c

## 2016-01-01 NOTE — Discharge Instructions (Signed)
Braxton Hicks Contractions °Contractions of the uterus can occur throughout pregnancy. Contractions are not always a sign that you are in labor.  °WHAT ARE BRAXTON HICKS CONTRACTIONS?  °Contractions that occur before labor are called Braxton Hicks contractions, or false labor. Toward the end of pregnancy (32-34 weeks), these contractions can develop more often and may become more forceful. This is not true labor because these contractions do not result in opening (dilatation) and thinning of the cervix. They are sometimes difficult to tell apart from true labor because these contractions can be forceful and people have different pain tolerances. You should not feel embarrassed if you go to the hospital with false labor. Sometimes, the only way to tell if you are in true labor is for your health care provider to look for changes in the cervix. °If there are no prenatal problems or other health problems associated with the pregnancy, it is completely safe to be sent home with false labor and await the onset of true labor. °HOW CAN YOU TELL THE DIFFERENCE BETWEEN TRUE AND FALSE LABOR? °False Labor °· The contractions of false labor are usually shorter and not as hard as those of true labor.   °· The contractions are usually irregular.   °· The contractions are often felt in the front of the lower abdomen and in the groin.   °· The contractions may go away when you walk around or change positions while lying down.   °· The contractions get weaker and are shorter lasting as time goes on.   °· The contractions do not usually become progressively stronger, regular, and closer together as with true labor.   °True Labor °· Contractions in true labor last 30-70 seconds, become very regular, usually become more intense, and increase in frequency.   °· The contractions do not go away with walking.   °· The discomfort is usually felt in the top of the uterus and spreads to the lower abdomen and low back.   °· True labor can be  determined by your health care provider with an exam. This will show that the cervix is dilating and getting thinner.   °WHAT TO REMEMBER °· Keep up with your usual exercises and follow other instructions given by your health care provider.   °· Take medicines as directed by your health care provider.   °· Keep your regular prenatal appointments.   °· Eat and drink lightly if you think you are going into labor.   °· If Braxton Hicks contractions are making you uncomfortable:   °¨ Change your position from lying down or resting to walking, or from walking to resting.   °¨ Sit and rest in a tub of warm water.   °¨ Drink 2-3 glasses of water. Dehydration may cause these contractions.   °¨ Do slow and deep breathing several times an hour.   °WHEN SHOULD I SEEK IMMEDIATE MEDICAL CARE? °Seek immediate medical care if: °· Your contractions become stronger, more regular, and closer together.   °· You have fluid leaking or gushing from your vagina.   °· You have a fever.   °· You pass blood-tinged mucus.   °· You have vaginal bleeding.   °· You have continuous abdominal pain.   °· You have low back pain that you never had before.   °· You feel your baby's head pushing down and causing pelvic pressure.   °· Your baby is not moving as much as it used to.   °  °This information is not intended to replace advice given to you by your health care provider. Make sure you discuss any questions you have with your health care   provider. °  °Document Released: 10/07/2005 Document Revised: 10/12/2013 Document Reviewed: 07/19/2013 °Elsevier Interactive Patient Education ©2016 [employer] °Fetal Movement Counts °Patient Name: __________________________________________________ Patient Due Date: ____________________ °Performing a fetal movement count is highly recommended in high-risk pregnancies, but it is good for every pregnant woman to do. Your health care provider may ask you to start counting fetal movements at 28 weeks of the  pregnancy. Fetal movements often increase: °· After eating a full meal. °· After physical activity. °· After eating or drinking something sweet or cold. °· At rest. °Pay attention to when you feel the baby is most active. This will help you notice a pattern of your baby's sleep and wake cycles and what factors contribute to an increase in fetal movement. It is important to perform a fetal movement count at the same time each day when your baby is normally most active.  °HOW TO COUNT FETAL MOVEMENTS °· Find a quiet and comfortable area to sit or lie down on your left side. Lying on your left side provides the best blood and oxygen circulation to your baby. °· Write down the day and time on a sheet of paper or in a journal. °· Start counting kicks, flutters, swishes, rolls, or jabs in a 2-hour period. You should feel at least 10 movements within 2 hours. °· If you do not feel 10 movements in 2 hours, wait 2-3 hours and count again. Look for a change in the pattern or not enough counts in 2 hours. °SEEK MEDICAL CARE IF: °· You feel less than 10 counts in 2 hours, tried twice. °· There is no movement in over an hour. °· The pattern is changing or taking longer each day to reach 10 counts in 2 hours. °· You feel the baby is not moving as he or she usually does. °Date: ____________ Movements: ____________ Start time: ____________ Finish time: ____________  °Date: ____________ Movements: ____________ Start time: ____________ Finish time: ____________ °Date: ____________ Movements: ____________ Start time: ____________ Finish time: ____________ °Date: ____________ Movements: ____________ Start time: ____________ Finish time: ____________ °Date: ____________ Movements: ____________ Start time: ____________ Finish time: ____________ °Date: ____________ Movements: ____________ Start time: ____________ Finish time: ____________ °Date: ____________ Movements: ____________ Start time: ____________ Finish time: ____________ °Date:  ____________ Movements: ____________ Start time: ____________ Finish time: ____________  °Date: ____________ Movements: ____________ Start time: ____________ Finish time: ____________ °Date: ____________ Movements: ____________ Start time: ____________ Finish time: ____________ °Date: ____________ Movements: ____________ Start time: ____________ Finish time: ____________ °Date: ____________ Movements: ____________ Start time: ____________ Finish time: ____________ °Date: ____________ Movements: ____________ Start time: ____________ Finish time: ____________ °Date: ____________ Movements: ____________ Start time: ____________ Finish time: ____________ °Date: ____________ Movements: ____________ Start time: ____________ Finish time: ____________  °Date: ____________ Movements: ____________ Start time: ____________ Finish time: ____________ °Date: ____________ Movements: ____________ Start time: ____________ Finish time: ____________ °Date: ____________ Movements: ____________ Start time: ____________ Finish time: ____________ °Date: ____________ Movements: ____________ Start time: ____________ Finish time: ____________ °Date: ____________ Movements: ____________ Start time: ____________ Finish time: ____________ °Date: ____________ Movements: ____________ Start time: ____________ Finish time: ____________ °Date: ____________ Movements: ____________ Start time: ____________ Finish time: ____________  °Date: ____________ Movements: ____________ Start time: ____________ Finish time: ____________ °Date: ____________ Movements: ____________ Start time: ____________ Finish time: ____________ °Date: ____________ Movements: ____________ Start time: ____________ Finish time: ____________ °Date: ____________ Movements: ____________ Start time: ____________ Finish time: ____________ °Date: ____________ Movements: ____________ Start time: ____________ Finish time: ____________ °Date: ____________ Movements: ____________ Start  time: ____________ Finish time:   ____________ °Date: ____________ Movements: ____________ Start time: ____________ Finish time: ____________  °Date: ____________ Movements: ____________ Start time: ____________ Finish time: ____________ °Date: ____________ Movements: ____________ Start time: ____________ Finish time: ____________ °Date: ____________ Movements: ____________ Start time: ____________ Finish time: ____________ °Date: ____________ Movements: ____________ Start time: ____________ Finish time: ____________ °Date: ____________ Movements: ____________ Start time: ____________ Finish time: ____________ °Date: ____________ Movements: ____________ Start time: ____________ Finish time: ____________ °Date: ____________ Movements: ____________ Start time: ____________ Finish time: ____________  °Date: ____________ Movements: ____________ Start time: ____________ Finish time: ____________ °Date: ____________ Movements: ____________ Start time: ____________ Finish time: ____________ °Date: ____________ Movements: ____________ Start time: ____________ Finish time: ____________ °Date: ____________ Movements: ____________ Start time: ____________ Finish time: ____________ °Date: ____________ Movements: ____________ Start time: ____________ Finish time: ____________ °Date: ____________ Movements: ____________ Start time: ____________ Finish time: ____________ °Date: ____________ Movements: ____________ Start time: ____________ Finish time: ____________  °Date: ____________ Movements: ____________ Start time: ____________ Finish time: ____________ °Date: ____________ Movements: ____________ Start time: ____________ Finish time: ____________ °Date: ____________ Movements: ____________ Start time: ____________ Finish time: ____________ °Date: ____________ Movements: ____________ Start time: ____________ Finish time: ____________ °Date: ____________ Movements: ____________ Start time: ____________ Finish time:  ____________ °Date: ____________ Movements: ____________ Start time: ____________ Finish time: ____________ °Date: ____________ Movements: ____________ Start time: ____________ Finish time: ____________  °Date: ____________ Movements: ____________ Start time: ____________ Finish time: ____________ °Date: ____________ Movements: ____________ Start time: ____________ Finish time: ____________ °Date: ____________ Movements: ____________ Start time: ____________ Finish time: ____________ °Date: ____________ Movements: ____________ Start time: ____________ Finish time: ____________ °Date: ____________ Movements: ____________ Start time: ____________ Finish time: ____________ °Date: ____________ Movements: ____________ Start time: ____________ Finish time: ____________ °  °This information is not intended to replace advice given to you by your health care provider. Make sure you discuss any questions you have with your health care provider. °  °Document Released: 11/06/2006 Document Revised: 10/28/2014 Document Reviewed: 08/03/2012 °Elsevier Interactive Patient Education ©2016 [employer] ° °

## 2016-01-02 ENCOUNTER — Encounter: Payer: Medicaid Other | Admitting: Certified Nurse Midwife

## 2016-01-02 ENCOUNTER — Encounter (HOSPITAL_COMMUNITY): Payer: Self-pay | Admitting: *Deleted

## 2016-01-02 ENCOUNTER — Ambulatory Visit (INDEPENDENT_AMBULATORY_CARE_PROVIDER_SITE_OTHER): Payer: Medicaid Other | Admitting: Certified Nurse Midwife

## 2016-01-02 ENCOUNTER — Inpatient Hospital Stay (HOSPITAL_COMMUNITY)
Admission: AD | Admit: 2016-01-02 | Discharge: 2016-01-02 | Disposition: A | Discharge status: Home / Self Care | Payer: Medicaid Other | Source: Ambulatory Visit | Attending: Obstetrics | Admitting: Obstetrics

## 2016-01-02 VITALS — BP 117/66 | HR 89 | Wt 154.0 lb

## 2016-01-02 DIAGNOSIS — O0993 Supervision of high risk pregnancy, unspecified, third trimester: Secondary | ICD-10-CM

## 2016-01-02 DIAGNOSIS — Z3493 Encounter for supervision of normal pregnancy, unspecified, third trimester: Secondary | ICD-10-CM | POA: Diagnosis not present

## 2016-01-02 DIAGNOSIS — O24414 Gestational diabetes mellitus in pregnancy, insulin controlled: Secondary | ICD-10-CM

## 2016-01-02 MED ORDER — LACTATED RINGERS IV BOLUS (SEPSIS): 1000.0000 mL | Freq: Once | INTRAVENOUS | Status: DC

## 2016-01-02 MED ORDER — FENTANYL CITRATE (PF) 100 MCG/2ML IJ SOLN
100.0000 ug | Freq: Once | INTRAMUSCULAR | Status: AC
  Administered 2016-01-02: 100 ug via INTRAVENOUS
  Filled 2016-01-02: qty 2

## 2016-01-02 NOTE — Discharge Instructions (Signed)
Braxton Hicks Contractions °Contractions of the uterus can occur throughout pregnancy. Contractions are not always a sign that you are in labor.  °WHAT ARE BRAXTON HICKS CONTRACTIONS?  °Contractions that occur before labor are called Braxton Hicks contractions, or false labor. Toward the end of pregnancy (32-34 weeks), these contractions can develop more often and may become more forceful. This is not true labor because these contractions do not result in opening (dilatation) and thinning of the cervix. They are sometimes difficult to tell apart from true labor because these contractions can be forceful and people have different pain tolerances. You should not feel embarrassed if you go to the hospital with false labor. Sometimes, the only way to tell if you are in true labor is for your health care provider to look for changes in the cervix. °If there are no prenatal problems or other health problems associated with the pregnancy, it is completely safe to be sent home with false labor and await the onset of true labor. °HOW CAN YOU TELL THE DIFFERENCE BETWEEN TRUE AND FALSE LABOR? °False Labor °· The contractions of false labor are usually shorter and not as hard as those of true labor.   °· The contractions are usually irregular.   °· The contractions are often felt in the front of the lower abdomen and in the groin.   °· The contractions may go away when you walk around or change positions while lying down.   °· The contractions get weaker and are shorter lasting as time goes on.   °· The contractions do not usually become progressively stronger, regular, and closer together as with true labor.   °True Labor °· Contractions in true labor last 30-70 seconds, become very regular, usually become more intense, and increase in frequency.   °· The contractions do not go away with walking.   °· The discomfort is usually felt in the top of the uterus and spreads to the lower abdomen and low back.   °· True labor can be  determined by your health care provider with an exam. This will show that the cervix is dilating and getting thinner.   °WHAT TO REMEMBER °· Keep up with your usual exercises and follow other instructions given by your health care provider.   °· Take medicines as directed by your health care provider.   °· Keep your regular prenatal appointments.   °· Eat and drink lightly if you think you are going into labor.   °· If Braxton Hicks contractions are making you uncomfortable:   °¨ Change your position from lying down or resting to walking, or from walking to resting.   °¨ Sit and rest in a tub of warm water.   °¨ Drink 2-3 glasses of water. Dehydration may cause these contractions.   °¨ Do slow and deep breathing several times an hour.   °WHEN SHOULD I SEEK IMMEDIATE MEDICAL CARE? °Seek immediate medical care if: °· Your contractions become stronger, more regular, and closer together.   °· You have fluid leaking or gushing from your vagina.   °· You have a fever.   °· You pass blood-tinged mucus.   °· You have vaginal bleeding.   °· You have continuous abdominal pain.   °· You have low back pain that you never had before.   °· You feel your baby's head pushing down and causing pelvic pressure.   °· Your baby is not moving as much as it used to.   °  °This information is not intended to replace advice given to you by your health care provider. Make sure you discuss any questions you have with your health care   provider. °  °Document Released: 10/07/2005 Document Revised: 10/12/2013 Document Reviewed: 07/19/2013 °Elsevier Interactive Patient Education ©2016 [employer] ° °

## 2016-01-02 NOTE — MAU Note (Signed)
Pt sent from MD office for non-reactive NST, also having uc's, was seen in MAU last night - DC'd home.  Pt states she feels wet since cervical exam @ MD office - 1.5 cm's.  Denies bleeding.

## 2016-01-02 NOTE — Progress Notes (Signed)
Pt is having increase in pain, pelvic and back.  Pt is having irregular ctx.  [facility]

## 2016-01-02 NOTE — Progress Notes (Signed)
Subjective:    Laurie [name] is a 20 y.o. female being seen today for her obstetrical visit. She is at 4660w6d gestation. Patient reports backache and contractions since yesterday. Fetal movement: normal.  Was seen yesterday in MAU for contractions that were irregular and vaginal discharge.    Problem List Items Addressed This Visit    None    Visit Diagnoses    Supervision of high risk pregnancy in third trimester    -  Primary    Relevant Orders    AMB referral to maternal fetal medicine    US MFM OB FOLLOW UP    Insulin controlled gestational diabetes mellitus (GDM) in third trimester        Relevant Orders    AMB referral to maternal fetal medicine    US MFM OB FOLLOW UP      Patient Active Problem List   Diagnosis Date Noted  . Polysubstance abuse 08/31/2015    Objective:    BP 117/66 mmHg  Pulse 89  Wt 154 lb (69.854 kg)  LMP 04/21/2015 FHT: 140 BPM  Uterine Size: size greater than dates  Presentations: cephalic  Pelvic Exam:              Dilation: 1cm       Effacement: 25%             Station:  -3    Consistency: soft            Position: middle   Bloody show on glove.  NST: Toco: contractions every 3 minutes.  No accels minimal variability.  No decels.   Assessment:    Pregnancy @ 1860w6d weeks   Contractions at term  GDM  Non-reactive NST  Hx of mariajuana use this pregnancy  LGA  Plan:    Sent to MAU for evaluation Plans for delivery: Vaginal anticipated; labs reviewed; problem list updated Counseling: Consent signed. Infant feeding: plans to breastfeed. Cigarette smoking: quit at start of pregnancy. L&D discussion: symptoms of labor, discussed when to call, discussed what number to call, anesthetic/analgesic options reviewed and delivering clinician:  plans Certified Nurse-Midwife. Postpartum supports and preparation: circumcision discussed and contraception plans discussed.  Follow up in 1 Week with NST.

## 2016-01-03 ENCOUNTER — Other Ambulatory Visit: Payer: Self-pay | Admitting: Certified Nurse Midwife

## 2016-01-05 ENCOUNTER — Other Ambulatory Visit: Payer: Self-pay | Admitting: Certified Nurse Midwife

## 2016-01-05 ENCOUNTER — Encounter (HOSPITAL_COMMUNITY): Payer: Self-pay

## 2016-01-05 ENCOUNTER — Ambulatory Visit (HOSPITAL_COMMUNITY)
Admission: RE | Admit: 2016-01-05 | Discharge: 2016-01-05 | Disposition: A | Discharge status: Home / Self Care | Payer: Medicaid Other | Source: Ambulatory Visit | Attending: Certified Nurse Midwife | Admitting: Certified Nurse Midwife

## 2016-01-05 DIAGNOSIS — O98413 Viral hepatitis complicating pregnancy, third trimester: Secondary | ICD-10-CM

## 2016-01-05 DIAGNOSIS — Z3A38 38 weeks gestation of pregnancy: Secondary | ICD-10-CM

## 2016-01-05 DIAGNOSIS — B182 Chronic viral hepatitis C: Secondary | ICD-10-CM

## 2016-01-05 DIAGNOSIS — O0993 Supervision of high risk pregnancy, unspecified, third trimester: Secondary | ICD-10-CM

## 2016-01-05 DIAGNOSIS — O24415 Gestational diabetes mellitus in pregnancy, controlled by oral hypoglycemic drugs: Secondary | ICD-10-CM

## 2016-01-05 DIAGNOSIS — Z9111 Patient's noncompliance with dietary regimen: Secondary | ICD-10-CM | POA: Insufficient documentation

## 2016-01-05 DIAGNOSIS — O24410 Gestational diabetes mellitus in pregnancy, diet controlled: Secondary | ICD-10-CM | POA: Diagnosis not present

## 2016-01-05 DIAGNOSIS — F1920 Other psychoactive substance dependence, uncomplicated: Secondary | ICD-10-CM

## 2016-01-05 DIAGNOSIS — O358XX0 Maternal care for other (suspected) fetal abnormality and damage, not applicable or unspecified: Secondary | ICD-10-CM | POA: Insufficient documentation

## 2016-01-05 DIAGNOSIS — O99320 Drug use complicating pregnancy, unspecified trimester: Secondary | ICD-10-CM

## 2016-01-05 DIAGNOSIS — O99323 Drug use complicating pregnancy, third trimester: Secondary | ICD-10-CM | POA: Diagnosis not present

## 2016-01-05 DIAGNOSIS — O24414 Gestational diabetes mellitus in pregnancy, insulin controlled: Secondary | ICD-10-CM

## 2016-01-09 ENCOUNTER — Encounter: Payer: Medicaid Other | Admitting: Obstetrics

## 2016-01-09 ENCOUNTER — Telehealth (HOSPITAL_COMMUNITY): Payer: Self-pay | Admitting: *Deleted

## 2016-01-09 ENCOUNTER — Ambulatory Visit (INDEPENDENT_AMBULATORY_CARE_PROVIDER_SITE_OTHER): Payer: Medicaid Other | Admitting: Obstetrics

## 2016-01-09 ENCOUNTER — Other Ambulatory Visit: Payer: Self-pay | Admitting: Certified Nurse Midwife

## 2016-01-09 ENCOUNTER — Other Ambulatory Visit: Payer: Self-pay | Admitting: *Deleted

## 2016-01-09 ENCOUNTER — Encounter: Payer: Medicaid Other | Admitting: Certified Nurse Midwife

## 2016-01-09 VITALS — BP 123/70 | HR 79

## 2016-01-09 DIAGNOSIS — O24410 Gestational diabetes mellitus in pregnancy, diet controlled: Secondary | ICD-10-CM

## 2016-01-09 DIAGNOSIS — O3663X1 Maternal care for excessive fetal growth, third trimester, fetus 1: Secondary | ICD-10-CM

## 2016-01-09 DIAGNOSIS — Z3403 Encounter for supervision of normal first pregnancy, third trimester: Secondary | ICD-10-CM

## 2016-01-09 LAB — POCT URINALYSIS DIPSTICK
Bilirubin, UA: NEGATIVE
Ketones, UA: NEGATIVE
Leukocytes, UA: NEGATIVE
NITRITE UA: NEGATIVE
PROTEIN UA: NEGATIVE
RBC UA: NEGATIVE
Spec Grav, UA: 1.025
UROBILINOGEN UA: 4
pH, UA: 6

## 2016-01-09 NOTE — Telephone Encounter (Signed)
Preadmission screen  

## 2016-01-10 ENCOUNTER — Encounter (HOSPITAL_COMMUNITY): Payer: Self-pay

## 2016-01-10 ENCOUNTER — Encounter: Payer: Self-pay | Admitting: Obstetrics

## 2016-01-10 ENCOUNTER — Inpatient Hospital Stay (HOSPITAL_COMMUNITY)
Admission: RE | Admit: 2016-01-10 | Discharge: 2016-01-14 | DRG: 765 | Disposition: A | Discharge status: Home / Self Care | Payer: Medicaid Other | Source: Ambulatory Visit | Attending: Obstetrics | Admitting: Obstetrics

## 2016-01-10 DIAGNOSIS — O98413 Viral hepatitis complicating pregnancy, third trimester: Secondary | ICD-10-CM | POA: Diagnosis present

## 2016-01-10 DIAGNOSIS — O99334 Smoking (tobacco) complicating childbirth: Secondary | ICD-10-CM | POA: Diagnosis present

## 2016-01-10 DIAGNOSIS — Z833 Family history of diabetes mellitus: Secondary | ICD-10-CM | POA: Diagnosis not present

## 2016-01-10 DIAGNOSIS — B1920 Unspecified viral hepatitis C without hepatic coma: Secondary | ICD-10-CM | POA: Diagnosis present

## 2016-01-10 DIAGNOSIS — O24429 Gestational diabetes mellitus in childbirth, unspecified control: Secondary | ICD-10-CM | POA: Diagnosis present

## 2016-01-10 DIAGNOSIS — K831 Obstruction of bile duct: Secondary | ICD-10-CM | POA: Diagnosis present

## 2016-01-10 DIAGNOSIS — O3660X0 Maternal care for excessive fetal growth, unspecified trimester, not applicable or unspecified: Secondary | ICD-10-CM | POA: Diagnosis present

## 2016-01-10 DIAGNOSIS — D62 Acute posthemorrhagic anemia: Secondary | ICD-10-CM | POA: Diagnosis not present

## 2016-01-10 DIAGNOSIS — O99344 Other mental disorders complicating childbirth: Secondary | ICD-10-CM | POA: Diagnosis present

## 2016-01-10 DIAGNOSIS — O9081 Anemia of the puerperium: Secondary | ICD-10-CM | POA: Diagnosis not present

## 2016-01-10 DIAGNOSIS — F17210 Nicotine dependence, cigarettes, uncomplicated: Secondary | ICD-10-CM | POA: Diagnosis present

## 2016-01-10 DIAGNOSIS — O2662 Liver and biliary tract disorders in childbirth: Secondary | ICD-10-CM | POA: Diagnosis present

## 2016-01-10 DIAGNOSIS — Z820 Family history of epilepsy and other diseases of the nervous system: Secondary | ICD-10-CM

## 2016-01-10 DIAGNOSIS — Z3A39 39 weeks gestation of pregnancy: Secondary | ICD-10-CM

## 2016-01-10 DIAGNOSIS — Z823 Family history of stroke: Secondary | ICD-10-CM

## 2016-01-10 DIAGNOSIS — O26613 Liver and biliary tract disorders in pregnancy, third trimester: Secondary | ICD-10-CM

## 2016-01-10 DIAGNOSIS — F418 Other specified anxiety disorders: Secondary | ICD-10-CM | POA: Diagnosis present

## 2016-01-10 DIAGNOSIS — Z98891 History of uterine scar from previous surgery: Secondary | ICD-10-CM

## 2016-01-10 DIAGNOSIS — O26643 Intrahepatic cholestasis of pregnancy, third trimester: Secondary | ICD-10-CM | POA: Diagnosis present

## 2016-01-10 LAB — COMPREHENSIVE METABOLIC PANEL
ALBUMIN: 3.1 g/dL — AB (ref 3.5–5.5)
ALK PHOS: 271 IU/L — AB (ref 39–117)
ALT: 74 IU/L — AB (ref 0–32)
AST: 66 IU/L — ABNORMAL HIGH (ref 0–40)
Albumin/Globulin Ratio: 1 — ABNORMAL LOW (ref 1.2–2.2)
BILIRUBIN TOTAL: 0.5 mg/dL (ref 0.0–1.2)
BUN / CREAT RATIO: 18 (ref 8–20)
BUN: 13 mg/dL (ref 6–20)
CHLORIDE: 103 mmol/L (ref 96–106)
CO2: 19 mmol/L (ref 18–29)
CREATININE: 0.72 mg/dL (ref 0.57–1.00)
Calcium: 8.8 mg/dL (ref 8.7–10.2)
GFR calc Af Amer: 139 mL/min/{1.73_m2} (ref >59)
GFR calc non Af Amer: 121 mL/min/{1.73_m2} (ref >59)
GLUCOSE: 73 mg/dL (ref 65–99)
Globulin, Total: 3.1 g/dL (ref 1.5–4.5)
Potassium: 4.8 mmol/L (ref 3.5–5.2)
Sodium: 140 mmol/L (ref 134–144)
TOTAL PROTEIN: 6.2 g/dL (ref 6.0–8.5)

## 2016-01-10 LAB — CBC WITH DIFFERENTIAL/PLATELET
BASOS ABS: 0.1 10*3/uL (ref 0.0–0.2)
Basos: 1 %
EOS (ABSOLUTE): 0.1 10*3/uL (ref 0.0–0.4)
Eos: 1 %
HEMOGLOBIN: 10.3 g/dL — AB (ref 11.1–15.9)
Hematocrit: 31.7 % — ABNORMAL LOW (ref 34.0–46.6)
IMMATURE GRANS (ABS): 0 10*3/uL (ref 0.0–0.1)
Immature Granulocytes: 0 %
LYMPHS: 39 %
Lymphocytes Absolute: 2.8 10*3/uL (ref 0.7–3.1)
MCH: 27.2 pg (ref 26.6–33.0)
MCHC: 32.5 g/dL (ref 31.5–35.7)
MCV: 84 fL (ref 79–97)
MONOCYTES: 9 %
Monocytes Absolute: 0.7 10*3/uL (ref 0.1–0.9)
NEUTROS PCT: 50 %
Neutrophils Absolute: 3.7 10*3/uL (ref 1.4–7.0)
Platelets: 146 10*3/uL — ABNORMAL LOW (ref 150–379)
RBC: 3.79 x10E6/uL (ref 3.77–5.28)
RDW: 13.6 % (ref 12.3–15.4)
WBC: 7.3 10*3/uL (ref 3.4–10.8)

## 2016-01-10 LAB — CBC
HCT: 30.4 % — ABNORMAL LOW (ref 36.0–46.0)
Hemoglobin: 9.9 g/dL — ABNORMAL LOW (ref 12.0–15.0)
MCH: 27.5 pg (ref 26.0–34.0)
MCHC: 32.6 g/dL (ref 30.0–36.0)
MCV: 84.4 fL (ref 78.0–100.0)
PLATELETS: 127 10*3/uL — AB (ref 150–400)
RBC: 3.6 MIL/uL — AB (ref 3.87–5.11)
RDW: 13.5 % (ref 11.5–15.5)
WBC: 8.9 10*3/uL (ref 4.0–10.5)

## 2016-01-10 LAB — HEPATIC FUNCTION PANEL: BILIRUBIN, DIRECT: 0.23 mg/dL (ref 0.00–0.40)

## 2016-01-10 LAB — TYPE AND SCREEN
ABO/RH(D): A NEG
Antibody Screen: NEGATIVE

## 2016-01-10 LAB — RPR: RPR: NONREACTIVE

## 2016-01-10 MED ORDER — MISOPROSTOL 25 MCG QUARTER TABLET
25.0000 ug | ORAL_TABLET | ORAL | Status: DC | PRN
  Filled 2016-01-10: qty 1

## 2016-01-10 MED ORDER — LIDOCAINE HCL (PF) 1 % IJ SOLN
30.0000 mL | INTRAMUSCULAR | Status: DC | PRN
  Filled 2016-01-10: qty 30

## 2016-01-10 MED ORDER — NALBUPHINE HCL 10 MG/ML IJ SOLN
10.0000 mg | INTRAMUSCULAR | Status: DC | PRN
  Administered 2016-01-10 (×2): 10 mg via INTRAMUSCULAR
  Filled 2016-01-10 (×3): qty 1

## 2016-01-10 MED ORDER — TERBUTALINE SULFATE 1 MG/ML IJ SOLN
0.2500 mg | Freq: Once | INTRAMUSCULAR | Status: DC | PRN
  Filled 2016-01-10: qty 1

## 2016-01-10 MED ORDER — OXYTOCIN BOLUS FROM INFUSION: 500.0000 mL | INTRAVENOUS | Status: DC

## 2016-01-10 MED ORDER — OXYCODONE-ACETAMINOPHEN 5-325 MG PO TABS: 2.0000 | ORAL_TABLET | ORAL | Status: DC | PRN

## 2016-01-10 MED ORDER — LACTATED RINGERS IV SOLN
INTRAVENOUS | Status: DC
Start: 2016-01-10 — End: 2016-01-12
  Administered 2016-01-10 – 2016-01-11 (×7): via INTRAVENOUS

## 2016-01-10 MED ORDER — CITRIC ACID-SODIUM CITRATE 334-500 MG/5ML PO SOLN
30.0000 mL | ORAL | Status: DC | PRN
  Filled 2016-01-10: qty 15

## 2016-01-10 MED ORDER — ACETAMINOPHEN 325 MG PO TABS
650.0000 mg | ORAL_TABLET | ORAL | Status: DC | PRN
Start: 2016-01-10 — End: 2016-01-12
  Administered 2016-01-10: 650 mg via ORAL
  Filled 2016-01-10: qty 2

## 2016-01-10 MED ORDER — PROMETHAZINE HCL 25 MG/ML IJ SOLN
12.5000 mg | Freq: Four times a day (QID) | INTRAMUSCULAR | Status: DC | PRN
  Administered 2016-01-10 (×2): 12.5 mg via INTRAVENOUS
  Filled 2016-01-10 (×2): qty 1

## 2016-01-10 MED ORDER — HYDROXYZINE HCL 50 MG PO TABS
50.0000 mg | ORAL_TABLET | Freq: Four times a day (QID) | ORAL | Status: DC | PRN
  Filled 2016-01-10: qty 1

## 2016-01-10 MED ORDER — BUTORPHANOL TARTRATE 1 MG/ML IJ SOLN: 1.0000 mg | INTRAMUSCULAR | Status: DC | PRN

## 2016-01-10 MED ORDER — OXYCODONE-ACETAMINOPHEN 5-325 MG PO TABS: 1.0000 | ORAL_TABLET | ORAL | Status: DC | PRN

## 2016-01-10 MED ORDER — NALBUPHINE HCL 10 MG/ML IJ SOLN
10.0000 mg | INTRAMUSCULAR | Status: DC | PRN
  Administered 2016-01-10 (×3): 10 mg via INTRAVENOUS
  Filled 2016-01-10 (×2): qty 1

## 2016-01-10 MED ORDER — ONDANSETRON HCL 4 MG/2ML IJ SOLN: 4.0000 mg | Freq: Four times a day (QID) | INTRAMUSCULAR | Status: DC | PRN

## 2016-01-10 MED ORDER — LACTATED RINGERS IV SOLN: 2.5000 [IU]/h | INTRAVENOUS | Status: DC

## 2016-01-10 MED ORDER — MISOPROSTOL 200 MCG PO TABS
50.0000 ug | ORAL_TABLET | ORAL | Status: DC
  Administered 2016-01-10 (×2): 50 ug via ORAL
  Filled 2016-01-10 (×3): qty 0.5

## 2016-01-10 MED ORDER — LACTATED RINGERS IV SOLN
500.0000 mL | INTRAVENOUS | Status: DC | PRN
  Administered 2016-01-11: 500 mL via INTRAVENOUS

## 2016-01-10 NOTE — Progress Notes (Signed)
Subjective:    Laurie [name] is a 20 y.o. female being seen today for her obstetrical visit. She is at 3258w0d gestation. Patient reports no complaints. Fetal movement: normal.  Problem List Items Addressed This Visit    None    Visit Diagnoses    Encounter for supervision of normal first pregnancy in third trimester    -  Primary    Relevant Orders    POCT urinalysis dipstick (Completed)    CBC with Differential/Platelet (Completed)    Comprehensive metabolic panel (Completed)    Hepatic function panel (Completed)      Patient Active Problem List   Diagnosis Date Noted  . Polysubstance abuse 08/31/2015    Objective:    BP 123/70 mmHg  Pulse 79  LMP 04/21/2015 FHT: 150 BPM  Uterine Size: size equals dates  Presentations: cephalic    Assessment:    Pregnancy @ 2658w0d weeks    GDM.  LGA fetus with EFW of 4200 grams.  IOL scheduled for 01-10-16, per recommendation or MFM, Dr. Sherrie Georgeecker on 01-05-16 consultation.  Plan:   Plans for delivery: Vaginal anticipated; labs reviewed; problem list updated Counseling: Consent signed. Infant feeding: plans to breastfeed. Cigarette smoking: never smoked. L&D discussion: symptoms of labor, discussed when to call, discussed what number to call, anesthetic/analgesic options reviewed and delivering clinician:  plans Certified Nurse-Midwife. Postpartum supports and preparation: circumcision discussed and contraception plans discussed.

## 2016-01-10 NOTE — H&P (Signed)
Laurie [name] is a 20 y.o. female presenting for IOL for cholestasis, LGA. Maternal Medical History:  Fetal activity: Perceived fetal activity is normal.   Last perceived fetal movement was within the past hour.    Prenatal complications: Cholestasis   Prenatal Complications - Diabetes: none.    OB History    Gravida Para Term Preterm AB TAB SAB Ectopic Multiple Living   2 0 0 0 1 0 1 0 0 0      Past Medical History  Diagnosis Date  . Chlamydia   . Pregnant   . History of heroin abuse   . Asthma     "grew out"  . Hepatitis C June 2016  . Infection     UTI  . Anxiety   . Depression     doing really good  . Gestational diabetes    Past Surgical History  Procedure Laterality Date  . No past surgeries     Family History: family history includes Cancer in her paternal grandmother; Diabetes in her mother; Epilepsy in her brother; Pulmonary embolism in her mother; Stroke in her mother. Social History:  reports that she has been smoking Cigarettes.  She has been smoking about 0.50 packs per day. She has never used smokeless tobacco. She reports that she does not drink alcohol or use illicit drugs.   Prenatal Transfer Tool  Maternal Diabetes: No Genetic Screening: Normal Maternal Ultrasounds/Referrals: Normal Fetal Ultrasounds or other Referrals:  None Maternal Substance Abuse:  No Significant Maternal Medications:  None Significant Maternal Lab Results:  None Other Comments:  None  Review of Systems  Skin: Positive for itching.  All other systems reviewed and are negative.   Dilation: 1.5 Effacement (%): 50 Station: +2 Exam by:: r. denney cnm Blood pressure 125/57, pulse 70, temperature 97.5 F (36.4 C), temperature source Oral, resp. rate 16, height 5\' 3"  (1.6 m), weight 160 lb (72.576 kg), last menstrual period 04/21/2015, unknown if currently breastfeeding. Maternal Exam:  Abdomen: Patient reports no abdominal tenderness. Fetal presentation:  vertex  Introitus: Normal vulva. Normal vagina.  Cervix: Cervix evaluated by digital exam.     Physical Exam  Nursing note and vitals reviewed. Constitutional: She is oriented to person, place, and time. She appears well-developed and well-nourished.  HENT:  Head: Normocephalic and atraumatic.  Eyes: Conjunctivae are normal. Pupils are equal, round, and reactive to light.  Neck: Normal range of motion.  Cardiovascular: Normal rate and regular rhythm.   Respiratory: Effort normal and breath sounds normal.  GI: Soft.  Genitourinary: Vagina normal and uterus normal.  Musculoskeletal: Normal range of motion.  Neurological: She is alert and oriented to person, place, and time.  Skin: Skin is warm and dry.  Psychiatric: She has a normal mood and affect. Her behavior is normal. Judgment and thought content normal.    Prenatal labs: ABO, Rh: --/--/A NEG (03/22 0745) Antibody: NEG (03/22 0745) Rubella: 0.95 (10/07 1055) RPR: Non Reactive (03/22 0745)   Assessment: 39 weeks.  Cholestasis.  LGA.  Admit for IOL, 2 stage. Kevontay Burks A 01/10/2016, 1:45 PM

## 2016-01-11 ENCOUNTER — Encounter (HOSPITAL_COMMUNITY)
Admission: RE | Disposition: A | Discharge status: Home / Self Care | Payer: Self-pay | Source: Ambulatory Visit | Attending: Obstetrics

## 2016-01-11 ENCOUNTER — Inpatient Hospital Stay (HOSPITAL_COMMUNITY): Payer: Medicaid Other | Admitting: Anesthesiology

## 2016-01-11 LAB — GLUCOSE, CAPILLARY
GLUCOSE-CAPILLARY: 84 mg/dL (ref 65–99)
Glucose-Capillary: 132 mg/dL — ABNORMAL HIGH (ref 65–99)
Glucose-Capillary: 60 mg/dL — ABNORMAL LOW (ref 65–99)

## 2016-01-11 SURGERY — Surgical Case
Anesthesia: Epidural | Site: Abdomen

## 2016-01-11 MED ORDER — METHYLERGONOVINE MALEATE 0.2 MG/ML IJ SOLN
INTRAMUSCULAR | Status: DC | PRN
  Administered 2016-01-11: 0.2 mg via INTRAMUSCULAR

## 2016-01-11 MED ORDER — MORPHINE SULFATE (PF) 0.5 MG/ML IJ SOLN
INTRAMUSCULAR | Status: AC
  Filled 2016-01-11: qty 10

## 2016-01-11 MED ORDER — ACETAMINOPHEN 10 MG/ML IV SOLN
1000.0000 mg | Freq: Once | INTRAVENOUS | Status: AC
  Administered 2016-01-11: 1000 mg via INTRAVENOUS
  Filled 2016-01-11: qty 100

## 2016-01-11 MED ORDER — SCOPOLAMINE 1 MG/3DAYS TD PT72
MEDICATED_PATCH | TRANSDERMAL | Status: AC
  Filled 2016-01-11: qty 1

## 2016-01-11 MED ORDER — DIPHENHYDRAMINE HCL 50 MG/ML IJ SOLN
12.5000 mg | INTRAMUSCULAR | Status: AC | PRN
  Administered 2016-01-11 (×3): 12.5 mg via INTRAVENOUS
  Filled 2016-01-11 (×2): qty 1

## 2016-01-11 MED ORDER — CITRIC ACID-SODIUM CITRATE 334-500 MG/5ML PO SOLN
30.0000 mL | ORAL | Status: AC
  Administered 2016-01-11: 30 mL via ORAL

## 2016-01-11 MED ORDER — PHENYLEPHRINE 40 MCG/ML (10ML) SYRINGE FOR IV PUSH (FOR BLOOD PRESSURE SUPPORT)
80.0000 ug | PREFILLED_SYRINGE | INTRAVENOUS | Status: DC | PRN
  Filled 2016-01-11: qty 20

## 2016-01-11 MED ORDER — MORPHINE SULFATE (PF) 0.5 MG/ML IJ SOLN
INTRAMUSCULAR | Status: DC | PRN
  Administered 2016-01-11: 3 mg via EPIDURAL

## 2016-01-11 MED ORDER — FENTANYL 2.5 MCG/ML BUPIVACAINE 1/10 % EPIDURAL INFUSION (WH - ANES)
14.0000 mL/h | INTRAMUSCULAR | Status: DC | PRN
  Administered 2016-01-11 (×3): 14 mL/h via EPIDURAL
  Filled 2016-01-11 (×3): qty 125

## 2016-01-11 MED ORDER — MEPERIDINE HCL 25 MG/ML IJ SOLN
INTRAMUSCULAR | Status: AC
  Filled 2016-01-11: qty 1

## 2016-01-11 MED ORDER — LACTATED RINGERS IV SOLN: 500.0000 mL | Freq: Once | INTRAVENOUS | Status: DC

## 2016-01-11 MED ORDER — PHENYLEPHRINE 40 MCG/ML (10ML) SYRINGE FOR IV PUSH (FOR BLOOD PRESSURE SUPPORT)
PREFILLED_SYRINGE | INTRAVENOUS | Status: AC
  Filled 2016-01-11: qty 10

## 2016-01-11 MED ORDER — GENTAMICIN SULFATE 40 MG/ML IJ SOLN: 5.0000 mg/kg | INTRAMUSCULAR | Status: DC

## 2016-01-11 MED ORDER — NALBUPHINE HCL 10 MG/ML IJ SOLN
10.0000 mg | Freq: Once | INTRAMUSCULAR | Status: AC
  Administered 2016-01-11: 10 mg via INTRAVENOUS
  Filled 2016-01-11: qty 1

## 2016-01-11 MED ORDER — NALBUPHINE HCL 10 MG/ML IJ SOLN: 10.0000 mg | Freq: Once | INTRAMUSCULAR | Status: DC

## 2016-01-11 MED ORDER — SODIUM BICARBONATE 8.4 % IV SOLN
INTRAVENOUS | Status: AC
Start: 2016-01-11 — End: 2016-01-11
  Filled 2016-01-11: qty 50

## 2016-01-11 MED ORDER — ONDANSETRON HCL 4 MG/2ML IJ SOLN
INTRAMUSCULAR | Status: DC | PRN
  Administered 2016-01-11: 4 mg via INTRAVENOUS

## 2016-01-11 MED ORDER — OXYTOCIN 10 UNIT/ML IJ SOLN
40.0000 [IU] | INTRAMUSCULAR | Status: DC | PRN
  Administered 2016-01-11: 40 [IU] via INTRAVENOUS

## 2016-01-11 MED ORDER — GENTAMICIN SULFATE 40 MG/ML IJ SOLN
Freq: Once | INTRAMUSCULAR | Status: AC
  Administered 2016-01-11: 21:00:00 via INTRAVENOUS
  Filled 2016-01-11: qty 9

## 2016-01-11 MED ORDER — LIDOCAINE-EPINEPHRINE (PF) 2 %-1:200000 IJ SOLN
INTRAMUSCULAR | Status: AC
  Filled 2016-01-11: qty 20

## 2016-01-11 MED ORDER — LIDOCAINE HCL (PF) 1 % IJ SOLN
INTRAMUSCULAR | Status: DC | PRN
  Administered 2016-01-11: 6 mL via EPIDURAL
  Administered 2016-01-11: 4 mL

## 2016-01-11 MED ORDER — OXYTOCIN 10 UNIT/ML IJ SOLN
1.0000 m[IU]/min | INTRAVENOUS | Status: DC
  Administered 2016-01-11: 2 m[IU]/min via INTRAVENOUS
  Filled 2016-01-11: qty 10

## 2016-01-11 MED ORDER — OXYTOCIN 10 UNIT/ML IJ SOLN
INTRAMUSCULAR | Status: AC
  Filled 2016-01-11: qty 4

## 2016-01-11 MED ORDER — LACTATED RINGERS IV SOLN: 1.0000 m[IU]/min | INTRAVENOUS | Status: DC

## 2016-01-11 MED ORDER — PHENYLEPHRINE 40 MCG/ML (10ML) SYRINGE FOR IV PUSH (FOR BLOOD PRESSURE SUPPORT): 80.0000 ug | PREFILLED_SYRINGE | INTRAVENOUS | Status: DC | PRN

## 2016-01-11 MED ORDER — LACTATED RINGERS IV SOLN
INTRAVENOUS | Status: DC | PRN
  Administered 2016-01-11 (×2): via INTRAVENOUS

## 2016-01-11 MED ORDER — ONDANSETRON HCL 4 MG/2ML IJ SOLN
INTRAMUSCULAR | Status: AC
  Filled 2016-01-11: qty 2

## 2016-01-11 MED ORDER — MEPERIDINE HCL 25 MG/ML IJ SOLN
INTRAMUSCULAR | Status: DC | PRN
  Administered 2016-01-11: 25 mg via INTRAVENOUS

## 2016-01-11 MED ORDER — CLINDAMYCIN PHOSPHATE 900 MG/50ML IV SOLN: 900.0000 mg | INTRAVENOUS | Status: DC

## 2016-01-11 MED ORDER — EPHEDRINE 5 MG/ML INJ: 10.0000 mg | INTRAVENOUS | Status: DC | PRN

## 2016-01-11 MED ORDER — SCOPOLAMINE 1 MG/3DAYS TD PT72
MEDICATED_PATCH | TRANSDERMAL | Status: DC | PRN
  Administered 2016-01-11: 1 via TRANSDERMAL

## 2016-01-11 MED ORDER — SODIUM BICARBONATE 8.4 % IV SOLN
INTRAVENOUS | Status: DC | PRN
  Administered 2016-01-11 (×3): 5 mL via EPIDURAL

## 2016-01-11 SURGICAL SUPPLY — 36 items
CLAMP CORD UMBIL (MISCELLANEOUS) IMPLANT
CLOTH BEACON ORANGE TIMEOUT ST (SAFETY) ×2 IMPLANT
CONTAINER PREFILL 10% NBF 15ML (MISCELLANEOUS) ×4 IMPLANT
DRSG OPSITE POSTOP 4X10 (GAUZE/BANDAGES/DRESSINGS) ×2 IMPLANT
DURAPREP 26ML APPLICATOR (WOUND CARE) ×4 IMPLANT
ELECT REM PT RETURN 9FT ADLT (ELECTROSURGICAL) ×2
ELECTRODE REM PT RTRN 9FT ADLT (ELECTROSURGICAL) ×1 IMPLANT
EXTRACTOR VACUUM M CUP 4 TUBE (SUCTIONS) IMPLANT
GLOVE BIO SURGEON STRL SZ8 (GLOVE) ×2 IMPLANT
GLOVE BIOGEL PI IND STRL 7.0 (GLOVE) ×1 IMPLANT
GLOVE BIOGEL PI INDICATOR 7.0 (GLOVE) ×1
GOWN STRL REUS W/TWL LRG LVL3 (GOWN DISPOSABLE) ×4 IMPLANT
KIT ABG SYR 3ML LUER SLIP (SYRINGE) IMPLANT
LIQUID BAND (GAUZE/BANDAGES/DRESSINGS) ×2 IMPLANT
NEEDLE HYPO 22GX1.5 SAFETY (NEEDLE) ×2 IMPLANT
NEEDLE HYPO 25X5/8 SAFETYGLIDE (NEEDLE) ×2 IMPLANT
NS IRRIG 1000ML POUR BTL (IV SOLUTION) ×2 IMPLANT
PACK C SECTION WH (CUSTOM PROCEDURE TRAY) ×2 IMPLANT
PAD OB MATERNITY 4.3X12.25 (PERSONAL CARE ITEMS) ×2 IMPLANT
PENCIL SMOKE EVAC W/HOLSTER (ELECTROSURGICAL) ×2 IMPLANT
RTRCTR C-SECT PINK 25CM LRG (MISCELLANEOUS) ×4 IMPLANT
SPONGE LAP 18X18 X RAY DECT (DISPOSABLE) ×2 IMPLANT
STAPLER VISISTAT 35W (STAPLE) ×2 IMPLANT
SUT GUT PLAIN 0 CT-3 TAN 27 (SUTURE) IMPLANT
SUT MNCRL 0 VIOLET CTX 36 (SUTURE) ×4 IMPLANT
SUT MNCRL AB 4-0 PS2 18 (SUTURE) IMPLANT
SUT MON AB 2-0 CT1 27 (SUTURE) ×2 IMPLANT
SUT MON AB 3-0 SH 27 (SUTURE)
SUT MON AB 3-0 SH27 (SUTURE) IMPLANT
SUT MONOCRYL 0 CTX 36 (SUTURE) ×4
SUT PLAIN 2 0 XLH (SUTURE) IMPLANT
SUT VIC AB 0 CTX 36 (SUTURE) ×4
SUT VIC AB 0 CTX36XBRD ANBCTRL (SUTURE) ×2 IMPLANT
SYR CONTROL 10ML LL (SYRINGE) ×2 IMPLANT
TOWEL OR 17X24 6PK STRL BLUE (TOWEL DISPOSABLE) ×2 IMPLANT
TRAY FOLEY CATH SILVER 14FR (SET/KITS/TRAYS/PACK) ×2 IMPLANT

## 2016-01-11 NOTE — Progress Notes (Signed)
CNM at bedside to evaluate; CE same as earlier, Pt informed she will need a C-Section.  Pt prepped for OR at this time.

## 2016-01-11 NOTE — Progress Notes (Signed)
Dr. Clearance CootsHarper called with for an update. Requested to restart pitocin, FHR reassuring at this time. Order received to restart pitocin at 662mu/min and increase by 572mu/min until adequate MVU. Varied may increase Pitocin past 6 mu/min.

## 2016-01-11 NOTE — Op Note (Signed)
Cesarean Section Procedure Note   Thornton DalesSkylar E Sporrer   01/10/2016 - 01/11/2016  Indications: Arrest of dilatation   Pre-operative Diagnosis: cesarean section arrest of dilation .   Post-operative Diagnosis: Same   Surgeon: Yashas Camilli A  Assistants: Surgical technician  Anesthesia: epidural  Procedure Details:  The patient was seen in the Holding Room. The risks, benefits, complications, treatment options, and expected outcomes were discussed with the patient. The patient concurred with the proposed plan, giving informed consent. 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 A Time Out was held and the above information confirmed.  After induction of anesthesia, the patient was draped and prepped in the usual sterile manner. A transverse incision was made and carried down through the subcutaneous tissue to the fascia. The fascial incision was made and extended transversely. The fascia was separated from the underlying rectus tissue superiorly and inferiorly. The peritoneum was identified and entered. The peritoneal incision was extended longitudinally. The utero-vesical peritoneal reflection was incised transversely and the bladder flap was bluntly freed from the lower uterine segment. A low transverse uterine incision was made. Delivered from cephalic presentation was a 4080 gram living newborn female infant(s). APGAR (1 MIN): 9   APGAR (5 MINS): 9   APGAR (10 MINS):    A cord ph was not sent. The umbilical cord was clamped and cut cord. A sample was obtained for evaluation. The placenta was removed Intact and appeared normal.  There was significant uterine atony after delivery of placenta that responded to uterine massage, IV Pitocin and Methergine.  The uterine incision was closed with running locked sutures of 0 Monocryl. A second imbricating layer of the same suture was placed.  Hemostasis was observed. The paracolic gutters were irrigated. The  parieto peritoneum was closed in a running fashion with 2-0 Vicryl.  The fascia was then reapproximated with running sutures of 0 Vicryl.  The skin was closed with staples.  Instrument, sponge, and needle counts were correct prior the abdominal closure and were correct at the conclusion of the case.    Findings: Normal uterus, ovaries and tubes   Estimated Blood Loss: 1800ml  Total IV Fluids: 2700ml   Urine Output: 100CC OF clear urine  Specimens: Placenta to Pathology   Complications: no complications  Disposition: PACU - hemodynamically stable.  Maternal Condition: stable   Baby condition / location:  Couplet care / Skin to Skin    Signed: Surgeon(s): Brock Badharles A Tinia Oravec, MD

## 2016-01-11 NOTE — Progress Notes (Signed)
Dr. Clearance CootsHarper called for and update. Notified him patient doing well. FHR reassuring. Pitocin at 426mu/min, contractions every 2-343min, palp mod-strong. Notified IUPC not tracing well. Zeroed and flushed. Will cont to monitor. Orvilla Cornwallachelle Denney CNM to check patient shortly.

## 2016-01-11 NOTE — Progress Notes (Signed)
Laurie [name] is a 20 y.o. G2P0010 at 7858w1d by LMP admitted for induction of labor due to Gestational diabetes.  Subjective:   Objective: BP 129/68 mmHg  Pulse 74  Temp(Src) 97.7 F (36.5 C) (Oral)  Resp 20  Ht 5\' 3"  (1.6 m)  Wt 160 lb (72.576 kg)  BMI 28.35 kg/m2  SpO2 100%  LMP 04/21/2015 I/O last 3 completed shifts: In: -  Out: 310 [Urine:310]    FHT:  FHR: 140 bpm, variability: moderate,  accelerations:  Present,  decelerations:  Absent UC:   regular, every 3 minutes SVE:   Dilation: 7 Effacement (%): 80 Station: 0 Exam by:: Felisa Bonier. Denney, CNM  Labs: Lab Results  Component Value Date   WBC 8.9 01/10/2016   HGB 9.9* 01/10/2016   HCT 30.4* 01/10/2016   MCV 84.4 01/10/2016   PLT 127* 01/10/2016    Assessment / Plan: 39 weeks.  IOL for GDM / LGA.  No progress for > 5 hours.  Will proceed with C/S for arrest of 1st stage of labor -  Arrest of dilatation.  Labor: No progress Preeclampsia:  n/a Fetal Wellbeing:  Category I Pain Control:  Epidural I/D:  n/a Anticipated MOD:  C/S  Tanna Loeffler A 01/11/2016, 8:57 PM

## 2016-01-11 NOTE — Transfer of Care (Signed)
Immediate Anesthesia Transfer of Care Note  Patient: Laurie [name]  Procedure(s) Performed: Procedure(s): CESAREAN SECTION (N/A)  Patient Location: PACU  Anesthesia Type:Epidural  Level of Consciousness: awake, alert , oriented and patient cooperative  Airway & Oxygen Therapy: Patient Spontanous Breathing  Post-op Assessment: Report given to RN and Post -op Vital signs reviewed and stable  Post vital signs: Reviewed and stable  Last Vitals:  Filed Vitals:   01/11/16 2104 01/11/16 2300  BP: 121/70 115/72  Pulse: 84 81  Temp: 36.8 C   Resp: 20 13    Complications: No apparent anesthesia complications

## 2016-01-11 NOTE — Progress Notes (Signed)
Laurie [name] is a 20 y.o. G2P0010 at 3291w1d by ultrasound admitted for induction of labor due to Gestational diabetes and cholestasis.  Subjective: No complaints  Objective: BP 118/68 mmHg  Pulse 70  Temp(Src) 98.2 F (36.8 C) (Oral)  Resp 17  Ht 5\' 3"  (1.6 m)  Wt 160 lb (72.576 kg)  BMI 28.35 kg/m2  SpO2 100%  LMP 04/21/2015      FHT:  FHR: 140 bpm, variability: moderate,  accelerations:  Present,  decelerations:  Present variable UC:   irregular, every 2-5 minutes SVE:   Dilation: 5 Effacement (%): 60 Station: -2 Exam by:: Felisa Bonier. Denney, CNM  Labs: Lab Results  Component Value Date   WBC 8.9 01/10/2016   HGB 9.9* 01/10/2016   HCT 30.4* 01/10/2016   MCV 84.4 01/10/2016   PLT 127* 01/10/2016    Assessment / Plan: Induction of labor due to gestational diabetes and cholestasis,  progressing well on pitocin  Labor: AROM this AM with IUPC placed Preeclampsia:  no signs or symptoms of toxicity Fetal Wellbeing:  Category II Pain Control:  Epidural I/D:  n/a Anticipated MOD:  attempting NSVD, Light meconium noted at AROM, suspected LGA fetus by US >4200gms  Roe Coombsachelle A Denney, CNM 01/11/2016, 8:52 AM

## 2016-01-11 NOTE — Anesthesia Preprocedure Evaluation (Addendum)
Anesthesia Evaluation  Patient identified by MRN, date of birth, ID band Patient awake    Reviewed: Allergy & Precautions, H&P , NPO status , Patient's Chart, lab work & pertinent test results  Airway Mallampati: II  TM Distance: >3 FB Neck ROM: full    Dental  (+) Teeth Intact   Pulmonary asthma , Current Smoker,    breath sounds clear to auscultation       Cardiovascular negative cardio ROS   Rhythm:regular Rate:Normal     Neuro/Psych PSYCHIATRIC DISORDERS Anxiety Depression negative neurological ROS     GI/Hepatic (+) Hepatitis -, C  Endo/Other  diabetes  Renal/GU negative Renal ROS  negative genitourinary   Musculoskeletal negative musculoskeletal ROS (+)   Abdominal   Peds negative pediatric ROS (+)  Hematology negative hematology ROS (+)   Anesthesia Other Findings       Reproductive/Obstetrics (+) Pregnancy                            Anesthesia Physical Anesthesia Plan  ASA: II  Anesthesia Plan: Epidural   Post-op Pain Management:    Induction:   Airway Management Planned:   Additional Equipment:   Intra-op Plan:   Post-operative Plan:   Informed Consent: I have reviewed the patients History and Physical, chart, labs and discussed the procedure including the risks, benefits and alternatives for the proposed anesthesia with the patient or authorized representative who has indicated his/her understanding and acceptance.   Dental Advisory Given  Plan Discussed with:   Anesthesia Plan Comments: (Labs checked- platelets confirmed with RN in room. Fetal heart tracing, per RN, reported to be stable enough for sitting procedure. Discussed epidural, and patient consents to the procedure:  included risk of possible headache,backache, failed block, allergic reaction, and nerve injury. This patient was asked if she had any questions or concerns before the procedure  started.)        Anesthesia Quick Evaluation

## 2016-01-11 NOTE — Progress Notes (Signed)
Pt states "I'm able to stand, I tried it already..."  Informed Pt it is very unsafe to be getting out of bed without assistance since she has an epidural in place.  Pt agreed to not get up again.  Informed oncoming shift of the event.

## 2016-01-11 NOTE — Anesthesia Postprocedure Evaluation (Signed)
Anesthesia Post Note  Patient: Laurie [name]  Procedure(s) Performed: Procedure(s) (LRB): CESAREAN SECTION (N/A)  Patient location during evaluation: PACU Anesthesia Type: Epidural Level of consciousness: oriented and awake and alert Pain management: pain level controlled Vital Signs Assessment: post-procedure vital signs reviewed and stable Respiratory status: spontaneous breathing, respiratory function stable and patient connected to nasal cannula oxygen Cardiovascular status: blood pressure returned to baseline and stable Postop Assessment: no headache, no backache and epidural receding Anesthetic complications: no    Last Vitals:  Filed Vitals:   01/11/16 2315 01/11/16 2330  BP: 114/53 104/62  Pulse: 71 77  Temp:  36.3 C  Resp: 16 21    Last Pain:  Filed Vitals:   01/11/16 2344  PainSc: 0-No pain                 Shelton SilvasKevin D Hollis

## 2016-01-11 NOTE — Anesthesia Procedure Notes (Signed)
Epidural Patient location during procedure: OB  Preanesthetic Checklist Completed: patient identified, site marked, surgical consent, pre-op evaluation, timeout performed, IV checked, risks and benefits discussed and monitors and equipment checked  Epidural Patient position: sitting Prep: site prepped and draped and DuraPrep Patient monitoring: continuous pulse ox and blood pressure Approach: midline Location: L3-L4 Injection technique: LOR air  Needle:  Needle type: Tuohy  Needle gauge: 17 G Needle length: 9 cm and 9 Needle insertion depth: 5 cm cm Catheter type: closed end flexible Catheter size: 19 Gauge Catheter at skin depth: 10 cm Test dose: negative  Assessment Events: blood not aspirated, injection not painful, no injection resistance, negative IV test and no paresthesia  Additional Notes Dosing of Epidural:  1st dose, through catheter .............................................  Xylocaine 40 mg  2nd dose, through catheter, after waiting 3 minutes.........Xylocaine 60 mg    As each dose occurred, patient was free of IV sx; and patient exhibited no evidence of SA injection.  Patient is more comfortable after epidural dosed. Please see RN's note for documentation of vital signs,and FHR which are stable.  Patient reminded not to try to ambulate with numb legs, and that an RN must be present when she attempts to get up.       

## 2016-01-12 ENCOUNTER — Ambulatory Visit (HOSPITAL_COMMUNITY): Payer: Medicaid Other

## 2016-01-12 ENCOUNTER — Encounter (HOSPITAL_COMMUNITY): Payer: Self-pay

## 2016-01-12 DIAGNOSIS — O24419 Gestational diabetes mellitus in pregnancy, unspecified control: Secondary | ICD-10-CM

## 2016-01-12 DIAGNOSIS — Z98891 History of uterine scar from previous surgery: Secondary | ICD-10-CM

## 2016-01-12 LAB — CBC
HCT: 21.1 % — ABNORMAL LOW (ref 36.0–46.0)
HEMATOCRIT: 24.3 % — AB (ref 36.0–46.0)
Hemoglobin: 8 g/dL — ABNORMAL LOW (ref 12.0–15.0)
Hemoglobin: 7 g/dL — ABNORMAL LOW (ref 12.0–15.0)
MCH: 27.5 pg (ref 26.0–34.0)
MCH: 27.8 pg (ref 26.0–34.0)
MCHC: 32.9 g/dL (ref 30.0–36.0)
MCHC: 33.2 g/dL (ref 30.0–36.0)
MCV: 83.5 fL (ref 78.0–100.0)
MCV: 83.7 fL (ref 78.0–100.0)
PLATELETS: 98 10*3/uL — AB (ref 150–400)
Platelets: 101 10*3/uL — ABNORMAL LOW (ref 150–400)
RBC: 2.91 MIL/uL — ABNORMAL LOW (ref 3.87–5.11)
RBC: 2.52 MIL/uL — ABNORMAL LOW (ref 3.87–5.11)
RDW: 13.7 % (ref 11.5–15.5)
RDW: 13.7 % (ref 11.5–15.5)
WBC: 14.5 10*3/uL — ABNORMAL HIGH (ref 4.0–10.5)
WBC: 14.8 10*3/uL — ABNORMAL HIGH (ref 4.0–10.5)

## 2016-01-12 LAB — GLUCOSE, CAPILLARY: Glucose-Capillary: 82 mg/dL (ref 65–99)

## 2016-01-12 MED ORDER — METHYLERGONOVINE MALEATE 0.2 MG PO TABS
0.2000 mg | ORAL_TABLET | Freq: Four times a day (QID) | ORAL | Status: AC
  Administered 2016-01-12 – 2016-01-13 (×5): 0.2 mg via ORAL
  Filled 2016-01-12 (×6): qty 1

## 2016-01-12 MED ORDER — MEPERIDINE HCL 25 MG/ML IJ SOLN: 6.2500 mg | INTRAMUSCULAR | Status: DC | PRN

## 2016-01-12 MED ORDER — SIMETHICONE 80 MG PO CHEW
80.0000 mg | CHEWABLE_TABLET | Freq: Three times a day (TID) | ORAL | Status: DC
  Administered 2016-01-12 – 2016-01-14 (×7): 80 mg via ORAL
  Filled 2016-01-12 (×7): qty 1

## 2016-01-12 MED ORDER — NALBUPHINE HCL 10 MG/ML IJ SOLN: 5.0000 mg | Freq: Once | INTRAMUSCULAR | Status: DC | PRN

## 2016-01-12 MED ORDER — DIBUCAINE 1 % RE OINT: 1.0000 "application " | TOPICAL_OINTMENT | RECTAL | Status: DC | PRN

## 2016-01-12 MED ORDER — SODIUM CHLORIDE 0.9% FLUSH: 3.0000 mL | INTRAVENOUS | Status: DC | PRN

## 2016-01-12 MED ORDER — NALBUPHINE HCL 10 MG/ML IJ SOLN: 5.0000 mg | INTRAMUSCULAR | Status: DC | PRN

## 2016-01-12 MED ORDER — KETOROLAC TROMETHAMINE 30 MG/ML IJ SOLN: 30.0000 mg | Freq: Four times a day (QID) | INTRAMUSCULAR | Status: AC | PRN

## 2016-01-12 MED ORDER — SIMETHICONE 80 MG PO CHEW: 80.0000 mg | CHEWABLE_TABLET | ORAL | Status: DC | PRN

## 2016-01-12 MED ORDER — IBUPROFEN 600 MG PO TABS
600.0000 mg | ORAL_TABLET | Freq: Four times a day (QID) | ORAL | Status: DC
  Administered 2016-01-12 – 2016-01-13 (×6): 600 mg via ORAL
  Filled 2016-01-12 (×9): qty 1

## 2016-01-12 MED ORDER — METHYLERGONOVINE MALEATE 0.2 MG/ML IJ SOLN: 0.2000 mg | Freq: Four times a day (QID) | INTRAMUSCULAR | Status: AC

## 2016-01-12 MED ORDER — MENTHOL 3 MG MT LOZG: 1.0000 | LOZENGE | OROMUCOSAL | Status: DC | PRN

## 2016-01-12 MED ORDER — OXYCODONE-ACETAMINOPHEN 5-325 MG PO TABS
1.0000 | ORAL_TABLET | ORAL | Status: DC | PRN
  Administered 2016-01-12: 1 via ORAL
  Filled 2016-01-12: qty 1

## 2016-01-12 MED ORDER — HYDROMORPHONE HCL 1 MG/ML IJ SOLN: 0.2500 mg | INTRAMUSCULAR | Status: DC | PRN

## 2016-01-12 MED ORDER — SCOPOLAMINE 1 MG/3DAYS TD PT72: 1.0000 | MEDICATED_PATCH | Freq: Once | TRANSDERMAL | Status: DC

## 2016-01-12 MED ORDER — OXYTOCIN 10 UNIT/ML IJ SOLN
2.5000 [IU]/h | INTRAVENOUS | Status: AC
  Administered 2016-01-12: 2.5 [IU]/h via INTRAVENOUS
  Filled 2016-01-12: qty 10

## 2016-01-12 MED ORDER — LANOLIN HYDROUS EX OINT: 1.0000 "application " | TOPICAL_OINTMENT | CUTANEOUS | Status: DC | PRN

## 2016-01-12 MED ORDER — DIPHENHYDRAMINE HCL 50 MG/ML IJ SOLN
12.5000 mg | INTRAMUSCULAR | Status: DC | PRN
Start: 2016-01-12 — End: 2016-01-13

## 2016-01-12 MED ORDER — TETANUS-DIPHTH-ACELL PERTUSSIS 5-2.5-18.5 LF-MCG/0.5 IM SUSP
0.5000 mL | Freq: Once | INTRAMUSCULAR | Status: DC
  Filled 2016-01-12: qty 0.5

## 2016-01-12 MED ORDER — PRENATAL MULTIVITAMIN CH
1.0000 | ORAL_TABLET | Freq: Every day | ORAL | Status: DC
  Administered 2016-01-12: 1 via ORAL
  Filled 2016-01-12 (×2): qty 1

## 2016-01-12 MED ORDER — ACETAMINOPHEN 325 MG PO TABS
650.0000 mg | ORAL_TABLET | ORAL | Status: DC | PRN
  Administered 2016-01-12: 650 mg via ORAL
  Filled 2016-01-12: qty 2

## 2016-01-12 MED ORDER — ZOLPIDEM TARTRATE 5 MG PO TABS: 5.0000 mg | ORAL_TABLET | Freq: Every evening | ORAL | Status: DC | PRN

## 2016-01-12 MED ORDER — WITCH HAZEL-GLYCERIN EX PADS: 1.0000 "application " | MEDICATED_PAD | CUTANEOUS | Status: DC | PRN

## 2016-01-12 MED ORDER — DEXTROSE 5 % IV SOLN
1.0000 ug/kg/h | INTRAVENOUS | Status: DC | PRN
  Filled 2016-01-12: qty 2

## 2016-01-12 MED ORDER — DIPHENHYDRAMINE HCL 25 MG PO CAPS: 25.0000 mg | ORAL_CAPSULE | Freq: Four times a day (QID) | ORAL | Status: DC | PRN

## 2016-01-12 MED ORDER — NALOXONE HCL 0.4 MG/ML IJ SOLN: 0.4000 mg | INTRAMUSCULAR | Status: DC | PRN

## 2016-01-12 MED ORDER — POLYSACCHARIDE IRON COMPLEX 150 MG PO CAPS
150.0000 mg | ORAL_CAPSULE | Freq: Every day | ORAL | Status: DC
  Administered 2016-01-12 – 2016-01-14 (×3): 150 mg via ORAL
  Filled 2016-01-12 (×3): qty 1

## 2016-01-12 MED ORDER — LACTATED RINGERS IV SOLN: INTRAVENOUS | Status: DC

## 2016-01-12 MED ORDER — OXYCODONE-ACETAMINOPHEN 5-325 MG PO TABS
2.0000 | ORAL_TABLET | ORAL | Status: DC | PRN
  Administered 2016-01-12 – 2016-01-14 (×8): 2 via ORAL
  Filled 2016-01-12 (×8): qty 2

## 2016-01-12 MED ORDER — DIPHENHYDRAMINE HCL 25 MG PO CAPS
25.0000 mg | ORAL_CAPSULE | ORAL | Status: DC | PRN
  Filled 2016-01-12: qty 1

## 2016-01-12 MED ORDER — SENNOSIDES-DOCUSATE SODIUM 8.6-50 MG PO TABS
2.0000 | ORAL_TABLET | ORAL | Status: DC
  Administered 2016-01-13 – 2016-01-14 (×2): 2 via ORAL
  Filled 2016-01-12 (×2): qty 2

## 2016-01-12 MED ORDER — LACTATED RINGERS IV SOLN
INTRAVENOUS | Status: DC
  Administered 2016-01-12: 10:00:00 via INTRAVENOUS

## 2016-01-12 MED ORDER — SIMETHICONE 80 MG PO CHEW
80.0000 mg | CHEWABLE_TABLET | ORAL | Status: DC
  Administered 2016-01-13 – 2016-01-14 (×2): 80 mg via ORAL
  Filled 2016-01-12 (×2): qty 1

## 2016-01-12 MED ORDER — ONDANSETRON HCL 4 MG/2ML IJ SOLN: 4.0000 mg | Freq: Three times a day (TID) | INTRAMUSCULAR | Status: DC | PRN

## 2016-01-12 MED ORDER — PROMETHAZINE HCL 25 MG/ML IJ SOLN: 6.2500 mg | INTRAMUSCULAR | Status: DC | PRN

## 2016-01-12 NOTE — Progress Notes (Signed)
Subjective: Postpartum Day #1: Cesarean Delivery Patient reports incisional pain and tolerating PO.    Objective: Vital signs in last 24 hours: Temp:  [97.4 F (36.3 C)-98.6 F (37 C)] 98.6 F (37 C) (03/24 0345) Pulse Rate:  [63-98] 94 (03/24 0608) Resp:  [13-22] 20 (03/24 0345) BP: (100-132)/(45-105) 100/52 mmHg (03/24 0608) SpO2:  [91 %-99 %] 98 % (03/24 0345)  Physical Exam:  General: alert, cooperative and no distress Lochia: appropriate Uterine Fundus: firm Incision: no significant drainage DVT Evaluation: No evidence of DVT seen on physical exam. No cords or calf tenderness. Calf/Ankle edema is present.   Recent Labs  01/12/16 0047 01/12/16 0522  HGB 8.0* 7.0*  HCT 24.3* 21.1*    Assessment/Plan: Status post Cesarean section. Doing well postoperatively.  Continue current care.  Roe Coombsachelle A Maleik Vanderzee, CNM 01/12/2016, 8:30 AM

## 2016-01-12 NOTE — Addendum Note (Signed)
Addendum  created 01/12/16 0850 by Elgie CongoNataliya H Jamell Laymon, CRNA   Modules edited: Clinical Notes   Clinical Notes:  File: [id number]

## 2016-01-12 NOTE — Progress Notes (Signed)
Patient came to office for diabetic teaching. Diabetic supplies sent to pharmacy.

## 2016-01-12 NOTE — Progress Notes (Signed)
Received report from Alfonzo FellerHeather Gaither at 1900 regarding Ms Selfs low diastolics. Pt. Ambulating frequently without dizziness or feeling lt. Headed. Dr. Gaynell FaceMarshall notified of pts condition by Alfonzo FellerHeather Gaither.No new orders received.

## 2016-01-12 NOTE — Clinical Social Work Maternal (Signed)
CLINICAL SOCIAL WORK MATERNAL/CHILD NOTE  Patient Details  Name: Laurie [name] MRN: [id number] Date of Birth: [birth year]  Date:  01/12/2016  Clinical Social Worker Initiating Note:  Lucita Ferrara MSW, LCSW Date/ Time Initiated:  01/12/16/1215     Child's Name:  [name]    Legal Guardian:  Mother   Need for Interpreter:  None   Date of Referral:  01/10/16     Reason for Referral:  Current Substance Use/Substance Use During Pregnancy , History of depression and anxiety  Referral Source:  [facility]   Address:  [place], [place] [address]  Phone number:  [phone]   Household Members:  Parents, Siblings (2 younger brothers)   Natural Supports (not living in the home):  Immediate Family   Professional Supports: None   Employment: Unemployed   Type of Work:   N/A  Education:  9 to 11 years   Museum/gallery curator Resources:  Medicaid   Other Resources:  [facility]   Cultural/Religious Considerations Which May Impact Care:  None reported  Strengths:  Ability to meet basic needs , Home prepared for child    Risk Factors/Current Problems:   1. Mental Health Concerns-- MOB presents with a history of depression and anxiety. MOB denied history of a formal diagnosis and treatment, but shared that she noted onset of symptoms at age 20.  2. Substance Use-- MOB reported history of unprescribed use of "pills", Xanax, and THC until she learned that she was pregnant at [redacted] weeks. Chart review documents that MOB presents with a history of heroin use until June 2016.  MOB presents with +UDS for [facility] in October (3 different drug screens), but with repeat drug screens in December and February are negative.  Infant's toxicology screens are pending.   Cognitive State:  Able to Concentrate , Alert , Goal Oriented , Linear Thinking    Mood/Affect:  Calm , Comfortable    CSW Assessment:  CSW received request for consult due to MOB presenting with a history of  polysubstance use and a history of depression and anxiety.  MOB reported that she was tired and exhausted, but was agreeable to CSW visit. MOB was observed to be holding the infant skin to skin, and smiling as she looked at and interacted with the infant. Affect congruent as her stated mood.   MOB reported feeling tired and exhausted. She reflected upon her induction and long labor.  Per MOB, she had an emergency C-Section, and expressed feeling scared due to limited time to react to the news. MOB discussed how she now realizes that she and the infant are "okay", and was minimizing her emotional reaction to the news.  MOB shared that she has enjoyed being a mother thus far, and is looking forward to going home.  MOB stated that she lives with her mother, father, and two younger brothers. CSW directly asked how her family feels about the infant. She stated that they are "happy", but stated that her youngest brother is "jealous".  MOB shared that her relationship with the FOB is "bipolar" since it can be "up and down".  She stated that she is not in a significant relationship with him, and reported that she feels conflicted about his presence.  MOB shared that she does not want the infant to be raised in a "broken home", but also shared that she does not want the infant exposed to the relational stress. MOB discussed how she often feels that it is best for him  to not be involved. With further exploration, MOB was able to identify her desire to have positive role models and to know that she is loved and cared for. MOB recognized that this does not need to be achieved with the biological father, and that there are other opportunities for other family members to provide this positive relationship to the infant.    CSW continued to explore with MOB her vision for herself as a mother and the type of relationship she wants with the infant. She discussed goals of wanting her daughter to know that she is always there for  her and that she talk to her about anything. MOB continued to discuss her desire to create a sense of safety and security in her daughter's life.  MOB smiled as she reflected upon her realization that she has already begun to be the mother she wants to be. MOB denied belief that there are any changes or needs that need to be met in order to help her to continue to be her ideal [place]. Per MOB, the home is prepared for the infant, and all basic needs are met.  MOB stated that her parents helped her to obtain basic baby supplies.  MOB shared that she is currently not working, but intends to begin to seek out employment opportunities once she recovers from her C-section.  MOB stated that she is currently on probation (with 9 months left), but denied current status as a presenting problem or stressor.   MOB was a vague and limited historian regarding her depression and anxiety. She confirmed history, but denied formal diagnosis and prior treatment history. She stated that she noted onset of symptoms at age 20, but denied belief that her anxiety and depression are a current problem. MOB was receptive to education on the baby blues and perinatal mood disorders, and agreed to contact her medical provider if she notes onset of symptoms.   MOB continued to be vague and a limited historian in regards to her substance use.  Per MOB, "I used to do drugs", but stated that she stopped all drug use once she learned that she was pregnant. MOB reported that she learned that she was pregnant at [redacted] weeks.  MOB eventually disclosed history of unprescribed Xanax and "pill" use.  MOB was never able to verify exact use, or events that led to use, but she again discussed no use once she learned of the pregnancy.  MOB shared that she immediately became motivated by the pregnancy, and discussed how she did not want to cause harm to the infant.  MOB denied need for treatment, and shared that only focused on the need to be in a state of  recovery for the infant.  MOB shared that other people have shared their beliefs that she will re-start use since she is no longer pregnant, but she expressed confidence that she will not.  Per MOB, she intends to breastfeed, and she does not want the infant to be exposed to substances.  MOB continued to discuss how it will be difficult to the mother she wants to be if she were to re-start use, and expressed fear that the infant would be "taken" from her if she were to re-start use.  MOB denied need for substance use treatment or support as she transitions postpartum.  CSW informed MOB of the hospital drug screen policy, and MOB denied questions, concerns, or needs.  CSW continued to explore with MOB her accomplishments and what she is most  proud of.  MOB stated that she is most proud of her daughter, and again began to discuss how her daughter has been pivotal for her to change and to begin the next phase of her life.  MOB denied presence of areas of need, and was unable to identify any unmet needs.  MOB expressed appreciation for the visit, acknowledged ongoing availability of CSW, and agreed to contact CSW if additional needs arise.  CSW Plan/Description:   1. Patient/Family Education -- perinatal mood and anxiety disorders, hospital drug screen policy 2. CSW to monitor infant's toxicology screens, and will refer to CPS if positive.  3. No Further Intervention Required/No Barriers to Discharge    Sharyl Nimrod 2016-07-05, 1:03 PM

## 2016-01-12 NOTE — Anesthesia Postprocedure Evaluation (Signed)
Anesthesia Post Note  Patient: Brynnlee E Garcia  Procedure(s) Performed: Procedure(s) (LRB): CESAREAN SECTION (N/A)  Patient location during evaluation: Mother Baby Anesthesia Type: Epidural Level of consciousness: oriented, awake and alert and awake Pain management: pain level controlled Vital Signs Assessment: post-procedure vital signs reviewed and stable Respiratory status: spontaneous breathing Cardiovascular status: stable Postop Assessment: patient able to bend at knees, no signs of nausea or vomiting and adequate PO intake Anesthetic complications: no    Last Vitals:  Filed Vitals:   01/12/16 0605 01/12/16 0608  BP: 100/59 100/52  Pulse: 96 94  Temp:    Resp:      Last Pain:  Filed Vitals:   01/12/16 0756  PainSc: 7                  Talayia Hjort Hristova

## 2016-01-12 NOTE — Progress Notes (Signed)
UR chart review completed.  

## 2016-01-12 NOTE — Progress Notes (Signed)
Subjective: Postpartum Day 1: Cesarean Delivery Patient reports tolerating PO and + flatus.    Objective: Vital signs in last 24 hours: Temp:  [97.4 F (36.3 C)-98.6 F (37 C)] 98.6 F (37 C) (03/24 0345) Pulse Rate:  [63-105] 67 (03/24 0345) Resp:  [13-22] 20 (03/24 0345) BP: (100-132)/(45-105) 105/49 mmHg (03/24 0345) SpO2:  [91 %-99 %] 98 % (03/24 0345)  Physical Exam:  General: alert and no distress Lochia: appropriate Uterine Fundus: firm Incision: healing well DVT Evaluation: No evidence of DVT seen on physical exam.   Recent Labs  01/10/16 0745 01/12/16 0047  HGB 9.9* 8.0*  HCT 30.4* 24.3*    Assessment/Plan: Status post Cesarean section. Doing well postoperatively.  Anemia from uterine atony and acute blood loss.  Clinically stable.  Iron Rx.  Continue current care.  Sayana Salley A 01/12/2016, 5:55 AM

## 2016-01-12 NOTE — Lactation Note (Signed)
This note was copied from a baby's chart. Lactation Consultation Note RN assisting mom with latch as I went into room. Baby holding the breast in her mouth without sucking. Mom easily able to hand express Colostrum. Tried football hold and baby took a few sucks on and off the breast. Mom dozing off while nursing. Spoon fed 5 ml to baby by spoon. Wrapped and placed in bassinet. BF brochure left with mom but she is too sleepy at this time to review with her.   Patient Name: Laurie [name] Today's Date: 01/12/2016 Reason for consult: Initial assessment   Maternal Data Formula Feeding for Exclusion: No Has patient been taught Hand Expression?: Yes Does the patient have breastfeeding experience prior to this delivery?: No  Feeding Feeding Type: Breast Fed Length of feed: 5 min  LATCH Score/Interventions Latch: Repeated attempts needed to sustain latch, nipple held in mouth throughout feeding, stimulation needed to elicit sucking reflex.  Audible Swallowing: None  Type of Nipple: Everted at rest and after stimulation  Comfort (Breast/Nipple): Soft / non-tender     Hold (Positioning): Assistance needed to correctly position infant at breast and maintain latch. Intervention(s): Breastfeeding basics reviewed;Support Pillows  LATCH Score: 6  Lactation Tools Discussed/Used     Consult Status Consult Status: Follow-up Date: 01/13/16 Follow-up type: In-patient    Pamelia HoitWeeks, Alinah Sheard D 01/12/2016, 1:55 PM

## 2016-01-13 ENCOUNTER — Encounter (HOSPITAL_COMMUNITY): Payer: Self-pay | Admitting: Obstetrics

## 2016-01-13 NOTE — Lactation Note (Signed)
This note was copied from a baby's chart. Lactation Consultation Note  Mother states she has "no milk".  She states she has only viewed drops when hand expressing and baby "wants to eat all the time." Provided education regarding stomach size, cluster feeding, supply and demand and reviewed volume guidelines. Grandfather states he recently gave the baby "the whole bottle" 50 ml and was told by RN to let her have the whole bottle if she wants. Offered assistance with latching and mother states she wants to continue to try breastfeeding but not now. Unsure of mother's motivation to breastfeed.   Patient Name: Laurie [name] Today's Date: 01/13/2016 Reason for consult: Follow-up assessment   Maternal Data    Feeding Feeding Type: Formula Nipple Type: Slow - flow  LATCH Score/Interventions                      Lactation Tools Discussed/Used     Consult Status Consult Status: Follow-up Date: 01/14/16 Follow-up type: In-patient    Dahlia ByesBerkelhammer, Rafael Salway [facility] 01/13/2016, 7:40 PM

## 2016-01-13 NOTE — Progress Notes (Signed)
Patient ID: Laurie [name], female   DOB: 01-Nov-1995, 20 y.o.   MRN: [id number] Postop day 2 Blood pressure 91/38 pulse 67 respiration 18 Fundus firm No calm legs negative no complaints

## 2016-01-14 NOTE — Discharge Summary (Signed)
Obstetric Discharge Summary Reason for Admission: onset of labor Prenatal Procedures: none Intrapartum Procedures: cesarean: low cervical, transverse Postpartum Procedures: none Complications-Operative and Postpartum: none HEMOGLOBIN  Date Value Ref Range Status  01/12/2016 7.0* 12.0 - 15.0 g/dL Final   HCT  Date Value Ref Range Status  01/12/2016 21.1* 36.0 - 46.0 % Final   HEMATOCRIT  Date Value Ref Range Status  01/09/2016 31.7* 34.0 - 46.6 % Final    Physical Exam:  General: alert Lochia: appropriate Uterine Fundus: firm Incision: healing well DVT Evaluation: No evidence of DVT seen on physical exam.  Discharge Diagnoses: Term Pregnancy-delivered  Discharge Information: Date: 01/14/2016 Activity: pelvic rest Diet: routine Medications: Percocet Condition: stable Instructions: refer to practice specific booklet Discharge to: home   Newborn Data: Live born female  Birth Weight: 8 lb 15.9 oz (4080 g) APGAR: 9, 9  Home with mother.  Laurie [name] 01/14/2016, 7:51 AM

## 2016-01-14 NOTE — Progress Notes (Signed)
Pt woke briefly, requested percocet (refused motrin), then fell back to sleep before taking-could not arouse (deep sleep)  Medication held (not given)

## 2016-01-14 NOTE — Progress Notes (Signed)
Patient ID: Laurie BoozerSkylar E [name], female   DOB: 05-Dec-1995, 20 y.o.   MRN: [id number] Postop day 3 Blood pressure 106/49 pulse 78 respiration 20 Abdomen soft Legs negative home today

## 2016-01-14 NOTE — Discharge Instructions (Signed)
Discharge instructions   You can wash your hair  Shower  Eat what you want  Drink what you want  See me in 6 weeks  Your ankles are going to swell more in the next 2 weeks than when pregnant  No sex for 6 weeks   Laurie Brier A, MD 01/14/2016

## 2016-01-16 ENCOUNTER — Encounter: Payer: Medicaid Other | Admitting: Certified Nurse Midwife

## 2016-03-16 ENCOUNTER — Encounter (HOSPITAL_COMMUNITY): Payer: Self-pay | Admitting: *Deleted

## 2016-03-16 ENCOUNTER — Emergency Department (HOSPITAL_COMMUNITY)
Admission: EM | Admit: 2016-03-16 | Discharge: 2016-03-17 | Disposition: A | Discharge status: Home / Self Care | Payer: Medicaid Other | Attending: Emergency Medicine | Admitting: Emergency Medicine

## 2016-03-16 DIAGNOSIS — F17210 Nicotine dependence, cigarettes, uncomplicated: Secondary | ICD-10-CM | POA: Diagnosis not present

## 2016-03-16 DIAGNOSIS — O99281 Endocrine, nutritional and metabolic diseases complicating pregnancy, first trimester: Secondary | ICD-10-CM | POA: Insufficient documentation

## 2016-03-16 DIAGNOSIS — O99331 Smoking (tobacco) complicating pregnancy, first trimester: Secondary | ICD-10-CM | POA: Insufficient documentation

## 2016-03-16 DIAGNOSIS — F329 Major depressive disorder, single episode, unspecified: Secondary | ICD-10-CM | POA: Insufficient documentation

## 2016-03-16 DIAGNOSIS — J45909 Unspecified asthma, uncomplicated: Secondary | ICD-10-CM | POA: Insufficient documentation

## 2016-03-16 DIAGNOSIS — Z3A09 9 weeks gestation of pregnancy: Secondary | ICD-10-CM | POA: Diagnosis not present

## 2016-03-16 DIAGNOSIS — E860 Dehydration: Secondary | ICD-10-CM

## 2016-03-16 DIAGNOSIS — O26811 Pregnancy related exhaustion and fatigue, first trimester: Secondary | ICD-10-CM | POA: Diagnosis present

## 2016-03-16 LAB — COMPREHENSIVE METABOLIC PANEL
ALT: 157 U/L — AB (ref 14–54)
AST: 118 U/L — AB (ref 15–41)
Albumin: 4.1 g/dL (ref 3.5–5.0)
Alkaline Phosphatase: 108 U/L (ref 38–126)
Anion gap: 8 (ref 5–15)
BILIRUBIN TOTAL: 0.7 mg/dL (ref 0.3–1.2)
BUN: 23 mg/dL — AB (ref 6–20)
CO2: 24 mmol/L (ref 22–32)
CREATININE: 0.51 mg/dL (ref 0.44–1.00)
Calcium: 9.2 mg/dL (ref 8.9–10.3)
Chloride: 106 mmol/L (ref 101–111)
Glucose, Bld: 95 mg/dL (ref 65–99)
Potassium: 3.7 mmol/L (ref 3.5–5.1)
Sodium: 138 mmol/L (ref 135–145)
TOTAL PROTEIN: 8.2 g/dL — AB (ref 6.5–8.1)

## 2016-03-16 LAB — CBC WITH DIFFERENTIAL/PLATELET
BASOS ABS: 0 10*3/uL (ref 0.0–0.1)
BASOS PCT: 1 %
EOS ABS: 0 10*3/uL (ref 0.0–0.7)
EOS PCT: 1 %
HCT: 30.6 % — ABNORMAL LOW (ref 36.0–46.0)
HEMOGLOBIN: 9 g/dL — AB (ref 12.0–15.0)
Lymphocytes Relative: 41 %
Lymphs Abs: 2.7 10*3/uL (ref 0.7–4.0)
MCH: 20.5 pg — AB (ref 26.0–34.0)
MCHC: 29.4 g/dL — ABNORMAL LOW (ref 30.0–36.0)
MCV: 69.5 fL — ABNORMAL LOW (ref 78.0–100.0)
Monocytes Absolute: 0.7 10*3/uL (ref 0.1–1.0)
Monocytes Relative: 11 %
NEUTROS PCT: 47 %
Neutro Abs: 3 10*3/uL (ref 1.7–7.7)
PLATELETS: 221 10*3/uL (ref 150–400)
RBC: 4.4 MIL/uL (ref 3.87–5.11)
RDW: 17.8 % — ABNORMAL HIGH (ref 11.5–15.5)
WBC: 6.5 10*3/uL (ref 4.0–10.5)

## 2016-03-16 LAB — CBG MONITORING, ED: Glucose-Capillary: 86 mg/dL (ref 65–99)

## 2016-03-16 LAB — PREGNANCY, URINE: PREG TEST UR: NEGATIVE

## 2016-03-16 LAB — CK: CK TOTAL: 51 U/L (ref 38–234)

## 2016-03-16 MED ORDER — SODIUM CHLORIDE 0.9 % IV BOLUS (SEPSIS)
1000.0000 mL | Freq: Once | INTRAVENOUS | Status: AC
  Administered 2016-03-16: 1000 mL via INTRAVENOUS

## 2016-03-16 MED ORDER — ACETAMINOPHEN 325 MG PO TABS
650.0000 mg | ORAL_TABLET | Freq: Once | ORAL | Status: AC
  Administered 2016-03-16: 650 mg via ORAL
  Filled 2016-03-16: qty 2

## 2016-03-16 NOTE — ED Notes (Signed)
Pt arrived by EMS, called out for diabetic problems. Was reported pt had walked about 10 miles this afternoon. Pt EMS pt was diaphoretic on scene, CBG 96.

## 2016-03-16 NOTE — ED Notes (Signed)
Pt states she is still unable to give a urine sample. 

## 2016-03-16 NOTE — ED Notes (Signed)
Pt provided a pack of crackers.

## 2016-03-16 NOTE — ED Notes (Signed)
Pt called to desk asking for something to eat & drink. Pt informed she had to wait until seen by EDP.

## 2016-03-17 ENCOUNTER — Encounter (HOSPITAL_COMMUNITY): Payer: Self-pay | Admitting: *Deleted

## 2016-03-17 ENCOUNTER — Emergency Department (HOSPITAL_COMMUNITY)
Admission: EM | Admit: 2016-03-17 | Discharge: 2016-03-18 | Disposition: A | Discharge status: Home / Self Care | Payer: Medicaid Other | Attending: Emergency Medicine | Admitting: Emergency Medicine

## 2016-03-17 DIAGNOSIS — D62 Acute posthemorrhagic anemia: Secondary | ICD-10-CM

## 2016-03-17 DIAGNOSIS — D500 Iron deficiency anemia secondary to blood loss (chronic): Secondary | ICD-10-CM | POA: Insufficient documentation

## 2016-03-17 DIAGNOSIS — F17210 Nicotine dependence, cigarettes, uncomplicated: Secondary | ICD-10-CM | POA: Insufficient documentation

## 2016-03-17 DIAGNOSIS — F329 Major depressive disorder, single episode, unspecified: Secondary | ICD-10-CM | POA: Diagnosis not present

## 2016-03-17 DIAGNOSIS — N939 Abnormal uterine and vaginal bleeding, unspecified: Secondary | ICD-10-CM

## 2016-03-17 DIAGNOSIS — J45909 Unspecified asthma, uncomplicated: Secondary | ICD-10-CM | POA: Diagnosis not present

## 2016-03-17 LAB — RAPID URINE DRUG SCREEN, HOSP PERFORMED
Amphetamines: NOT DETECTED
Barbiturates: NOT DETECTED
Benzodiazepines: NOT DETECTED
COCAINE: NOT DETECTED
OPIATES: NOT DETECTED
Tetrahydrocannabinol: POSITIVE — AB

## 2016-03-17 LAB — ETHANOL: Alcohol, Ethyl (B): <5 mg/dL (ref <5)

## 2016-03-17 NOTE — ED Notes (Signed)
Pt informed we need to collect additional urine for testing.

## 2016-03-17 NOTE — Discharge Instructions (Signed)

## 2016-03-17 NOTE — ED Notes (Signed)
Pt alert & oriented x4, stable gait. Patient given discharge instructions, paperwork & prescription(s). Patient  instructed to stop at the registration desk to finish any additional paperwork. Patient verbalized understanding. Pt left department w/ no further questions. 

## 2016-03-17 NOTE — ED Provider Notes (Signed)
CSN: [id number]     Arrival date & time 03/16/16  2021 History   First MD Initiated Contact with Patient 03/16/16 2136     Chief Complaint  Patient presents with  . Heat Exposure  . Gestational Diabetes     (Consider location/radiation/quality/duration/timing/severity/associated sxs/prior Treatment) HPI   Laurie [name] is a 20 y.o. female who states she is [redacted] weeks pregnant, presents to the Emergency Department complaining of generalized weakness, fatigue and heat exposure.  She states that she and her significant other have been walking several miles earlier on the day of ER arrival.  Patient's boyfriend states that he noticed that she became quiet and had stopped walking and sat down beside the road.  He states that she was not talking to him and he carried her home and called EMS.  Patient states that she had an appt recently with her OB/GYN and was told that she was "possibly" pregnant by urine test and was scheduled for [place], but she was unable to keep her appt.  She denies abd pain, pelvic pain, vaginal bleeding, vomiting, chest pain, shortness of breath, dysuria.   Past Medical History  Diagnosis Date  . Chlamydia   . Pregnant   . History of heroin abuse   . Asthma     "grew out"  . Hepatitis C June 2016  . Infection     UTI  . Anxiety   . Depression     doing really good  . Gestational diabetes    Past Surgical History  Procedure Laterality Date  . No past surgeries    . Cesarean section N/A 01/11/2016    Procedure: CESAREAN SECTION;  Surgeon: Brock Bad, MD;  Location: [facility];  Service: Obstetrics;  Laterality: N/A;   Family History  Problem Relation Age of Onset  . Diabetes Mother   . Pulmonary embolism Mother   . Stroke Mother   . Epilepsy Brother   . Cancer Paternal Grandmother     liver   Social History  Substance Use Topics  . Smoking status: Current Some Day Smoker -- 0.50 packs/day    Types: Cigarettes  . Smokeless tobacco: Never Used     Comment:  quit with + preg test  . Alcohol Use: No     Comment: Occas use.; none since mid Sept   OB History    Gravida Para Term Preterm AB TAB SAB Ectopic Multiple Living   0 1 0 1 0 0 1     Review of Systems  Constitutional: Positive for fatigue. Negative for fever, chills, activity change and appetite change.  HENT: Negative for congestion, sore throat and trouble swallowing.   Respiratory: Negative for chest tightness and shortness of breath.   Gastrointestinal: Negative for nausea, vomiting and abdominal pain.  Genitourinary: Positive for decreased urine volume. Negative for difficulty urinating.  Musculoskeletal: Negative for arthralgias.  Skin: Negative for color change and rash.  Neurological: Positive for weakness (generalized weakness). Negative for dizziness.  Hematological: Negative for adenopathy.  Psychiatric/Behavioral: The patient is not nervous/anxious.   All other systems reviewed and are negative.     Allergies  Penicillins  Home Medications   Prior to Admission medications   Not on File   BP 114/59 mmHg  Pulse 85  Resp 18  SpO2 97% Physical Exam  Constitutional: She is oriented to person, place, and time. She appears well-developed and well-nourished. No distress.  HENT:  Head: Normocephalic and atraumatic.  Mouth/Throat: Oropharynx  is clear and moist.  Eyes: Conjunctivae are normal. Pupils are equal, round, and reactive to light.  Neck: Normal range of motion. Neck supple.  Cardiovascular: Normal rate, regular rhythm, normal heart sounds and intact distal pulses.   Pulmonary/Chest: Effort normal and breath sounds normal. No respiratory distress. She exhibits no tenderness.  Abdominal: Soft. She exhibits no distension. There is no tenderness.  Musculoskeletal: Normal range of motion. She exhibits no tenderness.  Lymphadenopathy:    She has no cervical adenopathy.  Neurological: She is alert and oriented to person, place, and time. She exhibits normal  muscle tone. Coordination normal.  Skin: Skin is warm and dry.  Psychiatric: She has a normal mood and affect.  Nursing note and vitals reviewed.   ED Course  Procedures (including critical care time) Labs Review Labs Reviewed  COMPREHENSIVE METABOLIC PANEL - Abnormal; Notable for the following:    BUN 23 (*)    Total Protein 8.2 (*)    AST 118 (*)    ALT 157 (*)    All other components within normal limits  CBC WITH DIFFERENTIAL/PLATELET - Abnormal; Notable for the following:    Hemoglobin 9.0 (*)    HCT 30.6 (*)    MCV 69.5 (*)    MCH 20.5 (*)    MCHC 29.4 (*)    RDW 17.8 (*)    All other components within normal limits  URINE RAPID DRUG SCREEN, HOSP PERFORMED - Abnormal; Notable for the following:    Tetrahydrocannabinol POSITIVE (*)    All other components within normal limits  URINE CULTURE  CK  PREGNANCY, URINE  ETHANOL  URINALYSIS, ROUTINE W REFLEX MICROSCOPIC (NOT AT [facility])  CBG MONITORING, ED    Imaging Review No results found. I have personally reviewed and evaluated these images and lab results as part of my medical decision-making.   EKG Interpretation None      MDM   Final diagnoses:  Dehydration   Patient resting comfortably. Well appearing.  Likely dehydration.  Non-toxic appearing, vitals stable, no orthostatic hypotension.  Will observe, give IVF's, check labs.  Urine is neg.  abd soft, NT.  Labs reassuring.  Pt has ate food and drank fluids without difficulty, feeling better and appears stable for d/c   Pauline Ausammy Almer Bushey, PA-C 03/19/16 1725  Vanetta MuldersScott Zackowski, MD 03/20/16 1655

## 2016-03-17 NOTE — ED Notes (Addendum)
Pt c/o vaginal bleeding and abd pain that started today, reports that she is [redacted] weeks pregnant, was seen in er yesterday, had negative urine pregnancy test resulted. Pt advised that it was ok to discuss medical information with female friend in room

## 2016-03-18 LAB — ABO/RH: ABO/RH(D): A NEG

## 2016-03-18 LAB — BASIC METABOLIC PANEL
Anion gap: 5 (ref 5–15)
BUN: 21 mg/dL — AB (ref 6–20)
CALCIUM: 8.6 mg/dL — AB (ref 8.9–10.3)
CHLORIDE: 110 mmol/L (ref 101–111)
CO2: 23 mmol/L (ref 22–32)
CREATININE: 0.77 mg/dL (ref 0.44–1.00)
GFR calc non Af Amer: >60 mL/min (ref >60)
GLUCOSE: 96 mg/dL (ref 65–99)
Potassium: 3.7 mmol/L (ref 3.5–5.1)
Sodium: 138 mmol/L (ref 135–145)

## 2016-03-18 LAB — CBC WITH DIFFERENTIAL/PLATELET
Basophils Absolute: 0 10*3/uL (ref 0.0–0.1)
Basophils Relative: 1 %
EOS ABS: 0 10*3/uL (ref 0.0–0.7)
EOS PCT: 1 %
HEMATOCRIT: 25.9 % — AB (ref 36.0–46.0)
HEMOGLOBIN: 7.7 g/dL — AB (ref 12.0–15.0)
LYMPHS ABS: 2.6 10*3/uL (ref 0.7–4.0)
LYMPHS PCT: 46 %
MCH: 20.6 pg — AB (ref 26.0–34.0)
MCHC: 29.7 g/dL — AB (ref 30.0–36.0)
MCV: 69.3 fL — AB (ref 78.0–100.0)
MONO ABS: 0.4 10*3/uL (ref 0.1–1.0)
Monocytes Relative: 6 %
Neutro Abs: 2.6 10*3/uL (ref 1.7–7.7)
Neutrophils Relative %: 46 %
Platelets: 221 10*3/uL (ref 150–400)
RBC: 3.74 MIL/uL — AB (ref 3.87–5.11)
RDW: 17.7 % — AB (ref 11.5–15.5)
Smear Review: ADEQUATE
WBC: 5.7 10*3/uL (ref 4.0–10.5)

## 2016-03-18 LAB — I-STAT BETA HCG BLOOD, ED (MC, WL, AP ONLY): I-stat hCG, quantitative: <5 m[IU]/mL (ref <5)

## 2016-03-18 NOTE — ED Notes (Signed)
Dr. Nanavati at bedside 

## 2016-03-18 NOTE — ED Notes (Signed)
Dr Rhunette CroftNanavati came to nursing desk for chaperone, RN went into room with EDP, no pt and family seen in room, gown on bed,

## 2016-03-18 NOTE — ED Notes (Signed)
Lab called to advised that pt was A-, Dr Rhunette CroftNanavati notified and also aware of pt's HGB of 7.7, no additional orders given

## 2016-03-18 NOTE — ED Provider Notes (Signed)
CSN: [id number]     Arrival date & time 03/17/16  2332 History  By signing my name below, I, Emmanuella Mensah, attest that this documentation has been prepared under the direction and in the presence of Derwood KaplanAnkit Lytle Malburg, MD. Electronically Signed: Angelene GiovanniEmmanuella Mensah, ED Scribe. 03/18/2016. 1:08 AM.    Chief Complaint  Patient presents with  . Vaginal Bleeding    The history is provided by the patient. No language interpreter was used.   HPI Comments: Laurie [name] is a 20 y.o. female who presents to the Emergency Department complaining of gradually worsening vaginal bleeding with clots onset yesterday. She reports associated mild suprabubic abdominal pain. She adds that she passed a small amount of mucus in her blood several hours ago and she has used approx. 3-4 pads since onset of bleeding. She explains that she had a child 2 months ago and could be 9 months pregnant although she had a negative urine pregnancy test here in the ED. No fever, chills, or n/v.   Past Medical History  Diagnosis Date  . Chlamydia   . Pregnant   . History of heroin abuse   . Asthma     "grew out"  . Hepatitis C June 2016  . Infection     UTI  . Anxiety   . Depression     doing really good  . Gestational diabetes    Past Surgical History  Procedure Laterality Date  . No past surgeries    . Cesarean section N/A 01/11/2016    Procedure: CESAREAN SECTION;  Surgeon: Brock Badharles A Harper, MD;  Location: [facility];  Service: Obstetrics;  Laterality: N/A;   Family History  Problem Relation Age of Onset  . Diabetes Mother   . Pulmonary embolism Mother   . Stroke Mother   . Epilepsy Brother   . Cancer Paternal Grandmother     liver   Social History  Substance Use Topics  . Smoking status: Current Some Day Smoker -- 0.50 packs/day    Types: Cigarettes  . Smokeless tobacco: Never Used     Comment: quit with + preg test  . Alcohol Use: No     Comment: Occas use.; none since mid Sept   OB History    Gravida  Para Term Preterm AB TAB SAB Ectopic Multiple Living   4 1 1  0 1 0 1 0 0 1     Review of Systems  Constitutional: Negative for fever and chills.  Gastrointestinal: Positive for abdominal pain. Negative for nausea and vomiting.  Genitourinary: Positive for vaginal bleeding.  All other systems reviewed and are negative.     Allergies  Penicillins  Home Medications   Prior to Admission medications   Not on File   BP 115/58 mmHg  Pulse 66  Temp(Src) 98.1 F (36.7 C) (Oral)  Resp 16  Wt 160 lb (72.576 kg)  SpO2 100%  LMP 04/21/2015 Physical Exam  Constitutional: She is oriented to person, place, and time. She appears well-developed and well-nourished.  HENT:  Head: Normocephalic and atraumatic.  Cardiovascular: Normal rate.   Pulmonary/Chest: Effort normal.  Abdominal: Soft. There is tenderness. There is no guarding.  Suprapubic tenderness  Neurological: She is alert and oriented to person, place, and time.  Skin: Skin is warm and dry.  Psychiatric: She has a normal mood and affect.  Nursing note and vitals reviewed.   ED Course  Procedures (including critical care time) DIAGNOSTIC STUDIES: Oxygen Saturation is 100% on RA, normal by my  interpretation.    COORDINATION OF CARE: 12:44 AM- Pt advised of plan for treatment and pt agrees. Pt will receive pelvic examination.    Labs Review Labs Reviewed  CBC WITH DIFFERENTIAL/PLATELET - Abnormal; Notable for the following:    RBC 3.74 (*)    Hemoglobin 7.7 (*)    HCT 25.9 (*)    MCV 69.3 (*)    MCH 20.6 (*)    MCHC 29.7 (*)    RDW 17.7 (*)    All other components within normal limits  BASIC METABOLIC PANEL - Abnormal; Notable for the following:    BUN 21 (*)    Calcium 8.6 (*)    All other components within normal limits  WET PREP, GENITAL  URINALYSIS, ROUTINE W REFLEX MICROSCOPIC (NOT AT [facility])  I-STAT BETA HCG BLOOD, ED ([facility], [facility], [facility] ONLY)  ABO/RH  GC/CHLAMYDIA PROBE AMP ([place]) NOT AT [facility]    Imaging  Review No results found.   Derwood Kaplan, MD has personally reviewed and evaluated these images and lab results as part of his medical decision-making.   EKG Interpretation None      MDM   Final diagnoses:  Vaginal bleeding  Anemia due to blood loss, acute    Pt comes in with cc of vaginal bleeding, concerns for miscarriage. She had a neg Upreg yday. Today, she started having vaginal bleeding, and she passed a large clot which got her concerned. She on exam has lower quadrant pain. She delivered end of March - informed her that it is highly unlikely that she is [redacted] weeks pregnant [redacted] weeks after her pregnancy. We did hcG, it was neg, checked CBC as well. I went and informed the lab results to the patient, and advised her that likely she is re-starting her periods. She was frustrated. I went to update another patient, and when we went back for pelvic exam pt had left. We never got to address Hb of 7.9.    Derwood Kaplan, MD 03/18/16 [phone]

## 2016-06-18 ENCOUNTER — Encounter (HOSPITAL_BASED_OUTPATIENT_CLINIC_OR_DEPARTMENT_OTHER): Payer: Self-pay | Admitting: *Deleted

## 2016-06-18 ENCOUNTER — Emergency Department (HOSPITAL_BASED_OUTPATIENT_CLINIC_OR_DEPARTMENT_OTHER)
Admission: EM | Admit: 2016-06-18 | Discharge: 2016-06-18 | Disposition: A | Discharge status: Home / Self Care | Payer: Medicaid Other | Attending: Emergency Medicine | Admitting: Emergency Medicine

## 2016-06-18 ENCOUNTER — Emergency Department (HOSPITAL_BASED_OUTPATIENT_CLINIC_OR_DEPARTMENT_OTHER): Payer: Medicaid Other

## 2016-06-18 DIAGNOSIS — Z3A01 Less than 8 weeks gestation of pregnancy: Secondary | ICD-10-CM | POA: Diagnosis not present

## 2016-06-18 DIAGNOSIS — J45909 Unspecified asthma, uncomplicated: Secondary | ICD-10-CM | POA: Insufficient documentation

## 2016-06-18 DIAGNOSIS — O26891 Other specified pregnancy related conditions, first trimester: Secondary | ICD-10-CM | POA: Diagnosis present

## 2016-06-18 DIAGNOSIS — F17210 Nicotine dependence, cigarettes, uncomplicated: Secondary | ICD-10-CM | POA: Insufficient documentation

## 2016-06-18 DIAGNOSIS — O468X1 Other antepartum hemorrhage, first trimester: Secondary | ICD-10-CM

## 2016-06-18 DIAGNOSIS — O200 Threatened abortion: Secondary | ICD-10-CM

## 2016-06-18 DIAGNOSIS — O418X10 Other specified disorders of amniotic fluid and membranes, first trimester, not applicable or unspecified: Secondary | ICD-10-CM

## 2016-06-18 DIAGNOSIS — O3481 Maternal care for other abnormalities of pelvic organs, first trimester: Secondary | ICD-10-CM | POA: Diagnosis not present

## 2016-06-18 DIAGNOSIS — R102 Pelvic and perineal pain: Secondary | ICD-10-CM | POA: Insufficient documentation

## 2016-06-18 DIAGNOSIS — N83201 Unspecified ovarian cyst, right side: Secondary | ICD-10-CM

## 2016-06-18 DIAGNOSIS — O99331 Smoking (tobacco) complicating pregnancy, first trimester: Secondary | ICD-10-CM | POA: Diagnosis not present

## 2016-06-18 LAB — CBC
HCT: 32.2 % — ABNORMAL LOW (ref 36.0–46.0)
HEMOGLOBIN: 10.2 g/dL — AB (ref 12.0–15.0)
MCH: 20.8 pg — AB (ref 26.0–34.0)
MCHC: 31.7 g/dL (ref 30.0–36.0)
MCV: 65.7 fL — AB (ref 78.0–100.0)
Platelets: 189 10*3/uL (ref 150–400)
RBC: 4.9 MIL/uL (ref 3.87–5.11)
RDW: 23.2 % — ABNORMAL HIGH (ref 11.5–15.5)
WBC: 6.2 10*3/uL (ref 4.0–10.5)

## 2016-06-18 LAB — BASIC METABOLIC PANEL
Anion gap: 7 (ref 5–15)
BUN: 12 mg/dL (ref 6–20)
CHLORIDE: 107 mmol/L (ref 101–111)
CO2: 22 mmol/L (ref 22–32)
CREATININE: 0.6 mg/dL (ref 0.44–1.00)
Calcium: 9 mg/dL (ref 8.9–10.3)
GFR calc Af Amer: >60 mL/min (ref >60)
GFR calc non Af Amer: >60 mL/min (ref >60)
GLUCOSE: 85 mg/dL (ref 65–99)
POTASSIUM: 4.7 mmol/L (ref 3.5–5.1)
Sodium: 136 mmol/L (ref 135–145)

## 2016-06-18 LAB — URINE MICROSCOPIC-ADD ON

## 2016-06-18 LAB — URINALYSIS, ROUTINE W REFLEX MICROSCOPIC
Bilirubin Urine: NEGATIVE
GLUCOSE, UA: NEGATIVE mg/dL
KETONES UR: NEGATIVE mg/dL
Nitrite: POSITIVE — AB
PH: 7.5 (ref 5.0–8.0)
Protein, ur: NEGATIVE mg/dL
Specific Gravity, Urine: 1.014 (ref 1.005–1.030)

## 2016-06-18 LAB — WET PREP, GENITAL
CLUE CELLS WET PREP: NONE SEEN
Sperm: NONE SEEN
TRICH WET PREP: NONE SEEN
Yeast Wet Prep HPF POC: NONE SEEN

## 2016-06-18 LAB — PREGNANCY, URINE: Preg Test, Ur: POSITIVE — AB

## 2016-06-18 LAB — HCG, QUANTITATIVE, PREGNANCY: hCG, Beta Chain, Quant, S: 108643 m[IU]/mL — ABNORMAL HIGH (ref <5)

## 2016-06-18 MED ORDER — RHO D IMMUNE GLOBULIN 1500 UNIT/2ML IJ SOSY
300.0000 ug | PREFILLED_SYRINGE | Freq: Once | INTRAMUSCULAR | Status: AC
  Administered 2016-06-18: 300 ug via INTRAMUSCULAR

## 2016-06-18 MED ORDER — RHO D IMMUNE GLOBULIN 1500 UNIT/2ML IJ SOSY
PREFILLED_SYRINGE | INTRAMUSCULAR | Status: AC
  Administered 2016-06-18: 300 ug via INTRAMUSCULAR
  Filled 2016-06-18: qty 2

## 2016-06-18 NOTE — ED Triage Notes (Signed)
She had a positive home pregnancy test last night. Vaginal bleeding heavy like a period that started at 3am after having sex per pt.

## 2016-06-18 NOTE — Discharge Instructions (Signed)
Please read and follow all provided instructions.  Your diagnoses today include:  1. Threatened abortion in first trimester   2. Subchorionic bleed, first trimester   3. Cyst of right ovary     Tests performed today include:  Blood counts and electrolytes  Urine test to look for infection and pregnancy (in women)  Ultrasound - shows pregnancy in the uterus at 6 weeks and 2 days, shows bleeding around the placenta  Wet prep - no other vaginal infection  Vital signs. See below for your results today.   Medications prescribed:   None  Take any prescribed medications only as directed.  Home care instructions:   Follow any educational materials contained in this packet.  Follow-up instructions: Please follow-up with your OB/GYN in the next 7 days for further evaluation of your symptoms.    Return instructions:  SEEK IMMEDIATE MEDICAL ATTENTION IF:  The pain does not go away or becomes severe   A temperature above 101F develops   Repeated vomiting occurs (multiple episodes)   The pain becomes localized to portions of the abdomen. The right side could possibly be appendicitis. In an adult, the left lower portion of the abdomen could be colitis or diverticulitis.   Blood is being passed in stools or vomit (bright red or black tarry stools)   You develop chest pain, difficulty breathing, dizziness or fainting, or become confused, poorly responsive, or inconsolable (young children)  If you have any other emergent concerns regarding your health  Your vital signs today were: BP 113/68 (BP Location: Left Arm)    Pulse 63    Temp 97.9 F (36.6 C) (Oral)    Resp 18    Ht 5\' 3"  (1.6 m)    Wt 54.4 kg    LMP 06/06/2016    SpO2 100%    Breastfeeding? Unknown Comment: she had a positive home preg test last night.   BMI 21.26 kg/m  If your blood pressure (bp) was elevated above 135/85 this visit, please have this repeated by your doctor within one month. --------------

## 2016-06-18 NOTE — ED Provider Notes (Signed)
[facility] Provider Note   CSN: [id number] Arrival date & time: 06/18/16  1106     History   Chief Complaint Chief Complaint  Patient presents with  . Abdominal Pain  . Vaginal Bleeding    HPI Laurie [name] is a 20 y.o. female.  Patient who is Rh-, gave birth in 12/2015 -- presents with vaginal bleeding after having a positive home pregnancy test last night. She states that her last normal menstrual period was on 06/06/2016. Bleeding stopped and then resumed starting with spotting for several days and then heavier bleeding this morning without clots. Patient has some mild left lower quadrant abdominal cramping. She feels a pressure sensation and increased urinary frequency with micturition. She denies fevers, chest pain, vomiting, or diarrhea. No treatments prior to arrival. The onset of this condition was acute. The course is constant. Aggravating factors: none. Alleviating factors: none.        Past Medical History:  Diagnosis Date  . Anxiety   . Asthma    "grew out"  . Chlamydia   . Depression    doing really good  . Gestational diabetes   . Hepatitis C June 2016  . History of heroin abuse   . Infection    UTI  . Pregnant     Patient Active Problem List   Diagnosis Date Noted  . S/P cesarean section 01/12/2016  . Cholestasis of pregnancy in third trimester 01/10/2016  . Polysubstance abuse 08/31/2015    Past Surgical History:  Procedure Laterality Date  . CESAREAN SECTION N/A 01/11/2016   Procedure: CESAREAN SECTION;  Surgeon: Brock Bad, MD;  Location: [facility];  Service: Obstetrics;  Laterality: N/A;  . NO PAST SURGERIES      OB History    Gravida Para Term Preterm AB Living   4 1 1  0 1 1   SAB TAB Ectopic Multiple Live Births   1 0 0 0 1       Home Medications    Prior to Admission medications   Not on File    Family History Family History  Problem Relation Age of Onset  . Diabetes Mother   . Pulmonary embolism Mother     . Stroke Mother   . Cancer Paternal Grandmother     liver  . Epilepsy Brother     Social History Social History  Substance Use Topics  . Smoking status: Current Some Day Smoker    Packs/day: 0.50    Types: Cigarettes  . Smokeless tobacco: Never Used     Comment: quit with + preg test  . Alcohol use No     Comment: Occas use.; none since mid Sept     Allergies   Penicillins   Review of Systems Review of Systems  Constitutional: Negative for fever.  HENT: Negative for rhinorrhea and sore throat.   Eyes: Negative for redness.  Respiratory: Negative for cough.   Cardiovascular: Negative for chest pain.  Gastrointestinal: Negative for abdominal pain, diarrhea, nausea and vomiting.  Genitourinary: Positive for dysuria, frequency, pelvic pain and vaginal bleeding. Negative for hematuria and vaginal discharge.  Musculoskeletal: Negative for myalgias.  Skin: Negative for rash.  Neurological: Negative for headaches.     Physical Exam Updated Vital Signs BP 113/68 (BP Location: Left Arm)   Pulse 63   Temp 97.9 F (36.6 C) (Oral)   Resp 18   Ht 5\' 3"  (1.6 m)   Wt 54.4 kg   LMP 06/06/2016   SpO2  100%   Breastfeeding? Unknown Comment: she had a positive home preg test last night.  BMI 21.26 kg/m   Physical Exam  Constitutional: She appears well-developed and well-nourished.  HENT:  Head: Normocephalic and atraumatic.  Eyes: Conjunctivae are normal. Right eye exhibits no discharge. Left eye exhibits no discharge.  Neck: Normal range of motion. Neck supple.  Cardiovascular: Normal rate, regular rhythm and normal heart sounds.   Pulmonary/Chest: Effort normal and breath sounds normal.  Abdominal: Soft. There is no tenderness.  Genitourinary: There is no rash or tenderness on the right labia. There is no rash or tenderness on the left labia. Uterus is not tender. Cervix exhibits discharge (Blood). Cervix exhibits no motion tenderness. Right adnexum displays no mass, no  tenderness and no fullness. Left adnexum displays tenderness. Left adnexum displays no mass and no fullness. There is bleeding in the vagina. No tenderness in the vagina. No vaginal discharge found.  Genitourinary Comments: Cervical os closed  Neurological: She is alert.  Skin: Skin is warm and dry.  Psychiatric: She has a normal mood and affect.  Nursing note and vitals reviewed.    ED Treatments / Results  Labs (all labs ordered are listed, but only abnormal results are displayed) Labs Reviewed  WET PREP, GENITAL - Abnormal; Notable for the following:       Result Value   WBC, Wet Prep HPF POC MODERATE (*)    All other components within normal limits  URINALYSIS, ROUTINE W REFLEX MICROSCOPIC (NOT AT [facility]) - Abnormal; Notable for the following:    APPearance CLOUDY (*)    Hgb urine dipstick LARGE (*)    Nitrite POSITIVE (*)    Leukocytes, UA MODERATE (*)    All other components within normal limits  PREGNANCY, URINE - Abnormal; Notable for the following:    Preg Test, Ur POSITIVE (*)    All other components within normal limits  CBC - Abnormal; Notable for the following:    Hemoglobin 10.2 (*)    HCT 32.2 (*)    MCV 65.7 (*)    MCH 20.8 (*)    RDW 23.2 (*)    All other components within normal limits  HCG, QUANTITATIVE, PREGNANCY - Abnormal; Notable for the following:    hCG, Beta Chain, [place] LongestQuant, S [id number] (*)    All other components within normal limits  URINE MICROSCOPIC-ADD ON - Abnormal; Notable for the following:    Squamous Epithelial / LPF 0-5 (*)    Bacteria, UA MANY (*)    All other components within normal limits  URINE CULTURE  BASIC METABOLIC PANEL  GC/CHLAMYDIA PROBE AMP ([place]) NOT AT [facility]    Radiology [place] Ob Comp Less 14 Wks  Result Date: 06/18/2016 CLINICAL DATA:  Heavy vaginal bleeding for 1 day EXAM: OBSTETRIC <14 WK US AND TRANSVAGINAL OB US TECHNIQUE: Both transabdominal and transvaginal ultrasound examinations were performed for complete  evaluation of the gestation as well as the maternal uterus, adnexal regions, and pelvic cul-de-sac. Transvaginal technique was performed to assess early pregnancy. COMPARISON:  None. FINDINGS: Intrauterine gestational sac: Single Yolk sac:  Present Embryo:  Present Cardiac Activity: Present Heart Rate: 119  bpm CRL:  5.8  mm   6 w   3 d                  US EDC: 02/08/2017 Subchorionic hemorrhage:  Small-moderate subchorionic hemorrhage. Maternal uterus/adnexae: 1.9 x 1.8 x 1.7 cm complex right ovarian mass with peripheral Doppler flow likely  reflecting a corpus luteum cyst of pregnancy. Normal left ovary. No pelvic free fluid. IMPRESSION: 1. Single live intrauterine pregnancy dating 6 weeks 3 days. 2. Small -moderate subchorionic hemorrhage. Electronically Signed   By: Elige Ko   On: 06/18/2016 12:43   US Ob Transvaginal  Result Date: 06/18/2016 CLINICAL DATA:  Heavy vaginal bleeding with positive pregnancy test. EXAM: OBSTETRIC <14 WK [place] AND TRANSVAGINAL OB US TECHNIQUE: Both transabdominal and transvaginal ultrasound examinations were performed for complete evaluation of the gestation as well as the maternal uterus, adnexal regions, and pelvic cul-de-sac. Transvaginal technique was performed to assess early pregnancy. COMPARISON:  None. FINDINGS: Intrauterine gestational sac: Single Visualized. Yolk sac:  Visualized. Embryo:  Visualized. Cardiac Activity: Visualized. Heart Rate: 119  bpm CRL:  5.8  mm   6 w   3 d                  [place] EDC: 02/08/2017 Subchorionic hemorrhage:  Small to moderate Maternal uterus/adnexae: Probable 18 mm hemorrhagic corpus luteum cyst in the right ovary. Left ovary unremarkable. IMPRESSION: Single living intrauterine gestation at estimated 6 week 3 day gestational age by crown-rump length. Small to moderate subchorionic hemorrhage. Electronically Signed   By: Kennith Center M.D.   On: 06/18/2016 12:56    Procedures Procedures (including critical care time)  Medications  Ordered in ED Medications  rho (d) immune globulin (RHIG/RHOPHYLAC) injection 300 mcg (300 mcg Intramuscular Given 06/18/16 1324)     Initial Impression / Assessment and Plan / ED Course  I have reviewed the triage vital signs and the nursing notes.  Pertinent labs & imaging results that were available during my care of the patient were reviewed by me and considered in my medical decision making (see chart for details).  Clinical Course   Patient seen and examined. [place], quant, labs, UA ordered. UPT +. Will need to confirm IUP.   1:52 PM IVP confirmed. Labs and results discussed with patient. Discussed threatened abortion and expectant management. Encouraged follow-up with OB/GYN in one week. Return to the emergency department with worsening severe pain, heavier bleeding, lightheadedness, syncope, shortness of breath, or other concerns. Patient given injection of RhoGAM prior to discharge given her Rh- status.  Final Clinical Impressions(s) / ED Diagnoses   Final diagnoses:  Threatened abortion in first trimester  Subchorionic bleed, first trimester  Cyst of right ovary   Patient with first trimester vaginal bleeding secondary to subchorionic hemorrhage. No ectopic suspected. This signs and symptoms of anemia. Workup otherwise unremarkable. Questionable UTI given contamination with blood, culture sent. Patient has reliable OB/GYN follow-up. Return instructions as above. Feel safe for discharge at this time.   New Prescriptions There are no discharge medications for this patient.    Renne Crigler, PA-C 06/18/16 1357    Nira Conn, MD 06/18/16 [phone]

## 2016-06-19 LAB — GC/CHLAMYDIA PROBE AMP (~~LOC~~) NOT AT ARMC
CHLAMYDIA, DNA PROBE: NEGATIVE
NEISSERIA GONORRHEA: NEGATIVE

## 2016-06-20 LAB — URINE CULTURE: Culture: >=100000 — AB

## 2016-06-21 ENCOUNTER — Telehealth (HOSPITAL_BASED_OUTPATIENT_CLINIC_OR_DEPARTMENT_OTHER): Payer: Self-pay | Admitting: Emergency Medicine

## 2016-06-21 ENCOUNTER — Other Ambulatory Visit: Payer: Self-pay | Admitting: Obstetrics & Gynecology

## 2016-06-21 DIAGNOSIS — O209 Hemorrhage in early pregnancy, unspecified: Secondary | ICD-10-CM

## 2016-06-21 NOTE — Progress Notes (Signed)
ED Antimicrobial Stewardship Positive Culture Follow Up   Laurie [name] is an 20 y.o. female who [facility] with a chief complaint of  Chief Complaint  Patient presents with  . Abdominal Pain  . Vaginal Bleeding    Recent Results (from the past 720 hour(s))  Urine culture     Status: Abnormal   Collection Time: 06/18/16 11:10 AM  Result Value Ref Range Status   Specimen Description URINE, CLEAN CATCH  Final   Special Requests NONE  Final   Culture >=100,000 COLONIES/mL ESCHERICHIA COLI (A)  Final   Report Status 06/20/2016 FINAL  Final   Organism ID, Bacteria ESCHERICHIA COLI (A)  Final      Susceptibility   Escherichia coli - MIC*    AMPICILLIN >=32 RESISTANT Resistant     CEFAZOLIN <=4 SENSITIVE Sensitive     CEFTRIAXONE <=1 SENSITIVE Sensitive     CIPROFLOXACIN <=0.25 SENSITIVE Sensitive     GENTAMICIN <=1 SENSITIVE Sensitive     IMIPENEM <=0.25 SENSITIVE Sensitive     NITROFURANTOIN <=16 SENSITIVE Sensitive     TRIMETH/SULFA <=20 SENSITIVE Sensitive     AMPICILLIN/SULBACTAM 4 SENSITIVE Sensitive     PIP/TAZO <=4 SENSITIVE Sensitive     Extended ESBL NEGATIVE Sensitive     * >=100,000 COLONIES/mL ESCHERICHIA COLI  Wet prep, genital     Status: Abnormal   Collection Time: 06/18/16 12:45 PM  Result Value Ref Range Status   Yeast Wet Prep HPF POC NONE SEEN NONE SEEN Final   Trich, Wet Prep NONE SEEN NONE SEEN Final   Clue Cells Wet Prep HPF POC NONE SEEN NONE SEEN Final   WBC, Wet Prep HPF POC MODERATE (A) NONE SEEN Final   Sperm NONE SEEN  Final    [x]  Patient discharged originally without antimicrobial agent and treatment is now indicated  New antibiotic prescription: Cephalexin 500mg  PO TID x 7 days See OB/GYN for follow-up  ED Provider: Dierdre ForthHannah Muthersbaugh, PA-C   Sallee Provencalurner, Malik Ruffino S 06/21/2016, 9:43 AM Infectious Diseases Pharmacist Phone# [phone]

## 2016-06-21 NOTE — Progress Notes (Signed)
Patient called with continued significant bleeding.  Follow up scan scheduled for next week.  Tereso NewcomerUgonna A Herley Bernardini, MD

## 2016-06-21 NOTE — Telephone Encounter (Signed)
Post ED Visit - Positive Culture Follow-up: Successful Patient Follow-Up  Culture assessed and recommendations reviewed by: []  Enzo BiNathan Batchelder, Pharm.D. []  Celedonio MiyamotoJeremy Frens, Pharm.D., BCPS []  Garvin FilaMike Maccia, Pharm.D. []  Georgina PillionElizabeth Martin, Pharm.D., BCPS []  [place]h Pham, [place]arm.D., BCPS, AAHIVP [x]  Estella HuskMichelle Turner, Pharm.D., BCPS, AAHIVP []  Tennis Mustassie Stewart, Pharm.D. []  Sherle Poeob Vincent, [place]arm.D.  Positive urine culture  [x]  Patient discharged without antimicrobial prescription and treatment is now indicated []  Organism is resistant to prescribed ED discharge antimicrobial []  Patient with positive blood cultures  Changes discussed with ED provider: Dierdre ForthHannah Muthersbaugh PA New antibiotic prescription start Cephalexin 500mg  po tid x 7 days Attempting to contact patient    Berle MullMiller, Katherine Tout 06/21/2016, 3:15 PM

## 2016-06-29 ENCOUNTER — Telehealth (HOSPITAL_BASED_OUTPATIENT_CLINIC_OR_DEPARTMENT_OTHER): Payer: Self-pay

## 2016-06-29 NOTE — Telephone Encounter (Signed)
Post ED Visit - Positive Culture Follow-up: Successful Patient Follow-Up  Culture assessed and recommendations reviewed by: []  Enzo BiNathan Batchelder, Pharm.D. []  Celedonio MiyamotoJeremy Frens, Pharm.D., BCPS []  Garvin FilaMike Maccia, Pharm.D. []  Georgina PillionElizabeth Martin, Pharm.D., BCPS []  [place]oolingMinh Pham, [address] BoulevardPharm.D., BCPS, AAHIVP [x]  Estella HuskMichelle Turner, Pharm.D., BCPS, AAHIVP []  Tennis Mustassie Stewart, Pharm.D. []  Sherle Poeob Vincent, [address] BoulevardPharm[address]  Positive Urine culture  [x]  Patient discharged without antimicrobial prescription and treatment is now indicated []  Organism is resistant to prescribed ED discharge antimicrobial []  Patient with positive blood cultures  Changes discussed with ED provider: Dierdre ForthHannah Muthersbaugh PA New antibiotic prescription Keflex 500mg  TID x 7 days Called to [facility] [address]  Contacted patient, date 06/29/16, time 1751   Jerry CarasCullom, Kayin Osment Burnett 06/29/2016, 5:50 PM

## 2016-07-01 ENCOUNTER — Ambulatory Visit (HOSPITAL_COMMUNITY): Admission: RE | Admit: 2016-07-01 | Payer: Medicaid Other | Source: Ambulatory Visit

## 2016-07-25 ENCOUNTER — Encounter: Payer: Self-pay | Admitting: Obstetrics and Gynecology

## 2016-07-25 ENCOUNTER — Encounter: Payer: Medicaid Other | Admitting: Obstetrics and Gynecology

## 2016-08-02 ENCOUNTER — Encounter (HOSPITAL_COMMUNITY): Payer: Self-pay | Admitting: Emergency Medicine

## 2016-08-02 ENCOUNTER — Emergency Department (HOSPITAL_COMMUNITY)
Admission: EM | Admit: 2016-08-02 | Discharge: 2016-08-02 | Disposition: A | Discharge status: Home / Self Care | Payer: Medicaid Other | Attending: Dermatology | Admitting: Dermatology

## 2016-08-02 DIAGNOSIS — Y929 Unspecified place or not applicable: Secondary | ICD-10-CM | POA: Diagnosis not present

## 2016-08-02 DIAGNOSIS — O9A211 Injury, poisoning and certain other consequences of external causes complicating pregnancy, first trimester: Secondary | ICD-10-CM | POA: Diagnosis not present

## 2016-08-02 DIAGNOSIS — Y999 Unspecified external cause status: Secondary | ICD-10-CM | POA: Insufficient documentation

## 2016-08-02 DIAGNOSIS — Z5321 Procedure and treatment not carried out due to patient leaving prior to being seen by health care provider: Secondary | ICD-10-CM | POA: Diagnosis not present

## 2016-08-02 DIAGNOSIS — F419 Anxiety disorder, unspecified: Secondary | ICD-10-CM | POA: Insufficient documentation

## 2016-08-02 DIAGNOSIS — Y939 Activity, unspecified: Secondary | ICD-10-CM | POA: Diagnosis not present

## 2016-08-02 DIAGNOSIS — R109 Unspecified abdominal pain: Secondary | ICD-10-CM | POA: Diagnosis not present

## 2016-08-02 DIAGNOSIS — Z3A12 12 weeks gestation of pregnancy: Secondary | ICD-10-CM | POA: Diagnosis not present

## 2016-08-02 NOTE — ED Notes (Signed)
Bed: WTR8 Expected date:  Expected time:  Means of arrival:  Comments: 

## 2016-08-02 NOTE — ED Notes (Signed)
Pt states desire to leave without being seen. Pt just wants to go to bed. Pt states they are ok now.
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
## 2016-09-21 ENCOUNTER — Inpatient Hospital Stay (HOSPITAL_COMMUNITY)
Admission: AD | Admit: 2016-09-21 | Discharge: 2016-09-21 | Disposition: A | Discharge status: Home / Self Care | Payer: 59 | Source: Ambulatory Visit | Attending: Obstetrics & Gynecology | Admitting: Obstetrics & Gynecology

## 2016-09-21 ENCOUNTER — Encounter (HOSPITAL_COMMUNITY): Payer: Self-pay

## 2016-09-21 DIAGNOSIS — F17210 Nicotine dependence, cigarettes, uncomplicated: Secondary | ICD-10-CM | POA: Insufficient documentation

## 2016-09-21 DIAGNOSIS — O26892 Other specified pregnancy related conditions, second trimester: Secondary | ICD-10-CM | POA: Insufficient documentation

## 2016-09-21 DIAGNOSIS — O99332 Smoking (tobacco) complicating pregnancy, second trimester: Secondary | ICD-10-CM | POA: Insufficient documentation

## 2016-09-21 DIAGNOSIS — R109 Unspecified abdominal pain: Secondary | ICD-10-CM | POA: Diagnosis not present

## 2016-09-21 DIAGNOSIS — Z3A20 20 weeks gestation of pregnancy: Secondary | ICD-10-CM | POA: Insufficient documentation

## 2016-09-21 LAB — URINALYSIS, ROUTINE W REFLEX MICROSCOPIC
BILIRUBIN URINE: NEGATIVE
Glucose, UA: NEGATIVE mg/dL
HGB URINE DIPSTICK: NEGATIVE
KETONES UR: 15 mg/dL — AB
Leukocytes, UA: NEGATIVE
NITRITE: NEGATIVE
PH: 7 (ref 5.0–8.0)
Protein, ur: NEGATIVE mg/dL
Specific Gravity, Urine: 1.025 (ref 1.005–1.030)

## 2016-09-21 NOTE — MAU Note (Signed)
Patient has not been for prenatal care, [facility] today having the same pain in lower abdomen as she had before, having pinkish discharge.

## 2016-09-21 NOTE — MAU Provider Note (Signed)
History   G5P1021 @ 20 wks in with abd pain that has been going on for a while now. Pt is prev c/s in March.  CSN: [id number]  Arrival date & time 09/21/16  1658   None     Chief Complaint  Patient presents with  . Abdominal Pain    HPI  Past Medical History:  Diagnosis Date  . Anxiety   . Asthma    "grew out"  . Chlamydia   . Depression    doing really good  . Gestational diabetes   . Hepatitis C June 2016  . History of heroin abuse   . Infection    UTI  . Pregnant     Past Surgical History:  Procedure Laterality Date  . CESAREAN SECTION N/A 01/11/2016   Procedure: CESAREAN SECTION;  Surgeon: Brock Badharles A Harper, MD;  Location: [facility];  Service: Obstetrics;  Laterality: N/A;  . NO PAST SURGERIES      Family History  Problem Relation Age of Onset  . Diabetes Mother   . Pulmonary embolism Mother   . Stroke Mother   . Cancer Paternal Grandmother     liver  . Epilepsy Brother     Social History  Substance Use Topics  . Smoking status: Current Some Day Smoker    Packs/day: 0.50    Types: Cigarettes  . Smokeless tobacco: Never Used     Comment: quit with + preg test  . Alcohol use No     Comment: Occas use.; none since mid Sept    OB History    Gravida Para Term Preterm AB Living   5 1 1  0 1 1   SAB TAB Ectopic Multiple Live Births   1 0 0 0 1      Review of Systems  Constitutional: Negative.   HENT: Negative.   Eyes: Negative.   Respiratory: Negative.   Cardiovascular: Negative.   Gastrointestinal: Positive for abdominal pain.  Endocrine: Negative.   Genitourinary: Negative.   Musculoskeletal: Negative.   Skin: Negative.   Allergic/Immunologic: Negative.   Neurological: Negative.   Hematological: Negative.   Psychiatric/Behavioral: Negative.     Allergies  Penicillins  Home Medications    BP 108/56 (BP Location: Right Arm)   Pulse 92   Temp 98.4 F (36.9 C) (Oral)   Resp 18   Ht 5\' 3"  (1.6 m)   Wt 128 lb (58.1 kg)   LMP  06/06/2016   BMI 22.67 kg/m   Physical Exam  Constitutional: She is oriented to person, place, and time. She appears well-developed and well-nourished.  HENT:  Head: Normocephalic.  Eyes: Pupils are equal, round, and reactive to light.  Neck: Normal range of motion.  Cardiovascular: Normal rate, regular rhythm, normal heart sounds and intact distal pulses.   Pulmonary/Chest: Effort normal and breath sounds normal.  Abdominal: Soft. Bowel sounds are normal.  Musculoskeletal: Normal range of motion.  Neurological: She is alert and oriented to person, place, and time. She has normal reflexes.  Skin: Skin is warm and dry.  Psychiatric: She has a normal mood and affect. Her behavior is normal. Judgment and thought content normal.    MAU Course  Procedures (including critical care time)  Labs Reviewed  URINALYSIS, ROUTINE W REFLEX MICROSCOPIC (NOT AT [facility]) - Abnormal; Notable for the following:       Result Value   Ketones, ur 15 (*)    All other components within normal limits   No results found.  1. Abdominal pain in pregnancy, second trimester       MDM  Lengthy discussion with pt on effects of scar tissue on a growing uterus. Explained to pt that this pregnancy with have more discomfort involved. Pt verbalized understanding. Will d/c home.

## 2016-09-21 NOTE — Discharge Instructions (Signed)
Abdominal Pain During Pregnancy °Belly (abdominal) pain is common during pregnancy. Most of the time, it is not a serious problem. Other times, it can be a sign that something is wrong with the pregnancy. Always tell your doctor if you have belly pain. °Follow these instructions at home: °Monitor your belly pain for any changes. The following actions may help you feel better: °· Do not have sex (intercourse) or put anything in your vagina until you feel better. °· Rest until your pain stops. °· Drink clear fluids if you feel sick to your stomach (nauseous). Do not eat solid food until you feel better. °· Only take medicine as told by your doctor. °· Keep all doctor visits as told. °Get help right away if: °· You are bleeding, leaking fluid, or pieces of tissue come out of your vagina. °· You have more pain or cramping. °· You keep throwing up (vomiting). °· You have pain when you pee (urinate) or have blood in your pee. °· You have a fever. °· You do not feel your baby moving as much. °· You feel very weak or feel like passing out. °· You have trouble breathing, with or without belly pain. °· You have a very bad headache and belly pain. °· You have fluid leaking from your vagina and belly pain. °· You keep having watery poop (diarrhea). °· Your belly pain does not go away after resting, or the pain gets worse. °This information is not intended to replace advice given to you by your health care provider. Make sure you discuss any questions you have with your health care provider. °Document Released: 09/25/2009 Document Revised: 05/15/2016 Document Reviewed: 05/06/2013 °Elsevier Interactive Patient Education © 2017 [employer] Inc[employer] ° °

## 2016-10-24 ENCOUNTER — Encounter: Payer: Self-pay | Admitting: Family Medicine

## 2016-11-04 ENCOUNTER — Other Ambulatory Visit: Payer: Self-pay | Admitting: Obstetrics and Gynecology

## 2016-11-04 DIAGNOSIS — Z363 Encounter for antenatal screening for malformations: Secondary | ICD-10-CM

## 2016-11-06 ENCOUNTER — Other Ambulatory Visit: Payer: Medicaid Other

## 2016-11-11 ENCOUNTER — Ambulatory Visit (INDEPENDENT_AMBULATORY_CARE_PROVIDER_SITE_OTHER): Payer: 59

## 2016-11-11 ENCOUNTER — Encounter (INDEPENDENT_AMBULATORY_CARE_PROVIDER_SITE_OTHER): Payer: Self-pay

## 2016-11-11 DIAGNOSIS — O321XX1 Maternal care for breech presentation, fetus 1: Secondary | ICD-10-CM

## 2016-11-11 DIAGNOSIS — Z363 Encounter for antenatal screening for malformations: Secondary | ICD-10-CM

## 2016-11-11 DIAGNOSIS — Z3A27 27 weeks gestation of pregnancy: Secondary | ICD-10-CM | POA: Diagnosis not present

## 2016-11-11 NOTE — Progress Notes (Signed)
US 27+2 wks,breech,cx 4 cm,ant pl gr 2,normal ov's bilat,fhr 134 bpm,svp of fluid 6.2 cm,efw 1014 g 46%,anatomy complete,no obvious abnormalities seen,[name] 02/08/2017 by [place]

## 2016-11-13 ENCOUNTER — Inpatient Hospital Stay (HOSPITAL_COMMUNITY)
Admission: AD | Admit: 2016-11-13 | Discharge: 2016-12-24 | DRG: 765 | Disposition: A | Discharge status: Home / Self Care | Payer: 59 | Source: Ambulatory Visit | Attending: Obstetrics & Gynecology | Admitting: Obstetrics & Gynecology

## 2016-11-13 ENCOUNTER — Encounter (HOSPITAL_COMMUNITY): Payer: Self-pay

## 2016-11-13 DIAGNOSIS — Z6791 Unspecified blood type, Rh negative: Secondary | ICD-10-CM | POA: Diagnosis not present

## 2016-11-13 DIAGNOSIS — Z880 Allergy status to penicillin: Secondary | ICD-10-CM | POA: Diagnosis not present

## 2016-11-13 DIAGNOSIS — Z3A27 27 weeks gestation of pregnancy: Secondary | ICD-10-CM

## 2016-11-13 DIAGNOSIS — O328XX0 Maternal care for other malpresentation of fetus, not applicable or unspecified: Secondary | ICD-10-CM | POA: Diagnosis not present

## 2016-11-13 DIAGNOSIS — O26892 Other specified pregnancy related conditions, second trimester: Secondary | ICD-10-CM | POA: Diagnosis present

## 2016-11-13 DIAGNOSIS — F17210 Nicotine dependence, cigarettes, uncomplicated: Secondary | ICD-10-CM | POA: Diagnosis present

## 2016-11-13 DIAGNOSIS — Z3A32 32 weeks gestation of pregnancy: Secondary | ICD-10-CM | POA: Diagnosis not present

## 2016-11-13 DIAGNOSIS — K831 Obstruction of bile duct: Secondary | ICD-10-CM | POA: Diagnosis present

## 2016-11-13 DIAGNOSIS — A568 Sexually transmitted chlamydial infection of other sites: Secondary | ICD-10-CM | POA: Diagnosis present

## 2016-11-13 DIAGNOSIS — O9832 Other infections with a predominantly sexual mode of transmission complicating childbirth: Secondary | ICD-10-CM | POA: Diagnosis present

## 2016-11-13 DIAGNOSIS — Z3A28 28 weeks gestation of pregnancy: Secondary | ICD-10-CM

## 2016-11-13 DIAGNOSIS — O34211 Maternal care for low transverse scar from previous cesarean delivery: Secondary | ICD-10-CM | POA: Diagnosis present

## 2016-11-13 DIAGNOSIS — O24415 Gestational diabetes mellitus in pregnancy, controlled by oral hypoglycemic drugs: Secondary | ICD-10-CM | POA: Diagnosis not present

## 2016-11-13 DIAGNOSIS — Z98891 History of uterine scar from previous surgery: Secondary | ICD-10-CM | POA: Diagnosis not present

## 2016-11-13 DIAGNOSIS — F1910 Other psychoactive substance abuse, uncomplicated: Secondary | ICD-10-CM | POA: Diagnosis present

## 2016-11-13 DIAGNOSIS — D62 Acute posthemorrhagic anemia: Secondary | ICD-10-CM | POA: Diagnosis not present

## 2016-11-13 DIAGNOSIS — O24419 Gestational diabetes mellitus in pregnancy, unspecified control: Secondary | ICD-10-CM | POA: Diagnosis not present

## 2016-11-13 DIAGNOSIS — O42912 Preterm premature rupture of membranes, unspecified as to length of time between rupture and onset of labor, second trimester: Secondary | ICD-10-CM | POA: Diagnosis present

## 2016-11-13 DIAGNOSIS — O34219 Maternal care for unspecified type scar from previous cesarean delivery: Secondary | ICD-10-CM | POA: Diagnosis not present

## 2016-11-13 DIAGNOSIS — O42113 Preterm premature rupture of membranes, onset of labor more than 24 hours following rupture, third trimester: Secondary | ICD-10-CM | POA: Diagnosis not present

## 2016-11-13 DIAGNOSIS — O99334 Smoking (tobacco) complicating childbirth: Secondary | ICD-10-CM | POA: Diagnosis present

## 2016-11-13 DIAGNOSIS — O26899 Other specified pregnancy related conditions, unspecified trimester: Secondary | ICD-10-CM

## 2016-11-13 DIAGNOSIS — B182 Chronic viral hepatitis C: Secondary | ICD-10-CM | POA: Diagnosis present

## 2016-11-13 DIAGNOSIS — Z720 Tobacco use: Secondary | ICD-10-CM | POA: Diagnosis present

## 2016-11-13 DIAGNOSIS — O24414 Gestational diabetes mellitus in pregnancy, insulin controlled: Secondary | ICD-10-CM | POA: Diagnosis not present

## 2016-11-13 DIAGNOSIS — O24425 Gestational diabetes mellitus in childbirth, controlled by oral hypoglycemic drugs: Secondary | ICD-10-CM | POA: Diagnosis present

## 2016-11-13 DIAGNOSIS — O9842 Viral hepatitis complicating childbirth: Secondary | ICD-10-CM | POA: Diagnosis present

## 2016-11-13 DIAGNOSIS — O99119 Other diseases of the blood and blood-forming organs and certain disorders involving the immune mechanism complicating pregnancy, unspecified trimester: Secondary | ICD-10-CM

## 2016-11-13 DIAGNOSIS — D696 Thrombocytopenia, unspecified: Secondary | ICD-10-CM | POA: Diagnosis present

## 2016-11-13 DIAGNOSIS — O4593 Premature separation of placenta, unspecified, third trimester: Principal | ICD-10-CM | POA: Diagnosis present

## 2016-11-13 DIAGNOSIS — O98313 Other infections with a predominantly sexual mode of transmission complicating pregnancy, third trimester: Secondary | ICD-10-CM | POA: Diagnosis not present

## 2016-11-13 DIAGNOSIS — O99324 Drug use complicating childbirth: Secondary | ICD-10-CM | POA: Diagnosis present

## 2016-11-13 DIAGNOSIS — O4590 Premature separation of placenta, unspecified, unspecified trimester: Secondary | ICD-10-CM | POA: Diagnosis present

## 2016-11-13 DIAGNOSIS — O42919 Preterm premature rupture of membranes, unspecified as to length of time between rupture and onset of labor, unspecified trimester: Secondary | ICD-10-CM | POA: Diagnosis not present

## 2016-11-13 DIAGNOSIS — O26613 Liver and biliary tract disorders in pregnancy, third trimester: Secondary | ICD-10-CM

## 2016-11-13 DIAGNOSIS — O42913 Preterm premature rupture of membranes, unspecified as to length of time between rupture and onset of labor, third trimester: Secondary | ICD-10-CM

## 2016-11-13 DIAGNOSIS — O321XX0 Maternal care for breech presentation, not applicable or unspecified: Secondary | ICD-10-CM | POA: Diagnosis not present

## 2016-11-13 DIAGNOSIS — Z3A29 29 weeks gestation of pregnancy: Secondary | ICD-10-CM | POA: Diagnosis not present

## 2016-11-13 DIAGNOSIS — O2662 Liver and biliary tract disorders in childbirth: Secondary | ICD-10-CM | POA: Diagnosis present

## 2016-11-13 DIAGNOSIS — O26643 Intrahepatic cholestasis of pregnancy, third trimester: Secondary | ICD-10-CM | POA: Diagnosis present

## 2016-11-13 DIAGNOSIS — Z3A31 31 weeks gestation of pregnancy: Secondary | ICD-10-CM | POA: Diagnosis not present

## 2016-11-13 DIAGNOSIS — O9081 Anemia of the puerperium: Secondary | ICD-10-CM | POA: Diagnosis not present

## 2016-11-13 DIAGNOSIS — Z3A33 33 weeks gestation of pregnancy: Secondary | ICD-10-CM | POA: Diagnosis not present

## 2016-11-13 DIAGNOSIS — Z3A30 30 weeks gestation of pregnancy: Secondary | ICD-10-CM | POA: Diagnosis not present

## 2016-11-13 LAB — RAPID URINE DRUG SCREEN, HOSP PERFORMED
Amphetamines: NOT DETECTED
Barbiturates: NOT DETECTED
Benzodiazepines: NOT DETECTED
COCAINE: NOT DETECTED
OPIATES: NOT DETECTED
Tetrahydrocannabinol: NOT DETECTED

## 2016-11-13 LAB — TYPE AND SCREEN
ABO/RH(D): A NEG
Antibody Screen: NEGATIVE

## 2016-11-13 MED ORDER — DOCUSATE SODIUM 100 MG PO CAPS
100.0000 mg | ORAL_CAPSULE | Freq: Every day | ORAL | Status: DC
  Administered 2016-11-14 – 2016-12-20 (×34): 100 mg via ORAL
  Filled 2016-11-13 (×39): qty 1

## 2016-11-13 MED ORDER — MAGNESIUM SULFATE 50 % IJ SOLN
2.0000 g/h | INTRAVENOUS | Status: AC
  Administered 2016-11-13 – 2016-11-14 (×2): 2 g/h via INTRAVENOUS
  Filled 2016-11-13 (×2): qty 80

## 2016-11-13 MED ORDER — ZOLPIDEM TARTRATE 5 MG PO TABS
5.0000 mg | ORAL_TABLET | Freq: Every evening | ORAL | Status: DC | PRN
  Administered 2016-11-26 – 2016-11-27 (×2): 5 mg via ORAL
  Filled 2016-11-13 (×2): qty 1

## 2016-11-13 MED ORDER — ACETAMINOPHEN 325 MG PO TABS
650.0000 mg | ORAL_TABLET | ORAL | Status: DC | PRN
  Administered 2016-11-15 – 2016-12-13 (×4): 650 mg via ORAL
  Filled 2016-11-13 (×4): qty 2

## 2016-11-13 MED ORDER — CALCIUM CARBONATE ANTACID 500 MG PO CHEW
2.0000 | CHEWABLE_TABLET | ORAL | Status: DC | PRN
  Administered 2016-11-16: 400 mg via ORAL
  Filled 2016-11-13: qty 2

## 2016-11-13 MED ORDER — BETAMETHASONE SOD PHOS & ACET 6 (3-3) MG/ML IJ SUSP
12.0000 mg | INTRAMUSCULAR | Status: AC
  Administered 2016-11-13 – 2016-11-14 (×2): 12 mg via INTRAMUSCULAR
  Filled 2016-11-13 (×2): qty 2

## 2016-11-13 MED ORDER — AZITHROMYCIN 250 MG PO TABS: 1000.0000 mg | ORAL_TABLET | Freq: Once | ORAL | Status: DC

## 2016-11-13 MED ORDER — CLINDAMYCIN PHOSPHATE 900 MG/50ML IV SOLN: 900.0000 mg | Freq: Three times a day (TID) | INTRAVENOUS | Status: DC

## 2016-11-13 MED ORDER — AZITHROMYCIN 500 MG IV SOLR: 500.0000 mg | INTRAVENOUS | Status: DC

## 2016-11-13 MED ORDER — SODIUM CHLORIDE 0.9 % IV SOLN
INTRAVENOUS | Status: DC
  Administered 2016-11-13 – 2016-11-14 (×3): via INTRAVENOUS

## 2016-11-13 MED ORDER — PRENATAL MULTIVITAMIN CH
1.0000 | ORAL_TABLET | Freq: Every day | ORAL | Status: DC
  Administered 2016-11-14 – 2016-12-20 (×35): 1 via ORAL
  Filled 2016-11-13 (×37): qty 1

## 2016-11-13 MED ORDER — CLINDAMYCIN PHOSPHATE 900 MG/50ML IV SOLN
900.0000 mg | Freq: Three times a day (TID) | INTRAVENOUS | Status: DC
  Filled 2016-11-13: qty 50

## 2016-11-13 MED ORDER — GENTAMICIN SULFATE 40 MG/ML IJ SOLN
Freq: Three times a day (TID) | INTRAVENOUS | Status: AC
  Administered 2016-11-13 – 2016-11-15 (×7): via INTRAVENOUS
  Filled 2016-11-13 (×7): qty 3.5

## 2016-11-13 MED ORDER — MAGNESIUM SULFATE BOLUS VIA INFUSION
4.0000 g | Freq: Once | INTRAVENOUS | Status: AC
  Administered 2016-11-13: 4 g via INTRAVENOUS
  Filled 2016-11-13: qty 500

## 2016-11-13 NOTE — MAU Note (Signed)
Urine in lab 

## 2016-11-13 NOTE — Progress Notes (Signed)
ANTIBIOTIC CONSULT NOTE - INITIAL  Pharmacy Consult for Gentamicin Indication: Chorioamnionitis   Allergies  Allergen Reactions  . Penicillins Other (See Comments)    Results thru an allergy skin test Has patient had a PCN reaction causing immediate rash, facial/tongue/throat swelling, SOB or lightheadedness with hypotension: No Has patient had a PCN reaction causing severe rash involving mucus membranes or skin necrosis: No Has patient had a PCN reaction that required hospitalization No Has patient had a PCN reaction occurring within the last 10 years: No If all of the above answers are "NO", then may proceed with Cephalosporin use.      Patient Measurements:   Total Body Wt: 61.4 kg Dosing Wt: 61.4 kg  Vital Signs: Temp: 98.7 F (37.1 C) (01/24 1716) BP: 115/59 (01/24 1716) Pulse Rate: 106 (01/24 1716)  Labs: No results for input(s): WBC, HGB, PLT, LABCREA, CREATININE, CRCLEARANCE in the last 72 hours. No results for input(s): GENTTROUGH, GENTPEAK, GENTRANDOM in the last 72 hours.   Microbiology: No results found for this or any previous visit (from the past 720 hour(s)).  Medications:  Gent/clinda  Assessment: 21 y.o. female G3P1011 at 10872w4d  Estimated Ke = 0.365, Vd = 21.5 L  Goal of Therapy:  Gentamicin peak 6-8 mg/L and Trough < 1 mg/L  Plan:  Gentamicin 140 mg IV every 8 hrs  Check Scr with next labs if gentamicin continued. Will check gentamicin levels if continued > 72hr or clinically indicated.  Laurie [name], Laurie [name] 11/13/2016,6:58 PM

## 2016-11-13 NOTE — H&P (Signed)
ANTEPARTUM ADMISSION HISTORY AND PHYSICAL NOTE   History of Present Illness: Laurie [name] is a 21 y.o. G3P1011 at [redacted]w[redacted]d who presented to [facility]U for LOF.  States she has been leaking fluid for 2 days (at about 10pm on 1/22) then had gushes of bloody fluid that has been soaking pads today. She states she is not having regular contractions, but feeling some lower pelvic cramping or sharp pains occasionally in the pelvic region radiating to vagina. She has been having crampy back pain for a couple weeks as well, not changed.   +good FM. Denies urinary symptoms, HA, dizziness, N/V, F/C, abdominal pain.  Patient reports the fetal movement as active. Patient reports uterine contraction  activity as none. Patient reports  vaginal bleeding as blood tinged fluid. Patient describes fluid per vagina as Other Bloody watery. Fetal presentation is left transverse back down lie.  Patient Active Problem List   Diagnosis Date Noted  . Preterm premature rupture of membranes (PPROM) with unknown onset of labor 11/13/2016  . S/P cesarean section 01/12/2016  . Cholestasis of pregnancy in third trimester 01/10/2016  . Polysubstance abuse 08/31/2015    Past Medical History:  Diagnosis Date  . Anxiety   . Asthma    "grew out"  . Chlamydia   . Depression    doing really good  . Gestational diabetes   . Hepatitis C June 2016  . History of heroin abuse   . Infection    UTI  . Pregnant     Past Surgical History:  Procedure Laterality Date  . CESAREAN SECTION N/A 01/11/2016   Procedure: CESAREAN SECTION;  Surgeon: Brock Bad, MD;  Location: [facility];  Service: Obstetrics;  Laterality: N/A;  . NO PAST SURGERIES      OB History  Gravida Para Term Preterm AB Living  3 1 1  0 1 1  SAB TAB Ectopic Multiple Live Births  1 0 0 0 1    # Outcome Date GA Lbr Len/2nd Weight Sex Delivery Anes PTL Lv  3 Current           2 Term 01/11/16 [redacted]w[redacted]d  8 lb 15.9 oz (4.08 kg) F CS-LTranv EPI  LIV  1 SAB                Social History   Social History  . Marital status: Single    Spouse name: N/A  . Number of children: N/A  . Years of education: N/A   Social History Main Topics  . Smoking status: Current Some Day Smoker    Packs/day: 0.50    Types: Cigarettes  . Smokeless tobacco: Never Used     Comment: quit with + preg test  . Alcohol use No     Comment: Occas use.; none since mid Sept  . Drug use: Yes    Types: Marijuana, Heroin     Comment: positive Marijuana 5/28/17and last use of Heroin was July 2016.  Marland Kitchen Sexual activity: Yes    Birth control/ protection: None     Comment: last intercourse last month 06/2015   Other Topics Concern  . None   Social History Narrative  . None    Family History  Problem Relation Age of Onset  . Diabetes Mother   . Pulmonary embolism Mother   . Stroke Mother   . Cancer Paternal Grandmother     liver  . Epilepsy Brother     Allergies  Allergen Reactions  . Penicillins Other (See Comments)  Results thru an allergy skin test Has patient had a PCN reaction causing immediate rash, facial/tongue/throat swelling, SOB or lightheadedness with hypotension: No Has patient had a PCN reaction causing severe rash involving mucus membranes or skin necrosis: No Has patient had a PCN reaction that required hospitalization No Has patient had a PCN reaction occurring within the last 10 years: No If all of the above answers are "NO", then may proceed with Cephalosporin use.      No prescriptions prior to admission.    Review of Systems - Negative except per HPI  Vitals:  BP 115/59 (BP Location: Left Arm)   Pulse 106   Temp 98.7 F (37.1 C)   Resp 18   LMP 06/06/2016  Physical Examination: CONSTITUTIONAL: Well-developed, well-nourished female in no acute distress.  HENT:  Normocephalic, atraumatic, External right and left ear normal. Oropharynx is clear and moist EYES: Conjunctivae and EOM are normal. Pupils are equal, round, and reactive to  light. No scleral icterus.  NECK: Normal range of motion, supple, no masses SKIN: Skin is warm and dry. No rash noted. Not diaphoretic. No erythema. No pallor. NEUROLGIC: Alert and oriented to person, place, and time. Normal reflexes, muscle tone coordination. No cranial nerve deficit noted. PSYCHIATRIC: Normal mood and affect. Normal behavior. Normal judgment and thought content. CARDIOVASCULAR: Normal heart rate noted, regular rhythm RESPIRATORY: Effort and breath sounds normal, no problems with respiration noted ABDOMEN: Soft, nontender, nondistended, gravid. MUSCULOSKELETAL: Normal range of motion. No edema and no tenderness. 2+ distal pulses.  Cervix: Not evaluated.  Membranes: ruptured, blood-tinged Fetal Monitoring:Baseline: 140 bpm, mod var, +accels, no decels Tocometer: Rare contractions  Labs:  No results found for this or any previous visit (from the past 24 hour(s)).  Imaging Studies: US Ob Comp + 14 Wk  Result Date: 11/11/2016  SECOND TRIMESTER SONOGRAM Laurie [name] is in the office for  second trimester sonogram. She is a 21 y.o. year old G62P1011 with Estimated Date of Delivery: 02/08/17 by early ultrasound now at  [redacted]w[redacted]d weeks gestation. Thus far the pregnancy has been complicated by LPNC,polysubstance abuse.. GESTATION:SINGLETON PRESENTATION:breech FETAL ACTIVITY:         Heart rate         134         The fetus is active. AMNIOTIC FLUID:The amniotic fluid volume is  normal, SDP : 6.2 cm. PLACENTA LOCALIZATION anterior  GRADE 2 CERVIX:Measures 4 cm ADNEXA:The ovaries are normal. GESTATIONAL AGE AND  BIOMETRICS: Gestational criteria: Estimated Date of Delivery: 02/08/17 by early ultrasound now at [redacted]w[redacted]d Previous Scans:1 outside ultrasound          BIPARIETAL DIAMETER           6.65 cm         26+6 weeks HEAD CIRCUMFERENCE           24.69 cm         26+6 weeks ABDOMINAL CIRCUMFERENCE           22.47 cm         26+6 weeks FEMUR LENGTH           4.96 cm         26+5 weeks  AVERAGE EGA(BY THIS SCAN):  26+6 weeks                                                 ESTIMATED FETAL WEIGHT:       1014  grams, 46 % ANATOMICAL SURVEY                                                                            COMMENTS CEREBRAL VENTRICLES yes normal  CHOROID PLEXUS yes normal  CEREBELLUM yes normal  CISTERNA MAGNA yes normal  NUCHAL REGION yes normal  ORBITS yes normal  NASAL BONE yes normal  NOSE/LIP yes normal  FACIAL PROFILE yes normal  4 CHAMBERED HEART yes normal  OUTFLOW TRACTS yes normal  DIAPHRAGM yes normal  STOMACH yes normal  RENAL REGION yes normal  BLADDER yes normal  CORD INSERTION yes normal  3 VESSEL CORD yes normal  SPINE yes normal  ARMS/HANDS yes normal  LEGS/FEET yes normal  GENITALIA yes normal Female **pt doesn't want to know today**     SUSPECTED ABNORMALITIES:  no QUALITY OF SCAN: satisfactory TECHNICIAN COMMENTS: US 27+2 wks,breech,cx 4 cm,ant pl gr 2,normal ov's bilat,fhr 134 bpm,svp of fluid 6.2 cm,efw 1014 g 46%,anatomy complete,no obvious abnormalities seen,[name] 02/08/2017 by  Outside US A copy of this report including all images has been saved and backed up to a second source for retrieval if needed. All measures and details of the anatomical scan, placentation, fluid volume and pelvic anatomy are contained in that report. Amber Flora LippsJ Carl 11/11/2016 4:39 PM Clinical Impression and recommendations: I have reviewed the sonogram results above. Combined with the patient's current clinical course, below are my impressions and any appropriate recommendations for management based on the sonographic findings: 1. Anatomic survey WNL. Growth at 46 %ile. 2. [name] by this u/s" : Estimated Date of Delivery: 02/08/17 Tilda BurrowFERGUSON,JOHN V     Assessment and Plan: Patient Active Problem List   Diagnosis Date Noted  . Preterm premature rupture of membranes (PPROM) with unknown onset of labor 11/13/2016  . S/P cesarean section 01/12/2016  . Cholestasis of  pregnancy in third trimester 01/10/2016  . Polysubstance abuse 08/31/2015   Admit to Antenatal Routine antenatal care Latency antibiotics: Azithro, Clinda/Gent (due to PCN severe allergy) BMZ given, second dose in 24 hours Magnesium for CP prophylaxis x24 hours Continuous fetal monitoring and toco UDS GC/CT   Jen MowElizabeth Tykeria Wawrzyniak, DO OB Fellow Center for [facility], [facility]

## 2016-11-13 NOTE — MAU Note (Signed)
Pt presents to MAU with leaking of fluid. Pt states Monday she had a "gush of fluid", yesterday she began spotting and today she is having gushes of fluid with blood. Pt also reports "barely feeling baby move."

## 2016-11-14 ENCOUNTER — Encounter (HOSPITAL_COMMUNITY): Payer: Self-pay

## 2016-11-14 LAB — BASIC METABOLIC PANEL
Anion gap: 6 (ref 5–15)
BUN: 8 mg/dL (ref 6–20)
CO2: 20 mmol/L — ABNORMAL LOW (ref 22–32)
Calcium: 6.8 mg/dL — ABNORMAL LOW (ref 8.9–10.3)
Chloride: 107 mmol/L (ref 101–111)
Creatinine, Ser: 0.51 mg/dL (ref 0.44–1.00)
GFR calc Af Amer: >60 mL/min (ref >60)
GLUCOSE: 168 mg/dL — AB (ref 65–99)
Potassium: 4.8 mmol/L (ref 3.5–5.1)
Sodium: 133 mmol/L — ABNORMAL LOW (ref 135–145)

## 2016-11-14 LAB — CBC
HCT: 22.2 % — ABNORMAL LOW (ref 36.0–46.0)
Hemoglobin: 7.1 g/dL — ABNORMAL LOW (ref 12.0–15.0)
MCH: 23.1 pg — ABNORMAL LOW (ref 26.0–34.0)
MCHC: 32 g/dL (ref 30.0–36.0)
MCV: 72.3 fL — ABNORMAL LOW (ref 78.0–100.0)
Platelets: 159 10*3/uL (ref 150–400)
RBC: 3.07 MIL/uL — ABNORMAL LOW (ref 3.87–5.11)
RDW: 17.2 % — AB (ref 11.5–15.5)
WBC: 10.8 10*3/uL — ABNORMAL HIGH (ref 4.0–10.5)

## 2016-11-14 LAB — GC/CHLAMYDIA PROBE AMP (~~LOC~~) NOT AT ARMC
CHLAMYDIA, DNA PROBE: POSITIVE — AB
NEISSERIA GONORRHEA: NEGATIVE

## 2016-11-14 LAB — HEPATITIS B SURFACE ANTIGEN: HEP B S AG: NEGATIVE

## 2016-11-14 MED ORDER — NICOTINE 7 MG/24HR TD PT24
7.0000 mg | MEDICATED_PATCH | Freq: Every day | TRANSDERMAL | Status: DC
  Administered 2016-11-14 – 2016-11-18 (×5): 7 mg via TRANSDERMAL
  Filled 2016-11-14 (×39): qty 1

## 2016-11-14 MED ORDER — AZITHROMYCIN 250 MG PO TABS
1000.0000 mg | ORAL_TABLET | Freq: Once | ORAL | Status: AC
  Administered 2016-11-14: 1000 mg via ORAL
  Filled 2016-11-14: qty 4

## 2016-11-14 NOTE — Consult Note (Signed)
Neonatology Consult to Antenatal Patient:  I was asked by Dr. Alysia PennaErvin to see this patient in order to provide antenatal counseling due to SROM at 27 weeks.  Ms. Laurie BarsSelf was admitted yesterday at 6627 4/[redacted] weeks GA after SROM on 1/22. She is currently not having active labor. She has gotten 2 doses of Betamethasone and is on Azithromycin, Clindamycin, and Gentamicin (Pen allergic) due to unknown GBS status and known Chlamydia infection. Her medical history is remarkable for a history of heroin abuse (last used 04/2015), marijuana use, and cigarette smoking (1/2 ppd). She has Hepatitis C and a history of anxiety and depression. This is her second child, a female, and her previous child was born at 539 weeks by C-section. She has been informed that delivery will occur at 34 weeks or (likely) sooner if labor begins/infection develops/other complications. The baby is breech.  I spoke with the patient alone. We discussed the worst case of delivery in the next 1-2 days, including usual DR management, possible respiratory complications and need for support, IV access, feedings (mother desires breast feeding, which was encouraged), LOS, Mortality and Morbidity, and long term outcomes. She had several questions and showed good understanding of the situation. I offered a NICU tour to any interested family members and would be glad to come back if she has more questions later.  Thank you for asking me to see this patient.  Laurie Souhristie C. Jannel Lynne, MD Neonatologist  The total length of face-to-face or floor/unit time for this encounter was 40 minutes. Counseling and/or coordination of care was 30 minutes of the above.

## 2016-11-14 NOTE — Progress Notes (Signed)
ACULTY PRACTICE ANTEPARTUM COMPREHENSIVE PROGRESS NOTE  Laurie [name] is a 21 y.o. G3P1011 at 3729w5d  who is admitted for PROM.   Fetal presentation is breech. Length of Stay:  1  Days  Subjective: Pt without complaints this morning. Some spotting at times. Denies cramps or ut ctx. Reports + FM.   Vitals:  Blood pressure (!) 87/41, pulse 81, temperature 98.4 F (36.9 C), temperature source Oral, resp. rate 17, height 5\' 3"  (1.6 m), weight 135 lb (61.2 kg), last menstrual period 06/06/2016, SpO2 100 %, unknown if currently breastfeeding.   Physical Examination: Lungs clear Heart RRR Abd soft + BS gravid non tender Ext non tedner  Fetal Monitoring:  140-150's reassuring for gestation age, no ut ctx noted  Labs:  Results for orders placed or performed during the hospital encounter of 11/13/16 (from the past 24 hour(s))  Type and screen [facility]   Collection Time: 11/13/16  6:45 PM  Result Value Ref Range   ABO/RH(D) A NEG    Antibody Screen NEG    Sample Expiration 11/16/2016   Urine rapid drug screen (hosp performed)   Collection Time: 11/13/16  7:28 PM  Result Value Ref Range   Opiates NONE DETECTED NONE DETECTED   Cocaine NONE DETECTED NONE DETECTED   Benzodiazepines NONE DETECTED NONE DETECTED   Amphetamines NONE DETECTED NONE DETECTED   Tetrahydrocannabinol NONE DETECTED NONE DETECTED   Barbiturates NONE DETECTED NONE DETECTED  Basic metabolic panel   Collection Time: 11/14/16  5:50 AM  Result Value Ref Range   Sodium 133 (L) 135 - 145 mmol/L   Potassium 4.8 3.5 - 5.1 mmol/L   Chloride 107 101 - 111 mmol/L   CO2 20 (L) 22 - 32 mmol/L   Glucose, Bld 168 (H) 65 - 99 mg/dL   BUN 8 6 - 20 mg/dL   Creatinine, Ser 6.570.51 0.44 - 1.00 mg/dL   Calcium 6.8 (L) 8.9 - 10.3 mg/dL   GFR calc non Af Amer >60 >60 mL/min   GFR calc Af Amer >60 >60 mL/min   Anion gap 6 5 - 15  CBC   Collection Time: 11/14/16  5:51 AM  Result Value Ref Range   WBC 10.8 (H) 4.0  - 10.5 K/uL   RBC 3.07 (L) 3.87 - 5.11 MIL/uL   Hemoglobin 7.1 (L) 12.0 - 15.0 g/dL   HCT 84.622.2 (L) 96.236.0 - 95.246.0 %   MCV 72.3 (L) 78.0 - 100.0 fL   MCH 23.1 (L) 26.0 - 34.0 pg   MCHC 32.0 30.0 - 36.0 g/dL   RDW 84.117.2 (H) 32.411.5 - 40.115.5 %   Platelets 159 150 - 400 K/uL    Imaging Studies:    U/S on 11/13/16   Medications:  Scheduled . betamethasone acetate-betamethasone sodium phosphate  12 mg Intramuscular Q24H  . docusate sodium  100 mg Oral Daily  . gentamicin (GARAMYCIN) with clindamycin (CLEOCIN) IV   Intravenous Q8H  . prenatal multivitamin  1 tablet Oral Q1200   I have reviewed the patient's current medications.  ASSESSMENT: IUP 27 5/7 weeks PROM No prenatal care H/O drug abuse H/O GDM H/O C section  PLAN:  Antibiotic therapy IV x 49 hrs and the complete 7 days oral. BMZ for FLM second dose tonight. Magnesium x 24 hrs for Cp. Complete PNV labs. Hgb A 1 C  D/t H/O GDM. Glucola next week after BMZ affect has diminished. Neo consult. UDS on admission negative. Discussed PROM risks and management with pt.  Proceed to delivery for fetal and or maternal indications at present. C section at this time d/t fetal presentation. Presently no S/Sx of infection.  Laurie [name] 11/14/2016,12:21 PM

## 2016-11-14 NOTE — Progress Notes (Signed)
Inpatient Diabetes Program Recommendations  Diabetes Treatment Program Recommendations  ADA Standards of Care 2016 Diabetes in Pregnancy Target Glucose Ranges:  Fasting: 60 - 90 mg/dL Preprandial: 60 - 454105 mg/dL 1 hr postprandial: Less than 140mg /dL (from first bite of meal) 2 hr postprandial: Less than 120 mg/dL (from first bite of meal)    Results for Laurie BoozerSELF, [name] (MRN [id number]) as of 11/14/2016 09:11  Ref. Range 11/14/2016 05:50  Glucose Latest Ref Range: 65 - 99 mg/dL 829168 (H)    Review of Glycemic Control  Diabetes history: History of GDM during prior pregnancy Outpatient Diabetes medications: NA Current orders for Inpatient glycemic control: None  Inpatient Diabetes Program Recommendations: Correction (SSI): Patient has a history of GDM during previous pregnancy. Noted lab glucose 168 mg/dl today and that patient received steroid injection on 11/13/16 at 18:53. While inpatient, please consider ordering CBGs QID (fasting and 2 hour post prandial) and use Diabetic Pregnant Patient order set to order Novolog correction if needed.  Thanks, Orlando PennerMarie Calandria Mullings, RN, MSN, CDE Diabetes Coordinator Inpatient Diabetes Program [phone] (Team Pager from 8am to 5pm)

## 2016-11-15 LAB — URINE CULTURE
CULTURE: NO GROWTH
SPECIAL REQUESTS: NORMAL

## 2016-11-15 LAB — HIV ANTIBODY (ROUTINE TESTING W REFLEX): HIV SCREEN 4TH GENERATION: NONREACTIVE

## 2016-11-15 LAB — RPR: RPR: NONREACTIVE

## 2016-11-15 LAB — HEMOGLOBIN A1C
Hgb A1c MFr Bld: 4.8 % (ref 4.8–5.6)
Mean Plasma Glucose: 91 mg/dL

## 2016-11-15 LAB — RUBELLA SCREEN: Rubella: 0.97 index — ABNORMAL LOW (ref >0.99)

## 2016-11-15 MED ORDER — FERUMOXYTOL INJECTION 510 MG/17 ML
510.0000 mg | INTRAVENOUS | Status: AC
  Administered 2016-11-15 – 2016-11-22 (×2): 510 mg via INTRAVENOUS
  Filled 2016-11-15 (×2): qty 17

## 2016-11-15 MED ORDER — SODIUM CHLORIDE 0.9 % IV SOLN: 250.0000 mL | INTRAVENOUS | Status: DC | PRN

## 2016-11-15 MED ORDER — CLINDAMYCIN HCL 300 MG PO CAPS
300.0000 mg | ORAL_CAPSULE | Freq: Three times a day (TID) | ORAL | Status: AC
Start: 2016-11-16 — End: 2016-11-20
  Administered 2016-11-16 – 2016-11-20 (×15): 300 mg via ORAL
  Filled 2016-11-15 (×15): qty 1

## 2016-11-15 MED ORDER — PROMETHAZINE HCL 25 MG/ML IJ SOLN
12.5000 mg | Freq: Four times a day (QID) | INTRAMUSCULAR | Status: DC | PRN
Start: 2016-11-15 — End: 2016-11-15
  Filled 2016-11-15: qty 1

## 2016-11-15 MED ORDER — SODIUM CHLORIDE 0.9% FLUSH
3.0000 mL | Freq: Two times a day (BID) | INTRAVENOUS | Status: DC
  Administered 2016-11-15 – 2016-12-20 (×63): 3 mL via INTRAVENOUS

## 2016-11-15 MED ORDER — ONDANSETRON HCL 4 MG PO TABS
4.0000 mg | ORAL_TABLET | Freq: Four times a day (QID) | ORAL | Status: DC | PRN
Start: 2016-11-15 — End: 2016-12-21
  Administered 2016-11-15 – 2016-12-04 (×4): 4 mg via ORAL
  Filled 2016-11-15 (×4): qty 1

## 2016-11-15 MED ORDER — PROMETHAZINE HCL 25 MG PO TABS
25.0000 mg | ORAL_TABLET | Freq: Four times a day (QID) | ORAL | Status: DC | PRN
  Administered 2016-12-15 – 2016-12-18 (×3): 25 mg via ORAL
  Filled 2016-11-15 (×3): qty 1

## 2016-11-15 MED ORDER — SODIUM CHLORIDE 0.9% FLUSH
3.0000 mL | INTRAVENOUS | Status: DC | PRN
  Administered 2016-11-15 – 2016-12-10 (×2): 3 mL via INTRAVENOUS
  Filled 2016-11-15 (×2): qty 3

## 2016-11-15 NOTE — Progress Notes (Signed)
ACULTY PRACTICE ANTEPARTUM COMPREHENSIVE PROGRESS NOTE  Laurie [name] is a 21 y.o. G3P1011 at 3449w6d  who is admitted for PROM  Fetal presentation is breech. Length of Stay:  2  Days  Subjective: Pt without complaints this morning. Occ cramp. Good fetal movement.    Vitals:  Blood pressure (!) 104/52, pulse 83, temperature 98.6 F (37 C), temperature source Oral, resp. rate 16, height 5\' 3"  (1.6 m), weight 135 lb (61.2 kg), last menstrual period 06/06/2016, SpO2 99 %, unknown if currently breastfeeding.   Physical Examination: Lungs clear Heart RRR Abd soft + BS gravid non tender Ext non tender  Fetal Monitoring:  Baseline: 120-140's bpm  Labs:  No results found for this or any previous visit (from the past 24 hour(s)).  Imaging Studies:    N/A   Medications:  Scheduled . docusate sodium  100 mg Oral Daily  . gentamicin (GARAMYCIN) with clindamycin (CLEOCIN) IV   Intravenous Q8H  . nicotine  7 mg Transdermal Daily  . prenatal multivitamin  1 tablet Oral Q1200   I have reviewed the patient's current medications.  ASSESSMENT: IUP 27 6/7 wks PROM No prenatal care H/O drug abuse H/O GDM H/O C section + Chlyamdia   PLAN: Will complete IV antibiotics today and then switch to oral clindamycin x 5 days. S/P BMZ. No S/Sx of infection Continue with current management   Hermina StaggersMichael L Anoushka Divito 11/15/2016,7:25 AM

## 2016-11-16 MED ORDER — FAMOTIDINE 20 MG PO TABS
20.0000 mg | ORAL_TABLET | Freq: Two times a day (BID) | ORAL | Status: DC
  Administered 2016-11-16 – 2016-12-20 (×69): 20 mg via ORAL
  Filled 2016-11-16 (×69): qty 1

## 2016-11-16 NOTE — Progress Notes (Signed)
ACULTY PRACTICE ANTEPARTUM COMPREHENSIVE PROGRESS NOTE  Laurie [name] is a 21 y.o. G3P1011 at 6574w0d  who is admitted for PPROM  Fetal presentation is breech. Length of Stay:  3  Days  Subjective: Reports heartburn this morning. No contractions, LOF or bleeding. Good fetal movement.    Vitals:  Blood pressure (!) 86/39, pulse 99, temperature 98.7 F (37.1 C), temperature source Oral, resp. rate 17, height 5\' 3"  (1.6 m), weight 135 lb (61.2 kg), last menstrual period 06/06/2016, SpO2 99 %, unknown if currently breastfeeding.   Physical Examination: Lungs clear Heart RRR Abd soft + BS gravid non tender Ext non tender  Fetal Monitoring:  Baseline: 120-140's bpm, reassuring  Labs:  No results found for this or any previous visit (from the past 24 hour(s)).  Imaging Studies:    N/A   Medications:  Scheduled . clindamycin  300 mg Oral Q8H  . docusate sodium  100 mg Oral Daily  . famotidine  20 mg Oral BID  . ferumoxytol  510 mg Intravenous Weekly  . nicotine  7 mg Transdermal Daily  . prenatal multivitamin  1 tablet Oral Q1200  . sodium chloride flush  3 mL Intravenous Q12H   I have reviewed the patient's current medications.  ASSESSMENT: IUP 27 6/7 wks PROM No prenatal care H/O drug abuse H/O GDM H/O C section + Chlyamdia, treated   PLAN: On oral clindamycin x 5 days. S/P BMZ. No S/Sx of infection Pepcid ordered for heartburn No signs/symptoms of complications for now Reassuring fetal status. Continue with current management   Jaynie CollinsUgonna Praise Dolecki, MD 11/16/2016,9:42 AM

## 2016-11-17 DIAGNOSIS — O42919 Preterm premature rupture of membranes, unspecified as to length of time between rupture and onset of labor, unspecified trimester: Secondary | ICD-10-CM

## 2016-11-17 LAB — TYPE AND SCREEN
ABO/RH(D): A NEG
ANTIBODY SCREEN: NEGATIVE

## 2016-11-17 NOTE — Progress Notes (Signed)
Patient ID: Laurie [name], female   DOB: 07-02-1996, 21 y.o.   MRN: [id number]   FACULTY PRACTICE ANTEPARTUM COMPREHENSIVE PROGRESS NOTE  Laurie [name] is a 21 y.o. G3P1011 at 1270w1d  who is admitted for PPROM  Fetal presentation is breech. Length of Stay:  4  Days  Subjective: Patient is without complaints. She denies leakage of fluid, contractions or vaginal bleeding.    Vitals:  Blood pressure (!) 95/45, pulse 87, temperature 98.2 F (36.8 C), temperature source Oral, resp. rate 16, height 5\' 3"  (1.6 m), weight 135 lb (61.2 kg), last menstrual period 06/06/2016, SpO2 99 %, unknown if currently breastfeeding.   Physical Examination: Lungs clear Heart RRR Abd soft + BS gravid non tender Ext non tender  Fetal Monitoring:  Baseline: 160 bpm, Variability: Good {> 6 bpm), Accelerations: Reactive and Decelerations: Absent, reassuring Toco: no contractions  Labs:  No results found for this or any previous visit (from the past 24 hour(s)).  Imaging Studies:    N/A   Medications:  Scheduled . clindamycin  300 mg Oral Q8H  . docusate sodium  100 mg Oral Daily  . famotidine  20 mg Oral BID  . ferumoxytol  510 mg Intravenous Weekly  . nicotine  7 mg Transdermal Daily  . prenatal multivitamin  1 tablet Oral Q1200  . sodium chloride flush  3 mL Intravenous Q12H   I have reviewed the patient's current medications.  ASSESSMENT: IUP 27 6/7 wks PROM No prenatal care H/O drug abuse H/O GDM H/O C section + Chlyamdia, treated   PLAN: Continue latency antibiotics. S/P BMZ.  No S/Sx of chorioamnionitis Reassuring fetal status. Continue with current management   Catalina AntiguaPeggy Domenick Quebedeaux, MD 11/17/2016,7:29 AM

## 2016-11-17 NOTE — Progress Notes (Signed)
Patient requested to come off the monitor.

## 2016-11-17 NOTE — Progress Notes (Signed)
Writer was informed that patient was transported off the Unit VIA W/C by her female companion. Pt has W/C privileges but we will remind patient to inform RN whenever she is leaving the floor.

## 2016-11-17 NOTE — Progress Notes (Signed)
Patient had large gush of fluid getting up to the bathroom this morning.  She also had some bloody discharge.

## 2016-11-17 NOTE — Progress Notes (Signed)
Patient returned to Unit alert and felling well. Pt stated that she went to see her baby and promise to inform us the next time. Pt denies pain or discomfort and settled comfortable in bed.

## 2016-11-17 NOTE — Progress Notes (Signed)
Faculty Practice OB/GYN Attending Note   Subjective:  Called to evaluate patient with gush of bloody fluid ~100 ml. Patient is very concerned. FHR reassuring, no contractions. Good FM.   Admitted on 11/13/2016 for Preterm premature rupture of membranes (PPROM) with unknown onset of labor.    Objective:  Blood pressure (!) 95/45, pulse 87, temperature 98.4 F (36.9 C), temperature source Oral, resp. rate 18, height 5\' 3"  (1.6 m), weight 135 lb (61.2 kg), last menstrual period 06/06/2016, SpO2 99 %, unknown if currently breastfeeding. FHT  Baseline 140bpm, moderate variability, +accelerations, no decelerations Toco:  no contactions Gen: NAD HENT: Normocephalic, atraumatic Lungs: Normal respiratory effort Heart: Regular rate noted Abdomen: NT, gravid fundus, soft Cervix: No active bleeding. 1/50/ballotable/non-cephalic presentation Ext: 2+ DTRs, no edema, no cyanosis, negative Homan's sign  Assessment & Plan:  21 y.o. G3P1011 at 677w1d admitted for PPROM with episode of gush of blood fluid. No active bleeding. Reassuring fetal status, noncephalic presentation. No PTL for now.  Concerned about possible abruption, will continue close monitoring. NPO for now.  Continuous FHR monitoring for now.   Jaynie CollinsUGONNA  Mischa Pollard, MD, FACOG Attending Obstetrician & Gynecologist Faculty Practice, [facility]

## 2016-11-18 NOTE — Progress Notes (Signed)
Approached patient in an attempt to Monitor baby and give 1000 medications.  Patient asked nurse if "we could do this at another time".   No further.

## 2016-11-18 NOTE — Progress Notes (Signed)
ACULTY PRACTICE ANTEPARTUM COMPREHENSIVE PROGRESS NOTE  Laurie [name] is a 21 y.o. G3P1011 at 1574w2d  who is admitted for PPROM  Fetal presentation is breech. Length of Stay:  5  Days  Subjective: No further bleeding. No contractions, LOF. Good fetal movement.   Vitals:  Blood pressure (!) 110/47, pulse 92, temperature 98.5 F (36.9 C), temperature source Oral, resp. rate 16, height 5\' 3"  (1.6 m), weight 135 lb (61.2 kg), last menstrual period 06/06/2016, SpO2 98 %, unknown if currently breastfeeding.   Physical Examination: Gen NAD Abd soft + BS gravid non tender Ext non tender  Fetal Monitoring:  Baseline: 120-140's bpm, reassuring  Labs:  Results for orders placed or performed during the hospital encounter of 11/13/16 (from the past 24 hour(s))  Type and screen [facility]   Collection Time: 11/17/16  7:21 PM  Result Value Ref Range   ABO/RH(D) A NEG    Antibody Screen NEG    Sample Expiration 11/20/2016     Imaging Studies:    N/A   Medications:  Scheduled . clindamycin  300 mg Oral Q8H  . docusate sodium  100 mg Oral Daily  . famotidine  20 mg Oral BID  . ferumoxytol  510 mg Intravenous Weekly  . nicotine  7 mg Transdermal Daily  . prenatal multivitamin  1 tablet Oral Q1200  . sodium chloride flush  3 mL Intravenous Q12H   I have reviewed the patient's current medications.  ASSESSMENT: IUP in third trimester PROM No prenatal care H/O drug abuse H/O GDM H/O C section + Chlamydia, treated   PLAN: On oral clindamycin x 5 days. S/P BMZ. No s/sx of infection No signs/symptoms of complications for now Reassuring fetal status. Continue with current management   Jaynie CollinsUgonna Anyanwu, MD 11/18/2016,8:58 AM

## 2016-11-18 NOTE — Progress Notes (Signed)
Report given to Donzetta Sprungebbie Warren, RN

## 2016-11-19 ENCOUNTER — Inpatient Hospital Stay (HOSPITAL_COMMUNITY): Payer: 59

## 2016-11-19 ENCOUNTER — Encounter (HOSPITAL_COMMUNITY): Payer: Self-pay | Admitting: Obstetrics and Gynecology

## 2016-11-19 DIAGNOSIS — Z98891 History of uterine scar from previous surgery: Secondary | ICD-10-CM

## 2016-11-19 LAB — FOLATE: FOLATE: 15.4 ng/mL (ref >5.9)

## 2016-11-19 NOTE — Progress Notes (Signed)
ACULTY PRACTICE ANTEPARTUM COMPREHENSIVE PROGRESS NOTE  Laurie [name] is a [age] y.o. G3P1011 at 2347w2d  who is admitted for PPROM  Fetal presentation is breech. Length of Stay:  6  Days  Subjective: Some light pink bleeding last night. No contractions, LOF. Good fetal movement.   Vitals:  Blood pressure (!) 87/43, pulse 88, temperature 98.4 F (36.9 C), temperature source Oral, resp. rate 18, height 5\' 3"  (1.6 m), weight 135 lb (61.2 kg), last menstrual period 06/06/2016, SpO2 100 %, unknown if currently breastfeeding.   Physical Examination: Gen NAD Heart: regular rate, no murmur Lungs: clear to auscultation bilaterally, no wheezing.  Abd soft + BS gravid non tender Ext non tender  Fetal Monitoring: NSTx3  Baseline: 120-140's bpm, reassuring  Labs:  No results found for this or any previous visit (from the past 24 hour(s)).  Imaging Studies:    N/A   Medications:  Scheduled . clindamycin  300 mg Oral Q8H  . docusate sodium  100 mg Oral Daily  . famotidine  20 mg Oral BID  . ferumoxytol  510 mg Intravenous Weekly  . nicotine  7 mg Transdermal Daily  . prenatal multivitamin  1 tablet Oral Q1200  . sodium chloride flush  3 mL Intravenous Q12H   I have reviewed the patient's current medications.  ASSESSMENT: IUP in third trimester PROM No prenatal care H/O drug abuse H/O GDM H/O C section + Chlamydia, treated   PLAN: On oral clindamycin x 5 days. S/P BMZ. No s/sx of infection No signs/symptoms of complications for now Reassuring fetal status. Continue with current management   Levie HeritageJacob J Stinson, MD 11/19/2016,7:53 AM

## 2016-11-20 LAB — TYPE AND SCREEN
ABO/RH(D): A NEG
ANTIBODY SCREEN: NEGATIVE

## 2016-11-20 NOTE — Progress Notes (Signed)
1300- Patient informing staff at the front desk that she was going downstairs to visit with her child.

## 2016-11-20 NOTE — Progress Notes (Signed)
FACULTY PRACTICE ANTEPARTUM COMPREHENSIVE PROGRESS NOTE  Laurie [name] is a 21 y.o. G3P1011 at 438w2d  who is admitted for PPROM  Fetal presentation is breech on 1/31 Length of Stay: 8Days  Subjective: No s/s of infection, PTL or decreased FM.   Vitals:  Blood pressure (!) 95/59, pulse 91, temperature 98.5 F (36.9 C), temperature source Oral, resp. rate 16, height 5\' 3"  (1.6 m), weight 61.2 kg (135 lb), last menstrual period 06/06/2016, SpO2 100 %, unknown if currently breastfeeding.   Physical Examination: Gen NAD Heart: normal s1 and s2, no MRGs Lungs: clear to auscultation bilaterally, no wheezing.  Abd soft + BS gravid non tender Ext non tender  Fetal Monitoring: 140 baseline, +accels, no decel, mod variability, toco quiet x 7289m  Labs:  Results for orders placed or performed during the hospital encounter of 11/13/16 (from the past 24 hour(s))  Folate   Collection Time: 11/19/16 10:07 AM  Result Value Ref Range   Folate 15.4 >5.9 ng/mL    Imaging Studies:    N/A   Medications:  Scheduled . clindamycin  300 mg Oral Q8H  . docusate sodium  100 mg Oral Daily  . famotidine  20 mg Oral BID  . ferumoxytol  510 mg Intravenous Weekly  . nicotine  7 mg Transdermal Daily  . prenatal multivitamin  1 tablet Oral Q1200  . sodium chloride flush  3 mL Intravenous Q12H   I have reviewed the patient's current medications.  ASSESSMENT: Patient stable  PLAN: *Pregnancy: routine care. Do GDM screening later this week when further out from steroids (already scheduled). *PPROM: continue clinda for latency abx. S/p BMZ on 1/24 and 25 *Preterm: see above. S/p  *PPx: SCDs, OOB ad lib *no PNC, h/o polysubstance abuse: negative UDS on admission *Hep C:  *+CT: tx on admission. Needs TOC in 2-4wks *h/o c-section: repeat for delivery, given malpresentation.  *anemia: on qwk IV iron.  *FEN/GI:  *Dispo: standard indications, 34wks   [place] [name], MD 11/20/2016,6:51 AM

## 2016-11-20 NOTE — Progress Notes (Addendum)
Assumed care of patient. Pt not in room and not in unit.    2100: Pt back in room. EFM applied. Juice provided.  2218: pt declines SCDs.   2220: EFM removed.   0340: pt resting quietly with eyes closed.

## 2016-11-21 DIAGNOSIS — O42913 Preterm premature rupture of membranes, unspecified as to length of time between rupture and onset of labor, third trimester: Secondary | ICD-10-CM

## 2016-11-21 DIAGNOSIS — O321XX0 Maternal care for breech presentation, not applicable or unspecified: Secondary | ICD-10-CM

## 2016-11-21 DIAGNOSIS — Z3A28 28 weeks gestation of pregnancy: Secondary | ICD-10-CM

## 2016-11-21 NOTE — Progress Notes (Signed)
ACULTY PRACTICE ANTEPARTUM COMPREHENSIVE PROGRESS NOTE Late entry.   Laurie [name] is a 21 y.o. G3P1011 at [redacted]w[redacted]d  who is admitted for PPROM    Fetal presentation is breech. Length of Stay:  8  Days  Subjective: Pt without complaints. + Fm. Denies ut ctx.   Vitals:  Blood pressure (!) 105/52, pulse 97, temperature 98.4 F (36.9 C), temperature source Oral, resp. rate 18, height 5\' 3"  (1.6 m), weight 61.2 kg (135 lb), last menstrual period 06/06/2016, SpO2 100 %, unknown if currently breastfeeding.   Physical Examination: Lungs clear Heart RRR Abd soft gravid non tender   Fetal Monitoring:  Baseline: 140's bpm + accels, no ctx  Labs:  No results found for this or any previous visit (from the past 24 hour(s)).  Imaging Studies:    U/S 1104 gms, 35%, AFI 5, frank breech   Medications:  Scheduled . docusate sodium  100 mg Oral Daily  . famotidine  20 mg Oral BID  . ferumoxytol  510 mg Intravenous Weekly  . nicotine  7 mg Transdermal Daily  . prenatal multivitamin  1 tablet Oral Q1200  . sodium chloride flush  3 mL Intravenous Q12H   I have reviewed the patient's current medications.  ASSESSMENT: IUP 28 5/7 weeks PPROM s/p latency antibiotics and BMZ H/O drug abuse, UDS negative on admission Chlamydia, s/p treatment, TOC needed in 2-4 weeks Anemia, receiving iron  H/O c section, will need repeat d/t to breech  PLAN: Stable. Continue with present management. Glucola tomorrow. Delivery at 34 weeks or earlier as indicated by S/Sx infection, PTL or fetal indication  Hermina StaggersMichael L Johari Pinney 11/21/2016,10:46 PM

## 2016-11-22 DIAGNOSIS — O42913 Preterm premature rupture of membranes, unspecified as to length of time between rupture and onset of labor, third trimester: Secondary | ICD-10-CM

## 2016-11-22 DIAGNOSIS — Z3A28 28 weeks gestation of pregnancy: Secondary | ICD-10-CM

## 2016-11-22 DIAGNOSIS — O321XX0 Maternal care for breech presentation, not applicable or unspecified: Secondary | ICD-10-CM

## 2016-11-22 LAB — GLUCOSE, FASTING: GLUCOSE, FASTING: 94 mg/dL (ref 65–99)

## 2016-11-22 LAB — GLUCOSE, 2 HOUR: GLUCOSE, 2 HOUR: 184 mg/dL — AB (ref 70–139)

## 2016-11-22 MED ORDER — CYCLOBENZAPRINE HCL 5 MG PO TABS
5.0000 mg | ORAL_TABLET | Freq: Three times a day (TID) | ORAL | Status: DC | PRN
Start: 2016-11-22 — End: 2016-12-11
  Administered 2016-11-22 – 2016-12-02 (×6): 5 mg via ORAL
  Filled 2016-11-22 (×11): qty 1

## 2016-11-22 NOTE — Progress Notes (Signed)
ACULTY PRACTICE ANTEPARTUM COMPREHENSIVE PROGRESS NOTE  Laurie [name] is a 20 y.o. G3P1011 at 3728w6d  wh49o is admitted for PPROM.  Fetal presentation is breech. Length of Stay:  9  Days  Subjective: Pt without complaints this morning. Reports + fetal movement. Denies ut ctx.  Vitals:  Blood pressure (!) 102/40, pulse 95, temperature 98 F (36.7 C), temperature source Axillary, resp. rate 18, height 5\' 3"  (1.6 m), weight 61.2 kg (135 lb), last menstrual period 06/06/2016, SpO2 100 %, unknown if currently breastfeeding.   Physical Examination: Lungs clear  Heart RRR Abd soft + BS gravid non tender  Fetal Monitoring: 140's    Labs:  Results for orders placed or performed during the hospital encounter of 11/13/16 (from the past 24 hour(s))  Glucose, fasting   Collection Time: 11/22/16  5:44 AM  Result Value Ref Range   Glucose, Fasting 94 65 - 99 mg/dL    Imaging Studies:      Medications:  Scheduled . docusate sodium  100 mg Oral Daily  . famotidine  20 mg Oral BID  . ferumoxytol  510 mg Intravenous Weekly  . nicotine  7 mg Transdermal Daily  . prenatal multivitamin  1 tablet Oral Q1200  . sodium chloride flush  3 mL Intravenous Q12H   I have reviewed the patient's current medications.  ASSESSMENT: IUP 28 6/7 wks PPROM s/p antibiotics and BMZ, frank breech H/O drug abuse, UDS on admission negative Chlamydia, s/p treatment, will need TOC in 2-4 wks Anemia receiving iron H/O c section  PLAN:  Stable. No S/Sx of infection. Glucola today. Delivery at 34 weeks or earlier as per maternal or fetal indications  Hermina StaggersMichael L Ervin 11/22/2016,7:49 AM

## 2016-11-22 NOTE — Progress Notes (Signed)
Initial Nutrition Assessment  DOCUMENTATION CODES:  n/a   INTERVENTION:  Regular diet currently Diet order expected to change to CHO modified gestational diabetic diet Allow double protein portions Basic parameters of GDM diet reviewed with pt  NUTRITION DIAGNOSIS:  Increased nutrient needs related to  (pregnancy and fetal growth requirements) as evidenced by  (28 weeks IUP).  GOAL:  Patient will meet greater than or equal to 90% of their needs, Weight gain  MONITOR:  Weight trends  REASON FOR ASSESSMENT:  Antenatal   ASSESSMENT:  28 6/7 weeks PROM, pre-preg weight 120 lbs, BMI 21.3. 15 lb weight gain. GDM with prev preg. 2 hour GTT 94/184. Relayed GTT results to pt and told her I expected diet to change to CHO mod gestational diet. Copy of " My meal plan for gestational Diabetes" provided to pt as well as copy of GDM snack menu. Reviewed serum glucose goals  Diet Order:  Diet regular Room service appropriate? Yes; Fluid consistency: Thin  Skin:   reviewed and no issues  Height:   Ht Readings from Last 1 Encounters:  11/14/16 5\' 3"  (1.6 m)    Weight:   Wt Readings from Last 1 Encounters:  11/14/16 135 lb (61.2 kg)    Ideal Body Weight:   115 lbs  BMI:  Body mass index is 23.91 kg/m.  Estimated Nutritional Needs:   Kcal:  1700-1900  Protein:  75 - 85 g  Fluid:  2 L  EDUCATION NEEDS: addressed   Elisabeth CaraKatherine Lilianah Buffin M.Odis LusterEd. R.D. LDN Neonatal Nutrition Support Specialist/RD III Pager [phone]      Phone [phone]

## 2016-11-23 DIAGNOSIS — O42913 Preterm premature rupture of membranes, unspecified as to length of time between rupture and onset of labor, third trimester: Secondary | ICD-10-CM

## 2016-11-23 DIAGNOSIS — O321XX0 Maternal care for breech presentation, not applicable or unspecified: Secondary | ICD-10-CM

## 2016-11-23 DIAGNOSIS — Z3A28 28 weeks gestation of pregnancy: Secondary | ICD-10-CM

## 2016-11-23 LAB — TYPE AND SCREEN
ABO/RH(D): A NEG
ANTIBODY SCREEN: NEGATIVE

## 2016-11-23 NOTE — Progress Notes (Signed)
ACULTY PRACTICE ANTEPARTUM COMPREHENSIVE PROGRESS NOTE  Laurie [name] is a 21 y.o. G3P1011 at 10469w0d  who is admitted for PPROM.  Fetal presentation is breech. Length of Stay:  10  Days  Subjective: Pt without complaints this morning. Reports + fetal movement. Denies ut ctx.  Vitals:  Blood pressure (!) 90/43, pulse 83, temperature 98.8 F (37.1 C), temperature source Oral, resp. rate 18, height 5\' 3"  (1.6 m), weight 135 lb (61.2 kg), last menstrual period 06/06/2016, SpO2 100 %, unknown if currently breastfeeding.   Physical Examination: Lungs clear  Heart RRR Abd soft + BS gravid non tender  Fetal Monitoring: 140's    Labs:  No results found for this or any previous visit (from the past 24 hour(s)).  Imaging Studies:      Medications:  Scheduled . docusate sodium  100 mg Oral Daily  . famotidine  20 mg Oral BID  . nicotine  7 mg Transdermal Daily  . prenatal multivitamin  1 tablet Oral Q1200  . sodium chloride flush  3 mL Intravenous Q12H   I have reviewed the patient's current medications.  ASSESSMENT: IUP 7269w0d  PPROM s/p antibiotics and BMZ, frank breech H/O drug abuse, UDS on admission negative Chlamydia, s/p treatment, will need TOC in 2-4 wks Anemia receiving iron H/O c section  PLAN: No change in clinical status or treatment plan: Stable. No S/Sx of infection. Glucola today. Delivery at 34 weeks or earlier as per maternal or fetal indications  EURE,LUTHER H 11/23/2016,3:32 PM Patient ID: Laurie [name], female   DOB: October 19, 1996, 21 y.o.   MRN: [id number]

## 2016-11-23 NOTE — Progress Notes (Addendum)
Patient has differed vital signs, as well as EFM, until this point 1230.

## 2016-11-23 NOTE — Plan of Care (Signed)
Problem: Coping: Goal: Ability to identify and develop effective coping behavior will improve Outcome: Completed/Met Date Met: 11/23/16 Pt reported that she is "taking it one day at a time" and that she feels like she is on vacation.

## 2016-11-24 DIAGNOSIS — O321XX0 Maternal care for breech presentation, not applicable or unspecified: Secondary | ICD-10-CM

## 2016-11-24 DIAGNOSIS — O42913 Preterm premature rupture of membranes, unspecified as to length of time between rupture and onset of labor, third trimester: Secondary | ICD-10-CM

## 2016-11-24 DIAGNOSIS — Z3A28 28 weeks gestation of pregnancy: Secondary | ICD-10-CM

## 2016-11-24 NOTE — Progress Notes (Signed)
The resident MD was called and informed of patients' Vaginal bleed. MD  Instructed us to keep pt on NST monitoring and they will come to assess patient. Pt was informed of the Plan of Care and remained free of pain or uterine contraction. We will continue to monitor.

## 2016-11-24 NOTE — Progress Notes (Signed)
Baby placed to monitor, and MD notified of light pink, clear, small amount of fluid from patient.  No complaints of pain.

## 2016-11-24 NOTE — Progress Notes (Signed)
Patient has declined EFM earlier this date and wishes for monitoring at 1200, this nurse informed patient that if she any discomfort and or bleeding whatsoever that she would need to go on the monitor and the MD would be called, also strict wheelchair rides.

## 2016-11-24 NOTE — Progress Notes (Signed)
   11/24/16 0253  Vital Signs  BP (!) 115/50  BP Location Right Arm  Patient Position (if appropriate) Semi-fowlers  BP Method Automatic  Pulse Rate 85  Pulse Rate Source Monitor  Resp 18  Temp 98.5 F (36.9 C)  Temp Source Oral  Pt called out and reported having a vaginal bleed. Patient was instructed to remain in bed and FH Monitor and Toco applied. FHR 149

## 2016-11-24 NOTE — Progress Notes (Signed)
FACULTY PRACTICE ANTEPARTUM(COMPREHENSIVE) NOTE  Laurie [name] is a 21 y.o. G3P1011 at 7442w1d who is admitted for PROM, bleeding.    Length of Stay:  11  Days  Subjective:  Patient reports the fetal movement as active. Patient reports uterine contraction  activity as none. Patient reports  vaginal bleeding as scant staining. Patient describes fluid per vagina as Clear.  Vitals:  Blood pressure (!) 115/50, pulse 78, temperature 98.5 F (36.9 C), temperature source Oral, resp. rate 16, height 5\' 3"  (1.6 m), weight 61.2 kg (135 lb), last menstrual period 06/06/2016, SpO2 98 %, unknown if currently breastfeeding. Physical Examination:  General appearance - alert, well appearing, and in no distress Heart - normal rate and regular rhythm Abdomen - soft, nontender, nondistended Fundal Height:  size equals dates Cervical Exam: Not evaluated. Extremities: extremities normal, atraumatic, no cyanosis or edema and Homans sign is negative, no sign of DVT with DTRs 2+ bilaterally Membranes:ruptured  Fetal Monitoring:     Fetal Heart Rate A  Mode External filed at 11/24/2016 0500  Baseline Rate (A) 135 bpm filed at 11/24/2016 0500  Variability 6-25 BPM filed at 11/24/2016 0500  Accelerations 15 x 15 filed at 11/24/2016 0500  Decelerations Variable filed at 11/24/2016 0500     Labs:  Results for orders placed or performed during the hospital encounter of 11/13/16 (from the past 24 hour(s))  Type and screen [facility] [place]   Collection Time: 11/23/16  6:42 PM  Result Value Ref Range   ABO/RH(D) A NEG    Antibody Screen NEG    Sample Expiration 11/26/2016     Imaging Studies:      Medications:  Scheduled . docusate sodium  100 mg Oral Daily  . famotidine  20 mg Oral BID  . nicotine  7 mg Transdermal Daily  . prenatal multivitamin  1 tablet Oral Q1200  . sodium chloride flush  3 mL Intravenous Q12H   I have reviewed the patient's current  medications.  ASSESSMENT: Patient Active Problem List   Diagnosis Date Noted  . Preterm premature rupture of membranes (PPROM) with unknown onset of labor 11/13/2016  . S/P cesarean section 01/12/2016  . Polysubstance abuse 08/31/2015    PLAN: Watch for increased bleeding or s/sx PTL  Scheryl DarterJames Keylen Uzelac 11/24/2016,5:48 AM

## 2016-11-24 NOTE — Progress Notes (Signed)
Patient returned to the Unit smelling of smoke. Pt stated that she smoked a cigaret because she  "could not resist the urge" to smoke. We provided patient education and pt verbalized understanding of same.

## 2016-11-25 ENCOUNTER — Encounter: Payer: 59 | Admitting: Women's Health

## 2016-11-25 ENCOUNTER — Encounter: Payer: 59 | Admitting: Adult Health

## 2016-11-25 DIAGNOSIS — O321XX0 Maternal care for breech presentation, not applicable or unspecified: Secondary | ICD-10-CM

## 2016-11-25 DIAGNOSIS — O26899 Other specified pregnancy related conditions, unspecified trimester: Secondary | ICD-10-CM

## 2016-11-25 DIAGNOSIS — Z720 Tobacco use: Secondary | ICD-10-CM | POA: Diagnosis present

## 2016-11-25 DIAGNOSIS — Z3A29 29 weeks gestation of pregnancy: Secondary | ICD-10-CM

## 2016-11-25 DIAGNOSIS — Z6791 Unspecified blood type, Rh negative: Secondary | ICD-10-CM

## 2016-11-25 NOTE — Progress Notes (Signed)
Patient ID: Laurie [name], female   DOB: May 22, 1996, 22 y.o.   MRN: [id number] ACULTY PRACTICE ANTEPARTUM COMPREHENSIVE PROGRESS NOTE  Laurie [name] is a 21 y.o. G3P1011 at 7898w2d  who is admitted for PROM.   Fetal presentation is breech. Length of Stay:  12  Days  Subjective: Pt denies bleeding, she does reports continued LOF, she reports minimal cramping but, no ctx.  Vitals:  Blood pressure (!) 100/48, pulse 95, temperature 98.6 F (37 C), temperature source Oral, resp. rate 16, height 5\' 3"  (1.6 m), weight 135 lb (61.2 kg), last menstrual period 06/06/2016, SpO2 94 %, unknown if currently breastfeeding. Physical Examination: General appearance - alert, well appearing, and in no distress Abdomen - soft, nontender, nondistended, no masses or organomegaly gravod Extremities - peripheral pulses normal, no pedal edema, no clubbing or cyanosis Cervical Exam: Not evaluated. Membranes:ruptured  Fetal Monitoring:  Baseline: 140's bpm, Variability: Good {> 6 bpm), Accelerations: Non-reactive but appropriate for gestational age, Decelerations: Absent and TOCO: no ctx  Labs:  No results found for this or any previous visit (from the past 24 hour(s)).  Imaging Studies:    None pendng.  AFI prev ~6   Medications:  Scheduled . docusate sodium  100 mg Oral Daily  . famotidine  20 mg Oral BID  . nicotine  7 mg Transdermal Daily  . prenatal multivitamin  1 tablet Oral Q1200  . sodium chloride flush  3 mL Intravenous Q12H   I have reviewed the patient's current medications.  ASSESSMENT: Patient Active Problem List   Diagnosis Date Noted  . Preterm premature rupture of membranes (PPROM) with unknown onset of labor 11/13/2016  . S/P cesarean section 01/12/2016  . Polysubstance abuse 08/31/2015  Rh neg  PLAN: Pt s/p BMZ S/p latency atbx Deliver at 34 weeks or sooner prn maternal or fetal indications. Recheck fetal presentation at the time of delviery Social work consult for poly  substance abuse Watch for s/sx if infxn    Continue routine antenatal care.   Rishith Siddoway Harraway-Smith 11/25/2016,10:21 AM

## 2016-11-25 NOTE — Progress Notes (Signed)
Pt called to the desk for assistance. She had a small "gush" of light red bleeding with cramping. Placed on EFM & TOCO  - initial fetal HR 145.

## 2016-11-26 LAB — TYPE AND SCREEN
ABO/RH(D): A NEG
Antibody Screen: NEGATIVE

## 2016-11-26 LAB — COMPREHENSIVE METABOLIC PANEL
ALT: 31 U/L (ref 14–54)
AST: 28 U/L (ref 15–41)
Albumin: 2.8 g/dL — ABNORMAL LOW (ref 3.5–5.0)
Alkaline Phosphatase: 93 U/L (ref 38–126)
Anion gap: 6 (ref 5–15)
BUN: 15 mg/dL (ref 6–20)
CHLORIDE: 106 mmol/L (ref 101–111)
CO2: 23 mmol/L (ref 22–32)
CREATININE: 0.55 mg/dL (ref 0.44–1.00)
Calcium: 8.5 mg/dL — ABNORMAL LOW (ref 8.9–10.3)
GFR calc Af Amer: >60 mL/min (ref >60)
GFR calc non Af Amer: >60 mL/min (ref >60)
Glucose, Bld: 130 mg/dL — ABNORMAL HIGH (ref 65–99)
Potassium: 4.1 mmol/L (ref 3.5–5.1)
SODIUM: 135 mmol/L (ref 135–145)
Total Bilirubin: 0.6 mg/dL (ref 0.3–1.2)
Total Protein: 6.3 g/dL — ABNORMAL LOW (ref 6.5–8.1)

## 2016-11-26 NOTE — Progress Notes (Signed)
Patient ID: Laurie [name], female   DOB: 1996-01-21, 21 y.o.   MRN: [id number] ACULTY PRACTICE ANTEPARTUM COMPREHENSIVE PROGRESS NOTE  Laurie [name] is a 21 y.o. G3P1011 at 5421w3d  who is admitted for PROM.   Fetal presentation is breech. Length of Stay:  13  Days  Subjective: Pt with no new complaints Patient reports good fetal movement.  She reports no uterine contractions, no bleeding and no loss of fluid per vagina overnight.  Vitals:  Blood pressure (!) 84/41, pulse 71, temperature 98 F (36.7 C), temperature source Oral, resp. rate 14, height 5\' 3"  (1.6 m), weight 135 lb (61.2 kg), last menstrual period 06/06/2016, SpO2 98 %, unknown if currently breastfeeding. Physical Examination: General appearance - alert, well appearing, and in no distress Abdomen - soft, nontender, nondistended, no masses or organomegaly gravid Cervical Exam: Not evaluated. . Extremities: extremities normal, atraumatic, no cyanosis or edema  Membranes:ruptured  Fetal Monitoring:  Baseline: 140's-150's bpm, Variability: Good {> 6 bpm), Accelerations: Reactive, Decelerations: Absent and TOCO: no ctx  Labs:  No results found for this or any previous visit (from the past 24 hour(s)).  Imaging Studies:    None pending    Medications:  Scheduled . docusate sodium  100 mg Oral Daily  . famotidine  20 mg Oral BID  . nicotine  7 mg Transdermal Daily  . prenatal multivitamin  1 tablet Oral Q1200  . sodium chloride flush  3 mL Intravenous Q12H   I have reviewed the patient's current medications.  ASSESSMENT: Patient Active Problem List   Diagnosis Date Noted  . Tobacco abuse 11/25/2016  . Rh negative state in antepartum period 11/25/2016  . Preterm premature rupture of membranes (PPROM) with unknown onset of labor 11/13/2016  . S/P cesarean section 01/12/2016  . Polysubstance abuse 08/31/2015    PLAN: Pt s/p BMZ S/p latency atbx Deliver at 34 weeks or sooner prn maternal or fetal indications. Recheck  fetal presentation at the time of delviery Social work consult for poly substance abuse Watch for s/sx if infxn    Continue routine antenatal care.   Laurie [name] 11/26/2016,7:02 AM

## 2016-11-26 NOTE — Progress Notes (Signed)
Laurie [name] was in good spirits.  She stated that the hardest part about being here is missing her 4310 month old, but they are finding ways to stay connected.  She has good support from her mother while she is going through this as well as from her boyfriend, though he is in [place] because he is in the [employer].  She is coping okay with being in the hospital and knows that it is only a few more weeks.  She is aware of our ongoing support.  We will plan to follow up with her, but please also page as needs arise.  Chaplain Dyanne CarrelKaty Aylla Huffine, Bcc  Pager, [phone] 4:07 PM    11/26/16 1600  Clinical Encounter Type  Visited With Patient  Visit Type Spiritual support  Referral From Nurse

## 2016-11-27 LAB — CBC
HCT: 26.4 % — ABNORMAL LOW (ref 36.0–46.0)
Hemoglobin: 8.4 g/dL — ABNORMAL LOW (ref 12.0–15.0)
MCH: 25.2 pg — AB (ref 26.0–34.0)
MCHC: 31.8 g/dL (ref 30.0–36.0)
MCV: 79.3 fL (ref 78.0–100.0)
PLATELETS: 184 10*3/uL (ref 150–400)
RBC: 3.33 MIL/uL — AB (ref 3.87–5.11)
RDW: 27 % — ABNORMAL HIGH (ref 11.5–15.5)
WBC: 8.1 10*3/uL (ref 4.0–10.5)

## 2016-11-27 LAB — BILE ACIDS, TOTAL: Bile Acids Total: 10 umol/L (ref 4.7–24.5)

## 2016-11-27 MED ORDER — FERROUS SULFATE 325 (65 FE) MG PO TABS
325.0000 mg | ORAL_TABLET | Freq: Two times a day (BID) | ORAL | Status: DC
  Administered 2016-11-27 – 2016-12-20 (×45): 325 mg via ORAL
  Filled 2016-11-27 (×48): qty 1

## 2016-11-27 NOTE — Progress Notes (Signed)
Patient ID: Laurie [name], female   DOB: 06-Jun-1996, 21 y.o.   MRN: [id number] ACULTY PRACTICE ANTEPARTUM COMPREHENSIVE PROGRESS NOTE  Laurie [name] is a 21 y.o. G3P1011 at 395w4d  who is admitted for PROM.   Fetal presentation is breech. Length of Stay:  14  Days  Subjective: Denies any complaints.  Patient reports good fetal movement.  She reports no uterine contractions, no bleeding and no loss of fluid per vagina overnight.  Vitals:  Blood pressure (!) 108/55, pulse 98, temperature 99.1 F (37.3 C), resp. rate 19, height 5\' 3"  (1.6 m), weight 135 lb (61.2 kg), last menstrual period 06/06/2016, SpO2 100 %, unknown if currently breastfeeding. Physical Examination: General appearance - alert, well appearing, and in no distress Abdomen - soft, nontender, nondistended, no masses or organomegaly gravid Cervical Exam: Not evaluated. . Extremities: extremities normal, atraumatic, no cyanosis or edema  Membranes:ruptured  Fetal Monitoring:  Baseline: 150 bpm, Variability: Moderate {> 6 bpm), Accelerations: Reactive, Decelerations: Absent and TOCO: no ctx  Labs:  Results for orders placed or performed during the hospital encounter of 11/13/16 (from the past 24 hour(s))  Comprehensive metabolic panel   Collection Time: 11/26/16  2:09 PM  Result Value Ref Range   Sodium 135 135 - 145 mmol/L   Potassium 4.1 3.5 - 5.1 mmol/L   Chloride 106 101 - 111 mmol/L   CO2 23 22 - 32 mmol/L   Glucose, Bld 130 (H) 65 - 99 mg/dL   BUN 15 6 - 20 mg/dL   Creatinine, Ser 4.090.55 0.44 - 1.00 mg/dL   Calcium 8.5 (L) 8.9 - 10.3 mg/dL   Total Protein 6.3 (L) 6.5 - 8.1 g/dL   Albumin 2.8 (L) 3.5 - 5.0 g/dL   AST 28 15 - 41 U/L   ALT 31 14 - 54 U/L   Alkaline Phosphatase 93 38 - 126 U/L   Total Bilirubin 0.6 0.3 - 1.2 mg/dL   GFR calc non Af Amer >60 >60 mL/min   GFR calc Af Amer >60 >60 mL/min   Anion gap 6 5 - 15  Type and screen [facility] [place]   Collection Time: 11/26/16  5:31 PM  Result  Value Ref Range   ABO/RH(D) A NEG    Antibody Screen NEG    Sample Expiration 11/29/2016   CBC   Collection Time: 11/27/16  7:32 AM  Result Value Ref Range   WBC 8.1 4.0 - 10.5 K/uL   RBC 3.33 (L) 3.87 - 5.11 MIL/uL   Hemoglobin 8.4 (L) 12.0 - 15.0 g/dL   HCT 81.126.4 (L) 91.436.0 - 78.246.0 %   MCV 79.3 78.0 - 100.0 fL   MCH 25.2 (L) 26.0 - 34.0 pg   MCHC 31.8 30.0 - 36.0 g/dL   RDW 95.627.0 (H) 21.311.5 - 08.615.5 %   Platelets 184 150 - 400 K/uL    Imaging Studies:    None pending    Medications:  Scheduled . docusate sodium  100 mg Oral Daily  . famotidine  20 mg Oral BID  . ferrous sulfate  325 mg Oral BID WC  . nicotine  7 mg Transdermal Daily  . prenatal multivitamin  1 tablet Oral Q1200  . sodium chloride flush  3 mL Intravenous Q12H   I have reviewed the patient's current medications.  ASSESSMENT: Patient Active Problem List   Diagnosis Date Noted  . Tobacco abuse 11/25/2016  . Rh negative state in antepartum period 11/25/2016  . Preterm premature rupture of  membranes (PPROM) with unknown onset of labor 11/13/2016  . S/P cesarean section 01/12/2016  . Polysubstance abuse 08/31/2015    PLAN: Pt s/p BMZ and  latency atbx Deliver at 34 weeks or sooner prn maternal or fetal indications. Recheck fetal presentation at the time of delviery Social work consult done for poly substance abuse Watch for s/sx if infxn    Continue routine antenatal care.   Tereso Newcomer, MD 11/27/2016,9:28 AM

## 2016-11-28 DIAGNOSIS — O24419 Gestational diabetes mellitus in pregnancy, unspecified control: Secondary | ICD-10-CM | POA: Diagnosis not present

## 2016-11-28 LAB — GLUCOSE, CAPILLARY
Glucose-Capillary: 76 mg/dL (ref 65–99)
Glucose-Capillary: 88 mg/dL (ref 65–99)

## 2016-11-28 NOTE — Progress Notes (Signed)
Patient ID: Laurie [name], female   DOB: December 20, 1995, 21 y.o.   MRN: [id number] ACULTY PRACTICE ANTEPARTUM COMPREHENSIVE PROGRESS NOTE  Laurie [name] is a 21 y.o. G3P1011 at 652w5d  who is admitted for PROM.   Fetal presentation is breech. Length of Stay:  15  Days  Subjective: Denies any complaints.  No sentinel events.  Patient reports good fetal movement.  She reports no uterine contractions, no bleeding and no loss of fluid per vagina overnight.  Vitals:  Blood pressure (!) 105/40, pulse 93, temperature 99 F (37.2 C), temperature source Oral, resp. rate 16, height 5\' 3"  (1.6 m), weight 135 lb (61.2 kg), last menstrual period 06/06/2016, SpO2 100 %, unknown if currently breastfeeding. Physical Examination: General appearance - alert, well appearing, and in no distress Abdomen - soft, nontender, nondistended, no masses or organomegaly gravid Cervical Exam: Not evaluated. . Extremities: extremities normal, atraumatic, no cyanosis or edema  Membranes:ruptured  Fetal Monitoring:  Baseline: 140 bpm, Variability: Moderate {> 6 bpm), Accelerations: Reactive, Decelerations: Absent and TOCO: no ctx  Labs:  Results for orders placed or performed during the hospital encounter of 11/13/16 (from the past 24 hour(s))  CBC   Collection Time: 11/27/16  7:32 AM  Result Value Ref Range   WBC 8.1 4.0 - 10.5 K/uL   RBC 3.33 (L) 3.87 - 5.11 MIL/uL   Hemoglobin 8.4 (L) 12.0 - 15.0 g/dL   HCT 30.826.4 (L) 65.736.0 - 84.646.0 %   MCV 79.3 78.0 - 100.0 fL   MCH 25.2 (L) 26.0 - 34.0 pg   MCHC 31.8 30.0 - 36.0 g/dL   RDW 96.227.0 (H) 95.211.5 - 84.115.5 %   Platelets 184 150 - 400 K/uL    Imaging Studies:    None pending    Medications:  Scheduled . docusate sodium  100 mg Oral Daily  . famotidine  20 mg Oral BID  . ferrous sulfate  325 mg Oral BID WC  . nicotine  7 mg Transdermal Daily  . prenatal multivitamin  1 tablet Oral Q1200  . sodium chloride flush  3 mL Intravenous Q12H   I have reviewed the patient's  current medications.  ASSESSMENT: Patient Active Problem List   Diagnosis Date Noted  . Tobacco abuse 11/25/2016  . Rh negative state in antepartum period 11/25/2016  . Preterm premature rupture of membranes (PPROM) with unknown onset of labor 11/13/2016  . S/P cesarean section 01/12/2016  . Polysubstance abuse 08/31/2015    PLAN: Pt s/p BMZ and  latency antibiotics Deliver at 34 weeks or sooner prn maternal or fetal indications. Recheck fetal presentation at the time of delviery Social work consult already done for poly substance abuse Watch for signs/symptoms of infection    Continue routine antenatal care.   Tereso NewcomerUgonna A Anyanwu, MD 11/28/2016,7:22 AM

## 2016-11-28 NOTE — Progress Notes (Signed)
Baby placed to monitor b/c pt had scant amount of dark red blood on pad and a stringy clot, less than the size of a dime. No complaints of pain at this time.

## 2016-11-28 NOTE — Progress Notes (Signed)
Patient has a child in CPS custody.  CS[facility]C. Johnson.
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
## 2016-11-29 LAB — GLUCOSE, CAPILLARY
GLUCOSE-CAPILLARY: 116 mg/dL — AB (ref 65–99)
GLUCOSE-CAPILLARY: 129 mg/dL — AB (ref 65–99)
GLUCOSE-CAPILLARY: 173 mg/dL — AB (ref 65–99)
Glucose-Capillary: 73 mg/dL (ref 65–99)

## 2016-11-29 LAB — TYPE AND SCREEN
ABO/RH(D): A NEG
Antibody Screen: NEGATIVE

## 2016-11-29 MED ORDER — BETAMETHASONE SOD PHOS & ACET 6 (3-3) MG/ML IJ SUSP
12.0000 mg | INTRAMUSCULAR | Status: AC
  Administered 2016-11-29 – 2016-11-30 (×2): 12 mg via INTRAMUSCULAR
  Filled 2016-11-29 (×2): qty 2

## 2016-11-29 NOTE — Progress Notes (Signed)
FACULTY PRACTICE ANTEPARTUM COMPREHENSIVE PROGRESS NOTE  Thornton DalesSkylar E Thaden is a 21 y.o. G3P1011 at 2370w2d  who is admitted for PPROM  Fetal presentation is breech on 1/31 Length of Stay: HD#17  Subjective: No s/s of infection, PTL or decreased FM.   Vitals:  Blood pressure (!) 101/42, pulse 95, temperature 99.3 F (37.4 C), temperature source Oral, resp. rate 18, height 5\' 3"  (1.6 m), weight 61.2 kg (135 lb), last menstrual period 06/06/2016, SpO2 100 %, unknown if currently breastfeeding.   Physical Examination: Gen NAD Lungs: no resp distress Abd soft, gravid, non tender Ext non tender, no c/c/e  Fetal Monitoring: 140 baseline, +accels, no decel, mod variability, toco quiet   Labs:  Results for orders placed or performed during the hospital encounter of 11/13/16 (from the past 24 hour(s))  Glucose, capillary   Collection Time: 11/28/16 12:08 PM  Result Value Ref Range   Glucose-Capillary 76 65 - 99 mg/dL  Glucose, capillary   Collection Time: 11/28/16  3:19 PM  Result Value Ref Range   Glucose-Capillary 88 65 - 99 mg/dL  Glucose, capillary   Collection Time: 11/29/16 12:51 AM  Result Value Ref Range   Glucose-Capillary 116 (H) 65 - 99 mg/dL    Imaging Studies:    N/A   Medications:  Scheduled . docusate sodium  100 mg Oral Daily  . famotidine  20 mg Oral BID  . ferrous sulfate  325 mg Oral BID WC  . nicotine  7 mg Transdermal Daily  . prenatal multivitamin  1 tablet Oral Q1200  . sodium chloride flush  3 mL Intravenous Q12H   I have reviewed the patient's current medications.  ASSESSMENT: Patient stable  PLAN: *Pregnancy: routine care.  *PPROM: will order rescue BMZ today and tomorrow -s/p latency abx.  -S/p BMZ on 1/24 and 25 *Preterm: see above. S/p  *GDMa1: continue with qid checks.  *PPx: SCDs, OOB ad lib *no PNC, h/o polysubstance abuse: negative UDS on admission. SW following *Hep C: normal CMP on 2/6 *+CT: tx on admission. Needs TOC in mid  february *h/o c-section: repeat for delivery, given malpresentation.  *anemia: s/p IV iron. 2/7 H/H 8.4 and 26.4 from 7.1 and 22.2 continue with PO iron *FEN/GI: GDM diet *Dispo: standard indications, 34wks   [place] Bingharlie Venita Seng, MD 11/29/2016,6:41 AM

## 2016-11-30 LAB — GLUCOSE, CAPILLARY
GLUCOSE-CAPILLARY: 117 mg/dL — AB (ref 65–99)
Glucose-Capillary: 154 mg/dL — ABNORMAL HIGH (ref 65–99)
Glucose-Capillary: 199 mg/dL — ABNORMAL HIGH (ref 65–99)

## 2016-11-30 MED ORDER — RHO D IMMUNE GLOBULIN 1500 UNIT/2ML IJ SOSY
300.0000 ug | PREFILLED_SYRINGE | Freq: Once | INTRAMUSCULAR | Status: AC
  Administered 2016-11-30: 300 ug via INTRAMUSCULAR
  Filled 2016-11-30: qty 2

## 2016-11-30 NOTE — Progress Notes (Addendum)
FACULTY PRACTICE ANTEPARTUM(COMPREHENSIVE) NOTE  Laurie [name] is a 21 y.o. G3P1011 at 1476w0d  who is admitted for PPROM.   Fetal presentation is breech. Has failed GTT , but is now on second dose of steroids, so some elevation is expected. Length of Stay:  17  Days  Subjective: No complaints Patient reports the fetal movement as active. Patient reports uterine contraction  activity as none. Patient reports  vaginal bleeding as spotting. Patient describes fluid per vagina as Clear. She did pass an old clot earlier this week Vitals:  Blood pressure (!) 105/51, pulse 96, temperature 98.2 F (36.8 C), temperature source Oral, resp. rate 18, height 5\' 3"  (1.6 m), weight 61.2 kg (135 lb), last menstrual period 06/06/2016, SpO2 100 %, unknown if currently breastfeeding. Physical Examination:  General appearance - alert, well appearing, and in no distress, oriented to person, place, and time and normal appearing weight Heart - normal rate and regular rhythm Abdomen - soft, nontender, nondistended Fundal Height:  size less than dates and consistent with pprom Cervical Exam: Not evaluated. An Extremities: extremities normal, atraumatic, no cyanosis or edema and Homans sign is negative, no sign of DVT with DTRs 2+ bilaterally pt reminded to wear scd's Membranes:ruptured  Fetal Monitoring:  reactive  Labs:  Results for orders placed or performed during the hospital encounter of 11/13/16 (from the past 24 hour(s))  Glucose, capillary   Collection Time: 11/29/16  7:58 AM  Result Value Ref Range   Glucose-Capillary 73 65 - 99 mg/dL  Glucose, capillary   Collection Time: 11/29/16  3:32 PM  Result Value Ref Range   Glucose-Capillary 129 (H) 65 - 99 mg/dL  Type and screen [facility]   Collection Time: 11/29/16  6:55 PM  Result Value Ref Range   ABO/RH(D) A NEG    Antibody Screen NEG    Sample Expiration 12/02/2016   Glucose, capillary   Collection Time: 11/29/16 10:00 PM   Result Value Ref Range   Glucose-Capillary 173 (H) 65 - 99 mg/dL    Imaging Studies:     Currently EPIC will not allow sonographic studies to automatically populate into notes.  In the meantime, copy and paste results into note or free text.  Medications:  Scheduled . betamethasone acetate-betamethasone sodium phosphate  12 mg Intramuscular Q24 Hr x 2  . docusate sodium  100 mg Oral Daily  . famotidine  20 mg Oral BID  . ferrous sulfate  325 mg Oral BID WC  . nicotine  7 mg Transdermal Daily  . prenatal multivitamin  1 tablet Oral Q1200  . sodium chloride flush  3 mL Intravenous Q12H     ASSESSMENT: Patient Active Problem List   Diagnosis Date Noted  . GDM (gestational diabetes mellitus) 11/28/2016  . Tobacco abuse 11/25/2016  . Rh negative state in antepartum period 11/25/2016  . Preterm premature rupture of membranes (PPROM) with unknown onset of labor 11/13/2016  . History of cesarean delivery 01/12/2016  . Polysubstance abuse 08/31/2015    PLAN: Rhogam ordered  As pt is Rh neg  Tilda BurrowFERGUSON,Caleigha Zale V 11/30/2016,7:53 AM    Patient ID: Laurie [name], female   DOB: [birth year], 21 y.o.   MRN: [id number]

## 2016-12-01 LAB — URINALYSIS, ROUTINE W REFLEX MICROSCOPIC
Bilirubin Urine: NEGATIVE
Glucose, UA: NEGATIVE mg/dL
KETONES UR: NEGATIVE mg/dL
Nitrite: NEGATIVE
PROTEIN: 100 mg/dL — AB
Specific Gravity, Urine: 1.02 (ref 1.005–1.030)
pH: 6 (ref 5.0–8.0)

## 2016-12-01 LAB — RH IG WORKUP (INCLUDES ABO/RH)
ABO/RH(D): A NEG
Fetal Screen: NEGATIVE
GESTATIONAL AGE(WKS): 30
Unit division: 0

## 2016-12-01 LAB — GLUCOSE, CAPILLARY
GLUCOSE-CAPILLARY: 102 mg/dL — AB (ref 65–99)
Glucose-Capillary: 71 mg/dL (ref 65–99)
Glucose-Capillary: 100 mg/dL — ABNORMAL HIGH (ref 65–99)

## 2016-12-01 MED ORDER — GLYBURIDE 5 MG PO TABS
5.0000 mg | ORAL_TABLET | Freq: Two times a day (BID) | ORAL | Status: DC
  Administered 2016-12-01 (×2): 5 mg via ORAL
  Filled 2016-12-01 (×3): qty 1

## 2016-12-01 NOTE — Progress Notes (Addendum)
Patient ID: Laurie [name], female   DOB: 09-27-1996, 21 y.o.   MRN: [id number] FACULTY PRACTICE ANTEPARTUM(COMPREHENSIVE) NOTE  Laurie [name] is a 21 y.o. G3P1011 at 10463w1d by best clinical estimate who is admitted for rupture of membranes.   Fetal presentation is cephalic. Length of Stay:  18  Days  Subjective: Patient reports vaginal and urine odor. Patient reports the fetal movement as active. Patient reports uterine contraction  activity as none. Patient reports  vaginal bleeding as none. Patient describes fluid per vagina as None.  Vitals:  Blood pressure (!) 114/58, pulse 83, temperature 98.4 F (36.9 C), temperature source Oral, resp. rate 18, height 5\' 3"  (1.6 m), weight 135 lb (61.2 kg), last menstrual period 06/06/2016, SpO2 100 %, unknown if currently breastfeeding. Physical Examination:  General appearance - alert, well appearing, and in no distress Chest - normal effort Abdomen - gravid, non-tender Fundal Height:  size equals dates Extremities: Homans sign is negative, no sign of DVT  Membranes:intact  Fetal Monitoring:  Baseline: 140 bpm, Variability: Good {> 6 bpm), Accelerations: Reactive and Decelerations: Absent  Labs:  Results for orders placed or performed during the hospital encounter of 11/13/16 (from the past 24 hour(s))  Glucose, capillary   Collection Time: 11/30/16  3:49 PM  Result Value Ref Range   Glucose-Capillary 154 (H) 65 - 99 mg/dL  Glucose, capillary   Collection Time: 11/30/16 10:36 PM  Result Value Ref Range   Glucose-Capillary 199 (H) 65 - 99 mg/dL    Medications:  Scheduled . docusate sodium  100 mg Oral Daily  . famotidine  20 mg Oral BID  . ferrous sulfate  325 mg Oral BID WC  . glyBURIDE  5 mg Oral BID WC  . nicotine  7 mg Transdermal Daily  . prenatal multivitamin  1 tablet Oral Q1200  . sodium chloride flush  3 mL Intravenous Q12H   I have reviewed the patient's current medications.  ASSESSMENT: Principal Problem:   Preterm  premature rupture of membranes (PPROM) with unknown onset of labor Active Problems:   Polysubstance abuse   History of cesarean delivery   Tobacco abuse   Rh negative state in antepartum period   GDM (gestational diabetes mellitus)  PLAN: Check urine culture today Delivery with s/sx's of infection CBG are way too high-not following diet-->will add Glyburide 5 mg bid for next few days as steroid effect weans.  Reva Boresanya S Darrio Bade, MD 12/01/2016,8:55 AM

## 2016-12-02 LAB — GLUCOSE, CAPILLARY
GLUCOSE-CAPILLARY: 123 mg/dL — AB (ref 65–99)
Glucose-Capillary: 109 mg/dL — ABNORMAL HIGH (ref 65–99)
Glucose-Capillary: 110 mg/dL — ABNORMAL HIGH (ref 65–99)
Glucose-Capillary: 56 mg/dL — ABNORMAL LOW (ref 65–99)
Glucose-Capillary: 77 mg/dL (ref 65–99)

## 2016-12-02 MED ORDER — NITROFURANTOIN MONOHYD MACRO 100 MG PO CAPS
100.0000 mg | ORAL_CAPSULE | Freq: Two times a day (BID) | ORAL | Status: DC
  Administered 2016-12-02 – 2016-12-03 (×4): 100 mg via ORAL
  Filled 2016-12-02 (×5): qty 1

## 2016-12-02 MED ORDER — GLYBURIDE 2.5 MG PO TABS
2.5000 mg | ORAL_TABLET | Freq: Two times a day (BID) | ORAL | Status: DC
  Administered 2016-12-02 (×2): 2.5 mg via ORAL
  Filled 2016-12-02 (×3): qty 1

## 2016-12-02 NOTE — Progress Notes (Signed)
Patient's leakage of fluid color changed from clear to pink tinge. New pad placed. No C/O of pain. Will continue to monitor.

## 2016-12-02 NOTE — Progress Notes (Addendum)
Patient ID: Laurie [name], female   DOB: [birth year], 21 y.o.   MRN: [id number] FACULTY PRACTICE ANTEPARTUM(COMPREHENSIVE) NOTE  Laurie [name] is a 21 y.o. G3P1011 at [redacted]w[redacted]d by best clinical estimate who is admitted for PROM.   Fetal presentation is breech. Length of Stay:  19  Days  Subjective: Reports bright red bleeding and cramping yesterday Patient reports the fetal movement as active. Patient reports uterine contraction  activity as irregular, every 2-3 minutes. Patient reports  vaginal bleeding as less flow than a normal period. Patient describes fluid per vagina as Clear.  Vitals:  Blood pressure (!) 85/35, pulse 69, temperature 98 F (36.7 C), temperature source Oral, resp. rate 16, height 5\' 3"  (1.6 m), weight 135 lb (61.2 kg), last menstrual period 06/06/2016, SpO2 99 %, unknown if currently breastfeeding. Physical Examination:  General appearance - alert, well appearing, and in no distress Chest - normal effort Abdomen - gravid, NT Fundal Height:  size equals dates Extremities: Homans sign is negative, no sign of DVT  Membranes:ruptured, clear fluid  Fetal Monitoring:  Baseline: 130 bpm, Variability: Good {> 6 bpm), Accelerations: Reactive and Decelerations: Absent x 3  Labs:  Results for orders placed or performed during the hospital encounter of 11/13/16 (from the past 24 hour(s))  Glucose, capillary   Collection Time: 12/01/16  9:04 AM  Result Value Ref Range   Glucose-Capillary 102 (H) 65 - 99 mg/dL  Urinalysis, Routine w reflex microscopic   Collection Time: 12/01/16  9:30 AM  Result Value Ref Range   Color, Urine AMBER (A) YELLOW   APPearance CLOUDY (A) CLEAR   Specific Gravity, Urine 1.020 1.005 - 1.030   pH 6.0 5.0 - 8.0   Glucose, UA NEGATIVE NEGATIVE mg/dL   Hgb urine dipstick LARGE (A) NEGATIVE   Bilirubin Urine NEGATIVE NEGATIVE   Ketones, ur NEGATIVE NEGATIVE mg/dL   Protein, ur 213 (A) NEGATIVE mg/dL   Nitrite NEGATIVE NEGATIVE   Leukocytes, UA SMALL  (A) NEGATIVE   RBC / HPF TOO NUMEROUS TO COUNT 0 - 5 RBC/hpf   WBC, UA TOO NUMEROUS TO COUNT 0 - 5 WBC/hpf   Bacteria, UA RARE (A) NONE SEEN   Squamous Epithelial / LPF 6-30 (A) NONE SEEN   WBC Clumps PRESENT    Mucous PRESENT    Ca Oxalate Crys, UA PRESENT   Glucose, capillary   Collection Time: 12/01/16 12:16 PM  Result Value Ref Range   Glucose-Capillary 71 65 - 99 mg/dL   Comment 1 Notify RN   Glucose, capillary   Collection Time: 12/01/16  5:37 PM  Result Value Ref Range   Glucose-Capillary 100 (H) 65 - 99 mg/dL  Glucose, capillary   Collection Time: 12/02/16 12:05 AM  Result Value Ref Range   Glucose-Capillary 110 (H) 65 - 99 mg/dL  Glucose, capillary   Collection Time: 12/02/16  7:53 AM  Result Value Ref Range   Glucose-Capillary 56 (L) 65 - 99 mg/dL   Comment 1 Notify RN      Medications:  Scheduled . docusate sodium  100 mg Oral Daily  . famotidine  20 mg Oral BID  . ferrous sulfate  325 mg Oral BID WC  . glyBURIDE  2.5 mg Oral BID WC  . nicotine  7 mg Transdermal Daily  . nitrofurantoin (macrocrystal-monohydrate)  100 mg Oral Q12H  . prenatal multivitamin  1 tablet Oral Q1200  . sodium chloride flush  3 mL Intravenous Q12H   I have reviewed the patient's current  medications.  ASSESSMENT: Principal Problem:   Preterm premature rupture of membranes (PPROM) with unknown onset of labor Active Problems:   Polysubstance abuse   History of cesarean delivery   Tobacco abuse   Rh negative state in antepartum period   GDM (gestational diabetes mellitus)   PLAN: If bleeding is persistent or heavy, may need delivery or with s/sx's of chorio Abx for presumed UTI + check culture  Reva Boresanya S Jeri Rawlins, MD 12/02/2016,8:00 AM

## 2016-12-02 NOTE — Progress Notes (Signed)
CBG 56 mg/dl.   Pt given 4 oz regular soda and 2 graham crackers.  Will recheck CBG in 15 minutes.

## 2016-12-03 DIAGNOSIS — O24415 Gestational diabetes mellitus in pregnancy, controlled by oral hypoglycemic drugs: Secondary | ICD-10-CM

## 2016-12-03 LAB — GLUCOSE, CAPILLARY
GLUCOSE-CAPILLARY: 114 mg/dL — AB (ref 65–99)
GLUCOSE-CAPILLARY: 98 mg/dL (ref 65–99)
Glucose-Capillary: 187 mg/dL — ABNORMAL HIGH (ref 65–99)
Glucose-Capillary: 224 mg/dL — ABNORMAL HIGH (ref 65–99)

## 2016-12-03 LAB — CULTURE, OB URINE

## 2016-12-03 MED ORDER — GLYBURIDE 5 MG PO TABS
5.0000 mg | ORAL_TABLET | Freq: Two times a day (BID) | ORAL | Status: DC
  Administered 2016-12-03 – 2016-12-05 (×5): 5 mg via ORAL
  Filled 2016-12-03 (×6): qty 1

## 2016-12-03 NOTE — Progress Notes (Signed)
Inpatient Diabetes Program Recommendations  Inpatient Diabetes Program Recommendations  Diabetes Treatment Program Recommendations  ADA Standards of Care 2016 Diabetes in Pregnancy Target Glucose Ranges:  Fasting: 60 - 90 mg/dL Preprandial: 60 - 161105 mg/dL 1 hr postprandial: Less than 140mg /dL (from first bite of meal) 2 hr postprandial: Less than 120 mg/dL (from first bite of meal)     Lab Results  Component Value Date   GLUCAP 187 (H) 12/03/2016   HGBA1C 4.8 11/14/2016    Review of Glycemic Control Results for Laurie BoozerSELF, Taviana E (MRN [id number]) as of 12/03/2016 07:37  Ref. Range 12/02/2016 08:21 12/02/2016 10:28 12/02/2016 17:02 12/03/2016 00:30 12/03/2016 06:04  Glucose-Capillary Latest Ref Range: 65 - 99 mg/dL 77 811123 (H) 914109 (H) 782224 (H) 187 (H)   Inpatient Diabetes Program Recommendations:  Noted hypoglycemia yesterday with elevated CBGs pm and am. Please consider increase in Glyburide to 5 mg bid.  If continued elevated CBGs with increase in Glyburide, please consider ordering CBGs QID (fasting and 2 hour post prandial) and use Diabetic Pregnant Patient order set to order Novolog correction if needed.  Thank you, Billy FischerJudy E. Tuck Dulworth, RN, MSN, CDE Inpatient Glycemic Control Team Team Pager [phone] (8am-5pm) 12/03/2016 7:39 AM

## 2016-12-03 NOTE — Progress Notes (Signed)
Patient ID: Laurie [name], female   DOB: [birth year], 21 y.o.   MRN: [id number] FACULTY PRACTICE ANTEPARTUM(COMPREHENSIVE) NOTE  Laurie [name] is a 21 y.o. G3P1011 at [redacted]w[redacted]d by best clinical estimate who is admitted for PROM.   Fetal presentation is breech. Length of Stay:  20  Days  Subjective: Patient reports some cramping and persistent leakage of fluid, particularly overnight Patient reports the fetal movement as active. Patient reports uterine contraction  activity as irregular, every 2-3 minutes. Patient reports no vaginal bleeding  Patient describes fluid per vagina as Clear.  Vitals:  Blood pressure (!) 114/51, pulse 92, temperature 99.4 F (37.4 C), temperature source Oral, resp. rate 18, height 5\' 3"  (1.6 m), weight 135 lb (61.2 kg), last menstrual period 06/06/2016, SpO2 100 %, unknown if currently breastfeeding. Physical Examination:  General appearance - alert, well appearing, and in no distress Chest - normal effort Abdomen - gravid, NT Fundal Height:  size equals dates Extremities: Homans sign is negative, no sign of DVT  Membranes:ruptured, clear fluid  Fetal Monitoring:  Baseline: 140 bpm, Variability: Good {> 6 bpm), Accelerations: Reactive and Decelerations: Absent x 3  Labs:  Results for orders placed or performed during the hospital encounter of 11/13/16 (from the past 24 hour(s))  Glucose, capillary   Collection Time: 12/02/16 10:28 AM  Result Value Ref Range   Glucose-Capillary 123 (H) 65 - 99 mg/dL  Glucose, capillary   Collection Time: 12/02/16  5:02 PM  Result Value Ref Range   Glucose-Capillary 109 (H) 65 - 99 mg/dL  Type and screen [facility]   Collection Time: 12/02/16  5:50 PM  Result Value Ref Range   ABO/RH(D) A NEG    Antibody Screen POS    Sample Expiration 12/05/2016    Antibody Identification PASSIVELY ACQUIRED ANTI-D    Unit Number [id number]    Blood Component Type RBC LR PHER1    Unit division 00    Status of Unit  ALLOCATED    Transfusion Status OK TO TRANSFUSE    Crossmatch Result COMPATIBLE    Unit Number [id number]    Blood Component Type RBC LR PHER1    Unit division 00    Status of Unit ALLOCATED    Transfusion Status OK TO TRANSFUSE    Crossmatch Result COMPATIBLE   Glucose, capillary   Collection Time: 12/03/16 12:30 AM  Result Value Ref Range   Glucose-Capillary 224 (H) 65 - 99 mg/dL  Glucose, capillary   Collection Time: 12/03/16  6:04 AM  Result Value Ref Range   Glucose-Capillary 187 (H) 65 - 99 mg/dL     Medications:  Scheduled . docusate sodium  100 mg Oral Daily  . famotidine  20 mg Oral BID  . ferrous sulfate  325 mg Oral BID WC  . glyBURIDE  5 mg Oral BID WC  . nicotine  7 mg Transdermal Daily  . nitrofurantoin (macrocrystal-monohydrate)  100 mg Oral Q12H  . prenatal multivitamin  1 tablet Oral Q1200  . sodium chloride flush  3 mL Intravenous Q12H   I have reviewed the patient's current medications.  ASSESSMENT: Principal Problem:   Preterm premature rupture of membranes (PPROM) with unknown onset of labor Active Problems:   Polysubstance abuse   History of cesarean delivery   Tobacco abuse   Rh negative state in antepartum period   GDM (gestational diabetes mellitus)   PLAN: 1) PPROM - continue monitoring for si/sx of chorio - Fetal status remains reassuring  2)  GDM - Elevated values overnight and fasting. Will increase glyburide to 5 mg BID  3) Positive chlamydia on admission - TOC today  4) UTI - Urine culture neg thus far - complete 5 day course of macrobid   Catalina AntiguaPeggy Deontae Robson, MD 12/03/2016,9:29 AM

## 2016-12-04 LAB — GLUCOSE, CAPILLARY
GLUCOSE-CAPILLARY: 119 mg/dL — AB (ref 65–99)
GLUCOSE-CAPILLARY: 127 mg/dL — AB (ref 65–99)
GLUCOSE-CAPILLARY: 161 mg/dL — AB (ref 65–99)
Glucose-Capillary: 145 mg/dL — ABNORMAL HIGH (ref 65–99)

## 2016-12-04 LAB — GC/CHLAMYDIA PROBE AMP (~~LOC~~) NOT AT ARMC
CHLAMYDIA, DNA PROBE: NEGATIVE
Neisseria Gonorrhea: NEGATIVE

## 2016-12-04 NOTE — Progress Notes (Signed)
Patient informs RN that she will be going downstairs to sign some paper work. RN reminds pt to take wheelchair. Patient states, "they have been letting me walk". RN educated to take wheelchair per MD orders

## 2016-12-04 NOTE — Progress Notes (Signed)
CSW spoke with CPS Supervisor/S. Greggory StallionGeorge who states patient's attorney has been in contact with patient and has arranged, by patient's request, to complete relinquishment of parental rights documents in patient's hospital room tonight.  CPS, attorney and notary will be present.  Staff is aware.  Projected time is 5:00pm.  CSW available for support tomorrow as desired by patient.

## 2016-12-04 NOTE — Progress Notes (Addendum)
FACULTY PRACTICE ANTEPARTUM COMPREHENSIVE PROGRESS NOTE  Laurie [name] is a 21 y.o. G3P1011 at 1639w4d  who is admitted for PPROM  Fetal presentation is breech on 1/31 Length of Stay: HD#22  Subjective: No s/s of infection, PTL or decreased FM.   Vitals:  Blood pressure (!) 103/51, pulse 77, temperature 98.5 F (36.9 C), temperature source Oral, resp. rate 18, height 5\' 3"  (1.6 m), weight 61.2 kg (135 lb), last menstrual period 06/06/2016, SpO2 100 %, unknown if currently breastfeeding.   Physical Examination: Gen NAD Lungs: no resp distress Abd soft, gravid, non tender Ext non tender, no c/c/e  Fetal Monitoring: 145 baseline, +accels, no decel (one questionable variable at the beginning vs artifact), mod variability, toco quiet   Labs:  Results for orders placed or performed during the hospital encounter of 11/13/16 (from the past 24 hour(s))  Glucose, capillary   Collection Time: 12/03/16 12:35 PM  Result Value Ref Range   Glucose-Capillary 114 (H) 65 - 99 mg/dL  Glucose, capillary   Collection Time: 12/03/16  7:33 PM  Result Value Ref Range   Glucose-Capillary 98 65 - 99 mg/dL  Glucose, capillary   Collection Time: 12/04/16 12:53 AM  Result Value Ref Range   Glucose-Capillary 161 (H) 65 - 99 mg/dL  Glucose, capillary   Collection Time: 12/04/16  6:11 AM  Result Value Ref Range   Glucose-Capillary 127 (H) 65 - 99 mg/dL    Imaging Studies:    N/A   Medications:  Scheduled . docusate sodium  100 mg Oral Daily  . famotidine  20 mg Oral BID  . ferrous sulfate  325 mg Oral BID WC  . glyBURIDE  5 mg Oral BID WC  . nicotine  7 mg Transdermal Daily  . nitrofurantoin (macrocrystal-monohydrate)  100 mg Oral Q12H  . prenatal multivitamin  1 tablet Oral Q1200  . sodium chloride flush  3 mL Intravenous Q12H   I have reviewed the patient's current medications.  ASSESSMENT: Patient stable  PLAN: *Pregnancy: routine care. NST qday *PPROM:  -s/p rescue BMZ course  2/9-10 -s/p latency abx.  *Preterm: see above. S/p NICU consult already *GDMa2: continue with qid checks. Patient on glyburide bid but still with elevated BS values. Pt on DM diet but has cookies, regular soda and candy in the room. D/w her importance of good control and has been 4-5d since steroid course and likely elevated due to diet. Told pt that if values aren't in better control will have to do insulin. Will order nutrition consult for patient *PPx: SCDs, OOB ad lib *no PNC, h/o polysubstance abuse: negative UDS on admission. SW following *Hep C: normal CMP on 2/6 *+CT: tx on admission. TOC ordered for today *h/o c-section: repeat for delivery, given malpresentation.  *anemia: rpt CBC for tomorrow. Continue with po iron -s/p IV iron. 2/7 H/H 8.4 and 26.4 from 7.1 and 22.2 continue with PO iron *ID: will d/c macrobid given negative UCx on 2/12 *FEN/GI: see above. Nutrition consulted. *Dispo: standard indications, 34wks  [place] Laurie Laurie Scarpulla, MD 12/04/2016,9:14 AM

## 2016-12-04 NOTE — Progress Notes (Signed)
  Nutrition Pt aware that she is to limit foods high in sugar. Discussed consequences to infant once born. Pt has been consuming her own snacks, encouraged her to order snacks from nutritional services. Pt gets hungry late at night. Suggested she order a whole sandwich plus sugar free beverage  as bedtime snack ( 30 g CHO). Encouraged ordering of double protein portions for satiety. Reviewed serum glucose goals.   Elisabeth CaraKatherine Donnielle Addison M.Odis LusterEd. R.D. LDN Neonatal Nutrition Support Specialist/RD III Pager [phone]      Phone [phone]

## 2016-12-04 NOTE — Progress Notes (Addendum)
Pt not in room, RN unsure when pt left.  Pt didn't notify RN regarding leaving unit.

## 2016-12-05 DIAGNOSIS — O98313 Other infections with a predominantly sexual mode of transmission complicating pregnancy, third trimester: Secondary | ICD-10-CM

## 2016-12-05 DIAGNOSIS — O24419 Gestational diabetes mellitus in pregnancy, unspecified control: Secondary | ICD-10-CM

## 2016-12-05 DIAGNOSIS — Z3A30 30 weeks gestation of pregnancy: Secondary | ICD-10-CM

## 2016-12-05 LAB — TYPE AND SCREEN
BLOOD PRODUCT EXPIRATION DATE: 201803012359
BLOOD PRODUCT EXPIRATION DATE: 201803022359
UNIT TYPE AND RH: 9500
UNIT TYPE AND RH: 9500

## 2016-12-05 LAB — GLUCOSE, CAPILLARY
GLUCOSE-CAPILLARY: 168 mg/dL — AB (ref 65–99)
GLUCOSE-CAPILLARY: 134 mg/dL — AB (ref 65–99)
Glucose-Capillary: 118 mg/dL — ABNORMAL HIGH (ref 65–99)

## 2016-12-05 LAB — CBC
HCT: 28.6 % — ABNORMAL LOW (ref 36.0–46.0)
HEMOGLOBIN: 9.4 g/dL — AB (ref 12.0–15.0)
MCH: 27.1 pg (ref 26.0–34.0)
MCHC: 32.9 g/dL (ref 30.0–36.0)
MCV: 82.4 fL (ref 78.0–100.0)
Platelets: 164 10*3/uL (ref 150–400)
RBC: 3.47 MIL/uL — ABNORMAL LOW (ref 3.87–5.11)
WBC: 7.8 10*3/uL (ref 4.0–10.5)

## 2016-12-05 NOTE — Progress Notes (Signed)
Patient went out walking about 9:30.  I discussed with her about using a wheelchair and not walking that it may cause her to go into labor.  She states she understands.

## 2016-12-05 NOTE — Progress Notes (Signed)
Patient ID: [name], female   DOB: Jan 30, 1996, 21 y.o.   MRN: [id number] FACULTY PRACTICE ANTEPARTUM(COMPREHENSIVE) NOTE  Tashanna Dolin Moree is a 21 y.o. G3P1011 at [redacted]w[redacted]d by best clinical estimate who is admitted for PROM.   Fetal presentation is breech. Length of Stay:  22  Days  Subjective: Patient reports some cramping and persistent leakage of fluid, particularly overnight Patient reports the fetal movement as active. Patient reports uterine contraction  activity as irregular, every 2-3 minutes. Patient reports no vaginal bleeding  Patient describes fluid per vagina as Clear.  Vitals:  Blood pressure (!) 117/55, pulse 84, temperature 98.4 F (36.9 C), temperature source Oral, resp. rate 16, height 5\' 3"  (1.6 m), weight 135 lb (61.2 kg), last menstrual period 06/06/2016, SpO2 100 %, unknown if currently breastfeeding. Physical Examination: General appearance - alert, well appearing, and in no distress Chest - normal effort Abdomen - gravid, NT Fundal Height:  size equals dates Extremities: Homans sign is negative, no sign of DVT  Membranes:ruptured, clear fluid  Fetal Monitoring:  Baseline: 140 bpm, Variability: Good {> 6 bpm), Accelerations: Reactive and Decelerations: Absent x 3  Labs:  Results for orders placed or performed during the hospital encounter of 11/13/16 (from the past 24 hour(s))  Glucose, capillary   Collection Time: 12/04/16  4:04 PM  Result Value Ref Range   Glucose-Capillary 119 (H) 65 - 99 mg/dL   Comment 1 Notify RN    Comment 2 Document in Chart   Glucose, capillary   Collection Time: 12/04/16 11:04 PM  Result Value Ref Range   Glucose-Capillary 145 (H) 65 - 99 mg/dL  CBC   Collection Time: 12/05/16  5:45 AM  Result Value Ref Range   WBC 7.8 4.0 - 10.5 K/uL   RBC 3.47 (L) 3.87 - 5.11 MIL/uL   Hemoglobin 9.4 (L) 12.0 - 15.0 g/dL   HCT 78.2 (L) 95.6 - 21.3 %   MCV 82.4 78.0 - 100.0 fL   MCH 27.1 26.0 - 34.0 pg   MCHC 32.9 30.0 - 36.0 g/dL   Platelets 086 578 - 469 K/uL  Type and screen [facility]   Collection Time: 12/05/16  5:45 AM  Result Value Ref Range   ABO/RH(D) A NEG    Antibody Screen POS    Sample Expiration 12/08/2016    Antibody Identification PASSIVELY ACQUIRED ANTI-D    DAT, IgG NEG    Unit Number [id number]    Blood Component Type RBC LR PHER1    Unit division 00    Status of Unit ALLOCATED    Transfusion Status OK TO TRANSFUSE    Crossmatch Result COMPATIBLE    Unit Number [id number]    Blood Component Type RBC LR PHER1    Unit division 00    Status of Unit ALLOCATED    Transfusion Status OK TO TRANSFUSE    Crossmatch Result COMPATIBLE   Glucose, capillary   Collection Time: 12/05/16  5:54 AM  Result Value Ref Range   Glucose-Capillary 168 (H) 65 - 99 mg/dL     Medications:  Scheduled . docusate sodium  100 mg Oral Daily  . famotidine  20 mg Oral BID  . ferrous sulfate  325 mg Oral BID WC  . glyBURIDE  5 mg Oral BID WC  . nicotine  7 mg Transdermal Daily  . prenatal multivitamin  1 tablet Oral Q1200  . sodium chloride flush  3 mL Intravenous Q12H   I have reviewed the patient's  current medications.  ASSESSMENT: Principal Problem:   Preterm premature rupture of membranes (PPROM) with unknown onset of labor Active Problems:   Polysubstance abuse   History of cesarean delivery   Tobacco abuse   Rh negative state in antepartum period   GDM (gestational diabetes mellitus)   PLAN: 1) PPROM - continue monitoring for si/sx of chorio - Fetal status remains reassuring  2) GDM - Elevated values overnight and fasting due to snacking. Continue Glyburide to 5 mg BID. Counseled about appropriate snacks to avoid blood sugar elevations.   3) Positive chlamydia on admission - TOC pending.  4) UTI - Urine culture neg thus far - complete 5 day course of macrobid   Jaynie CollinsUgonna Houda Brau, MD 12/05/2016,12:28 PM

## 2016-12-06 LAB — GLUCOSE, CAPILLARY
GLUCOSE-CAPILLARY: 59 mg/dL — AB (ref 65–99)
GLUCOSE-CAPILLARY: 87 mg/dL (ref 65–99)
GLUCOSE-CAPILLARY: 112 mg/dL — AB (ref 65–99)
Glucose-Capillary: 144 mg/dL — ABNORMAL HIGH (ref 65–99)

## 2016-12-06 MED ORDER — ZOLPIDEM TARTRATE 5 MG PO TABS
5.0000 mg | ORAL_TABLET | Freq: Every day | ORAL | Status: DC
  Administered 2016-12-07 – 2016-12-20 (×15): 5 mg via ORAL
  Filled 2016-12-06 (×15): qty 1

## 2016-12-06 MED ORDER — GLYBURIDE 5 MG PO TABS
7.5000 mg | ORAL_TABLET | Freq: Two times a day (BID) | ORAL | Status: DC
  Filled 2016-12-06: qty 1

## 2016-12-06 MED ORDER — GLYBURIDE 5 MG PO TABS
5.0000 mg | ORAL_TABLET | Freq: Every day | ORAL | Status: DC
  Administered 2016-12-06 – 2016-12-12 (×7): 5 mg via ORAL
  Filled 2016-12-06 (×8): qty 1

## 2016-12-06 MED ORDER — GLYBURIDE 5 MG PO TABS
7.5000 mg | ORAL_TABLET | Freq: Every day | ORAL | Status: DC
  Administered 2016-12-06 – 2016-12-13 (×8): 7.5 mg via ORAL
  Filled 2016-12-06 (×8): qty 1

## 2016-12-06 NOTE — Progress Notes (Signed)
Patient ID: Laurie [name], female   DOB: 05-22-1996, 21 y.o.   MRN: [id number] FACULTY PRACTICE ANTEPARTUM(COMPREHENSIVE) NOTE  Laurie [name] is a 21 y.o. G3P1011 at 2144w6d by best clinical estimate who is admitted for PROM.   Fetal presentation is breech. Length of Stay:  23  Days  Subjective: Patient reports no cramping but has persistent leakage of fluid. Patient reports the fetal movement as active. Patient reports uterine contraction  activity as irregular, every 2-3 minutes. Patient reports no vaginal bleeding  Patient describes fluid per vagina as Clear.  Vitals:  Blood pressure (!) 117/55, pulse 86, temperature 98.3 F (36.8 C), temperature source Oral, resp. rate 16, height 5\' 3"  (1.6 m), weight 135 lb (61.2 kg), last menstrual period 06/06/2016, SpO2 100 %, unknown if currently breastfeeding. Physical Examination: General appearance - alert, well appearing, and in no distress Chest - normal effort Abdomen - gravid, NT Fundal Height:  size equals dates Extremities: Homans sign is negative, no sign of DVT  Membranes:ruptured, clear fluid  Fetal Monitoring:  Baseline: 150 bpm, Variability: Moderate {> 6 bpm), Accelerations: Reactive and Decelerations: Absent x 3  Labs:  Results for orders placed or performed during the hospital encounter of 11/13/16 (from the past 24 hour(s))  Glucose, capillary   Collection Time: 12/05/16  2:52 PM  Result Value Ref Range   Glucose-Capillary 134 (H) 65 - 99 mg/dL   Comment 1 Notify RN    Comment 2 Document in Chart   Glucose, capillary   Collection Time: 12/05/16 11:10 PM  Result Value Ref Range   Glucose-Capillary 118 (H) 65 - 99 mg/dL  Glucose, capillary   Collection Time: 12/06/16  6:26 AM  Result Value Ref Range   Glucose-Capillary 144 (H) 65 - 99 mg/dL     Medications:  Scheduled . docusate sodium  100 mg Oral Daily  . famotidine  20 mg Oral BID  . ferrous sulfate  325 mg Oral BID WC  . glyBURIDE  7.5 mg Oral BID WC  .  nicotine  7 mg Transdermal Daily  . prenatal multivitamin  1 tablet Oral Q1200  . sodium chloride flush  3 mL Intravenous Q12H   I have reviewed the patient's current medications.  ASSESSMENT: Principal Problem:   Preterm premature rupture of membranes (PPROM) with unknown onset of labor Active Problems:   Polysubstance abuse   History of cesarean delivery   Tobacco abuse   Rh negative state in antepartum period   GDM (gestational diabetes mellitus)   PLAN: 1) PPROM - continue monitoring for si/sx of chorio - Fetal status remains reassuring  2) GDM - Elevated values overnight and fasting due to snacking. Increased Glyburide to 7.5 mg qhs, will continue 5 mg qam. Counseled about appropriate snacks to avoid blood sugar elevations.   3) Positive chlamydia on admission - TOC still pending.  Continue routine antenatal care   Jaynie CollinsUgonna Anyanwu, MD 12/06/2016,7:01 AM

## 2016-12-07 LAB — GLUCOSE, CAPILLARY
Glucose-Capillary: 223 mg/dL — ABNORMAL HIGH (ref 65–99)
Glucose-Capillary: 136 mg/dL — ABNORMAL HIGH (ref 65–99)

## 2016-12-07 NOTE — Progress Notes (Addendum)
Patient ID: Laurie [name], female   DOB: 10-Dec-1995, 21 y.o.   MRN: [id number] FACULTY PRACTICE ANTEPARTUM(COMPREHENSIVE) NOTE  Laurie [name] is a 21 y.o. G3P1011 at 3329w0d by best clinical estimate who is admitted for PPROM.   Fetal presentation is breech. Length of Stay:  24  Days  Subjective: Continues to leak fluid Patient reports the fetal movement as active. Patient reports uterine contraction  activity as none. Patient reports  vaginal bleeding as none. Patient describes fluid per vagina as Clear.  Vitals:  Blood pressure (!) 94/48, pulse 83, temperature 98.4 F (36.9 C), temperature source Oral, resp. rate 16, height 5\' 3"  (1.6 m), weight 135 lb (61.2 kg), last menstrual period 06/06/2016, SpO2 99 %, unknown if currently breastfeeding. Physical Examination:  General appearance - alert, well appearing, and in no distress Chest - normal effort Abdomen - gravid, NT Fundal Height:  size equals dates Extremities: Homans sign is negative, no sign of DVT  Membranes:ruptured, clear fluid  Fetal Monitoring:  Baseline: 140 bpm, Variability: Good {> 6 bpm), Accelerations: Reactive and Decelerations: Absent x 3  Labs:  Results for orders placed or performed during the hospital encounter of 11/13/16 (from the past 24 hour(s))  Glucose, capillary   Collection Time: 12/06/16  1:26 PM  Result Value Ref Range   Glucose-Capillary 59 (L) 65 - 99 mg/dL  Glucose, capillary   Collection Time: 12/06/16  3:48 PM  Result Value Ref Range   Glucose-Capillary 87 65 - 99 mg/dL  Glucose, capillary   Collection Time: 12/06/16 10:00 PM  Result Value Ref Range   Glucose-Capillary 112 (H) 65 - 99 mg/dL  Glucose, capillary   Collection Time: 12/07/16  6:05 AM  Result Value Ref Range   Glucose-Capillary 223 (H) 65 - 99 mg/dL    Medications:  Scheduled . docusate sodium  100 mg Oral Daily  . famotidine  20 mg Oral BID  . ferrous sulfate  325 mg Oral BID WC  . glyBURIDE  5 mg Oral Q breakfast  .  glyBURIDE  7.5 mg Oral QHS  . nicotine  7 mg Transdermal Daily  . prenatal multivitamin  1 tablet Oral Q1200  . sodium chloride flush  3 mL Intravenous Q12H  . zolpidem  5 mg Oral QHS   I have reviewed the patient's current medications.  ASSESSMENT: Principal Problem:   Preterm premature rupture of membranes (PPROM) with unknown onset of labor Active Problems:   Polysubstance abuse   History of cesarean delivery   Tobacco abuse   Rh negative state in antepartum period   GDM (gestational diabetes mellitus)   PLAN: Continue inpatient monitoring Advised of need for CBG control-risk of IUFD Continue Glyburide 5 q am and 7.5 q hs Delivery with s/sx's of chorio  Reva Boresanya S Nashaun Hillmer, MD 12/07/2016,8:04 AM

## 2016-12-07 NOTE — Progress Notes (Signed)
Pt concerned about fetal hic-ups and increased fetal movement. Pt reassured that fetal movement is a good sign and that hic-ups are normal. Pt educated about kick counts and the importance of fetal movement. Pt educated about factors that can increase fetal movement: caffeine, sugar, nicotine, and normal circadian rhythm.  Pt also informed to notify the RN if she ever feels like the baby is not moving as much as normal, so she can be put back on FHM.

## 2016-12-07 NOTE — Progress Notes (Signed)
Patient has deferred EFM so far today, and is outside, after being asked not to go outside and not to smoke.

## 2016-12-07 NOTE — Progress Notes (Signed)
Fasting CBG 223 mg/dl. Patient had not been fasting throughout the night, patient eating snacks and drinking soda throughout night until 0430.

## 2016-12-08 LAB — RAPID URINE DRUG SCREEN, HOSP PERFORMED
Amphetamines: NOT DETECTED
Barbiturates: NOT DETECTED
Benzodiazepines: NOT DETECTED
Cocaine: NOT DETECTED
Opiates: NOT DETECTED
TETRAHYDROCANNABINOL: NOT DETECTED

## 2016-12-08 LAB — GLUCOSE, CAPILLARY
GLUCOSE-CAPILLARY: 119 mg/dL — AB (ref 65–99)
GLUCOSE-CAPILLARY: 163 mg/dL — AB (ref 65–99)
Glucose-Capillary: 102 mg/dL — ABNORMAL HIGH (ref 65–99)

## 2016-12-08 NOTE — Progress Notes (Signed)
Patient ID: Ancil BoozerSkylar E Celestine, female   DOB: 01-13-1996, 21 y.o.   MRN: [id number] FACULTY PRACTICE ANTEPARTUM(COMPREHENSIVE) NOTE  Thornton DalesSkylar E Hayduk is a 21 y.o. G3P1011 at 8676w1d by best clinical estimate who is admitted for PPROM.   Fetal presentation is breech. Length of Stay:  25  Days  Subjective: Patient reports persistent leakage of fluid Patient reports the fetal movement as active. Patient reports uterine contraction  activity as none. Patient reports  vaginal bleeding as none. Patient describes fluid per vagina as Clear.  Vitals:  Blood pressure (!) 110/54, pulse 92, temperature 98.3 F (36.8 C), temperature source Oral, resp. rate 18, height 5\' 3"  (1.6 m), weight 135 lb (61.2 kg), last menstrual period 06/06/2016, SpO2 100 %, unknown if currently breastfeeding. Physical Examination:  General appearance - alert, well appearing, and in no distress Chest - normal effort Abdomen - gravid, NT Fundal Height:  size equals dates Extremities: Homans sign is negative, no sign of DVT  Membranes:ruptured, clear fluid  Fetal Monitoring:  Baseline: 145 bpm, Variability: Good {> 6 bpm), Accelerations: Reactive and Decelerations: Absent x 3 Toco: no contractions  Labs:  Results for orders placed or performed during the hospital encounter of 11/13/16 (from the past 24 hour(s))  Glucose, capillary   Collection Time: 12/07/16  6:52 PM  Result Value Ref Range   Glucose-Capillary 136 (H) 65 - 99 mg/dL   Comment 1 Notify RN   Glucose, capillary   Collection Time: 12/08/16 12:17 AM  Result Value Ref Range   Glucose-Capillary 163 (H) 65 - 99 mg/dL   Comment 1 Notify RN    Comment 2 Document in Chart     Medications:  Scheduled . docusate sodium  100 mg Oral Daily  . famotidine  20 mg Oral BID  . ferrous sulfate  325 mg Oral BID WC  . glyBURIDE  5 mg Oral Q breakfast  . glyBURIDE  7.5 mg Oral QHS  . nicotine  7 mg Transdermal Daily  . prenatal multivitamin  1 tablet Oral Q1200  . sodium  chloride flush  3 mL Intravenous Q12H  . zolpidem  5 mg Oral QHS   I have reviewed the patient's current medications.  ASSESSMENT: Principal Problem:   Preterm premature rupture of membranes (PPROM) with unknown onset of labor Active Problems:   Polysubstance abuse   History of cesarean delivery   Tobacco abuse   Rh negative state in antepartum period   GDM (gestational diabetes mellitus)   PLAN: Continue inpatient monitoring Advised of need for CBG control-risk of IUFD Continue Glyburide 5 q am and 7.5 q hs Discussed compliance with diet as patient overeats in the middle of the night. Discussed starting insulin if CBGs remain elevated  Continue monitoring for s/sx's of chorio Continue current antepartum care  Catalina AntiguaPeggy Malyiah Fellows, MD 12/08/2016,7:23 AM

## 2016-12-08 NOTE — Progress Notes (Signed)
PP CBG resulted 163 mg/dl but this result is not an accurate reflection of PP because pt continues to eat cookies and drink [place] soda during the 2 hrs. MD was made aware and pt was educated about making good choices with her food selections.

## 2016-12-09 LAB — GLUCOSE, CAPILLARY
GLUCOSE-CAPILLARY: 102 mg/dL — AB (ref 65–99)
Glucose-Capillary: 119 mg/dL — ABNORMAL HIGH (ref 65–99)
Glucose-Capillary: 178 mg/dL — ABNORMAL HIGH (ref 65–99)
Glucose-Capillary: 51 mg/dL — ABNORMAL LOW (ref 65–99)
Glucose-Capillary: 75 mg/dL (ref 65–99)
Glucose-Capillary: 94 mg/dL (ref 65–99)

## 2016-12-09 LAB — TYPE AND SCREEN
BLOOD PRODUCT EXPIRATION DATE: 201803222359
Blood Product Expiration Date: 201803012359
Blood Product Expiration Date: 201803022359
Blood Product Expiration Date: 201803152359
UNIT TYPE AND RH: 600
UNIT TYPE AND RH: 9500
UNIT TYPE AND RH: 9500
Unit Type and Rh: 600

## 2016-12-09 NOTE — Progress Notes (Signed)
Fasting blood sugar = 75; alert and oriented x4; asymptomatic .  Offered snacks but patient refused.  Encouraged to order her breakfast early. Claims., " I had some blood sugar lower than this and it is ok.  I will eat when I am ready."  Will report to incoming RN to continue to monitor.

## 2016-12-09 NOTE — Progress Notes (Signed)
Patient ID: Laurie [name], female   DOB: [birth year], 21 y.o.   MRN: [id number] ACULTY PRACTICE ANTEPARTUM COMPREHENSIVE PROGRESS NOTE  Laurie [name] is a 21 y.o. G3P1011 at 521w2d  who is admitted for PROM.   Fetal presentation is breech. Length of Stay:  26  Days  Subjective: Pt with no complaints. Patient reports good fetal movement.  She reports no uterine contractions, no bleeding and no loss of fluid per vagina.  Vitals:  Blood pressure (!) 107/53, pulse (!) 101, temperature 99.1 F (37.3 C), temperature source Oral, resp. rate 16, height 5\' 3"  (1.6 m), weight 135 lb (61.2 kg), last menstrual period 06/06/2016, SpO2 100 %, unknown if currently breastfeeding. Physical Examination: General appearance - alert, well appearing, and in no distress Abdomen - soft, nontender, nondistended, no masses or organomegaly gravid Cervical Exam: Not evaluated.  Extremities: extremities normal, atraumatic, no cyanosis or edema  Membranes:ruptured  Fetal Monitoring:  Baseline: 140's bpm, Variability: Good {> 6 bpm), Accelerations: Reactive and Decelerations: Absent  Labs:  Results for orders placed or performed during the hospital encounter of 11/13/16 (from the past 24 hour(s))  Glucose, capillary   Collection Time: 12/08/16  8:04 AM  Result Value Ref Range   Glucose-Capillary 119 (H) 65 - 99 mg/dL  Rapid urine drug screen (hospital performed)   Collection Time: 12/08/16 12:11 PM  Result Value Ref Range   Opiates NONE DETECTED NONE DETECTED   Cocaine NONE DETECTED NONE DETECTED   Benzodiazepines NONE DETECTED NONE DETECTED   Amphetamines NONE DETECTED NONE DETECTED   Tetrahydrocannabinol NONE DETECTED NONE DETECTED   Barbiturates NONE DETECTED NONE DETECTED  Glucose, capillary   Collection Time: 12/08/16  7:28 PM  Result Value Ref Range   Glucose-Capillary 102 (H) 65 - 99 mg/dL  Glucose, capillary   Collection Time: 12/09/16  6:12 AM  Result Value Ref Range   Glucose-Capillary 75 65 - 99  mg/dL    Imaging Studies:    None pending   Medications:  Scheduled . docusate sodium  100 mg Oral Daily  . famotidine  20 mg Oral BID  . ferrous sulfate  325 mg Oral BID WC  . glyBURIDE  5 mg Oral Q breakfast  . glyBURIDE  7.5 mg Oral QHS  . nicotine  7 mg Transdermal Daily  . prenatal multivitamin  1 tablet Oral Q1200  . sodium chloride flush  3 mL Intravenous Q12H  . zolpidem  5 mg Oral QHS   I have reviewed the patient's current medications.  ASSESSMENT: Patient Active Problem List   Diagnosis Date Noted  . GDM (gestational diabetes mellitus) 11/28/2016  . Tobacco abuse 11/25/2016  . Rh negative state in antepartum period 11/25/2016  . Preterm premature rupture of membranes (PPROM) with unknown onset of labor 11/13/2016  . History of cesarean delivery 01/12/2016  . Polysubstance abuse 08/31/2015    PLAN: Continue inpatient monitoring CBGs improved over the last 24 hours Continue Glyburide 5 q am and 7.5 q hs Continue monitoring for s/sx's of chorio Continue current antepartum care  [facility] Hospital[facility] Harraway-Smith 12/09/2016,7:37 AM

## 2016-12-10 LAB — GLUCOSE, CAPILLARY
GLUCOSE-CAPILLARY: 152 mg/dL — AB (ref 65–99)
GLUCOSE-CAPILLARY: 147 mg/dL — AB (ref 65–99)
Glucose-Capillary: 59 mg/dL — ABNORMAL LOW (ref 65–99)
Glucose-Capillary: 109 mg/dL — ABNORMAL HIGH (ref 65–99)
Glucose-Capillary: 116 mg/dL — ABNORMAL HIGH (ref 65–99)

## 2016-12-10 NOTE — Progress Notes (Signed)
Dr. Adrian BlackwaterStinson aware of BS of 6659. Pt given apple juice and afternoon snack. Pt slept through lunch. Pt educated on keeping on schedule with her meals. Will continue to monitor BS. Laurie DaneERRI L Kayvan Hoefling, RN

## 2016-12-10 NOTE — Progress Notes (Signed)
Pt continues to be non compliant with eating. Pt is eating muffins and cake brought in by visitors. Pt educated about the need to comply with diet regimen. Carmelina DaneERRI L Doreen Garretson, RN

## 2016-12-10 NOTE — Progress Notes (Signed)
Pt reporting increased mucous type vaginal discharge with a pink tinge. Pt states that she is feeling abdominal cramping. Patient placed back on EFM. Will continue to monitor. Laurie DaneERRI L Shaconda Hajduk, RN

## 2016-12-10 NOTE — Progress Notes (Signed)
FACULTY PRACTICE ANTEPARTUM(COMPREHENSIVE) NOTE  Laurie [name] is a 21 y.o. G3P1011 at [redacted]w[redacted]d by best clinical estimate who is admitted for rupture of membranes, PROM.   Fetal presentation is breech. Length of Stay:  27  Days  Subjective:  Patient reports the fetal movement as active. Patient reports uterine contraction  activity as none. Patient reports  vaginal bleeding as none. Patient describes fluid per vagina as Clear.  Vitals:  Blood pressure (!) 104/44, pulse 86, temperature 98.6 F (37 C), temperature source Oral, resp. rate 18, height 5\' 3"  (1.6 m), weight 61.2 kg (135 lb), last menstrual period 06/06/2016, SpO2 99 %, unknown if currently breastfeeding. Physical Examination:  General appearance - alert, well appearing, and in no distress Heart - normal rate and regular rhythm Abdomen - soft, nontender, nondistended Fundal Height:  size equals dates Cervical Exam: Not evaluated.  Extremities: extremities normal, atraumatic, no cyanosis or edema and Homans sign is negative, no sign of DVT  Membranes:ruptured  Fetal Monitoring:     Fetal Heart Rate A  Mode External filed at 12/09/2016 2340  Baseline Rate (A) 140 bpm filed at 12/09/2016 2340  Variability 6-25 BPM filed at 12/09/2016 2340  Accelerations 15 x 15 filed at 12/09/2016 2340  Decelerations None filed at 12/09/2016 2340     Labs:  Results for orders placed or performed during the hospital encounter of 11/13/16 (from the past 24 hour(s))  Glucose, capillary   Collection Time: 12/09/16 11:26 AM  Result Value Ref Range   Glucose-Capillary 119 (H) 65 - 99 mg/dL   Comment 1 Notify RN    Comment 2 Document in Chart   Glucose, capillary   Collection Time: 12/09/16  3:48 PM  Result Value Ref Range   Glucose-Capillary 51 (L) 65 - 99 mg/dL   Comment 1 Notify RN    Comment 2 Document in Chart   Glucose, capillary   Collection Time: 12/09/16  4:13 PM  Result Value Ref Range   Glucose-Capillary 94 65 - 99 mg/dL   Comment 1 Notify RN    Comment 2 Document in Chart   Glucose, capillary   Collection Time: 12/09/16  6:41 PM  Result Value Ref Range   Glucose-Capillary 102 (H) 65 - 99 mg/dL   Comment 1 Notify RN    Comment 2 Document in Chart   Type and screen [facility]   Collection Time: 12/09/16  7:18 PM  Result Value Ref Range   Blood Product Unit Number [id number]    Unit Type and Rh 9500    Blood Product Expiration Date 119147829562    Blood Product Unit Number [id number]    Unit Type and Rh 9500    Blood Product Expiration Date [id number]   Glucose, capillary   Collection Time: 12/09/16 11:32 PM  Result Value Ref Range   Glucose-Capillary 178 (H) 65 - 99 mg/dL    Imaging Studies:      Medications:  Scheduled . docusate sodium  100 mg Oral Daily  . famotidine  20 mg Oral BID  . ferrous sulfate  325 mg Oral BID WC  . glyBURIDE  5 mg Oral Q breakfast  . glyBURIDE  7.5 mg Oral QHS  . nicotine  7 mg Transdermal Daily  . prenatal multivitamin  1 tablet Oral Q1200  . sodium chloride flush  3 mL Intravenous Q12H  . zolpidem  5 mg Oral QHS   I have reviewed the patient's current medications.  ASSESSMENT: Patient Active Problem List  Diagnosis Date Noted  . GDM (gestational diabetes mellitus) 11/28/2016  . Tobacco abuse 11/25/2016  . Rh negative state in antepartum period 11/25/2016  . Preterm premature rupture of membranes (PPROM) with unknown onset of labor 11/13/2016  . History of cesarean delivery 01/12/2016  . Polysubstance abuse 08/31/2015    PLAN: Continue hospitalization for PPROM  Scheryl DarterJames Lasheka Kempner 12/10/2016,7:35 AM

## 2016-12-10 NOTE — Progress Notes (Signed)
Pt taken off monitors and reassured that baby looks good on the monitor. Pt states that all her needs have been met at this time.

## 2016-12-11 DIAGNOSIS — Z3A31 31 weeks gestation of pregnancy: Secondary | ICD-10-CM

## 2016-12-11 LAB — GLUCOSE, CAPILLARY
Glucose-Capillary: 199 mg/dL — ABNORMAL HIGH (ref 65–99)
Glucose-Capillary: 90 mg/dL (ref 65–99)
Glucose-Capillary: 92 mg/dL (ref 65–99)
Glucose-Capillary: 109 mg/dL — ABNORMAL HIGH (ref 65–99)

## 2016-12-11 MED ORDER — CYCLOBENZAPRINE HCL 10 MG PO TABS
10.0000 mg | ORAL_TABLET | Freq: Three times a day (TID) | ORAL | Status: DC | PRN
Start: 2016-12-11 — End: 2016-12-21
  Administered 2016-12-12 – 2016-12-21 (×15): 10 mg via ORAL
  Filled 2016-12-11 (×18): qty 1

## 2016-12-11 NOTE — Progress Notes (Signed)
Inpatient Diabetes Program Recommendations  Diabetes Treatment Program Recommendations  ADA Standards of Care 2017 Diabetes in Pregnancy Target Glucose Ranges:  Fasting: 60 - 90 mg/dL Preprandial: 60 - 098105 mg/dL 1 hr postprandial: Less than 140mg /dL (from first bite of meal) 2 hr postprandial: Less than 120 mg/dL (from first bit of meal)   Lab Results  Component Value Date   GLUCAP 92 12/11/2016   HGBA1C 4.8 11/14/2016    Review of Glycemic Control Results for Ancil BoozerSELF, Laurie [name] (MRN [id number]) as of 12/11/2016 12:13  Ref. Range 12/10/2016 13:03 12/10/2016 16:25 12/10/2016 16:59 12/10/2016 19:01 12/11/2016 01:32 12/11/2016 09:25 12/11/2016 12:05  Glucose-Capillary Latest Ref Range: 65 - 99 mg/dL 562147 (H) 59 (L) 130109 (H) 116 (H) 199 (H) 90 92   Inpatient Diabetes Program Recommendations:  Noted pm hypoglycemia in the afternoon the past 2 days. Please consider decreasing am Glyburide to 2.5 mg am dose.  Thank you, Billy FischerJudy [name]. Aydin Hink, RN, MSN, CDE Inpatient Glycemic Control Team Team Pager [phone] (8am-5pm) 12/11/2016 12:17 PM

## 2016-12-11 NOTE — Progress Notes (Signed)
Pt found eating cake at this time. CBG 199. Pt states she forgot and eat in between the 2hrs PP. Pt educated

## 2016-12-11 NOTE — Progress Notes (Signed)
FACULTY PRACTICE ANTEPARTUM(COMPREHENSIVE) NOTE  Laurie [name] is a 21 y.o. G3P1011 at [redacted]w[redacted]d by best clinical estimate who is admitted for rupture of membranes, PPROM.   Fetal presentation is breech. Length of Stay:  28  Days  Subjective: Reports increased pelvic pressure and suprapubic pain. No dysuria. Patient reports the fetal movement as active. Patient reports uterine contraction activity as occasional. Patient reports  vaginal bleeding as none. Patient describes fluid per vagina as Clear.  Vitals:  Blood pressure (!) 113/52, pulse (!) 101, temperature 98.6 F (37 C), temperature source Oral, resp. rate 16, height 5\' 3"  (1.6 m), weight 135 lb (61.2 kg), last menstrual period 06/06/2016, SpO2 100 %, unknown if currently breastfeeding. Physical Examination: General appearance - alert, well appearing, and in no distress Heart - normal rate and regular rhythm Abdomen - soft, nontender, nondistended Fundal Height:  size equals dates Cervical Exam: Dilation: Fingertip Effacement (%):  ("long") Exam by:: Dr. Macon Large Extremities: extremities normal, atraumatic, no cyanosis or edema and Homans sign is negative, no sign of DVT  Membranes:ruptured  Fetal Monitoring:     Fetal Heart Rate A  Mode External filed at 12/10/2016 2225  Baseline Rate (A) 140 bpm filed at 12/10/2016 2225  Variability 6-25 BPM filed at 12/10/2016 2225  Accelerations 15 x 15 filed at 12/10/2016 2225  Decelerations Variable filed at 12/10/2016 2225  Multiple birth? N filed at 12/08/2016 1804     Labs:  Results for orders placed or performed during the hospital encounter of 11/13/16 (from the past 24 hour(s))  Glucose, capillary   Collection Time: 12/10/16  1:03 PM  Result Value Ref Range   Glucose-Capillary 147 (H) 65 - 99 mg/dL  Glucose, capillary   Collection Time: 12/10/16  4:25 PM  Result Value Ref Range   Glucose-Capillary 59 (L) 65 - 99 mg/dL  Glucose, capillary   Collection Time: 12/10/16  4:59  PM  Result Value Ref Range   Glucose-Capillary 109 (H) 65 - 99 mg/dL  Glucose, capillary   Collection Time: 12/10/16  7:01 PM  Result Value Ref Range   Glucose-Capillary 116 (H) 65 - 99 mg/dL   Comment 1 Notify RN    Comment 2 Document in Chart   Glucose, capillary   Collection Time: 12/11/16  1:32 AM  Result Value Ref Range   Glucose-Capillary 199 (H) 65 - 99 mg/dL  Glucose, capillary   Collection Time: 12/11/16  9:25 AM  Result Value Ref Range   Glucose-Capillary 90 65 - 99 mg/dL    Imaging Studies:    US Ob Comp + 14 Wk  Result Date: 11/11/2016  SECOND TRIMESTER SONOGRAM Laurie [name] is in the office for  second trimester sonogram. She is a 21 y.o. year old G66P1011 with Estimated Date of Delivery: 02/08/17 by early ultrasound now at  [redacted]w[redacted]d weeks gestation. Thus far the pregnancy has been complicated by LPNC,polysubstance abuse.. GESTATION:SINGLETON PRESENTATION:breech FETAL ACTIVITY:         Heart rate         134         The fetus is active. AMNIOTIC FLUID:The amniotic fluid volume is  normal, SDP : 6.2 cm. PLACENTA LOCALIZATION anterior  GRADE 2 CERVIX:Measures 4 cm ADNEXA:The ovaries are normal. GESTATIONAL AGE AND  BIOMETRICS: Gestational criteria: Estimated Date of Delivery: 02/08/17 by early ultrasound now at [redacted]w[redacted]d Previous Scans:1 outside ultrasound          BIPARIETAL DIAMETER  6.65 cm         26+6 weeks HEAD CIRCUMFERENCE           24.69 cm         26+6 weeks ABDOMINAL CIRCUMFERENCE           22.47 cm         26+6 weeks FEMUR LENGTH           4.96 cm         26+5 weeks                                                       AVERAGE EGA(BY THIS SCAN):  26+6 weeks                                                 ESTIMATED FETAL WEIGHT:       1014  grams, 46 % ANATOMICAL SURVEY                                                                            COMMENTS CEREBRAL VENTRICLES yes normal  CHOROID PLEXUS yes normal  CEREBELLUM yes normal  CISTERNA MAGNA yes normal  NUCHAL REGION  yes normal  ORBITS yes normal  NASAL BONE yes normal  NOSE/LIP yes normal  FACIAL PROFILE yes normal  4 CHAMBERED HEART yes normal  OUTFLOW TRACTS yes normal  DIAPHRAGM yes normal  STOMACH yes normal  RENAL REGION yes normal  BLADDER yes normal  CORD INSERTION yes normal  3 VESSEL CORD yes normal  SPINE yes normal  ARMS/HANDS yes normal  LEGS/FEET yes normal  GENITALIA yes normal Female **pt doesn't want to know today**     SUSPECTED ABNORMALITIES:  no QUALITY OF SCAN: satisfactory TECHNICIAN COMMENTS: [place] 27+2 wks,breech,cx 4 cm,ant pl gr 2,normal ov's bilat,fhr 134 bpm,svp of fluid 6.2 cm,efw 1014 g 46%,anatomy complete,no obvious abnormalities seen,[name] 02/08/2017 by  Outside [place] A copy of this report including all images has been saved and backed up to a second source for retrieval if needed. All measures and details of the anatomical scan, placentation, fluid volume and pelvic anatomy are contained in that report. Laurie [name] 11/11/2016 4:39 PM Clinical Impression and recommendations: I have reviewed the sonogram results above. Combined with the patient's current clinical course, below are my impressions and any appropriate recommendations for management based on the sonographic findings: 1. Anatomic survey WNL. Growth at 46 %ile. 2. [name] by this u/s" : Estimated Date of Delivery: 02/08/17 [name]   [place] Mfm Ob Comp + 14 Wk  Result Date: 11/20/2016 ----------------------------------------------------------------------  OBSTETRICS REPORT                      (Signed Final 11/20/2016 12:29 am) ---------------------------------------------------------------------- Patient Info  ID #:       [id number]  D.O.B.:  Nov 08, 1995 (20 yrs)  Name:       Laurie [name]                   Visit Date: 11/19/2016 12:05 pm ---------------------------------------------------------------------- Performed By  Performed By:     Marcellina Millin          Ref. Address:      [address]                                                              [place], [place]                                                              [address]  Attending:        Particia Nearing MD       Secondary Phy.:    3rd Nursing- 3rd                                                              floor [phone]  Referred By:      Billey Gosling                Location:          [facility] MD ---------------------------------------------------------------------- Orders   #  Description                                 Code   1  [place] MFM OB COMP + 14 WK                      76805.01  ----------------------------------------------------------------------   #  Ordered By               Order #        Accession #    Episode #   1  [place]          [id number]      [id number]     [id number]  ---------------------------------------------------------------------- Indications   [redacted] weeks gestation of pregnancy                Z3A.28   Premature rupture of membranes - leaking  O42.90   fluid   Vaginal bleeding in pregnancy, third           O46.93   trimester (spotting at times)   Poor obstetric history: Previous gestational   O09.299   diabetes   Drug use complicating pregnancy, third         O99.323   trimester (hx of heroin abuse, last drug test   +marijuana)   Chronic Hepatitis C complicating pregnancy,    O98.419 B18.2   antepartum   Encounter for antenatal screening for          Z36.3   malformations  ---------------------------------------------------------------------- OB History  Gravidity:    3         Term:   1        Prem:   0        SAB:   1  TOP:          0       Ectopic:  0        Living: 1 ---------------------------------------------------------------------- Fetal Evaluation  Num Of Fetuses:     1  Fetal Heart         143  Rate(bpm):  Cardiac Activity:   Observed  Presentation:       Frank breech  Placenta:           Fundal, above  cervical os  P. Cord Insertion:  Not well visualized  Amniotic Fluid  AFI FV:      Subjectively low-normal  AFI Sum(cm)     %Tile       Largest Pocket(cm)  4.99            < 3         2.45  RUQ(cm)                     LUQ(cm)        LLQ(cm)  1.21                        2.45           1.33 ---------------------------------------------------------------------- Biometry  BPD:      67.8  mm     G. Age:  27w 2d          9  %    CI:        77.55   %    70 - 86                                                          FL/HC:       20.4  %    18.8 - 20.6  HC:      243.7  mm     G. Age:  26w 3d        < 3  %    HC/AC:       1.01       1.05 - 1.21  AC:      242.3  mm     G. Age:  28w 4d         45  %    FL/BPD:      73.3  %    71 - 87  FL:  49.7  mm     G. Age:  26w 5d          5  %    FL/AC:       20.5  %    20 - 24  HUM:      44.7  mm     G. Age:  26w 4d        < 5  %  Est. FW:    1104   gm     2 lb 7 oz     35  % ---------------------------------------------------------------------- Gestational Age  LMP:           23w 5d        Date:  06/06/16                 [name]:   03/13/17  U/S Today:     27w 2d                                        [name]:   02/16/17  Best:          28w 3d     Det. By:  Marcella Dubs         [name]:   02/08/17                                      (06/18/16) ---------------------------------------------------------------------- Anatomy  Cranium:               Appears normal         Aortic Arch:            Appears normal  Cavum:                 Appears normal         Ductal Arch:            Appears normal  Ventricles:            Appears normal         Diaphragm:              Appears normal  Choroid Plexus:        Appears normal         Stomach:                Appears normal, left                                                                        sided  Cerebellum:            Appears normal         Abdomen:                Appears normal  Posterior Fossa:       Not well visualized    Abdominal Wall:          Not well visualized  Nuchal Fold:           Not applicable (>20    Cord Vessels:  Appears normal ([redacted]                         wks GA)                                        vessel cord)  Face:                  Not well visualized    Kidneys:                Appear normal  Lips:                  Not well visualized    Bladder:                Appears normal  Thoracic:              Appears normal         Spine:                  Appears normal  Heart:                 Not well visualized    Upper Extremities:      Not well visualized  RVOT:                  Not well visualized    Lower Extremities:      Not well visualized  LVOT:                  Appears normal  Other:  Technically difficult due to low amniotic fluid. ---------------------------------------------------------------------- Cervix Uterus Adnexa  Cervix  Length:            3.9  cm.  Normal appearance by transabdominal scan. ---------------------------------------------------------------------- Impression  SIUP at 28+3 weeks  Homero Fellers breech presentation  Normal detailed fetal anatomy; limited views of face, heart  and extremities  Low normal amniotic fluid volume  Measurements consistent with early [place]; EFW at the 35th  %tile ---------------------------------------------------------------------- Recommendations  Follow-up ultrasound for growth in 3-4 weeks ----------------------------------------------------------------------                Particia Nearing, MD Electronically Signed Final Report   11/20/2016 12:29 am ----------------------------------------------------------------------    Medications:  Scheduled . docusate sodium  100 mg Oral Daily  . famotidine  20 mg Oral BID  . ferrous sulfate  325 mg Oral BID WC  . glyBURIDE  5 mg Oral Q breakfast  . glyBURIDE  7.5 mg Oral QHS  . nicotine  7 mg Transdermal Daily  . prenatal multivitamin  1 tablet Oral Q1200  . sodium chloride flush  3 mL Intravenous Q12H  . zolpidem  5 mg Oral QHS    I have reviewed the patient's current medications.  ASSESSMENT: Patient Active Problem List   Diagnosis Date Noted  . GDM (gestational diabetes mellitus) 11/28/2016  . Tobacco abuse 11/25/2016  . Rh negative state in antepartum period 11/25/2016  . Preterm premature rupture of membranes (PPROM) with unknown onset of labor 11/13/2016  . History of cesarean delivery 01/12/2016  . Polysubstance abuse 08/31/2015    PLAN: Continue inpatient monitoring; NST TID.  Category 1 FHR tracing currently. Continue Glyburide 5 q am and 7.5 q hs. Discussed compliance with diet as patient overeats in the middle of the night (had  cake last night, middle of night CBG 199)  Continue monitoring forsigns/symptoms of chorioamnionitis; no signs of progressing PTL currently.  Will check UA to rule out UTI; Flexeril ordered as needed for pelvic floor pain. Continue current antepartum care  Tereso Newcomer, MD 12/11/2016,11:19 AM

## 2016-12-12 ENCOUNTER — Inpatient Hospital Stay (HOSPITAL_COMMUNITY): Payer: 59

## 2016-12-12 LAB — URINALYSIS, ROUTINE W REFLEX MICROSCOPIC
BILIRUBIN URINE: NEGATIVE
GLUCOSE, UA: NEGATIVE mg/dL
Hgb urine dipstick: NEGATIVE
KETONES UR: NEGATIVE mg/dL
Nitrite: NEGATIVE
PH: 6 (ref 5.0–8.0)
PROTEIN: NEGATIVE mg/dL
Specific Gravity, Urine: 1.012 (ref 1.005–1.030)

## 2016-12-12 LAB — GLUCOSE, CAPILLARY
GLUCOSE-CAPILLARY: 160 mg/dL — AB (ref 65–99)
Glucose-Capillary: 94 mg/dL (ref 65–99)
Glucose-Capillary: 47 mg/dL — ABNORMAL LOW (ref 65–99)
Glucose-Capillary: 84 mg/dL (ref 65–99)
Glucose-Capillary: 157 mg/dL — ABNORMAL HIGH (ref 65–99)

## 2016-12-12 LAB — TYPE AND SCREEN
Blood Product Expiration Date: 201803022359
Blood Product Expiration Date: 201803102359
UNIT TYPE AND RH: 9500
UNIT TYPE AND RH: 9500

## 2016-12-12 LAB — CBC
HEMATOCRIT: 32.1 % — AB (ref 36.0–46.0)
Hemoglobin: 10.7 g/dL — ABNORMAL LOW (ref 12.0–15.0)
MCH: 28 pg (ref 26.0–34.0)
MCHC: 33.3 g/dL (ref 30.0–36.0)
MCV: 84 fL (ref 78.0–100.0)
PLATELETS: 125 10*3/uL — AB (ref 150–400)
RBC: 3.82 MIL/uL — ABNORMAL LOW (ref 3.87–5.11)
WBC: 7.3 10*3/uL (ref 4.0–10.5)

## 2016-12-12 NOTE — Progress Notes (Signed)
Hypoglycemic Event  CBG: 47  Treatment: graham crackers & coke per pt request  Symptoms: hot, sweaty, & jittery  Follow-up CBG: Time:17:13 CBG Result:84  Possible Reasons for Event: labile CBG's, noncompliant pt  Comments/MD notified: W. Muhammed, CNM - no new orders    Cherylin MylarMead, Siah Kannan Lynn

## 2016-12-12 NOTE — Progress Notes (Signed)
FACULTY PRACTICE ANTEPARTUM(COMPREHENSIVE) NOTE  Laurie [name] is a 21 y.o. G3P1011 at [redacted]w[redacted]d by best clinical estimate who is admitted for rupture of membranes, PPROM.   Fetal presentation is breech. Length of Stay:  29  Days  Subjective: Reports increased pelvic pressure and suprapubic pain. No dysuria. Patient reports the fetal movement as active. Patient reports uterine contraction activity as occasional. Patient reports  vaginal bleeding as none. Patient describes fluid per vagina as pinkish  Vitals:  Blood pressure (!) 105/44, pulse 89, temperature 98.2 F (36.8 C), temperature source Oral, resp. rate 18, height 5\' 3"  (1.6 m), weight 135 lb (61.2 kg), last menstrual period 06/06/2016, SpO2 100 %, unknown if currently breastfeeding. Physical Examination: General appearance - alert, well appearing, and in no distress Heart - normal rate and regular rhythm Abdomen - soft, nontender, nondistended Fundal Height:  size equals dates Cervical Exam: Dilation: Fingertip Effacement (%): Thick ("long") Cervical Position: Posterior Station: Ballotable Exam by:: Dr. Macon Large Extremities: extremities normal, atraumatic, no cyanosis or edema and Homans sign is negative, no sign of DVT  Membranes:ruptured  Fetal Monitoring:     Reactive nst, no decels   Labs:  Results for orders placed or performed during the hospital encounter of 11/13/16 (from the past 24 hour(s))  Glucose, capillary   Collection Time: 12/11/16  9:25 AM  Result Value Ref Range   Glucose-Capillary 90 65 - 99 mg/dL  Glucose, capillary   Collection Time: 12/11/16 12:05 PM  Result Value Ref Range   Glucose-Capillary 92 65 - 99 mg/dL  Glucose, capillary   Collection Time: 12/11/16  7:18 PM  Result Value Ref Range   Glucose-Capillary 109 (H) 65 - 99 mg/dL    Imaging Studies:    [place] Mfm Ob Comp + 14 Wk  Result Date: 11/20/2016 ----------------------------------------------------------------------  OBSTETRICS REPORT                       (Signed Final 11/20/2016 12:29 am) ---------------------------------------------------------------------- Patient Info  ID #:       [id number]                          D.O.B.:  Mar 19, 1996 (20 yrs)  Name:       Laurie [name]                   Visit Date: 11/19/2016 12:05 pm ---------------------------------------------------------------------- Performed By  Performed By:     Marcellina Millin          Ref. Address:      [address]                                                              [place], [place]  [address]  Attending:        Particia Nearing MD       Secondary Phy.:    3rd Nursing- 3rd                                                              floor [phone]  Referred By:      Billey Gosling                Location:          [facility] MD ---------------------------------------------------------------------- Orders   #  Description                                 Code   1  [place] MFM OB COMP + 14 WK                      76805.01  ----------------------------------------------------------------------   #  Ordered By               Order #        Accession #    Episode #   1   Bing          [id number]      [id number]     [id number]  ---------------------------------------------------------------------- Indications   [redacted] weeks gestation of pregnancy                Z3A.28   Premature rupture of membranes - leaking       O42.90   fluid   Vaginal bleeding in pregnancy, third           O46.93   trimester (spotting at times)   Poor obstetric history: Previous gestational   O09.299   diabetes   Drug use complicating pregnancy, third         O99.323   trimester (hx of heroin abuse, last drug test   +marijuana)   Chronic Hepatitis C complicating pregnancy,    O98.419 B18.2   antepartum   Encounter for antenatal screening for           Z36.3   malformations  ---------------------------------------------------------------------- OB History  Gravidity:    3         Term:   1        Prem:   0        SAB:   1  TOP:          0       Ectopic:  0        Living: 1 ---------------------------------------------------------------------- Fetal Evaluation  Num Of Fetuses:     1  Fetal Heart         143  Rate(bpm):  Cardiac Activity:   Observed  Presentation:       Frank breech  Placenta:           Fundal, above cervical os  P. Cord Insertion:  Not well visualized  Amniotic Fluid  AFI FV:      Subjectively low-normal  AFI Sum(cm)     %Tile       Largest Pocket(cm)  4.99            < 3         2.45  RUQ(cm)                     LUQ(cm)        LLQ(cm)  1.21                        2.45           1.33 ---------------------------------------------------------------------- Biometry  BPD:      67.8  mm     G. Age:  27w 2d          9  %    CI:        77.55   %    70 - 86                                                          FL/HC:       20.4  %    18.8 - 20.6  HC:      243.7  mm     G. Age:  26w 3d        < 3  %    HC/AC:       1.01       1.05 - 1.21  AC:      242.3  mm     G. Age:  28w 4d         45  %    FL/BPD:      73.3  %    71 - 87  FL:       49.7  mm     G. Age:  26w 5d          5  %    FL/AC:       20.5  %    20 - 24  HUM:      44.7  mm     G. Age:  26w 4d        < 5  %  Est. FW:    1104   gm     2 lb 7 oz     35  % ---------------------------------------------------------------------- Gestational Age  LMP:           23w 5d        Date:  06/06/16                 [name]:   03/13/17  U/S Today:     27w 2d                                        [name]:   02/16/17  Best:          28w 3d     Det. ByMarcella Dubs:  Early Ultrasound         [name]:   02/08/17                                      (06/18/16) ---------------------------------------------------------------------- Anatomy  Cranium:  Appears normal         Aortic Arch:            Appears normal  Cavum:                  Appears normal         Ductal Arch:            Appears normal  Ventricles:            Appears normal         Diaphragm:              Appears normal  Choroid Plexus:        Appears normal         Stomach:                Appears normal, left                                                                        sided  Cerebellum:            Appears normal         Abdomen:                Appears normal  Posterior Fossa:       Not well visualized    Abdominal Wall:         Not well visualized  Nuchal Fold:           Not applicable (>20    Cord Vessels:           Appears normal ([redacted]                         wks GA)                                        vessel cord)  Face:                  Not well visualized    Kidneys:                Appear normal  Lips:                  Not well visualized    Bladder:                Appears normal  Thoracic:              Appears normal         Spine:                  Appears normal  Heart:                 Not well visualized    Upper Extremities:      Not well visualized  RVOT:                  Not well visualized    Lower Extremities:      Not well visualized  LVOT:  Appears normal  Other:  Technically difficult due to low amniotic fluid. ---------------------------------------------------------------------- Cervix Uterus Adnexa  Cervix  Length:            3.9  cm.  Normal appearance by transabdominal scan. ---------------------------------------------------------------------- Impression  SIUP at 28+3 weeks  Homero Fellers breech presentation  Normal detailed fetal anatomy; limited views of face, heart  and extremities  Low normal amniotic fluid volume  Measurements consistent with early [place]; EFW at the 35th  %tile ---------------------------------------------------------------------- Recommendations  Follow-up ultrasound for growth in 3-4 weeks ----------------------------------------------------------------------                Particia Nearing, MD Electronically Signed Final  Report   11/20/2016 12:29 am ----------------------------------------------------------------------    Medications:  Scheduled . docusate sodium  100 mg Oral Daily  . famotidine  20 mg Oral BID  . ferrous sulfate  325 mg Oral BID WC  . glyBURIDE  5 mg Oral Q breakfast  . glyBURIDE  7.5 mg Oral QHS  . nicotine  7 mg Transdermal Daily  . prenatal multivitamin  1 tablet Oral Q1200  . sodium chloride flush  3 mL Intravenous Q12H  . zolpidem  5 mg Oral QHS   I have reviewed the patient's current medications.  ASSESSMENT: [redacted]w[redacted]d PPROM Patient Active Problem List   Diagnosis Date Noted  . GDM (gestational diabetes mellitus) 11/28/2016  . Tobacco abuse 11/25/2016  . Rh negative state in antepartum period 11/25/2016  . Preterm premature rupture of membranes (PPROM) with unknown onset of labor 11/13/2016  . History of cesarean delivery 01/12/2016  . Polysubstance abuse 08/31/2015    PLAN: Continue inpatient monitoring; NST TID.  Category 1 FHR tracing currently. Continue Glyburide 5 q am and 7.5 q hs. Discussed compliance with diet as patient overeats in the middle of the night (had cake last night, middle of night CBG 199)  Continue monitoring forsigns/symptoms of chorioamnionitis; no signs of progressing PTL currently.  Will check UA to rule out UTI; Flexeril ordered as needed for pelvic floor pain. Continue current antepartum care  Lazaro Arms, MD 12/12/2016,7:35 AM     Patient ID: Thornton Dales Borelli, female   DOB: 10-08-1996, 21 y.o.   MRN: [id number]

## 2016-12-13 LAB — GLUCOSE, CAPILLARY
GLUCOSE-CAPILLARY: 103 mg/dL — AB (ref 65–99)
GLUCOSE-CAPILLARY: 188 mg/dL — AB (ref 65–99)
Glucose-Capillary: 153 mg/dL — ABNORMAL HIGH (ref 65–99)
Glucose-Capillary: 94 mg/dL (ref 65–99)
Glucose-Capillary: 120 mg/dL — ABNORMAL HIGH (ref 65–99)

## 2016-12-13 MED ORDER — GLYBURIDE 2.5 MG PO TABS
2.5000 mg | ORAL_TABLET | Freq: Every day | ORAL | Status: DC
  Administered 2016-12-13: 2.5 mg via ORAL
  Filled 2016-12-13 (×2): qty 1

## 2016-12-13 NOTE — Progress Notes (Signed)
ACULTY PRACTICE ANTEPARTUM COMPREHENSIVE PROGRESS NOTE  Laurie [name] is a 21 y.o. G3P1011 at [redacted]w[redacted]d  who is admitted for PROM .   Fetal presentation is breech. Length of Stay:  30  Days  Subjective: Pt is without complaints this morning. Patient reports good fetal movement.  She reports no uterine contractions, no bleeding and no loss of fluid per vagina.  Vitals:  Blood pressure (!) 98/54, pulse 70, temperature 98.1 F (36.7 C), temperature source Oral, resp. rate 16, height 5\' 3"  (1.6 m), weight 61.2 kg (135 lb), last menstrual period 06/06/2016, SpO2 100 %, unknown if currently breastfeeding.   Physical Examination: Lungs clear Heart RRR Abd soft + BS non tender Ext non tender  Fetal Monitoring:  130-140's no decels  Labs:  Results for orders placed or performed during the hospital encounter of 11/13/16 (from the past 24 hour(s))  Glucose, capillary   Collection Time: 12/12/16 10:25 AM  Result Value Ref Range   Glucose-Capillary 94 65 - 99 mg/dL  Urinalysis, Routine w reflex microscopic   Collection Time: 12/12/16 11:30 AM  Result Value Ref Range   Color, Urine YELLOW YELLOW   APPearance CLEAR CLEAR   Specific Gravity, Urine 1.012 1.005 - 1.030   pH 6.0 5.0 - 8.0   Glucose, UA NEGATIVE NEGATIVE mg/dL   Hgb urine dipstick NEGATIVE NEGATIVE   Bilirubin Urine NEGATIVE NEGATIVE   Ketones, ur NEGATIVE NEGATIVE mg/dL   Protein, ur NEGATIVE NEGATIVE mg/dL   Nitrite NEGATIVE NEGATIVE   Leukocytes, UA TRACE (A) NEGATIVE   RBC / HPF 0-5 0 - 5 RBC/hpf   WBC, UA 0-5 0 - 5 WBC/hpf   Bacteria, UA RARE (A) NONE SEEN   Squamous Epithelial / LPF 6-30 (A) NONE SEEN   Mucous PRESENT   Glucose, capillary   Collection Time: 12/12/16  1:22 PM  Result Value Ref Range   Glucose-Capillary 160 (H) 65 - 99 mg/dL  CBC   Collection Time: 12/12/16  2:46 PM  Result Value Ref Range   WBC 7.3 4.0 - 10.5 K/uL   RBC 3.82 (L) 3.87 - 5.11 MIL/uL   Hemoglobin 10.7 (L) 12.0 - 15.0 g/dL   HCT 96.0  (L) 45.4 - 46.0 %   MCV 84.0 78.0 - 100.0 fL   MCH 28.0 26.0 - 34.0 pg   MCHC 33.3 30.0 - 36.0 g/dL   RDW NOT CALCULATED 09.8 - 15.5 %   Platelets 125 (L) 150 - 400 K/uL  Type and screen [facility]    Collection Time: 12/12/16  2:46 PM  Result Value Ref Range   Blood Product Unit Number [id number]    Unit Type and Rh 9500    Blood Product Expiration Date [id number]    Blood Product Unit Number [id number]    Unit Type and Rh 9500    Blood Product Expiration Date [id number]   Glucose, capillary   Collection Time: 12/12/16  4:51 PM  Result Value Ref Range   Glucose-Capillary 47 (L) 65 - 99 mg/dL   Comment 1 Notify RN    Comment 2 Document in Chart   Glucose, capillary   Collection Time: 12/12/16  5:13 PM  Result Value Ref Range   Glucose-Capillary 84 65 - 99 mg/dL  Glucose, capillary   Collection Time: 12/12/16  7:00 PM  Result Value Ref Range   Glucose-Capillary 157 (H) 65 - 99 mg/dL  Glucose, capillary   Collection Time: 12/13/16 12:20 AM  Result Value Ref Range   Glucose-Capillary  188 (H) 65 - 99 mg/dL  Glucose, capillary   Collection Time: 12/13/16  8:13 AM  Result Value Ref Range   Glucose-Capillary 103 (H) 65 - 99 mg/dL   Comment 1 Notify RN     Imaging Studies:    U/S yesterday Breech, AFI 3.3 EFW 1729 gms, 47%   Medications:  Scheduled . docusate sodium  100 mg Oral Daily  . famotidine  20 mg Oral BID  . ferrous sulfate  325 mg Oral BID WC  . glyBURIDE  2.5 mg Oral Q breakfast  . glyBURIDE  7.5 mg Oral QHS  . nicotine  7 mg Transdermal Daily  . prenatal multivitamin  1 tablet Oral Q1200  . sodium chloride flush  3 mL Intravenous Q12H  . zolpidem  5 mg Oral QHS   I have reviewed the patient's current medications.  ASSESSMENT: Patient Active Problem List   Diagnosis Date Noted  . GDM (gestational diabetes mellitus) 11/28/2016  . Tobacco abuse 11/25/2016  . Rh negative state in antepartum period 11/25/2016  . Preterm  premature rupture of membranes (PPROM) with unknown onset of labor 11/13/2016  . History of cesarean delivery 01/12/2016  . Polysubstance abuse 08/31/2015    PLAN: Stable. Adjust morning glyburide dose. Discussed importance of following diet and eating at approriate times with pt Continue routine antenatal care.   Hermina StaggersMichael L Vernia Teem 12/13/2016,9:06 AM

## 2016-12-13 NOTE — Progress Notes (Signed)
Checked pt's CBG at 12:15, 2 hours after eating. CBG of 188. Contacted the resident on call and I was told to call the MD on call. Dr. Elroy ChannelIrvin said no further changes needed to be made to pt's plan of care at this time.

## 2016-12-14 LAB — GLUCOSE, CAPILLARY
GLUCOSE-CAPILLARY: 149 mg/dL — AB (ref 65–99)
GLUCOSE-CAPILLARY: 100 mg/dL — AB (ref 65–99)
Glucose-Capillary: 83 mg/dL (ref 65–99)

## 2016-12-14 MED ORDER — INSULIN ASPART 100 UNIT/ML ~~LOC~~ SOLN
6.0000 [IU] | Freq: Three times a day (TID) | SUBCUTANEOUS | Status: DC
  Administered 2016-12-14 – 2016-12-20 (×18): 6 [IU] via SUBCUTANEOUS

## 2016-12-14 MED ORDER — INSULIN DETEMIR 100 UNIT/ML ~~LOC~~ SOLN
8.0000 [IU] | Freq: Every day | SUBCUTANEOUS | Status: DC
  Administered 2016-12-14 – 2016-12-16 (×3): 8 [IU] via SUBCUTANEOUS
  Filled 2016-12-14 (×3): qty 0.08

## 2016-12-14 MED ORDER — INSULIN DETEMIR 100 UNIT/ML ~~LOC~~ SOLN
20.0000 [IU] | Freq: Every day | SUBCUTANEOUS | Status: DC
  Administered 2016-12-14 – 2016-12-16 (×3): 20 [IU] via SUBCUTANEOUS
  Filled 2016-12-14 (×4): qty 0.2

## 2016-12-14 NOTE — Progress Notes (Signed)
Encouraged patient to order food, so she can eat due to having insulin this morning.

## 2016-12-14 NOTE — Progress Notes (Signed)
Encouraged patient to eat, she stated "I will order something soon."

## 2016-12-14 NOTE — Progress Notes (Signed)
Patient up and is now eating.  She has made herself a frozen dinner with 67g of carbs.  She was educated that this is not appropriate food. In my observation of the patient  does not seem concerned about her diet or GDM.

## 2016-12-14 NOTE — Progress Notes (Signed)
Patient still has not ordered a tray she is asleep.

## 2016-12-14 NOTE — Progress Notes (Addendum)
FACULTY PRACTICE ANTEPARTUM COMPREHENSIVE PROGRESS NOTE  Laurie [name] is a 21 y.o. G3P1011 at [redacted]w[redacted]d  who is admitted for PPROM  Fetal presentation is breech on 2/22 Length of Stay: HD#32  Subjective: No s/s of infection, PTL or decreased FM.   Vitals:  Blood pressure (!) 102/53, pulse 98, temperature 98.9 F (37.2 C), temperature source Oral, resp. rate 16, height 5\' 3"  (1.6 m), weight 61.2 kg (135 lb), last menstrual period 06/06/2016, SpO2 98 %, unknown if currently breastfeeding.   Physical Examination: Gen NAD Lungs: no resp distress Abd soft, gravid, non tender Ext non tender, no c/c/e  Fetal Monitoring: 135 baseline, +accels, no decel, mod variability, toco quiet   Labs:  Results for orders placed or performed during the hospital encounter of 11/13/16 (from the past 24 hour(s))  Glucose, capillary   Collection Time: 12/13/16 10:29 AM  Result Value Ref Range   Glucose-Capillary 153 (H) 65 - 99 mg/dL   Comment 1 Notify RN   Glucose, capillary   Collection Time: 12/13/16  4:59 PM  Result Value Ref Range   Glucose-Capillary 94 65 - 99 mg/dL  Glucose, capillary   Collection Time: 12/13/16  9:11 PM  Result Value Ref Range   Glucose-Capillary 120 (H) 65 - 99 mg/dL  Glucose, capillary   Collection Time: 12/14/16  8:34 AM  Result Value Ref Range   Glucose-Capillary 149 (H) 65 - 99 mg/dL    Imaging Studies:    2/22 1729gm, 47%, normal AC. AFI 3.33. Breech.   Medications:  Scheduled . docusate sodium  100 mg Oral Daily  . famotidine  20 mg Oral BID  . ferrous sulfate  325 mg Oral BID WC  . glyBURIDE  2.5 mg Oral Q breakfast  . glyBURIDE  7.5 mg Oral QHS  . nicotine  7 mg Transdermal Daily  . prenatal multivitamin  1 tablet Oral Q1200  . sodium chloride flush  3 mL Intravenous Q12H  . zolpidem  5 mg Oral QHS   I have reviewed the patient's current medications.  ASSESSMENT: Patient stable  PLAN: *Pregnancy: routine care. NST qday *PPROM:  -s/p rescue BMZ  course 2/9-10 -s/p latency abx.  *Preterm: see above. S/p NICU consult already *GDMa2: patient already on GDM diet. At her bedside, are pop tarts, nestle's chocolate chips and soda. I d/w her that given her BS values and lack of diet adherence, it's best to just stop the glyburide and start insulin which she is amenable to. Will do low dosing with Levelmir 20/8 and aspart 6/6/6. Will check 0300 BS values for next few nights, too.  *PPx: SCDs, OOB ad lib *no PNC, h/o polysubstance abuse: negative UDS on admission. SW following *Hep C: normal CMP on 2/6 *+CT: tx on admission. TOC negative *h/o c-section: repeat for delivery, given malpresentation.  *anemia: Continue with po iron. Hct 32 on 2/22.  -s/p IV iron. 2/7 H/H 8.4 and 26.4 from 7.1 and 22.2 continue with PO iron *FEN/GI: see above. Seen by nutrition already *Dispo: standard indications, 34wks  [place] Bingharlie Arrie Zuercher, MD 12/14/2016,9:16 AM

## 2016-12-14 NOTE — Progress Notes (Signed)
Patient has not ordered food. She was instructed on following a balanced diet and not snack on chips and pop tarts which is in her room.  She stated "I'm on insulin now I can eat whatever I want"  RN instructed her that she still needed to follow a strict low carb diet for the development of the baby and to keep blood glucose in the parameters, so that insulin did not have to be increased.

## 2016-12-15 LAB — CBC WITH DIFFERENTIAL/PLATELET
BASOS ABS: 0.1 10*3/uL (ref 0.0–0.1)
BASOS PCT: 1 %
EOS PCT: 0 %
Eosinophils Absolute: 0 10*3/uL (ref 0.0–0.7)
HCT: 31.8 % — ABNORMAL LOW (ref 36.0–46.0)
Hemoglobin: 10.6 g/dL — ABNORMAL LOW (ref 12.0–15.0)
Lymphocytes Relative: 46 %
Lymphs Abs: 3.6 10*3/uL (ref 0.7–4.0)
MCH: 28.5 pg (ref 26.0–34.0)
MCHC: 33.3 g/dL (ref 30.0–36.0)
MCV: 85.5 fL (ref 78.0–100.0)
MONO ABS: 0.4 10*3/uL (ref 0.1–1.0)
MONOS PCT: 5 %
Neutro Abs: 3.8 10*3/uL (ref 1.7–7.7)
Neutrophils Relative %: 49 %
PLATELETS: 128 10*3/uL — AB (ref 150–400)
RBC: 3.72 MIL/uL — ABNORMAL LOW (ref 3.87–5.11)
WBC: 7.7 10*3/uL (ref 4.0–10.5)

## 2016-12-15 LAB — GLUCOSE, CAPILLARY
GLUCOSE-CAPILLARY: 156 mg/dL — AB (ref 65–99)
GLUCOSE-CAPILLARY: 99 mg/dL (ref 65–99)
GLUCOSE-CAPILLARY: 96 mg/dL (ref 65–99)
Glucose-Capillary: 118 mg/dL — ABNORMAL HIGH (ref 65–99)
Glucose-Capillary: 127 mg/dL — ABNORMAL HIGH (ref 65–99)

## 2016-12-15 NOTE — Progress Notes (Signed)
FACULTY PRACTICE ANTEPARTUM(COMPREHENSIVE) NOTE  Laurie [name] is a 21 y.o. G3P1011 at 5359w1d  who is admitted for PROM.   Fetal presentation is breech. Length of Stay:  32  Days  Subjective: Pt continues to leak, has noted in the last 24 hours that she is leaking more, and there;s a yellow portion to the d/c, no meconium Patient reports the fetal movement as active. Patient reports uterine contraction  activity as none. Patient reports  vaginal bleeding as none. Patient describes fluid per vagina as Other clear with slight yellow component.  Vitals:  Blood pressure 111/67, pulse (!) 104, temperature 97.9 F (36.6 C), temperature source Oral, resp. rate 16, height 5\' 3"  (1.6 m), weight 61.2 kg (135 lb), last menstrual period 06/06/2016, SpO2 98 %, unknown if currently breastfeeding. Physical Examination:  General appearance - alert, well appearing, and in no distress and normal appearing weight Heart - normal rate and regular rhythm Abdomen - soft, nontender, nondistended Fundal Height:  size equals dates Cervical Exam: Not evaluated. and found to be not evaluated/ / and fetal presentation is breech. Extremities: extremities normal, atraumatic, no cyanosis or edema and Homans sign is negative, no sign of DVT with DTRs 2+ bilaterally Membranes:ruptured  Fetal Monitoring:  Baseline: 140 bpm, Variability: Good {> 6 bpm), Accelerations: Reactive and Decelerations: Absent  Labs:  Results for orders placed or performed during the hospital encounter of 11/13/16 (from the past 24 hour(s))  Glucose, capillary   Collection Time: 12/14/16  8:34 AM  Result Value Ref Range   Glucose-Capillary 149 (H) 65 - 99 mg/dL  Glucose, capillary   Collection Time: 12/14/16  5:58 PM  Result Value Ref Range   Glucose-Capillary 83 65 - 99 mg/dL  Glucose, capillary   Collection Time: 12/14/16 10:21 PM  Result Value Ref Range   Glucose-Capillary 100 (H) 65 - 99 mg/dL  Glucose, capillary   Collection Time:  12/15/16 12:34 AM  Result Value Ref Range   Glucose-Capillary 127 (H) 65 - 99 mg/dL  Glucose, capillary   Collection Time: 12/15/16  3:19 AM  Result Value Ref Range   Glucose-Capillary 156 (H) 65 - 99 mg/dL  Glucose, capillary   Collection Time: 12/15/16  5:28 AM  Result Value Ref Range   Glucose-Capillary 99 65 - 99 mg/dL    Imaging Studies:     Currently EPIC will not allow sonographic studies to automatically populate into notes.  In the meantime, copy and paste results into note or free text.  Medications:  Scheduled . docusate sodium  100 mg Oral Daily  . famotidine  20 mg Oral BID  . ferrous sulfate  325 mg Oral BID WC  . insulin aspart  6 Units Subcutaneous TID WC  . insulin detemir  20 Units Subcutaneous Daily  . insulin detemir  8 Units Subcutaneous QHS  . nicotine  7 mg Transdermal Daily  . prenatal multivitamin  1 tablet Oral Q1200  . sodium chloride flush  3 mL Intravenous Q12H  . zolpidem  5 mg Oral QHS   I have reviewed the patient's current medications.  ASSESSMENT: Patient Active Problem List   Diagnosis Date Noted  . GDM (gestational diabetes mellitus) 11/28/2016  . Tobacco abuse 11/25/2016  . Rh negative state in antepartum period 11/25/2016  . Preterm premature rupture of membranes (PPROM) with unknown onset of labor 11/13/2016  . History of cesarean delivery 01/12/2016  . Polysubstance abuse 08/31/2015    PLAN: 1. Keep on tid monitoring 2 CBg's 5x/d  including 3 am for now. 3 continue with IVanalgestics 4 po iron Laurie [name] V 12/15/2016,7:41 AM    Patient ID: Laurie [name], female   DOB: 1996/05/23, 21 y.o.   MRN: [id number]

## 2016-12-15 NOTE — Progress Notes (Signed)
Patient back to floor with grandmother.  Her family has brought her chicken nuggets and a milk shake.

## 2016-12-16 LAB — TYPE AND SCREEN
BLOOD PRODUCT EXPIRATION DATE: 201803022359
Blood Product Expiration Date: 201803102359
ISSUE DATE / TIME: 201802251024
UNIT TYPE AND RH: 9500
Unit Type and Rh: 9500

## 2016-12-16 LAB — GLUCOSE, CAPILLARY
GLUCOSE-CAPILLARY: 111 mg/dL — AB (ref 65–99)
GLUCOSE-CAPILLARY: 66 mg/dL (ref 65–99)
GLUCOSE-CAPILLARY: 95 mg/dL (ref 65–99)
GLUCOSE-CAPILLARY: 87 mg/dL (ref 65–99)
Glucose-Capillary: 133 mg/dL — ABNORMAL HIGH (ref 65–99)
Glucose-Capillary: 60 mg/dL — ABNORMAL LOW (ref 65–99)

## 2016-12-16 NOTE — Progress Notes (Signed)
Off unit walking

## 2016-12-16 NOTE — Progress Notes (Signed)
FACULTY PRACTICE ANTEPARTUM(COMPREHENSIVE) NOTE  Laurie [name] is a 21 y.o. G3P1011 at 923w2d  who is admitted for PROM, a continued slow leak Fetal presentation is breech. Length of Stay:  33  Days  Subjective: Pt continues to choose carbohydrate heavy diet Patient reports the fetal movement as active. Patient reports uterine contraction  activity as none. Patient reports  vaginal bleeding as none. Patient describes fluid per vagina as Clear.  Vitals:  Blood pressure (!) 112/57, pulse 93, temperature 98.8 F (37.1 C), temperature source Oral, resp. rate 16, height 5\' 3"  (1.6 m), weight 61.2 kg (135 lb), last menstrual period 06/06/2016, SpO2 100 %, unknown if currently breastfeeding. Physical Examination:  General appearance - alert, well appearing, and in no distress, oriented to person, place, and time and normal appearing weight Heart - normal rate and regular rhythm Abdomen - soft, nontender, nondistended Fundal Height:  size equals dates Cervical Exam: Not evaluated. and found to be / / and fetal presentation is breech. Extremities: extremities normal, atraumatic, no cyanosis or edema and Homans sign is negative, no sign of DVT with DTRs 2+ bilaterally Membranes:ruptured, clear fluid  Fetal Monitoring:  Baseline: 140 bpm, Variability: Fair (1-6 bpm), Accelerations: Reactive and Decelerations: Absent  Labs:  Results for orders placed or performed during the hospital encounter of 11/13/16 (from the past 24 hour(s))  CBC with Differential/Platelet   Collection Time: 12/15/16  8:00 AM  Result Value Ref Range   WBC 7.7 4.0 - 10.5 K/uL   RBC 3.72 (L) 3.87 - 5.11 MIL/uL   Hemoglobin 10.6 (L) 12.0 - 15.0 g/dL   HCT 95.631.8 (L) 21.336.0 - 08.646.0 %   MCV 85.5 78.0 - 100.0 fL   MCH 28.5 26.0 - 34.0 pg   MCHC 33.3 30.0 - 36.0 g/dL   RDW NOT CALCULATED 57.811.5 - 15.5 %   Platelets 128 (L) 150 - 400 K/uL   Neutrophils Relative % 49 %   Neutro Abs 3.8 1.7 - 7.7 K/uL   Lymphocytes Relative 46 %   Lymphs Abs 3.6 0.7 - 4.0 K/uL   Monocytes Relative 5 %   Monocytes Absolute 0.4 0.1 - 1.0 K/uL   Eosinophils Relative 0 %   Eosinophils Absolute 0.0 0.0 - 0.7 K/uL   Basophils Relative 1 %   Basophils Absolute 0.1 0.0 - 0.1 K/uL  Glucose, capillary   Collection Time: 12/15/16 12:05 PM  Result Value Ref Range   Glucose-Capillary 118 (H) 65 - 99 mg/dL  Glucose, capillary   Collection Time: 12/15/16  6:39 PM  Result Value Ref Range   Glucose-Capillary 96 65 - 99 mg/dL  Type and screen [facility] [place]   Collection Time: 12/15/16  7:34 PM  Result Value Ref Range   ABO/RH(D) A NEG    Antibody Screen POS    Sample Expiration 12/18/2016    DAT, IgG NEG    Antibody Identification PASSIVELY ACQUIRED ANTI-D    Unit Number [id number]    Blood Component Type RED CELLS,LR    Unit division 00    Status of Unit ALLOCATED    Transfusion Status OK TO TRANSFUSE    Crossmatch Result COMPATIBLE    Unit Number [id number]    Blood Component Type RED CELLS,LR    Unit division 00    Status of Unit ALLOCATED    Transfusion Status OK TO TRANSFUSE    Crossmatch Result COMPATIBLE   Glucose, capillary   Collection Time: 12/16/16 12:24 AM  Result Value Ref Range  Glucose-Capillary 133 (H) 65 - 99 mg/dL  Glucose, capillary   Collection Time: 12/16/16  5:35 AM  Result Value Ref Range   Glucose-Capillary 111 (H) 65 - 99 mg/dL    Imaging Studies:    Marland Kitchen  Medications:  Scheduled . docusate sodium  100 mg Oral Daily  . famotidine  20 mg Oral BID  . ferrous sulfate  325 mg Oral BID WC  . insulin aspart  6 Units Subcutaneous TID WC  . insulin detemir  20 Units Subcutaneous Daily  . insulin detemir  8 Units Subcutaneous QHS  . nicotine  7 mg Transdermal Daily  . prenatal multivitamin  1 tablet Oral Q1200  . sodium chloride flush  3 mL Intravenous Q12H  . zolpidem  5 mg Oral QHS   I have reviewed the patient's current medications.  ASSESSMENT: Patient Active Problem List    Diagnosis Date Noted  . GDM (gestational diabetes mellitus) 11/28/2016  . Tobacco abuse 11/25/2016  . Rh negative state in antepartum period 11/25/2016  . Preterm premature rupture of membranes (PPROM) with unknown onset of labor 11/13/2016  . History of cesarean delivery 01/12/2016  . Polysubstance abuse 08/31/2015    PLAN: Continue current insulin tx inpt care til del a 34 wk or as clinical condition dictates Delivery at 34 wk or at s/s labor , infection  Laurie [name] V 12/16/2016,6:54 AM    Patient ID: Laurie [name], female   DOB: 1996-03-18, 21 y.o.   MRN: [id number]

## 2016-12-16 NOTE — Progress Notes (Signed)
Instructed patient to save toilet tissue next time for nurse to see. Receptive.

## 2016-12-16 NOTE — Progress Notes (Signed)
Pt received 2 hour post prandial blood sugar check @ 0024. Blood sugar is 133. This was her after dinner blood sugar check. Patient recently returned to the unit with Dr. Reino KentPepper and some snacks. Pt stated she is now going to the waiting area to charge her phone because her charger stopped working in her room.

## 2016-12-17 DIAGNOSIS — F1910 Other psychoactive substance abuse, uncomplicated: Secondary | ICD-10-CM

## 2016-12-17 LAB — GLUCOSE, CAPILLARY
GLUCOSE-CAPILLARY: 108 mg/dL — AB (ref 65–99)
Glucose-Capillary: 96 mg/dL (ref 65–99)
Glucose-Capillary: 81 mg/dL (ref 65–99)
Glucose-Capillary: 102 mg/dL — ABNORMAL HIGH (ref 65–99)

## 2016-12-17 MED ORDER — INSULIN DETEMIR 100 UNIT/ML ~~LOC~~ SOLN
15.0000 [IU] | Freq: Every day | SUBCUTANEOUS | Status: DC
  Administered 2016-12-17 – 2016-12-19 (×3): 15 [IU] via SUBCUTANEOUS
  Administered 2016-12-20: 10 [IU] via SUBCUTANEOUS
  Administered 2016-12-20: 15 [IU] via SUBCUTANEOUS
  Filled 2016-12-17 (×7): qty 0.15

## 2016-12-17 MED ORDER — INSULIN DETEMIR 100 UNIT/ML ~~LOC~~ SOLN: 15.0000 [IU] | Freq: Every day | SUBCUTANEOUS | Status: DC

## 2016-12-17 MED ORDER — INSULIN DETEMIR 100 UNIT/ML ~~LOC~~ SOLN
10.0000 [IU] | Freq: Every day | SUBCUTANEOUS | Status: DC
  Administered 2016-12-17 – 2016-12-19 (×3): 10 [IU] via SUBCUTANEOUS
  Filled 2016-12-17 (×5): qty 0.1

## 2016-12-17 NOTE — Progress Notes (Signed)
Patient ID: Laurie [name], female   DOB: Oct 21, 1996, 21 y.o.   MRN: [id number] FACULTY PRACTICE ANTEPARTUM(COMPREHENSIVE) NOTE  Laurie [name] is a 21 y.o. G3P1011 at 6018w3d  who is admitted for PROM, a continued slow leak Fetal presentation is breech. Length of Stay:  34  Days  Subjective: Pt reports vaginal bleeding earlier this morning when she went to the bathroom. She describes it as bright red and noted in the toilet bowel.  Patient reports the fetal movement as active. Patient reports uterine contraction  activity as none. Patient reports  vaginal bleeding as none. Patient describes fluid per vagina as Clear.  Vitals:  Blood pressure (!) 110/50, pulse 88, temperature 98 F (36.7 C), temperature source Oral, resp. rate 16, height 5\' 3"  (1.6 m), weight 135 lb (61.2 kg), last menstrual period 06/06/2016, SpO2 100 %, unknown if currently breastfeeding. Physical Examination:  [name] appearance - alert, well appearing, and in no distress, oriented to person, place, and time and normal appearing weight Heart - normal rate and regular rhythm Abdomen - soft, nontender, nondistended Fundal Height:  size equals dates Cervical Exam: Not evaluated. and found to be / / and fetal presentation is breech. Extremities: extremities normal, atraumatic, no cyanosis or edema and Homans sign is negative, no sign of DVT with DTRs 2+ bilaterally PAD: scant bright red bleeding on pad. Approximately the size of a dime Membranes:ruptured, clear fluid  Fetal Monitoring:  Baseline: 130 bpm, Variability: Fair (1-6 bpm), Accelerations: Reactive and Decelerations: Absent Toco: irregular contractions  Labs:  Results for orders placed or performed during the hospital encounter of 11/13/16 (from the past 24 hour(s))  Glucose, capillary   Collection Time: 12/16/16 12:05 PM  Result Value Ref Range   Glucose-Capillary 60 (L) 65 - 99 mg/dL  Glucose, capillary   Collection Time: 12/16/16  2:30 PM  Result Value Ref  Range   Glucose-Capillary 66 65 - 99 mg/dL  Glucose, capillary   Collection Time: 12/16/16  5:06 PM  Result Value Ref Range   Glucose-Capillary 95 65 - 99 mg/dL  Glucose, capillary   Collection Time: 12/16/16 10:03 PM  Result Value Ref Range   Glucose-Capillary 87 65 - 99 mg/dL    Imaging Studies:    Marland Kitchen.  Medications:  Scheduled . docusate sodium  100 mg Oral Daily  . famotidine  20 mg Oral BID  . ferrous sulfate  325 mg Oral BID WC  . insulin aspart  6 Units Subcutaneous TID WC  . insulin detemir  20 Units Subcutaneous Daily  . insulin detemir  8 Units Subcutaneous QHS  . nicotine  7 mg Transdermal Daily  . prenatal multivitamin  1 tablet Oral Q1200  . sodium chloride flush  3 mL Intravenous Q12H  . zolpidem  5 mg Oral QHS   I have reviewed the patient's current medications.  ASSESSMENT: Patient Active Problem List   Diagnosis Date Noted  . GDM (gestational diabetes mellitus) 11/28/2016  . Tobacco abuse 11/25/2016  . Rh negative state in antepartum period 11/25/2016  . Preterm premature rupture of membranes (PPROM) with unknown onset of labor 11/13/2016  . History of cesarean delivery 01/12/2016  . Polysubstance abuse 08/31/2015    PLAN: Continue monitoring vaginal bleeding.  Continue monitoring for worsening si/sx of chorioamnionitis CBGs improved on current insulin regimen inpt care til del a 34 wk or as clinical condition dictates Delivery at 34 wk or at s/s labor , infection  Pattie Flaharty 12/17/2016,5:50 AM

## 2016-12-18 DIAGNOSIS — Z3A32 32 weeks gestation of pregnancy: Secondary | ICD-10-CM

## 2016-12-18 DIAGNOSIS — O24414 Gestational diabetes mellitus in pregnancy, insulin controlled: Secondary | ICD-10-CM

## 2016-12-18 DIAGNOSIS — O42913 Preterm premature rupture of membranes, unspecified as to length of time between rupture and onset of labor, third trimester: Secondary | ICD-10-CM

## 2016-12-18 DIAGNOSIS — O34219 Maternal care for unspecified type scar from previous cesarean delivery: Secondary | ICD-10-CM

## 2016-12-18 LAB — TYPE AND SCREEN
ABO/RH(D): A NEG
Antibody Screen: POSITIVE
DAT, IgG: NEGATIVE
UNIT DIVISION: 0
UNIT DIVISION: 0

## 2016-12-18 LAB — CBC
HEMATOCRIT: 32.1 % — AB (ref 36.0–46.0)
HEMOGLOBIN: 10.8 g/dL — AB (ref 12.0–15.0)
MCH: 29 pg (ref 26.0–34.0)
MCHC: 33.6 g/dL (ref 30.0–36.0)
MCV: 86.3 fL (ref 78.0–100.0)
Platelets: 122 10*3/uL — ABNORMAL LOW (ref 150–400)
RBC: 3.72 MIL/uL — AB (ref 3.87–5.11)
WBC: 7 10*3/uL (ref 4.0–10.5)

## 2016-12-18 LAB — GLUCOSE, CAPILLARY
GLUCOSE-CAPILLARY: 104 mg/dL — AB (ref 65–99)
GLUCOSE-CAPILLARY: 96 mg/dL (ref 65–99)
Glucose-Capillary: 91 mg/dL (ref 65–99)

## 2016-12-18 LAB — BPAM RBC
BLOOD PRODUCT EXPIRATION DATE: 201803152359
BLOOD PRODUCT EXPIRATION DATE: 201803222359
UNIT TYPE AND RH: 600
Unit Type and Rh: 600

## 2016-12-18 NOTE — Progress Notes (Signed)
Patient refuses EFM at this time.  She states "I know he is OK because I feel him moving."

## 2016-12-18 NOTE — Progress Notes (Signed)
Patient refuses EFM at this time. 

## 2016-12-18 NOTE — Progress Notes (Signed)
Patient was instructed to order food.  She has not eaten this morning she states 'I don't have an appetite."  She was encouraged to order food due to her insulin dose.

## 2016-12-18 NOTE — Progress Notes (Signed)
Patient refuses EFM at this time.

## 2016-12-18 NOTE — Progress Notes (Signed)
Patient ID: Ancil BoozerSkylar E Niederer, female   DOB: 09-May-1996, 21 y.o.   MRN: [id number] FACULTY PRACTICE ANTEPARTUM(COMPREHENSIVE) NOTE  Thornton DalesSkylar E Kerchner is a 21 y.o. G3P1011 at 2938w3d  who is admitted for PROM, a continued slow leak Fetal presentation is breech. Length of Stay:  35  Days  Subjective: No bleeding today. Leaks fluid intermittently. Mild tenderness in Left fundus. No contractions, but mild cramping and nausea. Denies fevers, chills.  Patient reports the fetal movement as active. Patient reports uterine contraction  activity as none. Patient reports  vaginal bleeding as none. Patient describes fluid per vagina as Clear.  Vitals:  Blood pressure 117/61, pulse 98, temperature 98.7 F (37.1 C), temperature source Oral, resp. rate 18, height 5\' 3"  (1.6 m), weight 135 lb (61.2 kg), last menstrual period 06/06/2016, SpO2 100 %, unknown if currently breastfeeding. Physical Examination:  General appearance - alert, well appearing, and in no distress, oriented to person, place, and time and normal appearing weight Heart - normal rate and regular rhythm Abdomen - soft, nondistended. Mild tenderness in left fundus Fundal Height:  size equals dates Cervical Exam: Not evaluated. and found to be / / and fetal presentation is breech. Extremities: extremities normal, atraumatic, no cyanosis or edema and Homans sign is negative, no sign of DVT with DTRs 2+ bilaterally PAD: scant bright red bleeding on pad. Approximately the size of a dime Membranes:ruptured, clear fluid  Fetal Monitoring:  Baseline: 130 bpm, Variability: Fair (1-6 bpm), Accelerations: Reactive and Decelerations: Absent Toco: irregular contractions  Labs:  Results for orders placed or performed during the hospital encounter of 11/13/16 (from the past 24 hour(s))  Glucose, capillary   Collection Time: 12/17/16  9:51 AM  Result Value Ref Range   Glucose-Capillary 81 65 - 99 mg/dL   Comment 1 Notify RN   Glucose, capillary   Collection Time: 12/17/16 11:51 AM  Result Value Ref Range   Glucose-Capillary 102 (H) 65 - 99 mg/dL  Glucose, capillary   Collection Time: 12/17/16  9:54 PM  Result Value Ref Range   Glucose-Capillary 108 (H) 65 - 99 mg/dL    Imaging Studies:    Marland Kitchen.  Medications:  Scheduled . docusate sodium  100 mg Oral Daily  . famotidine  20 mg Oral BID  . ferrous sulfate  325 mg Oral BID WC  . insulin aspart  6 Units Subcutaneous TID WC  . insulin detemir  10 Units Subcutaneous QHS  . insulin detemir  15 Units Subcutaneous Daily  . nicotine  7 mg Transdermal Daily  . prenatal multivitamin  1 tablet Oral Q1200  . sodium chloride flush  3 mL Intravenous Q12H  . zolpidem  5 mg Oral QHS   I have reviewed the patient's current medications.  ASSESSMENT: Patient Active Problem List   Diagnosis Date Noted  . GDM (gestational diabetes mellitus) 11/28/2016  . Tobacco abuse 11/25/2016  . Rh negative state in antepartum period 11/25/2016  . Preterm premature rupture of membranes (PPROM) with unknown onset of labor 11/13/2016  . History of cesarean delivery 01/12/2016  . Polysubstance abuse 08/31/2015    PLAN: 1. PPROM  ? Beginnings of intrauterine inflammation or infection.   Check CBC  Will continue to monitor closely.  NST reactive x2  Last US on 2/22 - EFW 47% 2. GDM  CBG controlled.  On insulin. No hypoglycemic episodes.  inpt care til del a 34 wk or as clinical condition dictates Delivery at 34 wk or at s/s labor , infection  Rhona Raider Stinson 12/18/2016,7:50 AM

## 2016-12-19 LAB — GLUCOSE, CAPILLARY
GLUCOSE-CAPILLARY: 130 mg/dL — AB (ref 65–99)
GLUCOSE-CAPILLARY: 67 mg/dL (ref 65–99)
GLUCOSE-CAPILLARY: 72 mg/dL (ref 65–99)
Glucose-Capillary: 118 mg/dL — ABNORMAL HIGH (ref 65–99)

## 2016-12-19 NOTE — Progress Notes (Signed)
Patient ID: Laurie [name], female   DOB: [birth year], 21 y.o.   MRN: [id number] FACULTY PRACTICE ANTEPARTUM(COMPREHENSIVE) NOTE  Laurie [name] is a 21 y.o. G3P1011 at [redacted]w[redacted]d   who is admitted for PROM, a continued slow leak Fetal presentation is breech. Length of Stay:  36  Days  Subjective: No bleeding today. Leaks fluid intermittently, none for >24 hours Mild tenderness in Left fundus. No contractions, but mild cramping and nausea. Denies fevers, chills.  Patient reports the fetal movement as active. Patient reports uterine contraction  activity as none. Patient reports  vaginal bleeding as none. Patient describes fluid per vagina as Clear.  Vitals:  Blood pressure 113/61, pulse 96, temperature 98.8 F (37.1 C), temperature source Oral, resp. rate 16, height 5\' 3"  (1.6 m), weight 146 lb 6.4 oz (66.4 kg), last menstrual period 06/06/2016, SpO2 100 %, unknown if currently breastfeeding. Physical Examination:  General appearance - alert, well appearing, and in no distress, oriented to person, place, and time and normal appearing weight Heart - normal rate and regular rhythm Abdomen - soft, nondistended. Mild tenderness in left fundus Fundal Height:  size equals dates Cervical Exam: Not evaluated. and found to be / / and fetal presentation is breech. Extremities: extremities normal, atraumatic, no cyanosis or edema and Homans sign is negative, no sign of DVT with DTRs 2+ bilaterally PAD: scant bright red bleeding on pad. Approximately the size of a dime Membranes:ruptured, clear fluid  Fetal Monitoring:  Baseline: 130 bpm, Variability: Fair (1-6 bpm), Accelerations: Reactive and Decelerations: Absent Toco: irregular contractions  Labs:  Results for orders placed or performed during the hospital encounter of 11/13/16 (from the past 24 hour(s))  CBC   Collection Time: 12/18/16  7:57 AM  Result Value Ref Range   WBC 7.0 4.0 - 10.5 K/uL   RBC 3.72 (L) 3.87 - 5.11 MIL/uL   Hemoglobin 10.8  (L) 12.0 - 15.0 g/dL   HCT 40.9 (L) 81.1 - 91.4 %   MCV 86.3 78.0 - 100.0 fL   MCH 29.0 26.0 - 34.0 pg   MCHC 33.6 30.0 - 36.0 g/dL   RDW NOT CALCULATED 78.2 - 15.5 %   Platelets 122 (L) 150 - 400 K/uL  Type and screen [facility]   Collection Time: 12/18/16  7:57 AM  Result Value Ref Range   ABO/RH(D) A NEG    Antibody Screen POS    Sample Expiration 12/21/2016    Antibody Identification PASSIVELY ACQUIRED ANTI-D    DAT, IgG NEG    Unit Number [id number]    Blood Component Type RED CELLS,LR    Unit division 00    Status of Unit ALLOCATED    Transfusion Status OK TO TRANSFUSE    Crossmatch Result COMPATIBLE    Unit Number [id number]    Blood Component Type RED CELLS,LR    Unit division 00    Status of Unit ALLOCATED    Transfusion Status OK TO TRANSFUSE    Crossmatch Result COMPATIBLE   BPAM RBC   Collection Time: 12/18/16  7:57 AM  Result Value Ref Range   Blood Product Unit Number [id number]    Unit Type and Rh 0600    Blood Product Expiration Date 034742595638    Blood Product Unit Number [id number]    Unit Type and Rh 0600    Blood Product Expiration Date 166063016010   Glucose, capillary   Collection Time: 12/18/16  8:00 AM  Result Value Ref Range   Glucose-Capillary 91  65 - 99 mg/dL   Comment 1 Notify RN    Comment 2 Document in Chart   Glucose, capillary   Collection Time: 12/18/16  4:09 PM  Result Value Ref Range   Glucose-Capillary 104 (H) 65 - 99 mg/dL  Glucose, capillary   Collection Time: 12/18/16 10:12 PM  Result Value Ref Range   Glucose-Capillary 96 65 - 99 mg/dL  Glucose, capillary   Collection Time: 12/19/16  5:17 AM  Result Value Ref Range   Glucose-Capillary 130 (H) 65 - 99 mg/dL    Imaging Studies:    Marland Kitchen.  Medications:  Scheduled . docusate sodium  100 mg Oral Daily  . famotidine  20 mg Oral BID  . ferrous sulfate  325 mg Oral BID WC  . insulin aspart  6 Units Subcutaneous TID WC  . insulin detemir  10  Units Subcutaneous QHS  . insulin detemir  15 Units Subcutaneous Daily  . nicotine  7 mg Transdermal Daily  . prenatal multivitamin  1 tablet Oral Q1200  . sodium chloride flush  3 mL Intravenous Q12H  . zolpidem  5 mg Oral QHS   I have reviewed the patient's current medications.  ASSESSMENT: 5755w5d PPROM  Since 1/24, if makes it to 34 weeks, proceed with Caesarean section, breech presentation  Patient Active Problem List   Diagnosis Date Noted  . GDM (gestational diabetes mellitus) 11/28/2016  . Tobacco abuse 11/25/2016  . Rh negative state in antepartum period 11/25/2016  . Preterm premature rupture of membranes (PPROM) with unknown onset of labor 11/13/2016  . History of cesarean delivery 01/12/2016  . Polysubstance abuse 08/31/2015    PLAN: 1. PPROM  Check CBC  Will continue to monitor closely.  NST reactive x2  Last US on 2/22 - EFW 47% 2. GDM  CBG controlled.  On insulin. No hypoglycemic episodes.  inpt care til del a 34 wk or as clinical condition dictates Delivery at 34 wk or at s/s labor , infection  EURE,LUTHER H 12/19/2016,7:20 AM  Patient ID: Laurie BoozerSkylar E [name], female   DOB: 1996-06-25, 21 y.o.   MRN: [id number]

## 2016-12-20 ENCOUNTER — Encounter (HOSPITAL_COMMUNITY): Payer: Self-pay

## 2016-12-20 DIAGNOSIS — D696 Thrombocytopenia, unspecified: Secondary | ICD-10-CM | POA: Diagnosis present

## 2016-12-20 DIAGNOSIS — O99119 Other diseases of the blood and blood-forming organs and certain disorders involving the immune mechanism complicating pregnancy, unspecified trimester: Secondary | ICD-10-CM

## 2016-12-20 LAB — GLUCOSE, CAPILLARY
GLUCOSE-CAPILLARY: 82 mg/dL (ref 65–99)
GLUCOSE-CAPILLARY: 74 mg/dL (ref 65–99)
Glucose-Capillary: 137 mg/dL — ABNORMAL HIGH (ref 65–99)
Glucose-Capillary: 73 mg/dL (ref 65–99)

## 2016-12-20 NOTE — Progress Notes (Signed)
FACULTY PRACTICE ANTEPARTUM COMPREHENSIVE PROGRESS NOTE  Laurie [name] is a 21 y.o. G3P1011 at 7468w6d  who is admitted for PPROM  Fetal presentation is breech on 2/22 Length of Stay: HD#38  Subjective: No s/s of infection, PTL or decreased FM.   Vitals:  Blood pressure (!) 86/43, pulse 67, temperature 98.4 F (36.9 C), temperature source Oral, resp. rate 16, height 5\' 3"  (1.6 m), weight 66.4 kg (146 lb 6.4 oz), last menstrual period 06/06/2016, SpO2 99 %, unknown if currently breastfeeding.   Physical Examination: Gen NAD Lungs: no resp distress Abd soft, gravid, non tender Ext non tender, no c/c/e  Fetal Monitoring: 135 baseline, +accels, no decel, mod variability, toco quiet   Labs:  Results for orders placed or performed during the hospital encounter of 11/13/16 (from the past 24 hour(s))  Glucose, capillary   Collection Time: 12/19/16 12:33 PM  Result Value Ref Range   Glucose-Capillary 67 65 - 99 mg/dL   Comment 1 Notify RN    Comment 2 Document in Chart   Glucose, capillary   Collection Time: 12/19/16  6:17 PM  Result Value Ref Range   Glucose-Capillary 72 65 - 99 mg/dL   Comment 1 Notify RN    Comment 2 Document in Chart   Glucose, capillary   Collection Time: 12/19/16 10:18 PM  Result Value Ref Range   Glucose-Capillary 118 (H) 65 - 99 mg/dL  Glucose, capillary   Collection Time: 12/20/16  5:25 AM  Result Value Ref Range   Glucose-Capillary 73 65 - 99 mg/dL   CBC Latest Ref Rng & Units 12/18/2016 12/15/2016 12/12/2016  WBC 4.0 - 10.5 K/uL 7.0 7.7 7.3  Hemoglobin 12.0 - 15.0 g/dL 10.8(L) 10.6(L) 10.7(L)  Hematocrit 36.0 - 46.0 % 32.1(L) 31.8(L) 32.1(L)  Platelets 150 - 400 K/uL 122(L) 128(L) 125(L)   Imaging Studies:    2/22 1729gm, 47%, normal AC. AFI 3.33. Breech.  Medications:  Scheduled . docusate sodium  100 mg Oral Daily  . famotidine  20 mg Oral BID  . ferrous sulfate  325 mg Oral BID WC  . insulin aspart  6 Units Subcutaneous TID WC  . insulin  detemir  10 Units Subcutaneous QHS  . insulin detemir  15 Units Subcutaneous Daily  . nicotine  7 mg Transdermal Daily  . prenatal multivitamin  1 tablet Oral Q1200  . sodium chloride flush  3 mL Intravenous Q12H  . zolpidem  5 mg Oral QHS   I have reviewed the patient's current medications.  ASSESSMENT: Patient stable  PLAN: *Pregnancy: routine care. NST qday *PPROM:  -s/p rescue BMZ course 2/9-10 -s/p latency abx.  *Preterm: see above. S/p NICU consult already *GDMa2: continue current regimen  *PPx: SCDs, OOB ad lib *no PNC, h/o polysubstance abuse: negative UDS on admission. SW following *Hep C: normal CMP on 2/6 *+CT: tx on admission. TOC negative *h/o c-section: repeat for delivery, given malpresentation.  *anemia: Continue with po iron. Hct 32 on 2/22.  -s/p IV iron. 2/7 H/H 8.4 and 26.4 from 7.1 and 22.2 continue with PO iron *gestational thrombocytopenia: recheck next week.  *FEN/GI: see above. Seen by nutrition already *Dispo: standard indications, 34wks. Request sent for 3/10 rpt c-section.   Laurie Bingharlie Copelyn Widmer, MD 12/20/2016,11:15 AM

## 2016-12-21 ENCOUNTER — Inpatient Hospital Stay (HOSPITAL_COMMUNITY): Payer: 59 | Admitting: Anesthesiology

## 2016-12-21 ENCOUNTER — Encounter (HOSPITAL_COMMUNITY)
Admission: AD | Disposition: A | Discharge status: Home / Self Care | Payer: Self-pay | Source: Ambulatory Visit | Attending: Obstetrics & Gynecology

## 2016-12-21 ENCOUNTER — Encounter (HOSPITAL_COMMUNITY): Payer: Self-pay

## 2016-12-21 DIAGNOSIS — O34211 Maternal care for low transverse scar from previous cesarean delivery: Secondary | ICD-10-CM

## 2016-12-21 DIAGNOSIS — O24419 Gestational diabetes mellitus in pregnancy, unspecified control: Secondary | ICD-10-CM

## 2016-12-21 DIAGNOSIS — O4590 Premature separation of placenta, unspecified, unspecified trimester: Secondary | ICD-10-CM | POA: Diagnosis present

## 2016-12-21 DIAGNOSIS — Z8759 Personal history of other complications of pregnancy, childbirth and the puerperium: Secondary | ICD-10-CM

## 2016-12-21 DIAGNOSIS — O328XX0 Maternal care for other malpresentation of fetus, not applicable or unspecified: Secondary | ICD-10-CM

## 2016-12-21 DIAGNOSIS — O4593 Premature separation of placenta, unspecified, third trimester: Secondary | ICD-10-CM

## 2016-12-21 DIAGNOSIS — O42113 Preterm premature rupture of membranes, onset of labor more than 24 hours following rupture, third trimester: Secondary | ICD-10-CM

## 2016-12-21 DIAGNOSIS — Z3A33 33 weeks gestation of pregnancy: Secondary | ICD-10-CM

## 2016-12-21 LAB — GLUCOSE, CAPILLARY: Glucose-Capillary: 115 mg/dL — ABNORMAL HIGH (ref 65–99)

## 2016-12-21 LAB — TYPE AND SCREEN
ABO/RH(D): A NEG
ANTIBODY SCREEN: POSITIVE
DAT, IGG: NEGATIVE
Unit division: 0
Unit division: 0

## 2016-12-21 LAB — BPAM RBC
BLOOD PRODUCT EXPIRATION DATE: 201803222359
Blood Product Expiration Date: 201803152359
Unit Type and Rh: 600
Unit Type and Rh: 600

## 2016-12-21 LAB — CBC
HCT: 35.4 % — ABNORMAL LOW (ref 36.0–46.0)
Hemoglobin: 11.9 g/dL — ABNORMAL LOW (ref 12.0–15.0)
MCH: 29.7 pg (ref 26.0–34.0)
MCHC: 33.6 g/dL (ref 30.0–36.0)
MCV: 88.3 fL (ref 78.0–100.0)
PLATELETS: 139 10*3/uL — AB (ref 150–400)
RBC: 4.01 MIL/uL (ref 3.87–5.11)
WBC: 8.2 10*3/uL (ref 4.0–10.5)

## 2016-12-21 SURGERY — Surgical Case
Anesthesia: Spinal | Site: Abdomen

## 2016-12-21 MED ORDER — OXYCODONE-ACETAMINOPHEN 5-325 MG PO TABS
2.0000 | ORAL_TABLET | ORAL | Status: DC | PRN
  Administered 2016-12-22 – 2016-12-24 (×11): 2 via ORAL
  Filled 2016-12-21 (×13): qty 2

## 2016-12-21 MED ORDER — DIPHENHYDRAMINE HCL 50 MG/ML IJ SOLN
12.5000 mg | INTRAMUSCULAR | Status: DC | PRN
Start: 2016-12-21 — End: 2016-12-23
  Administered 2016-12-21: 12.5 mg via INTRAVENOUS

## 2016-12-21 MED ORDER — BUPIVACAINE IN DEXTROSE 0.75-8.25 % IT SOLN
INTRATHECAL | Status: DC | PRN
  Administered 2016-12-21: 1.5 mL via INTRATHECAL

## 2016-12-21 MED ORDER — NALBUPHINE HCL 10 MG/ML IJ SOLN
5.0000 mg | Freq: Once | INTRAMUSCULAR | Status: AC | PRN
  Administered 2016-12-21: 5 mg via INTRAVENOUS

## 2016-12-21 MED ORDER — FERROUS SULFATE 325 (65 FE) MG PO TABS
325.0000 mg | ORAL_TABLET | Freq: Two times a day (BID) | ORAL | Status: DC
  Administered 2016-12-21 – 2016-12-24 (×6): 325 mg via ORAL
  Filled 2016-12-21 (×7): qty 1

## 2016-12-21 MED ORDER — ALBUMIN HUMAN 5 % IV SOLN
INTRAVENOUS | Status: DC | PRN
  Administered 2016-12-21: 11:00:00 via INTRAVENOUS

## 2016-12-21 MED ORDER — DIPHENHYDRAMINE HCL 50 MG/ML IJ SOLN
INTRAMUSCULAR | Status: AC
  Administered 2016-12-21: 12.5 mg via INTRAVENOUS
  Filled 2016-12-21: qty 1

## 2016-12-21 MED ORDER — KETOROLAC TROMETHAMINE 30 MG/ML IJ SOLN: 30.0000 mg | Freq: Four times a day (QID) | INTRAMUSCULAR | Status: AC | PRN

## 2016-12-21 MED ORDER — DIPHENHYDRAMINE HCL 25 MG PO CAPS
25.0000 mg | ORAL_CAPSULE | ORAL | Status: DC | PRN
  Administered 2016-12-22 (×2): 25 mg via ORAL
  Filled 2016-12-21 (×2): qty 1

## 2016-12-21 MED ORDER — TETANUS-DIPHTH-ACELL PERTUSSIS 5-2.5-18.5 LF-MCG/0.5 IM SUSP: 0.5000 mL | Freq: Once | INTRAMUSCULAR | Status: DC

## 2016-12-21 MED ORDER — SODIUM CHLORIDE 0.9 % IR SOLN
Status: DC | PRN
  Administered 2016-12-21: 1

## 2016-12-21 MED ORDER — SCOPOLAMINE 1 MG/3DAYS TD PT72: 1.0000 | MEDICATED_PATCH | Freq: Once | TRANSDERMAL | Status: DC

## 2016-12-21 MED ORDER — ACETAMINOPHEN 325 MG PO TABS
650.0000 mg | ORAL_TABLET | ORAL | Status: DC | PRN
  Administered 2016-12-23: 650 mg via ORAL
  Filled 2016-12-21 (×2): qty 2

## 2016-12-21 MED ORDER — KETOROLAC TROMETHAMINE 30 MG/ML IJ SOLN
30.0000 mg | Freq: Four times a day (QID) | INTRAMUSCULAR | Status: AC | PRN
  Administered 2016-12-21: 30 mg via INTRAVENOUS
  Filled 2016-12-21: qty 1

## 2016-12-21 MED ORDER — OXYCODONE-ACETAMINOPHEN 5-325 MG PO TABS: 1.0000 | ORAL_TABLET | ORAL | Status: DC | PRN

## 2016-12-21 MED ORDER — MEASLES, MUMPS & RUBELLA VAC ~~LOC~~ INJ
0.5000 mL | INJECTION | Freq: Once | SUBCUTANEOUS | Status: DC
  Filled 2016-12-21: qty 0.5

## 2016-12-21 MED ORDER — NALOXONE HCL 2 MG/2ML IJ SOSY
1.0000 ug/kg/h | PREFILLED_SYRINGE | INTRAVENOUS | Status: DC | PRN
  Filled 2016-12-21: qty 2

## 2016-12-21 MED ORDER — DIPHENHYDRAMINE HCL 25 MG PO CAPS
25.0000 mg | ORAL_CAPSULE | Freq: Four times a day (QID) | ORAL | Status: DC | PRN
  Administered 2016-12-22: 25 mg via ORAL
  Filled 2016-12-21 (×2): qty 1

## 2016-12-21 MED ORDER — SENNOSIDES-DOCUSATE SODIUM 8.6-50 MG PO TABS
2.0000 | ORAL_TABLET | ORAL | Status: DC
  Administered 2016-12-22 – 2016-12-23 (×3): 2 via ORAL
  Filled 2016-12-21 (×3): qty 2

## 2016-12-21 MED ORDER — KETOROLAC TROMETHAMINE 30 MG/ML IJ SOLN
INTRAMUSCULAR | Status: AC
  Filled 2016-12-21: qty 1

## 2016-12-21 MED ORDER — FENTANYL CITRATE (PF) 100 MCG/2ML IJ SOLN
25.0000 ug | INTRAMUSCULAR | Status: DC | PRN
  Administered 2016-12-21: 25 ug via INTRAVENOUS

## 2016-12-21 MED ORDER — MORPHINE SULFATE (PF) 0.5 MG/ML IJ SOLN
INTRAMUSCULAR | Status: DC | PRN
  Administered 2016-12-21: .2 mg via EPIDURAL

## 2016-12-21 MED ORDER — SCOPOLAMINE 1 MG/3DAYS TD PT72
MEDICATED_PATCH | TRANSDERMAL | Status: DC | PRN
  Administered 2016-12-21: 1 via TRANSDERMAL

## 2016-12-21 MED ORDER — LACTATED RINGERS IV SOLN
INTRAVENOUS | Status: DC | PRN
  Administered 2016-12-21: 10:00:00 via INTRAVENOUS

## 2016-12-21 MED ORDER — ONDANSETRON HCL 4 MG/2ML IJ SOLN
INTRAMUSCULAR | Status: AC
  Filled 2016-12-21: qty 2

## 2016-12-21 MED ORDER — MAGNESIUM HYDROXIDE 400 MG/5ML PO SUSP: 30.0000 mL | ORAL | Status: DC | PRN

## 2016-12-21 MED ORDER — SOD CITRATE-CITRIC ACID 500-334 MG/5ML PO SOLN
30.0000 mL | ORAL | Status: DC
  Filled 2016-12-21: qty 15

## 2016-12-21 MED ORDER — WITCH HAZEL-GLYCERIN EX PADS: 1.0000 "application " | MEDICATED_PAD | CUTANEOUS | Status: DC | PRN

## 2016-12-21 MED ORDER — ALBUMIN HUMAN 5 % IV SOLN
INTRAVENOUS | Status: AC
  Filled 2016-12-21: qty 250

## 2016-12-21 MED ORDER — OXYTOCIN 40 UNITS IN LACTATED RINGERS INFUSION - SIMPLE MED
INTRAVENOUS | Status: DC | PRN
  Administered 2016-12-21: 40 [IU] via INTRAVENOUS

## 2016-12-21 MED ORDER — MENTHOL 3 MG MT LOZG: 1.0000 | LOZENGE | OROMUCOSAL | Status: DC | PRN

## 2016-12-21 MED ORDER — IBUPROFEN 600 MG PO TABS
600.0000 mg | ORAL_TABLET | Freq: Four times a day (QID) | ORAL | Status: DC
  Administered 2016-12-21 – 2016-12-24 (×10): 600 mg via ORAL
  Filled 2016-12-21 (×11): qty 1

## 2016-12-21 MED ORDER — MORPHINE SULFATE (PF) 0.5 MG/ML IJ SOLN
INTRAMUSCULAR | Status: AC
  Filled 2016-12-21: qty 10

## 2016-12-21 MED ORDER — LACTATED RINGERS IV SOLN
INTRAVENOUS | Status: DC
  Administered 2016-12-21: 20:00:00 via INTRAVENOUS

## 2016-12-21 MED ORDER — MEPERIDINE HCL 25 MG/ML IJ SOLN: 6.2500 mg | INTRAMUSCULAR | Status: DC | PRN

## 2016-12-21 MED ORDER — PHENYLEPHRINE HCL 10 MG/ML IJ SOLN
INTRAMUSCULAR | Status: DC | PRN
  Administered 2016-12-21: 80 ug via INTRAVENOUS

## 2016-12-21 MED ORDER — OXYTOCIN 40 UNITS IN LACTATED RINGERS INFUSION - SIMPLE MED: 2.5000 [IU]/h | INTRAVENOUS | Status: AC

## 2016-12-21 MED ORDER — NALOXONE HCL 0.4 MG/ML IJ SOLN: 0.4000 mg | INTRAMUSCULAR | Status: DC | PRN

## 2016-12-21 MED ORDER — OXYTOCIN 10 UNIT/ML IJ SOLN
INTRAMUSCULAR | Status: AC
  Filled 2016-12-21: qty 4

## 2016-12-21 MED ORDER — FENTANYL CITRATE (PF) 100 MCG/2ML IJ SOLN: 50.0000 ug | Freq: Once | INTRAMUSCULAR | Status: DC

## 2016-12-21 MED ORDER — FENTANYL CITRATE (PF) 100 MCG/2ML IJ SOLN
INTRAMUSCULAR | Status: AC
  Administered 2016-12-21: 25 ug via INTRAVENOUS
  Filled 2016-12-21: qty 2

## 2016-12-21 MED ORDER — PHENYLEPHRINE 8 MG IN D5W 100 ML (0.08MG/ML) PREMIX OPTIME
INJECTION | INTRAVENOUS | Status: AC
  Filled 2016-12-21: qty 100

## 2016-12-21 MED ORDER — SCOPOLAMINE 1 MG/3DAYS TD PT72
MEDICATED_PATCH | TRANSDERMAL | Status: AC
  Filled 2016-12-21: qty 1

## 2016-12-21 MED ORDER — SIMETHICONE 80 MG PO CHEW: 80.0000 mg | CHEWABLE_TABLET | ORAL | Status: DC | PRN

## 2016-12-21 MED ORDER — FENTANYL CITRATE (PF) 100 MCG/2ML IJ SOLN
INTRAMUSCULAR | Status: DC | PRN
  Administered 2016-12-21: 20 ug via INTRATHECAL

## 2016-12-21 MED ORDER — METOCLOPRAMIDE HCL 5 MG/ML IJ SOLN: 10.0000 mg | Freq: Once | INTRAMUSCULAR | Status: DC | PRN

## 2016-12-21 MED ORDER — SODIUM CHLORIDE 0.9% FLUSH: 3.0000 mL | INTRAVENOUS | Status: DC | PRN

## 2016-12-21 MED ORDER — PHENYLEPHRINE 8 MG IN D5W 100 ML (0.08MG/ML) PREMIX OPTIME
INJECTION | INTRAVENOUS | Status: DC | PRN
  Administered 2016-12-21: 60 ug/min via INTRAVENOUS

## 2016-12-21 MED ORDER — NALBUPHINE HCL 10 MG/ML IJ SOLN: 5.0000 mg | Freq: Once | INTRAMUSCULAR | Status: AC | PRN

## 2016-12-21 MED ORDER — BUPIVACAINE HCL (PF) 0.5 % IJ SOLN
INTRAMUSCULAR | Status: DC | PRN
Start: 2016-12-21 — End: 2016-12-21
  Administered 2016-12-21: 30 mL

## 2016-12-21 MED ORDER — ZOLPIDEM TARTRATE 5 MG PO TABS
5.0000 mg | ORAL_TABLET | Freq: Every evening | ORAL | Status: DC | PRN
  Administered 2016-12-22: 5 mg via ORAL
  Filled 2016-12-21: qty 1

## 2016-12-21 MED ORDER — PRENATAL MULTIVITAMIN CH
1.0000 | ORAL_TABLET | Freq: Every day | ORAL | Status: DC
  Administered 2016-12-22 – 2016-12-23 (×2): 1 via ORAL
  Filled 2016-12-21 (×2): qty 1

## 2016-12-21 MED ORDER — LACTATED RINGERS IV SOLN: INTRAVENOUS | Status: DC

## 2016-12-21 MED ORDER — FENTANYL CITRATE (PF) 100 MCG/2ML IJ SOLN
25.0000 ug | INTRAMUSCULAR | Status: DC | PRN
Start: 2016-12-21 — End: 2016-12-24
  Administered 2016-12-21: 25 ug via INTRAVENOUS
  Filled 2016-12-21: qty 2

## 2016-12-21 MED ORDER — LACTATED RINGERS IV SOLN
INTRAVENOUS | Status: DC | PRN
  Administered 2016-12-21 (×4): via INTRAVENOUS

## 2016-12-21 MED ORDER — NALBUPHINE HCL 10 MG/ML IJ SOLN
5.0000 mg | INTRAMUSCULAR | Status: DC | PRN
  Filled 2016-12-21: qty 1

## 2016-12-21 MED ORDER — CLINDAMYCIN PHOSPHATE 900 MG/50ML IV SOLN
900.0000 mg | INTRAVENOUS | Status: AC
  Administered 2016-12-21: 900 mg via INTRAVENOUS
  Filled 2016-12-21: qty 50

## 2016-12-21 MED ORDER — SIMETHICONE 80 MG PO CHEW
80.0000 mg | CHEWABLE_TABLET | ORAL | Status: DC
  Administered 2016-12-22 – 2016-12-23 (×3): 80 mg via ORAL
  Filled 2016-12-21 (×3): qty 1

## 2016-12-21 MED ORDER — ONDANSETRON HCL 4 MG/2ML IJ SOLN
INTRAMUSCULAR | Status: DC | PRN
  Administered 2016-12-21: 4 mg via INTRAVENOUS

## 2016-12-21 MED ORDER — BUPIVACAINE HCL (PF) 0.5 % IJ SOLN
INTRAMUSCULAR | Status: AC
  Filled 2016-12-21: qty 30

## 2016-12-21 MED ORDER — DIBUCAINE 1 % RE OINT: 1.0000 "application " | TOPICAL_OINTMENT | RECTAL | Status: DC | PRN

## 2016-12-21 MED ORDER — FENTANYL CITRATE (PF) 100 MCG/2ML IJ SOLN
INTRAMUSCULAR | Status: AC
  Filled 2016-12-21: qty 2

## 2016-12-21 MED ORDER — GENTAMICIN SULFATE 40 MG/ML IJ SOLN
5.0000 mg/kg | INTRAVENOUS | Status: AC
  Administered 2016-12-21: 290 mg via INTRAVENOUS
  Filled 2016-12-21: qty 7.25

## 2016-12-21 MED ORDER — LACTATED RINGERS IV BOLUS (SEPSIS)
500.0000 mL | Freq: Once | INTRAVENOUS | Status: AC
  Administered 2016-12-21: 500 mL via INTRAVENOUS

## 2016-12-21 MED ORDER — COCONUT OIL OIL: 1.0000 "application " | TOPICAL_OIL | Status: DC | PRN

## 2016-12-21 MED ORDER — PHENYLEPHRINE 40 MCG/ML (10ML) SYRINGE FOR IV PUSH (FOR BLOOD PRESSURE SUPPORT)
PREFILLED_SYRINGE | INTRAVENOUS | Status: AC
  Filled 2016-12-21: qty 10

## 2016-12-21 MED ORDER — NALBUPHINE HCL 10 MG/ML IJ SOLN: 5.0000 mg | INTRAMUSCULAR | Status: DC | PRN

## 2016-12-21 MED ORDER — ONDANSETRON HCL 4 MG/2ML IJ SOLN: 4.0000 mg | Freq: Three times a day (TID) | INTRAMUSCULAR | Status: DC | PRN

## 2016-12-21 SURGICAL SUPPLY — 39 items
APL SKNCLS STERI-STRIP NONHPOA (GAUZE/BANDAGES/DRESSINGS) ×1
BARRIER ADHS 3X4 INTERCEED (GAUZE/BANDAGES/DRESSINGS) IMPLANT
BENZOIN TINCTURE PRP APPL 2/3 (GAUZE/BANDAGES/DRESSINGS) ×2 IMPLANT
BRR ADH 4X3 ABS CNTRL BYND (GAUZE/BANDAGES/DRESSINGS)
CHLORAPREP W/TINT 26ML (MISCELLANEOUS) ×2 IMPLANT
CLAMP CORD UMBIL (MISCELLANEOUS) IMPLANT
CLOSURE STERI STRIP 1/2 X4 (GAUZE/BANDAGES/DRESSINGS) ×2 IMPLANT
CLOTH BEACON ORANGE TIMEOUT ST (SAFETY) ×2 IMPLANT
DRSG OPSITE POSTOP 4X10 (GAUZE/BANDAGES/DRESSINGS) ×2 IMPLANT
ELECT REM PT RETURN 9FT ADLT (ELECTROSURGICAL) ×2
ELECTRODE REM PT RTRN 9FT ADLT (ELECTROSURGICAL) ×1 IMPLANT
EXTRACTOR VACUUM KIWI (MISCELLANEOUS) IMPLANT
GLOVE BIO SURGEON STRL SZ 6.5 (GLOVE) ×2 IMPLANT
GLOVE BIOGEL PI IND STRL 7.0 (GLOVE) ×2 IMPLANT
GLOVE BIOGEL PI INDICATOR 7.0 (GLOVE) ×2
GOWN STRL REUS W/TWL LRG LVL3 (GOWN DISPOSABLE) ×4 IMPLANT
HEMOSTAT SURGICEL 4X8 (HEMOSTASIS) ×2 IMPLANT
KIT ABG SYR 3ML LUER SLIP (SYRINGE) IMPLANT
NEEDLE HYPO 22GX1.5 SAFETY (NEEDLE) IMPLANT
NEEDLE HYPO 25X5/8 SAFETYGLIDE (NEEDLE) IMPLANT
NS IRRIG 1000ML POUR BTL (IV SOLUTION) ×2 IMPLANT
PACK C SECTION WH (CUSTOM PROCEDURE TRAY) ×2 IMPLANT
PAD ABD 7.5X8 STRL (GAUZE/BANDAGES/DRESSINGS) ×2 IMPLANT
PAD OB MATERNITY 4.3X12.25 (PERSONAL CARE ITEMS) ×2 IMPLANT
PENCIL SMOKE EVAC W/HOLSTER (ELECTROSURGICAL) ×2 IMPLANT
RETRACTOR WND ALEXIS 25 LRG (MISCELLANEOUS) IMPLANT
RTRCTR WOUND ALEXIS 25CM LRG (MISCELLANEOUS)
SPONGE GAUZE 4X4 12PLY STER LF (GAUZE/BANDAGES/DRESSINGS) ×4 IMPLANT
SUT PLAIN 2 0 (SUTURE) ×2
SUT PLAIN ABS 2-0 CT1 27XMFL (SUTURE) ×1 IMPLANT
SUT VIC AB 0 CT1 36 (SUTURE) ×12 IMPLANT
SUT VIC AB 2-0 CT1 27 (SUTURE) ×2
SUT VIC AB 2-0 CT1 TAPERPNT 27 (SUTURE) ×1 IMPLANT
SUT VIC AB 3-0 CT1 27 (SUTURE) ×2
SUT VIC AB 3-0 CT1 TAPERPNT 27 (SUTURE) ×1 IMPLANT
SUT VIC AB 4-0 PS2 27 (SUTURE) ×2 IMPLANT
SYR CONTROL 10ML LL (SYRINGE) IMPLANT
TOWEL OR 17X24 6PK STRL BLUE (TOWEL DISPOSABLE) ×2 IMPLANT
TRAY FOLEY CATH SILVER 14FR (SET/KITS/TRAYS/PACK) IMPLANT

## 2016-12-21 NOTE — Transfer of Care (Signed)
Immediate Anesthesia Transfer of Care Note  Patient: Laurie [name]  Procedure(s) Performed: Procedure(s): REPEAT CESAREAN SECTION (N/A)  Patient Location: PACU  Anesthesia Type:Spinal  Level of Consciousness: awake, alert  and oriented  Airway & Oxygen Therapy: Patient Spontanous Breathing  Post-op Assessment: Report given to RN and Post -op Vital signs reviewed and stable  Post vital signs: Reviewed and stable  Last Vitals:  Vitals:   12/21/16 0620 12/21/16 0715  BP:    Pulse:    Resp: 18 18  Temp:      Last Pain:  Vitals:   12/21/16 0724  TempSrc:   PainSc: 0-No pain      Patients Stated Pain Goal: 0 (12/21/16 0724)  Complications: No apparent anesthesia complications

## 2016-12-21 NOTE — Progress Notes (Signed)
Dr. Macon LargeAnyanwu at bedside with patient and Mother discussing incision procedure and c-section procedure.

## 2016-12-21 NOTE — Anesthesia Preprocedure Evaluation (Signed)
Anesthesia Evaluation  Patient identified by MRN, date of birth, ID band Patient awake    Reviewed: Allergy & Precautions, NPO status , Patient's Chart, lab work & pertinent test results  Airway Mallampati: II  TM Distance: >3 FB Neck ROM: Full    Dental no notable dental hx.    Pulmonary asthma (as a child) , Current Smoker,    Pulmonary exam normal breath sounds clear to auscultation       Cardiovascular negative cardio ROS Normal cardiovascular exam Rhythm:Regular Rate:Normal     Neuro/Psych negative neurological ROS  negative psych ROS   GI/Hepatic negative GI ROS, (+)     substance abuse  marijuana use and IV drug use, Hepatitis -, C  Endo/Other  negative endocrine ROS  Renal/GU negative Renal ROS  negative genitourinary   Musculoskeletal negative musculoskeletal ROS (+)   Abdominal   Peds negative pediatric ROS (+)  Hematology negative hematology ROS (+)   Anesthesia Other Findings   Reproductive/Obstetrics negative OB ROS                             Anesthesia Physical Anesthesia Plan  ASA: II  Anesthesia Plan: Spinal   Post-op Pain Management:    Induction:   Airway Management Planned: Natural Airway  Additional Equipment:   Intra-op Plan:   Post-operative Plan:   Informed Consent: I have reviewed the patients History and Physical, chart, labs and discussed the procedure including the risks, benefits and alternatives for the proposed anesthesia with the patient or authorized representative who has indicated his/her understanding and acceptance.   Dental advisory given  Plan Discussed with: CRNA  Anesthesia Plan Comments:         Anesthesia Quick Evaluation

## 2016-12-21 NOTE — Anesthesia Procedure Notes (Signed)
Spinal  Patient location during procedure: OR Staffing Anesthesiologist: Tajae Rybicki Performed: anesthesiologist  Preanesthetic Checklist Completed: patient identified, site marked, surgical consent, pre-op evaluation, timeout performed, IV checked, risks and benefits discussed and monitors and equipment checked Spinal Block Patient position: sitting Prep: DuraPrep Patient monitoring: heart rate, continuous pulse ox and blood pressure Approach: midline Location: L3-4 Injection technique: single-shot Needle Needle type: Sprotte  Needle gauge: 24 G Needle length: 9 cm Additional Notes Expiration date of kit checked and confirmed. Patient tolerated procedure well, without complications.       

## 2016-12-21 NOTE — Lactation Note (Signed)
This note was copied from a baby's chart. Lactation Consultation Note  Patient Name: Laurie [name] Today's Date: 12/21/2016    Initial consult at 6hrs old. GA 33w. 3lbs 15.9oz. Infant in NICU r/t RDS. MOB taught hand expression with return demonstration. MOB was pumping when we entered the room #24 flanges with an appropriate fit using initiate setting on pump. MOB provided NICU breastfeeding book and educated on the importance of maintaining a pumping schedule to increase milk supply. Instructed to pump every 2hrs during the day and at least once during the night for a total of 8+ pumpings per day. MOB educated on how to use and clean pump. Instructed to use hands on pumping and hand expression with every pumping session. Worked with MOB on hand expression at the end of pumping and was able to express 5ml of colostrum. Curved-tip syringe and extra colostrum containers given. MOB was able to verbalize understanding via teach back. Has Medicaid but [facility] and [facility]. Anticipated d/c on Tuesday. In[facility]. MOB verbalized understanding.    [name] 12/21/2016, 5:07 PM

## 2016-12-21 NOTE — Progress Notes (Signed)
CTSP for VB. Approximately 25mL of serosang discharge seen on the floor of the bathroom and on pad. Patient states went up to use the bathroom and felt LOF/VB; no real pain and not bleeding currently.  Fetus category I with accels and one UC seen and has been on for about 1630m. Will leave on EFM, NPO. Type and screen UTD and VS normal and stable. Possibly related to laying down o/n and rising for the first time. CBC ordered and will reassess in approx one hour.   Cornelia Copaharlie Wyonia Fontanella, Jr MD Attending Center [facility] (Faculty Practice) 12/21/2016 Time: [phone]   OB note Fetus category I with accels and rare UCs. Mom w/o pain and VS normal and stable. Pad that she's had on since this started with about 25mL of old blood on it. SSE shows visually closed cervix and approx 25mL of old blood in the vault.doesn't appear to be any active bleeding. SVE closed/long/high. Has been NPO since before MN. Given GA, VB, and mom and fetus look fine now, recommend proceeding with rpt c/s this morning, which she is amenable to. OR called. CBC just drawn and AM CBG pending. Can proceed when CBC is back (T&S UTD) and when OR is ready.   Cornelia Copaharlie Quisha Mabie, Jr MD Attending [facility] 12/21/2016 Time: [phone]

## 2016-12-21 NOTE — Op Note (Signed)
Laurie [name] PROCEDURE DATE: 12/21/2016  PREOPERATIVE DIAGNOSES: Intrauterine pregnancy at [redacted]w[redacted]d weeks gestation; PPROM since 11/11/2016; placental abruption with significant bleeding episodie; previous cesarean section; breech presentation of fetus  POSTOPERATIVE DIAGNOSES: The same  PROCEDURE: Repeat Low Transverse Cesarean Section  SURGEON:  Dr. Jaynie Collins  ANESTHESIOLOGIST: Dr. Saddie Benders  INDICATIONS: Laurie [name] is a 21 y.o. [id number] at [redacted]w[redacted]d here for repeat cesarean section secondary to the indications listed under preoperative diagnoses; please see preoperative note for further details.  The risks of cesarean section were discussed with the patient including but were not limited to: bleeding which may require transfusion or reoperation; infection which may require antibiotics; injury to bowel, bladder, ureters or other surrounding organs; injury to the fetus; need for additional procedures including hysterctomy in the event of a life-threatening hemorrhage; placental abnormalities wth subsequent pregnancies, incisional problems, thromboembolic phenomenon and other postoperative/anesthesia complications.   The patient concurred with the proposed plan, giving informed written consent for the procedure.    FINDINGS:  Viable female infant in breech presentation.  Apgars and weight pending at time of note; refer to delivery summary. Blood clots and bloody fluid noted in amniotic cavity.  Large amount of fresh red blood after delivery of placenta ~800 ml.  Intact placenta, three vessel cord.  Normal uterus, fallopian tubes and ovaries bilaterally.  Minimal intraperitoneal adhesive disease; just involving bladder being adherent higher on the lower uterine segment than usual.   ANESTHESIA: Spinal INTRAVENOUS FLUIDS: 3000 ml of crystalloid and 250 ml of albumin ESTIMATED BLOOD LOSS: 1500 ml (800 ml from abruption; 700 ml from surgery) SPECIMENS: Placenta sent to pathology COMPLICATIONS: None  immediate  PROCEDURE IN DETAIL:  The patient preoperatively received intravenous antibiotics and had sequential compression devices applied to her lower extremities.  She was then taken to the operating room where spinal anesthesia was administered and was found to be adequate. She was then placed in a dorsal supine position with a leftward tilt, and prepped and draped in a sterile manner.  A foley catheter was placed into her bladder and attached to constant gravity.  After an adequate timeout was performed, a Pfannenstiel skin incision was made with scalpel over her preexisting scarand carried through to the underlying layer of fascia. The fascia was incised in the midline, and this incision was extended bilaterally using the Mayo scissors.  Kocher clamps were applied to the superior aspect of the fascial incision and the underlying rectus muscles were dissected off sharply.  A similar process was carried out on the inferior aspect of the fascial incision. The rectus muscles were separated in the midline bluntly and the peritoneum was entered bluntly. Attention was turned to the lower uterine segment where a low transverse hysterotomy was made with a scalpel and extended bilaterally bluntly.  The infant was successfully delivered, the cord was clamped and cut after one minute, and the infant was handed over to the awaiting neonatology team. The placenta was delivered, it was intact with a three-vessel cord. The uterus was then cleared of clots and debris.  The hysterotomy was closed with 0 Vicryl in a running locked fashion, and an imbricating layer was also placed with 0 Vicryl.  Figure-of-eight 0 Vicryl serosal stitches were placed to help with hemostasis.  The pelvis was cleared of all clot and debris. Hemostasis was confirmed on all surfaces.  The peritoneum was closed with a 0 Vicryl running stitch. The fascia was then closed using 0 Vicryl in a running fashion.  The subcutaneous layer was irrigated, then  reapproximated with 2-0 plain gut interrupted stitches, and 30 ml of 0.5% Marcaine was injected subcutaneously around the incision.  The skin was closed with a 4-0 Vicryl subcuticular stitch. The patient tolerated the procedure well. Sponge, lap, instrument and needle counts were correct x 3.  She was taken to the recovery room in stable condition.    Jaynie CollinsUGONNA  ANYANWU, MD, FACOG Attending Obstetrician & Gynecologist Faculty Practice, [facility] [place]

## 2016-12-21 NOTE — Progress Notes (Signed)
Faculty Practice OB/GYN Attending Note   21 y.o. G3P1011 at 5823w0d admitted for PPROM now complicated by worsening vaginal bleeding due to chronic abruption.  Reassuring FHT for now.  Given increasing bleeding, the plan was made to proceed with delivery.  Given previous cesarean section, fetal breech presentation, repeat cesarean delivery recommended.  The risks of cesarean section discussed with the patient included but were not limited to: bleeding which may require transfusion or reoperation; infection which may require antibiotics; injury to bowel, bladder, ureters or other surrounding organs; injury to the fetus; need for additional procedures including hysterectomy in the event of a life-threatening hemorrhage; placental abnormalities wth subsequent pregnancies, incisional problems, thromboembolic phenomenon and other postoperative/anesthesia complications. The patient concurred with the proposed plan, giving informed written consent for the procedure.   Patient has been NPO since midnight she will remain NPO for procedure. Anesthesia and OR aware. Preoperative prophylactic antibiotics and SCDs ordered on call to the OR.  To OR when ready.   Jaynie CollinsUGONNA  Shary Lamos, MD, FACOG Attending Obstetrician & Gynecologist Faculty Practice, [facility] [place]

## 2016-12-21 NOTE — Lactation Note (Signed)
This note was copied from a baby's chart. Lactation Consultation Note  Patient Name: Laurie [name] Today's Date: 12/21/2016 Reason for consult: Initial assessment;NICU baby   Initial consult with mom at 366 hrs old.  Pumping was initiated at 5 hrs.   I agree with Verner CholAshton Merritt, RN, MSN-Student's documentation and was present throughout consult.   Maternal Data Has patient been taught Hand Expression?: Yes (with return demonstration and observation of colostrum easily expressed) Does the patient have breastfeeding experience prior to this delivery?: No  Feeding    LATCH Score/Interventions                      Lactation Tools Discussed/Used Pump Review: Setup, frequency, and cleaning;Milk Storage Initiated by:: Burna SisS. Aviyah Swetz, RN, IBCLC Date initiated:: 12/21/16   Consult Status Consult Status: Follow-up Date: 12/22/16 Follow-up type: In-patient    Lendon KaVann, Hillman Attig Walker 12/21/2016, 5:07 PM

## 2016-12-22 DIAGNOSIS — D62 Acute posthemorrhagic anemia: Secondary | ICD-10-CM | POA: Diagnosis not present

## 2016-12-22 LAB — CBC
HCT: 26.1 % — ABNORMAL LOW (ref 36.0–46.0)
HEMOGLOBIN: 8.4 g/dL — AB (ref 12.0–15.0)
MCH: 29.2 pg (ref 26.0–34.0)
MCHC: 32.2 g/dL (ref 30.0–36.0)
MCV: 90.6 fL (ref 78.0–100.0)
Platelets: 110 10*3/uL — ABNORMAL LOW (ref 150–400)
RBC: 2.88 MIL/uL — AB (ref 3.87–5.11)
WBC: 6.8 10*3/uL (ref 4.0–10.5)

## 2016-12-22 LAB — GLUCOSE, CAPILLARY: GLUCOSE-CAPILLARY: 88 mg/dL (ref 65–99)

## 2016-12-22 MED ORDER — DOCUSATE SODIUM 100 MG PO CAPS: 100.0000 mg | ORAL_CAPSULE | Freq: Two times a day (BID) | ORAL | Status: DC | PRN

## 2016-12-22 NOTE — Progress Notes (Signed)
Subjective: Postpartum Day 1: Cesarean Delivery at 1562w0d for Abruption in the setting of PPROM Patient reports tolerating PO and no problems voiding.  Ambulating, eating. Pain controlled on oral analgesia. No flatus yet.  Baby doing well in NICU. No presyncopal symptoms.  Objective: Vital signs in last 24 hours: Temp:  [97.1 F (36.2 C)-98.8 F (37.1 C)] 98.5 F (36.9 C) (03/04 0340) Pulse Rate:  [63-102] 69 (03/04 0340) Resp:  [10-26] 18 (03/04 0624) BP: (80-112)/(32-83) 97/45 (03/04 0340) SpO2:  [87 %-100 %] 100 % (03/03 2309)  Physical Exam:  General: alert and no distress Lochia: appropriate Uterine Fundus: firm Incision: healing well, no significant drainage DVT Evaluation: No evidence of DVT seen on physical exam. Negative Homan's sign. No cords or calf tenderness. No significant calf/ankle edema.   Recent Labs  12/21/16 0654 12/22/16 0531  HGB 11.9* 8.4*  HCT 35.4* 26.1*    Assessment/Plan: Status post Cesarean section. Doing well postoperatively.  Has postoperative anemia secondary to acute blood loss during abruption and surgery; asymptomatic.  Oral iron therapy ordered. Analegsia as needed. Continue current care, plan to discharge on POD#3 as per patient preference as long as she remains stable.  Jaynie CollinsUgonna Tashauna Caisse, MD 12/22/2016, 9:18 AM

## 2016-12-22 NOTE — Progress Notes (Signed)
Removed large pressure dressing from LT Abd. Incision.  Honey dressing was removed along with large pressure dressing.  Old dried blood noted to steri-strips.  Replaced 1/2" and placed a new honey dressing over incision.  Pt. Tolerated well.

## 2016-12-22 NOTE — Lactation Note (Signed)
This note was copied from a baby's chart. Lactation Consultation Note  Patient Name: Laurie [name] Today's Date: 12/22/2016 Reason for consult: Follow-up assessment;NICU baby;Infant < 6lbs   Spoke with mother in NICU at infant bedside. Mom reports she pumped and obtained 5 cc colostrum and did not take to NICU and did not refrigerate for an extended period of time. Mom reports she was told to pump 3 x a day. Discussed with mom importance of pumping every 2-3 hours with DEBP followed by hand expression, mom voiced understanding. Mom without questions/concerns.    Maternal Data Has patient been taught Hand Expression?: Yes  Feeding    LATCH Score/Interventions                      Lactation Tools Discussed/Used Pump Review: Setup, frequency, and cleaning Initiated by:: Reviewed   Consult Status Consult Status: Follow-up Date: 12/23/16 Follow-up type: In-patient    [name] 12/22/2016, 4:40 PM

## 2016-12-22 NOTE — Anesthesia Postprocedure Evaluation (Signed)
Anesthesia Post Note  Patient: Laurie [name]  Procedure(s) Performed: Procedure(s) (LRB): REPEAT CESAREAN SECTION (N/A)  Patient location during evaluation: [facility] Anesthesia Type: Spinal Level of consciousness: oriented and awake and alert Pain management: pain level controlled Vital Signs Assessment: post-procedure vital signs reviewed and stable Respiratory status: spontaneous breathing, respiratory function stable and patient connected to nasal cannula oxygen Cardiovascular status: blood pressure returned to baseline and stable Postop Assessment: no headache and no backache Anesthetic complications: no        Last Vitals:  Vitals:   12/22/16 0624 12/22/16 0925  BP:  (!) 93/49  Pulse:  70  Resp: 18 18  Temp:  37.2 C    Last Pain:  Vitals:   12/22/16 0943  TempSrc:   PainSc: 8    Pain Goal: Patients Stated Pain Goal: 5 (pt will wait for next dose of Tylox) (12/22/16 0943)               Marrion CoyMERRITT,[name]

## 2016-12-22 NOTE — Anesthesia Postprocedure Evaluation (Signed)
Anesthesia Post Note  Patient: Laurie [name]  Procedure(s) Performed: Procedure(s) (LRB): REPEAT CESAREAN SECTION (N/A)  Patient location during evaluation: [facility] Anesthesia Type: Spinal Level of consciousness: awake and alert Pain management: pain level controlled Vital Signs Assessment: post-procedure vital signs reviewed and stable Respiratory status: spontaneous breathing, nonlabored ventilation, respiratory function stable and patient connected to nasal cannula oxygen Cardiovascular status: blood pressure returned to baseline and stable Postop Assessment: no signs of nausea or vomiting Anesthetic complications: no        Last Vitals:  Vitals:   12/22/16 0624 12/22/16 0925  BP:  (!) 93/49  Pulse:  70  Resp: 18 18  Temp:  37.2 C    Last Pain:  Vitals:   12/22/16 0943  TempSrc:   PainSc: 8    Pain Goal: Patients Stated Pain Goal: 5 (pt will wait for next dose of Tylox) (12/22/16 0943)               Marrion CoyMERRITT,Connie Lasater

## 2016-12-23 ENCOUNTER — Other Ambulatory Visit: Payer: Self-pay | Admitting: Obstetrics & Gynecology

## 2016-12-23 ENCOUNTER — Telehealth: Payer: Self-pay | Admitting: *Deleted

## 2016-12-23 LAB — GLUCOSE, CAPILLARY: Glucose-Capillary: 70 mg/dL (ref 65–99)

## 2016-12-23 MED ORDER — ONDANSETRON 4 MG PO TBDP
4.0000 mg | ORAL_TABLET | Freq: Three times a day (TID) | ORAL | Status: DC | PRN
  Administered 2016-12-23 – 2016-12-24 (×4): 4 mg via ORAL
  Filled 2016-12-23 (×5): qty 1

## 2016-12-23 MED ORDER — RHO D IMMUNE GLOBULIN 1500 UNIT/2ML IJ SOSY
300.0000 ug | PREFILLED_SYRINGE | Freq: Once | INTRAMUSCULAR | Status: AC
  Administered 2016-12-23: 300 ug via INTRAMUSCULAR
  Filled 2016-12-23: qty 2

## 2016-12-23 NOTE — Progress Notes (Signed)
Patient re-educated about breast pumping. Instructions given again about pump and supplies. Pt attempted to pump at this time. Carmelina DaneERRI L Alisi Lupien, RN

## 2016-12-23 NOTE — Lactation Note (Signed)
This note was copied from a baby's chart. Lactation Consultation Note  Patient Name: Laurie [name] Today's Date: 12/23/2016 Reason for consult: Follow-up assessment;NICU baby;Infant < 6lbs   Spoke with mom at infant bedside in NICU. Mom reports she is not pumping and does not have milk. Discussed supply and demand and importance of frequent breast stimulation for milk production. Enc mom to pump every 2-3 hour to stimulate supply. Mom said ok. Mom has a manual pump at home for use. She [facility]. Enc mom to call with any questions/concerns prn.    Maternal Data    Feeding Feeding Type: Donor Breast Milk Length of feed: 30 min  LATCH Score/Interventions                      Lactation Tools Discussed/Used     Consult Status Consult Status: Follow-up Date: 12/24/16 Follow-up type: In-patient    Silas FloodSharon S Hice 12/23/2016, 10:11 AM

## 2016-12-23 NOTE — Progress Notes (Signed)
CSW received call from Jerry Ufot/CPS worker stating he has been assigned this case and plans to come to the hospital this afternoon to meet with MOB.  CSW notified staff. 

## 2016-12-23 NOTE — Progress Notes (Signed)
CSW received call from MGM/Crystal Brillhart who states she wants to know why CSW is involved and ask about CPS involvement that her daughter informed her of.  CSW explained that unless MOB gives permission to speak with MGM, CSW cannot speak with her.  She replied, "I have custody of the baby."  CSW explained the need for legal documentation noting change in custody any time this is the case.  MGM states she will bring up the documents now.  CSW asked that she give them to the secretary and ask to have copies made for CSW.  MGM was pleasant and agreed, thanking CSW.  CSW told MGM that once these papers are obtained, CSW can speak openly with MGM about the baby and his medical care. 

## 2016-12-23 NOTE — Progress Notes (Signed)
Subjective: Postpartum Day 2: Cesarean Delivery  Pt is without complaints this morning except some dysuria since foley was removed. She is ambulating and voiding without problems. Tolerating diet. Pain controlled. Breast pumping. Reports infant is stable in NICU   Objective: Vital signs in last 24 hours: Temp:  [97.5 F (36.4 C)-99 F (37.2 C)] 97.5 F (36.4 C) (03/05 0756) Pulse Rate:  [63-82] 72 (03/05 0756) Resp:  [18-20] 18 (03/05 0756) BP: (93-114)/(47-57) 111/49 (03/05 0756) SpO2:  [99 %-100 %] 100 % (03/05 0756)  Physical Exam:  Lungs clear Heart RRR Abd soft + BS U-2 drsg intact GU nl lochia Ext non tender  Recent Labs  12/21/16 0654 12/22/16 0531  HGB 11.9* 8.4*  HCT 35.4* 26.1*    Assessment/Plan: POD # 2 LTCS  Stable. Continue with progressive care. Plan discharge to home in [place].   Hermina StaggersMichael L Hazeline Charnley  12/23/2016, 9:00 AM

## 2016-12-23 NOTE — Telephone Encounter (Signed)
Called pt to set up post op appointment and postpartum appointment but patient declined and said that her baby was still in the NICU and she would not be able to see us anytime soon.  She also declined and said that she was going to be moving and would have to transfer her OB/GYN care.   12-23-16  AS

## 2016-12-23 NOTE — Clinical Social Work Maternal (Signed)
CLINICAL SOCIAL WORK MATERNAL/CHILD NOTE  Patient Details  Name: Laurie [name] MRN: [id number] Date of Birth: 1996/09/15  Date:  12/23/2016  Clinical Social Worker Initiating Note:  Terri Piedra, LCSW Date/ Time Initiated:  12/23/16/1130     Child's Name:  unnamed at this time.  MOB is thinking about naming baby Laurie [name].   Legal Guardian:  Mother Insurance underwriter [employer].  MOB is unsure that FOB will be on the birth certificate and would not give CSW FOB's name.)   Need for Interpreter:  None   Date of Referral:  12/23/16     Reason for Referral:  Current Substance Use/Substance Use During Pregnancy    Referral Source:  NICU   Address:  MOB states she lives with her grandmother at [address] [place], [place] [address].  MOB states her mother has custody of newborn and resides at [address] [place],  [address].  Phone number:  [phone]   Household Members:  Relatives   Natural Supports (not living in the home):  Immediate Family   Professional Supports: None   Employment:     Type of Work:     Education:      Pensions consultant:  Multimedia programmer (MOB's facesheet notes that she is on her Engineer, technical sales.  CSW does not know what type of insurance baby will have.)   Other Resources:      Cultural/Religious Considerations Which May Impact Care: None stated.  MOB's facesheet notes religion as [place].  Strengths:  Pediatrician chosen , Home prepared for child  (MOB thinks baby will have follow up with Dr. Michel Bickers, but states, "it's up to my mom.")   Risk Factors/Current Problems:  DHHS Involvement  (MOB reports no concerns but was extremely guarded.  CSW is aware that she had a recent open foster care case with her first child.)   Cognitive State:  Alert , Able to Concentrate    Mood/Affect:  Apprehensive , Calm    CSW Assessment: CSW met with MOB in her third floor room/315 to offer support and complete assessment due to baby's admission  to NICU and hx of substance use noted in MOB's chart.   MOB was extremely guarded and initially unsure if she wanted to speak with CSW.  CSW had this same experience on first attempt to meet with patient when she was here on bed rest.  MOB agreed for CSW to enter the room and hesitantly talked with CSW.  She reports that she has had "bad experiences with social workers."  She states that relinquished her parental rights on other child (which CSW was already aware of.  This happened while a patient in the hospital on 12/04/16) and decided to allow her baby's foster family to adopt her baby because "the plan was taking too long and she was settled there."  CSW informed MOB that CSW must notify CPS that MOB has delivered another baby given this recent history of a child in custody.  MOB informed CSW that CPS will not be involved this time because her mother/[name] has gotten an attorney and has custody of her infant.  CSW asked that MOB provide legal paperwork for baby's record so that the hospital knows who can legally make medical decisions for baby and who baby will discharge to.  MOB informed CSW that her mother won't want to talk with CSW.  CSW explained that this is fine, but the legal documentation will still be needed.  CSW asked MOB if  her mother has her first child and MOB reports that her daughter was removed from her (MOB's) care at 2 months because she "co-slept with baby."  She states her daughter went to her mother's care, but was removed from her mother because her mother allowed her father (her parents are not together) to care for the child and "this was not approved by CPS."  CSW told MOB that CPS may not get involved since she has made arrangements for her mother to have custody of the baby, but that this would be between CPS and her mother/mother's attorney.   MOB states her mother has everything she needs to care for infant at home.  She reports she thinks baby will have follow up with Dr.  Michel Bickers, but that "it will be up to my mom."  MOB did not wish to give CSW FOB's name and reports that they have a good friendship, but are not in a relationship.  She reports that he now lives in [place].  MOB did not wish to speak with CSW further and CSW respects this.   CSW made report to Child Protective Services due to recent history of child in custody.  CSW informed CPS intake worker that per MOB, her mother has custody of the child, but that CSW does not have any legal paperwork at this time. CSW will continue to follow. CSW notified OB High Risk staff that MOB is vaping in her room.   CSW Plan/Description:  Child Protective Service Report , Psychosocial Support and Ongoing Assessment of Needs    Alphonzo Cruise, [place] 12/23/2016, 1:19 PM

## 2016-12-24 LAB — RH IG WORKUP (INCLUDES ABO/RH)
ABO/RH(D): A NEG
ANTIBODY SCREEN: POSITIVE
DAT, IGG: NEGATIVE
Fetal Screen: NEGATIVE
Gestational Age(Wks): 33
UNIT DIVISION: 0

## 2016-12-24 MED ORDER — OXYCODONE-ACETAMINOPHEN 5-325 MG PO TABS: 1.0000 | ORAL_TABLET | ORAL | 0 refills | Status: DC | PRN

## 2016-12-24 MED ORDER — IBUPROFEN 600 MG PO TABS: 600.0000 mg | ORAL_TABLET | Freq: Four times a day (QID) | ORAL | 0 refills | Status: DC

## 2016-12-24 NOTE — Discharge Instructions (Signed)

## 2016-12-24 NOTE — Discharge Summary (Signed)
OB Discharge Summary     Patient Name: Laurie [name] DOB: Jan 24, 1996 MRN: [id number]  Date of admission: 11/13/2016 Delivering MD: Jaynie Collins A   Date of discharge: 12/24/2016  Admitting diagnosis: 27w heavy bleeding, maybe water leakage RCS Intrauterine pregnancy: [redacted]w[redacted]d     Secondary diagnosis:  Principal Problem:   Postoperative anemia due to acute blood loss Active Problems:   Polysubstance abuse   S/P preterm cesarean section for PPROM and abruption   Preterm premature rupture of membranes (PPROM) with unknown onset of labor   Tobacco abuse   Rh negative state in antepartum period   GDM (gestational diabetes mellitus)   Gestational thrombocytopenia (HCC)   Placental abruption  Additional problems: Placental Abruption                                      Breech presentation     Discharge diagnosis: Preterm Pregnancy Delivered                                                                                                Post partum procedures:none  Augmentation: none  Complications: ROM>24 hours  Hospital course:  Sceduled C/S   21 y.o. yo [id number] at [redacted]w[redacted]d was admitted to the hospital 11/13/2016 for scheduled cesarean section with the following indication:Malpresentation and abruption.  Membrane Rupture Time/Date: 10:21 AM ,11/11/2016   Patient delivered a Viable infant.12/21/2016  Details of operation can be found in separate operative note.  [name] had an uncomplicated postpartum course.  She is ambulating, tolerating a regular diet, passing flatus, and urinating well. Patient is discharged home in stable condition on  12/24/16         Physical exam  Vitals:   12/23/16 1245 12/23/16 1539 12/23/16 2010 12/24/16 0100  BP: (!) 102/41 (!) 106/47 (!) 103/54 (!) 113/48  Pulse: 62 71 62 95  Resp: 18 18 19 16   Temp: 98.5 F (36.9 C) 97.9 F (36.6 C) 97.6 F (36.4 C) 98.2 F (36.8 C)  TempSrc: Oral Oral Oral Oral  SpO2: 99% 100% 100% 100%  Weight:      Height:        General: alert, cooperative and no distress Lochia: appropriate Uterine Fundus: firm Incision: Healing well with no significant drainage, No significant erythema, Dressing is clean, dry, and intact DVT Evaluation: No evidence of DVT seen on physical exam. Labs: Lab Results  Component Value Date   WBC 6.8 12/22/2016   HGB 8.4 (L) 12/22/2016   HCT 26.1 (L) 12/22/2016   MCV 90.6 12/22/2016   PLT 110 (L) 12/22/2016   CMP Latest Ref Rng & Units 11/26/2016  Glucose 65 - 99 mg/dL 147(W)  BUN 6 - 20 mg/dL 15  Creatinine 2.95 - 6.21 mg/dL 3.08  Sodium 657 - 846 mmol/L 135  Potassium 3.5 - 5.1 mmol/L 4.1  Chloride 101 - 111 mmol/L 106  CO2 22 - 32 mmol/L 23  Calcium 8.9 - 10.3 mg/dL 9.6(E)  Total Protein 6.5 - 8.1 g/dL 6.3(L)  Total  Bilirubin 0.3 - 1.2 mg/dL 0.6  Alkaline Phos 38 - 126 U/L 93  AST 15 - 41 U/L 28  ALT 14 - 54 U/L 31    Discharge instruction: per After Visit Summary and "Baby and Me Booklet".  After visit meds:  Allergies as of 12/24/2016      Reactions   Penicillins Other (See Comments)   Results thru an allergy skin test Has patient had a PCN reaction causing immediate rash, facial/tongue/throat swelling, SOB or lightheadedness with hypotension: No Has patient had a PCN reaction causing severe rash involving mucus membranes or skin necrosis: No Has patient had a PCN reaction that required hospitalization No Has patient had a PCN reaction occurring within the last 10 years: No If all of the above answers are "NO", then may proceed with Cephalosporin use.      Medication List    TAKE these medications   ibuprofen 600 MG tablet Commonly known as:  ADVIL,MOTRIN Take 1 tablet (600 mg total) by mouth every 6 (six) hours.   oxyCODONE-acetaminophen 5-325 MG tablet Commonly known as:  PERCOCET/ROXICET Take 1 tablet by mouth every 4 (four) hours as needed (pain scale > 7).       Diet: routine diet  Activity: Advance as tolerated. Pelvic rest for 6 weeks.    Outpatient follow up:4 weeks Follow up Appt:No future appointments. Follow up Visit:No Follow-up on file.  Postpartum contraception: Depo Provera  Newborn Data: Live born female  Birth Weight: 3 lb 15.9 oz (1810 g) APGAR: 6, 8  Baby Feeding: Breast Disposition:NICU   12/24/2016 Wynelle BourgeoisMarie Takeshi Teasdale, CNM

## 2017-02-18 ENCOUNTER — Ambulatory Visit: Payer: 59 | Admitting: Women's Health

## 2017-02-27 ENCOUNTER — Ambulatory Visit: Payer: 59 | Admitting: Women's Health

## 2017-03-05 ENCOUNTER — Ambulatory Visit: Payer: 59 | Admitting: Women's Health

## 2017-05-01 NOTE — Addendum Note (Signed)
Addendum  created 05/01/17 1408 by Phillips Groutarignan, Jaryah Aracena, MD   Sign clinical note

## 2017-05-01 NOTE — Anesthesia Postprocedure Evaluation (Signed)
Anesthesia Post Note  Patient: Laurie [name]  Procedure(s) Performed: Procedure(s) (LRB): REPEAT CESAREAN SECTION (N/A)     Anesthesia Post Evaluation  Last Vitals:  Vitals:   12/24/16 0100 12/24/16 0809  BP: (!) 113/48 111/62  Pulse: 95 77  Resp: 16 16  Temp: 36.8 C 36.8 C    Last Pain:  Vitals:   12/24/16 0809  TempSrc: Oral  PainSc:                  Phillips Groutarignan, Aymee Fomby

## 2017-07-15 IMAGING — US US MFM OB TRANSVAGINAL
1 series · 15 of 28 positions shown · non-contrast
Comparison: none

[Series 1: us mfm ob transvaginal · 15 of 35 slices shown]
[im 1/35]
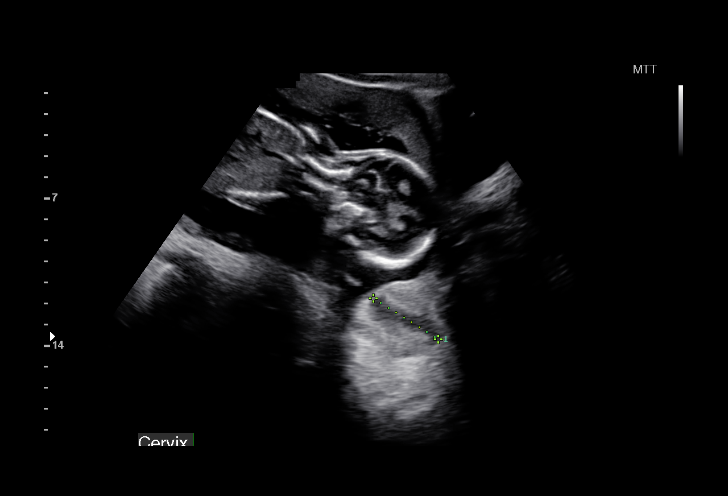
[im 3/35]
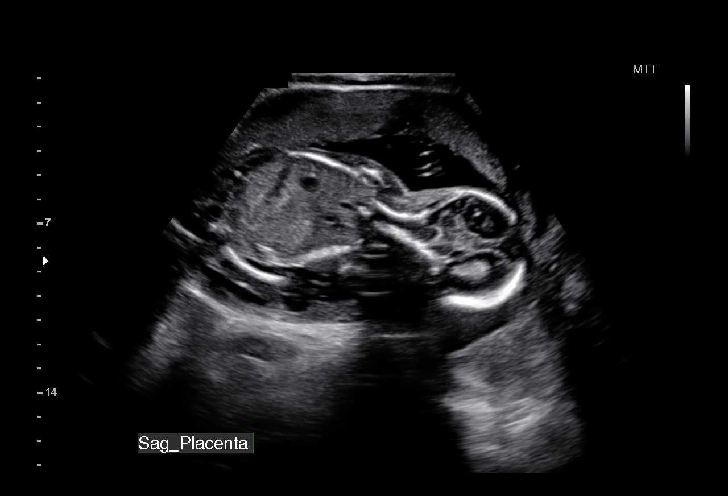
[im 6/35]
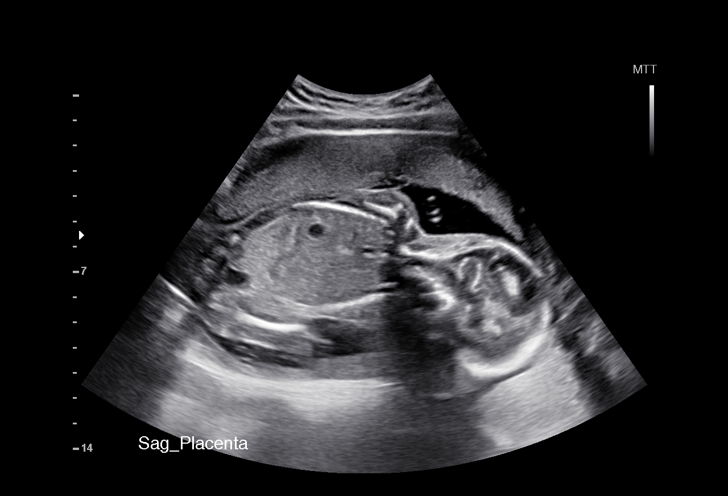
[im 8/35]
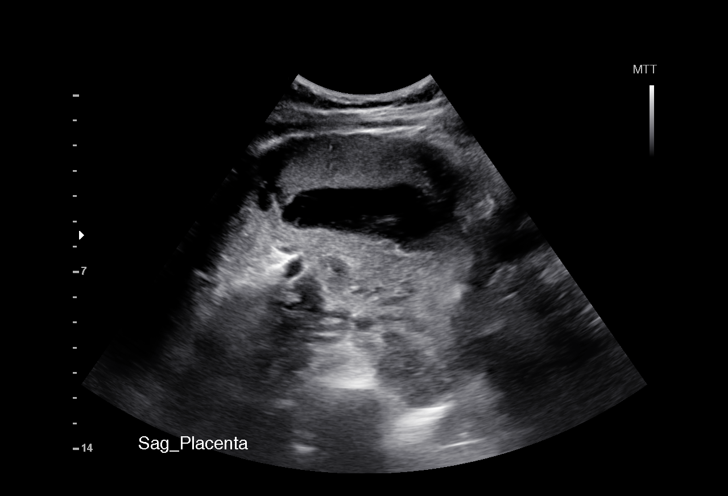
[im 11/35]
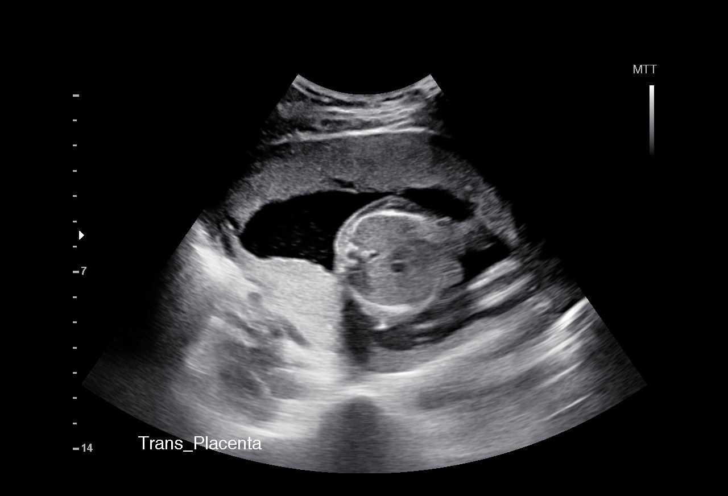
[im 13/35]
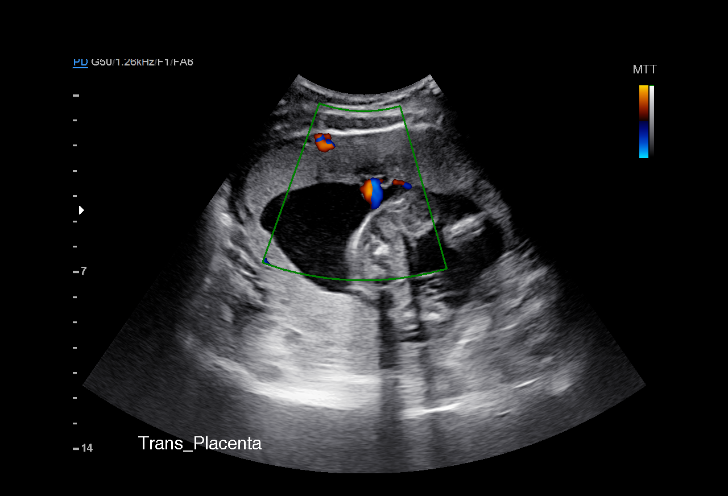
[im 16/35]
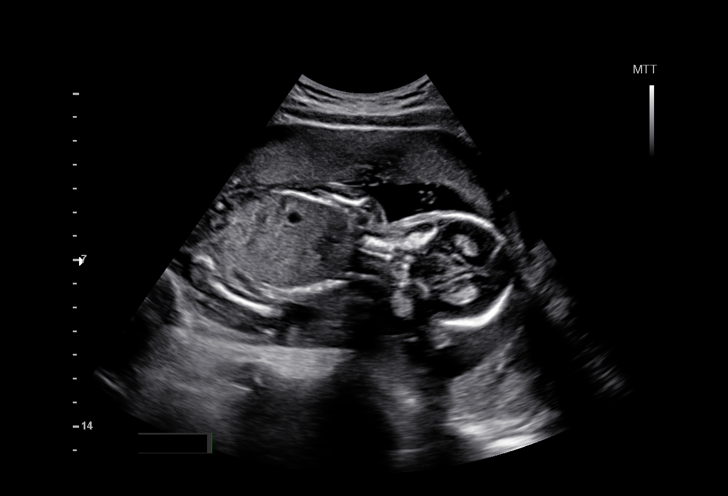
[im 18/35]
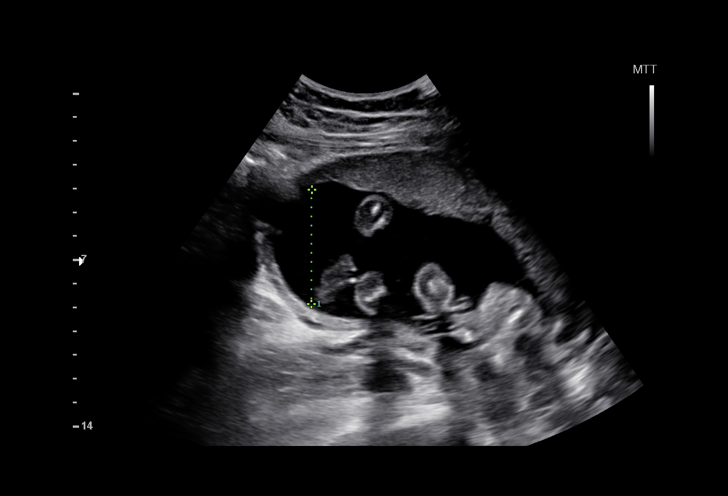
[im 19/35]
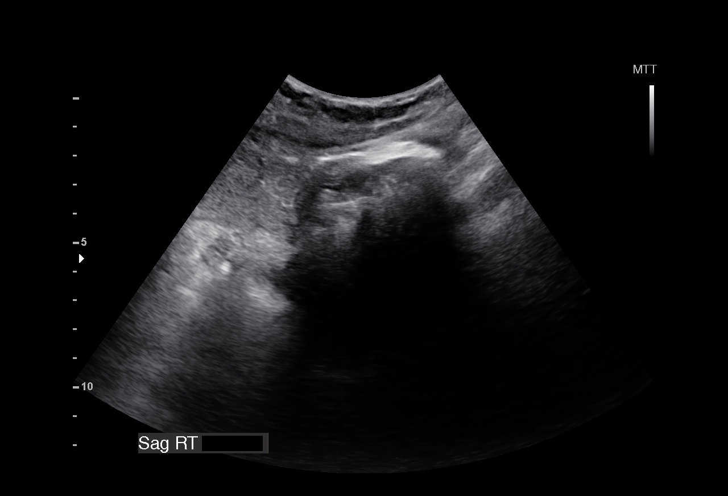
[im 22/35]
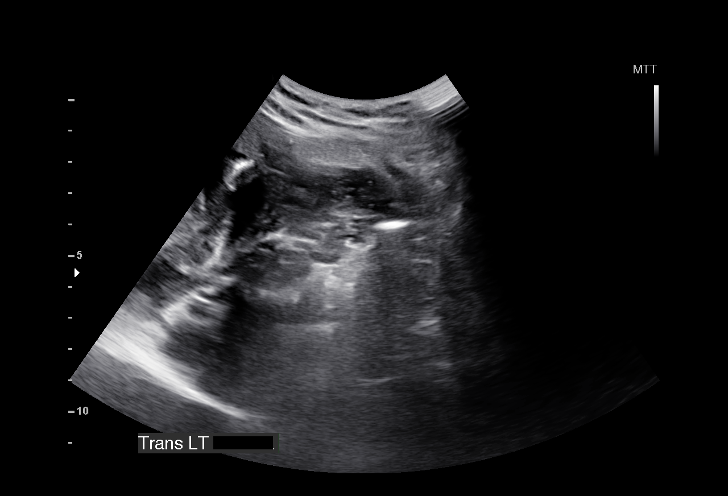
[im 24/35]
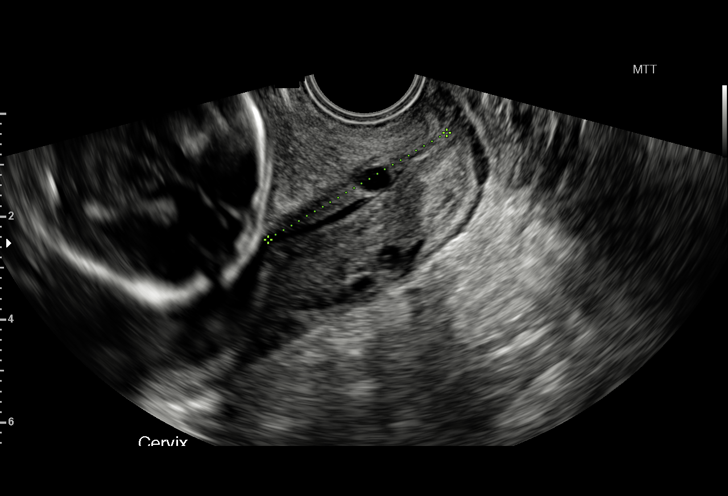
[im 27/35]
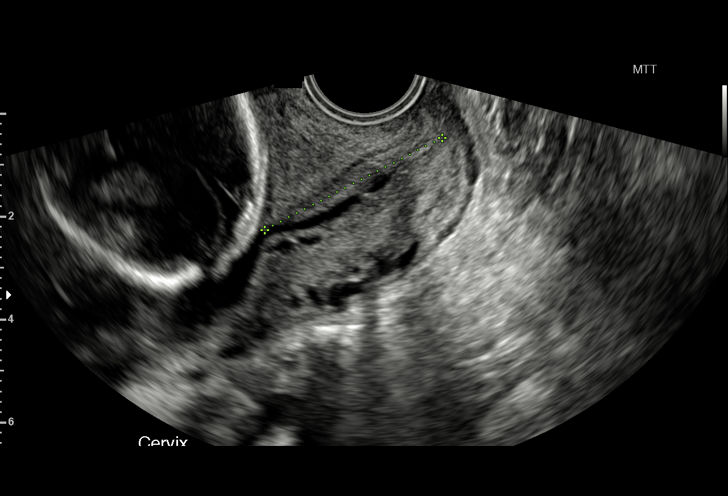
[im 29/35]
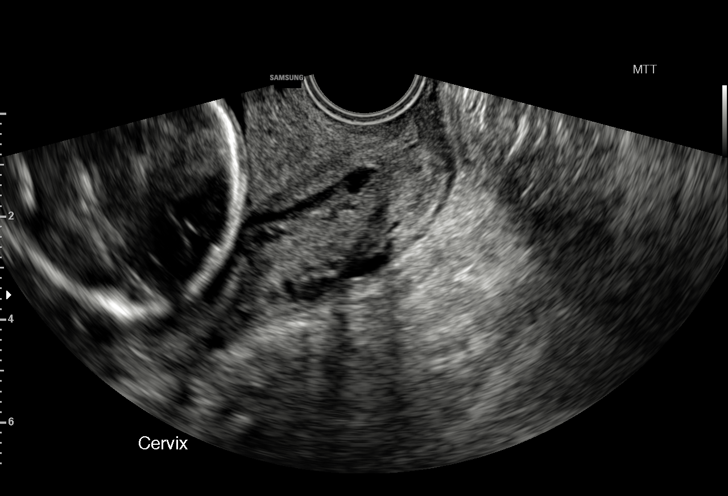
[im 32/35]
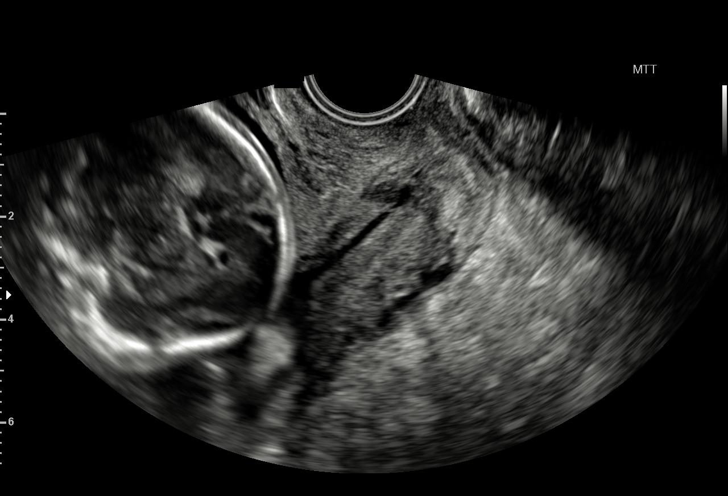
[im 35/35]
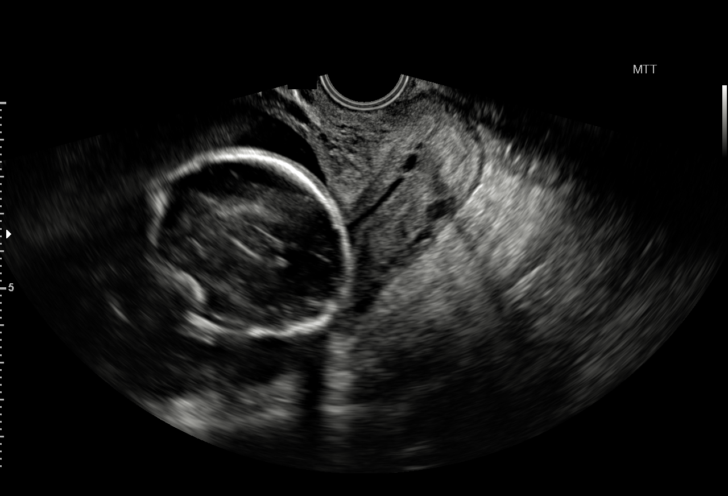

[15 of 28 positions shown; findings below may reference images not displayed]

OBSTETRICS REPORT
(Signed Final 09/01/2015 [DATE])

Service(s) Provided

US MFM OB TRANSVAGINAL                                76817.2
Indications

20 weeks gestation of pregnancy
Preterm contractions
Pelvic Pressure in Pregnancy
Chronic Hepatitis C complicating pregnancy,
antepartum
Drug use complicating pregnancy, second trimester
Tobacco use complicating pregnancy, second
trimester
Fetal Evaluation

Num Of Fetuses:    1
Fetal Heart Rate:  149                          bpm
Cardiac Activity:  Observed
Presentation:      Cephalic
Placenta:          Anterior, above cervical os
P. Cord            Visualized, central
Insertion:

Amniotic Fluid
AFI FV:      Subjectively within normal limits
Larg Pckt:     4.8  cm
Gestational Age

LMP:           18w 6d        Date:  04/21/15                 [name]:   01/26/16
Best:          20w 1d     Det. By:  Previous Ultrasound      [name]:   01/17/16
(08/03/15)
Cervix Uterus Adnexa

Cervical Length:    3.71     cm

Cervix:       Normal appearance by transvaginal scan
Uterus:       No abnormality visualized.
Cul De Sac:   No free fluid seen.
Left Ovary:    No adnexal mass visualized.
Right Ovary:   No adnexal mass visualized.

Adnexa:     No abnormality visualized.
Impression

Singleton intrauterine pregnancy at 20 weeks 1 day gestation
with fetal cardaic activity
Cephalic presentation
Cervical length of 
3.7cm
Anterior placenta without sonographic evidence of previa
Recommendations

Follow-up ultrasounds as clinically indicated.

questions or concerns.

## 2017-10-05 ENCOUNTER — Other Ambulatory Visit: Payer: Self-pay

## 2017-10-05 ENCOUNTER — Encounter (HOSPITAL_COMMUNITY): Payer: Self-pay | Admitting: *Deleted

## 2017-10-05 ENCOUNTER — Emergency Department (HOSPITAL_COMMUNITY)
Admission: EM | Admit: 2017-10-05 | Discharge: 2017-10-05 | Disposition: A | Discharge status: Home / Self Care | Payer: 59 | Attending: Emergency Medicine | Admitting: Emergency Medicine

## 2017-10-05 DIAGNOSIS — Z79899 Other long term (current) drug therapy: Secondary | ICD-10-CM | POA: Diagnosis not present

## 2017-10-05 DIAGNOSIS — Z3A01 Less than 8 weeks gestation of pregnancy: Secondary | ICD-10-CM | POA: Insufficient documentation

## 2017-10-05 DIAGNOSIS — O99331 Smoking (tobacco) complicating pregnancy, first trimester: Secondary | ICD-10-CM | POA: Insufficient documentation

## 2017-10-05 DIAGNOSIS — O2311 Infections of bladder in pregnancy, first trimester: Secondary | ICD-10-CM | POA: Diagnosis not present

## 2017-10-05 DIAGNOSIS — F17210 Nicotine dependence, cigarettes, uncomplicated: Secondary | ICD-10-CM | POA: Diagnosis not present

## 2017-10-05 DIAGNOSIS — N3001 Acute cystitis with hematuria: Secondary | ICD-10-CM

## 2017-10-05 LAB — URINALYSIS, ROUTINE W REFLEX MICROSCOPIC
GLUCOSE, UA: NEGATIVE mg/dL
Ketones, ur: 20 mg/dL — AB
NITRITE: POSITIVE — AB
Protein, ur: 100 mg/dL — AB
SPECIFIC GRAVITY, URINE: 1.027 (ref 1.005–1.030)
pH: 5 (ref 5.0–8.0)

## 2017-10-05 LAB — WET PREP, GENITAL
CLUE CELLS WET PREP: NONE SEEN
Sperm: NONE SEEN
TRICH WET PREP: NONE SEEN
Yeast Wet Prep HPF POC: NONE SEEN

## 2017-10-05 LAB — PREGNANCY, URINE: PREG TEST UR: POSITIVE — AB

## 2017-10-05 MED ORDER — CEPHALEXIN 500 MG PO CAPS: 500.0000 mg | ORAL_CAPSULE | Freq: Four times a day (QID) | ORAL | 0 refills | Status: DC

## 2017-10-05 MED ORDER — DOXYLAMINE-PYRIDOXINE 10-10 MG PO TBEC: 2.0000 | DELAYED_RELEASE_TABLET | Freq: Every day | ORAL | 0 refills | Status: AC

## 2017-10-05 MED ORDER — PRENATAL COMPLETE 14-0.4 MG PO TABS: 1.0000 | ORAL_TABLET | Freq: Every day | ORAL | 0 refills | Status: DC

## 2017-10-05 NOTE — ED Triage Notes (Signed)
Pt reports has missed here period and believes she may be pregnant. Pt reports intermittent llq cramping. Pt has not taken a home pregnancy test.

## 2017-10-05 NOTE — ED Provider Notes (Signed)
[facility] EMERGENCY DEPARTMENT Provider Note   CSN: [id number] Arrival date & time: 10/05/17  1858     History   Chief Complaint Chief Complaint  Patient presents with  . Possible Pregnancy    HPI Laurie [name] is a 21 y.o. female, G2 P2 with C-section x2, youngest child is 9 months, presenting for evaluation of possible pregnancy and vaginal infection.  She reports she is currently 2 weeks late with her period, LMP 08/22/17 but also endorses white watery vaginal discharge which is been present for the past week.  She denies abdominal pain or back pain, no fevers or chills, she has had some intermittent nausea without emesis, denies vaginal bleeding.  She does endorse increased urinary frequency and burning pain with urination in association with suprapubic pressure sensation which radiates to her bilateral lower quadrants. She has found no alleviators.  The history is provided by the patient.    Past Medical History:  Diagnosis Date  . Anxiety   . Asthma    "grew out"  . Chlamydia   . Depression    doing really good  . Gestational diabetes   . Hepatitis C June 2016  . History of heroin abuse   . Infection    UTI    Patient Active Problem List   Diagnosis Date Noted  . Postoperative anemia due to acute blood loss 12/22/2016  . Placental abruption 12/21/2016  . Gestational thrombocytopenia (HCC) 12/20/2016  . GDM (gestational diabetes mellitus) 11/28/2016  . Tobacco abuse 11/25/2016  . Rh negative state in antepartum period 11/25/2016  . Preterm premature rupture of membranes (PPROM) with unknown onset of labor 11/13/2016  . S/P preterm cesarean section for PPROM and abruption 01/12/2016  . Polysubstance abuse (HCC) 08/31/2015    Past Surgical History:  Procedure Laterality Date  . CESAREAN SECTION N/A 01/11/2016   Procedure: CESAREAN SECTION;  Surgeon: Laurie Badharles A Harper, MD;  Location: [facility] ORS;  Service: Obstetrics;  Laterality: N/A;  . CESAREAN SECTION N/A  12/21/2016   Procedure: REPEAT CESAREAN SECTION;  Surgeon: Laurie PhenixJames G Arnold, MD;  [facility];  Service: Obstetrics;  Laterality: N/A;    OB History    Gravida Para Term Preterm AB Living   3 2 1 1 1 1    SAB TAB Ectopic Multiple Live Births   1 0 0 0 1       Home Medications    Prior to Admission medications   Medication Sig Start Date End Date Taking? Authorizing Provider  cephALEXin (KEFLEX) 500 MG capsule Take 1 capsule (500 mg total) by mouth 4 (four) times daily. 10/05/17   Laurie [name], Laurie Hunton, PA-C  Doxylamine-Pyridoxine (DICLEGIS) 10-10 MG TBEC Take 2 tablets by mouth at bedtime. 10/05/17 11/04/17  Laurie [name], Laurie Ronning, PA-C  ibuprofen (ADVIL,MOTRIN) 600 MG tablet Take 1 tablet (600 mg total) by mouth every 6 (six) hours. 12/24/16   Laurie [name], Laurie [name], CNM  oxyCODONE-acetaminophen (PERCOCET/ROXICET) 5-325 MG tablet Take 1 tablet by mouth every 4 (four) hours as needed (pain scale > 7). 12/24/16   Laurie [name], Laurie [name], CNM  Prenatal Vit-Fe Fumarate-FA (PRENATAL COMPLETE) 14-0.4 MG TABS Take 1 tablet by mouth daily. 10/05/17   Laurie [name], Laurie Eriksson, PA-C    Family History Family History  Problem Relation Age of Onset  . Diabetes Mother   . Pulmonary embolism Mother   . Stroke Mother   . Cancer Paternal Grandmother        liver  . Epilepsy Brother     Social History  Social History   Tobacco Use  . Smoking status: Current Some Day Smoker    Packs/day: 1.00    Types: Cigarettes  . Smokeless tobacco: Never Used  Substance Use Topics  . Alcohol use: No    Comment: Occas use.; none since mid Sept  . Drug use: Yes    Types: Marijuana, Heroin     Allergies   Penicillins   Review of Systems Review of Systems  Constitutional: Negative for fever.  HENT: Negative for congestion and sore throat.   Eyes: Negative.   Respiratory: Negative for chest tightness and shortness of breath.   Cardiovascular: Negative for chest pain.  Gastrointestinal: Positive for nausea. Negative for abdominal  pain and vomiting.  Genitourinary: Positive for frequency, pelvic pain and vaginal discharge. Negative for flank pain.  Musculoskeletal: Negative for arthralgias, joint swelling and neck pain.  Skin: Negative.  Negative for rash and wound.  Neurological: Negative for dizziness, weakness, light-headedness, numbness and headaches.  Psychiatric/Behavioral: Negative.      Physical Exam Updated Vital Signs BP 117/70 (BP Location: Right Arm)   Pulse 84   Temp 98 F (36.7 C) (Oral)   Resp 17   Ht 5\' 3"  (1.6 m)   Wt 59.1 kg (130 lb 6 oz)   LMP 08/22/2017   SpO2 100%   BMI 23.09 kg/m   Physical Exam  Constitutional: She appears well-developed and well-nourished.  HENT:  Head: Normocephalic and atraumatic.  Eyes: Conjunctivae are normal.  Neck: Normal range of motion.  Cardiovascular: Normal rate, regular rhythm, normal heart sounds and intact distal pulses.  Pulmonary/Chest: Effort normal and breath sounds normal. She has no wheezes.  Abdominal: Soft. Bowel sounds are normal. There is no tenderness.  Genitourinary: Uterus is tender. Cervix exhibits no motion tenderness and no discharge. Right adnexum displays tenderness. Right adnexum displays no mass and no fullness. Left adnexum displays tenderness. Left adnexum displays no mass and no fullness. No tenderness or bleeding in the vagina. No vaginal discharge found.  Musculoskeletal: Normal range of motion.  Neurological: She is alert.  Skin: Skin is warm and dry.  Psychiatric: She has a normal mood and affect.  Nursing note and vitals reviewed.    ED Treatments / Results  Labs (all labs ordered are listed, but only abnormal results are displayed) Labs Reviewed  WET PREP, GENITAL - Abnormal; Notable for the following components:      Result Value   WBC, Wet Prep HPF POC MODERATE (*)    All other components within normal limits  URINALYSIS, ROUTINE W REFLEX MICROSCOPIC - Abnormal; Notable for the following components:   Color,  Urine AMBER (*)    APPearance CLOUDY (*)    Hgb urine dipstick SMALL (*)    Bilirubin Urine SMALL (*)    Ketones, ur 20 (*)    Protein, ur 100 (*)    Nitrite POSITIVE (*)    Leukocytes, UA MODERATE (*)    Bacteria, UA MANY (*)    Squamous Epithelial / LPF 6-30 (*)    Non Squamous Epithelial 0-5 (*)    All other components within normal limits  PREGNANCY, URINE - Abnormal; Notable for the following components:   Preg Test, Ur POSITIVE (*)    All other components within normal limits  URINE CULTURE  GC/CHLAMYDIA PROBE AMP ([place]) NOT AT [facility]    EKG  EKG Interpretation None       Radiology No results found.  Procedures Procedures (including critical care time)  Medications  Ordered in ED Medications - No data to display   Initial Impression / Assessment and Plan / ED Course  I have reviewed the triage vital signs and the nursing notes.  Pertinent labs & imaging results that were available during my care of the patient were reviewed by me and considered in my medical decision making (see chart for details).     Patient with early pregnancy, UTI based on urinalysis and symptoms.  She was placed on Keflex, prenatal vitamin also prescribed diplegia's per patient request that she has had some nausea.  [facility].  Urine culture was sent.  GC and chlamydia cultures currently pending.  Final Clinical Impressions(s) / ED Diagnoses   Final diagnoses:  Less than [redacted] weeks gestation of pregnancy  Acute cystitis with hematuria    ED Discharge Orders        Ordered    Doxylamine-Pyridoxine (DICLEGIS) 10-10 MG TBEC  Daily at bedtime     10/05/17 2130    Prenatal Vit-Fe Fumarate-FA (PRENATAL COMPLETE) 14-0.4 MG TABS  Daily     10/05/17 2130    cephALEXin (KEFLEX) 500 MG capsule  4 times daily     10/05/17 2130       Laurie Amordol, Coila Wardell, Cordelia Poche-C 10/05/17 2138    Doug SouJacubowitz, Sam, MD 10/06/17 [phone]

## 2017-10-07 LAB — GC/CHLAMYDIA PROBE AMP (~~LOC~~) NOT AT ARMC
CHLAMYDIA, DNA PROBE: NEGATIVE
Neisseria Gonorrhea: NEGATIVE

## 2017-10-08 LAB — URINE CULTURE
Culture: >=100000 — AB
Special Requests: NORMAL

## 2017-10-09 ENCOUNTER — Telehealth: Payer: Self-pay | Admitting: *Deleted

## 2017-10-09 NOTE — Telephone Encounter (Signed)
Post ED Visit - Positive Culture Follow-up  Culture report reviewed by antimicrobial stewardship pharmacist:  [x]  Enzo BiNathan Batchelder, Pharm.D. []  Celedonio MiyamotoJeremy Frens, Pharm.D., BCPS AQ-ID []  Garvin FilaMike Maccia, Pharm.D., BCPS []  Georgina PillionElizabeth Martin, Pharm.D., BCPS []  [place] Neck PlazaMinh Pham, [address] Rainbow BoulevardPharm.D., BCPS, AAHIVP []  Estella HuskMichelle Turner, Pharm.D., BCPS, AAHIVP []  Lysle Pearlachel Rumbarger, PharmD, BCPS []  Casilda Carlsaylor Stone, PharmD, BCPS []  Pollyann SamplesAndy Johnston, PharmD, BCPS  Positive urine culture Treated with Cephalexin, organism sensitive to the same and no further patient follow-up is required at this time.  Virl AxeRobertson, Burton Gahan Talley 10/09/2017, 10:00 AM

## 2017-10-10 ENCOUNTER — Emergency Department (HOSPITAL_COMMUNITY): Payer: 59

## 2017-10-10 ENCOUNTER — Encounter (HOSPITAL_COMMUNITY): Payer: Self-pay | Admitting: Emergency Medicine

## 2017-10-10 ENCOUNTER — Emergency Department (HOSPITAL_COMMUNITY)
Admission: EM | Admit: 2017-10-10 | Discharge: 2017-10-10 | Disposition: A | Discharge status: Home / Self Care | Payer: 59 | Attending: Emergency Medicine | Admitting: Emergency Medicine

## 2017-10-10 DIAGNOSIS — O200 Threatened abortion: Secondary | ICD-10-CM

## 2017-10-10 DIAGNOSIS — O9989 Other specified diseases and conditions complicating pregnancy, childbirth and the puerperium: Secondary | ICD-10-CM | POA: Diagnosis present

## 2017-10-10 DIAGNOSIS — O2331 Infections of other parts of urinary tract in pregnancy, first trimester: Secondary | ICD-10-CM | POA: Insufficient documentation

## 2017-10-10 DIAGNOSIS — N3000 Acute cystitis without hematuria: Secondary | ICD-10-CM

## 2017-10-10 DIAGNOSIS — F17210 Nicotine dependence, cigarettes, uncomplicated: Secondary | ICD-10-CM | POA: Insufficient documentation

## 2017-10-10 DIAGNOSIS — R1084 Generalized abdominal pain: Secondary | ICD-10-CM | POA: Diagnosis not present

## 2017-10-10 DIAGNOSIS — O99331 Smoking (tobacco) complicating pregnancy, first trimester: Secondary | ICD-10-CM | POA: Diagnosis not present

## 2017-10-10 DIAGNOSIS — Z3A01 Less than 8 weeks gestation of pregnancy: Secondary | ICD-10-CM | POA: Insufficient documentation

## 2017-10-10 LAB — BASIC METABOLIC PANEL
Anion gap: 11 (ref 5–15)
BUN: 10 mg/dL (ref 6–20)
CO2: 21 mmol/L — ABNORMAL LOW (ref 22–32)
Calcium: 9.1 mg/dL (ref 8.9–10.3)
Chloride: 105 mmol/L (ref 101–111)
Creatinine, Ser: 0.77 mg/dL (ref 0.44–1.00)
GFR calc Af Amer: >60 mL/min (ref >60)
Glucose, Bld: 94 mg/dL (ref 65–99)
POTASSIUM: 3.1 mmol/L — AB (ref 3.5–5.1)
SODIUM: 137 mmol/L (ref 135–145)

## 2017-10-10 LAB — CBC WITH DIFFERENTIAL/PLATELET
BASOS ABS: 0.1 10*3/uL (ref 0.0–0.1)
Basophils Relative: 1 %
EOS ABS: 0.1 10*3/uL (ref 0.0–0.7)
EOS PCT: 1 %
HCT: 39.5 % (ref 36.0–46.0)
Hemoglobin: 13.2 g/dL (ref 12.0–15.0)
Lymphocytes Relative: 45 %
Lymphs Abs: 3.9 10*3/uL (ref 0.7–4.0)
MCH: 28.9 pg (ref 26.0–34.0)
MCHC: 33.4 g/dL (ref 30.0–36.0)
MCV: 86.6 fL (ref 78.0–100.0)
Monocytes Absolute: 0.6 10*3/uL (ref 0.1–1.0)
Monocytes Relative: 7 %
Neutro Abs: 3.9 10*3/uL (ref 1.7–7.7)
Neutrophils Relative %: 46 %
PLATELETS: 196 10*3/uL (ref 150–400)
RBC: 4.56 MIL/uL (ref 3.87–5.11)
RDW: 13.1 % (ref 11.5–15.5)
WBC: 8.6 10*3/uL (ref 4.0–10.5)

## 2017-10-10 LAB — URINALYSIS, ROUTINE W REFLEX MICROSCOPIC
Bilirubin Urine: NEGATIVE
GLUCOSE, UA: NEGATIVE mg/dL
Hgb urine dipstick: NEGATIVE
KETONES UR: NEGATIVE mg/dL
Nitrite: NEGATIVE
PH: 6 (ref 5.0–8.0)
Protein, ur: NEGATIVE mg/dL
SPECIFIC GRAVITY, URINE: 1.01 (ref 1.005–1.030)

## 2017-10-10 LAB — WET PREP, GENITAL
Sperm: NONE SEEN
TRICH WET PREP: NONE SEEN
Yeast Wet Prep HPF POC: NONE SEEN

## 2017-10-10 LAB — HCG, QUANTITATIVE, PREGNANCY: hCG, Beta Chain, Quant, S: 10913 m[IU]/mL — ABNORMAL HIGH (ref <5)

## 2017-10-10 MED ORDER — DIPHENHYDRAMINE HCL 50 MG/ML IJ SOLN
25.0000 mg | Freq: Once | INTRAMUSCULAR | Status: AC
  Administered 2017-10-10: 25 mg via INTRAVENOUS
  Filled 2017-10-10: qty 1

## 2017-10-10 MED ORDER — METOCLOPRAMIDE HCL 5 MG/ML IJ SOLN
10.0000 mg | Freq: Once | INTRAMUSCULAR | Status: AC
  Administered 2017-10-10: 10 mg via INTRAVENOUS
  Filled 2017-10-10: qty 2

## 2017-10-10 MED ORDER — CEPHALEXIN 500 MG PO CAPS: 500.0000 mg | ORAL_CAPSULE | Freq: Two times a day (BID) | ORAL | 0 refills | Status: DC

## 2017-10-10 MED ORDER — SODIUM CHLORIDE 0.9 % IV BOLUS (SEPSIS)
1000.0000 mL | Freq: Once | INTRAVENOUS | Status: AC
  Administered 2017-10-10: 1000 mL via INTRAVENOUS

## 2017-10-10 MED ORDER — DEXTROSE 5 % IV SOLN
1.0000 g | Freq: Once | INTRAVENOUS | Status: AC
  Administered 2017-10-10: 1 g via INTRAVENOUS
  Filled 2017-10-10: qty 10

## 2017-10-10 MED ORDER — HYDROCODONE-ACETAMINOPHEN 5-325 MG PO TABS: 1.0000 | ORAL_TABLET | ORAL | 0 refills | Status: DC | PRN

## 2017-10-10 NOTE — ED Triage Notes (Signed)
Pt states she has had severe lower back and upper abdominal x 3 hours with vaginal bleeding that saturated 2 panty liners around 2000 10/09/17, pt reports n/v/d since 0730 10/10/17-pt reports 20+ episodes of vomiting and 9 episodes of diarrhea, denies fever

## 2017-10-10 NOTE — ED Provider Notes (Addendum)
[facility] EMERGENCY DEPARTMENT Provider Note   CSN: [id number] Arrival date & time: 10/10/17  0057     History   Chief Complaint Chief Complaint  Patient presents with  . Back Pain  . Abdominal Pain    HPI Laurie [name] is a 21 y.o. female.  Patient presents to the emergency department for evaluation of bilateral flank pain, abdominal pain, nausea and vomiting.  Patient reports that symptoms have been present for 1 day.  She was seen in this emergency department 1 week ago and had a positive pregnancy test.  She was also diagnosed with urinary tract infection at that visit, did not purchase her antibiotic, has not taken any antibiotics for the infection.  She reports that her urinary frequency and dysuria has progressively worsened.      Past Medical History:  Diagnosis Date  . Anxiety   . Asthma    "grew out"  . Chlamydia   . Depression    doing really good  . Gestational diabetes   . Hepatitis C June 2016  . History of heroin abuse   . Infection    UTI    Patient Active Problem List   Diagnosis Date Noted  . Postoperative anemia due to acute blood loss 12/22/2016  . Placental abruption 12/21/2016  . Gestational thrombocytopenia (HCC) 12/20/2016  . GDM (gestational diabetes mellitus) 11/28/2016  . Tobacco abuse 11/25/2016  . Rh negative state in antepartum period 11/25/2016  . Preterm premature rupture of membranes (PPROM) with unknown onset of labor 11/13/2016  . S/P preterm cesarean section for PPROM and abruption 01/12/2016  . Polysubstance abuse (HCC) 08/31/2015    Past Surgical History:  Procedure Laterality Date  . CESAREAN SECTION N/A 01/11/2016   Procedure: CESAREAN SECTION;  Surgeon: Brock Bad, MD;  Location: [facility];  Service: Obstetrics;  Laterality: N/A;  . CESAREAN SECTION N/A 12/21/2016   Procedure: REPEAT CESAREAN SECTION;  Surgeon: Adam Phenix, MD;  Location: [facility] BIRTHING SUITES;  Service: Obstetrics;  Laterality: N/A;    OB  History    Gravida Para Term Preterm AB Living   3 2 1 1 1 1    SAB TAB Ectopic Multiple Live Births   1 0 0 0 1       Home Medications    Prior to Admission medications   Medication Sig Start Date End Date Taking? Authorizing Provider  cephALEXin (KEFLEX) 500 MG capsule Take 1 capsule (500 mg total) by mouth 2 (two) times daily. 10/10/17   Gilda Crease, MD  Doxylamine-Pyridoxine (DICLEGIS) 10-10 MG TBEC Take 2 tablets by mouth at bedtime. 10/05/17 11/04/17  Burgess Amor, PA-C  HYDROcodone-acetaminophen (NORCO/VICODIN) 5-325 MG tablet Take 1 tablet by mouth every 4 (four) hours as needed for moderate pain or severe pain. 10/10/17   Gilda Crease, MD  ibuprofen (ADVIL,MOTRIN) 600 MG tablet Take 1 tablet (600 mg total) by mouth every 6 (six) hours. 12/24/16   Aviva Signs, CNM  oxyCODONE-acetaminophen (PERCOCET/ROXICET) 5-325 MG tablet Take 1 tablet by mouth every 4 (four) hours as needed (pain scale > 7). 12/24/16   Aviva Signs, CNM  Prenatal Vit-Fe Fumarate-FA (PRENATAL COMPLETE) 14-0.4 MG TABS Take 1 tablet by mouth daily. 10/05/17   Burgess Amor, PA-C    Family History Family History  Problem Relation Age of Onset  . Diabetes Mother   . Pulmonary embolism Mother   . Stroke Mother   . Cancer Paternal Grandmother  liver  . Epilepsy Brother     Social History Social History   Tobacco Use  . Smoking status: Current Some Day Smoker    Packs/day: 1.00    Types: Cigarettes  . Smokeless tobacco: Never Used  Substance Use Topics  . Alcohol use: No    Comment: Occas use.; none since mid Sept  . Drug use: Yes    Types: Marijuana, Heroin     Allergies   Penicillins   Review of Systems Review of Systems  Genitourinary: Positive for dysuria, flank pain and frequency.  Musculoskeletal: Positive for back pain.  All other systems reviewed and are negative.    Physical Exam Updated Vital Signs BP 114/69 (BP Location: Left Arm)   Pulse 88    Temp 98.1 F (36.7 C) (Oral)   Resp 18   Ht 5\' 3"  (1.6 m)   Wt 59 kg (130 lb)   SpO2 98%   BMI 23.03 kg/m   Physical Exam  Constitutional: She is oriented to person, place, and time. She appears well-developed and well-nourished. No distress.  HENT:  Head: Normocephalic and atraumatic.  Right Ear: Hearing normal.  Left Ear: Hearing normal.  Nose: Nose normal.  Mouth/Throat: Oropharynx is clear and moist and mucous membranes are normal.  Eyes: Conjunctivae and EOM are normal. Pupils are equal, round, and reactive to light.  Neck: Normal range of motion. Neck supple.  Cardiovascular: Regular rhythm, S1 normal and S2 normal. Exam reveals no gallop and no friction rub.  No murmur heard. Pulmonary/Chest: Effort normal and breath sounds normal. No respiratory distress. She exhibits no tenderness.  Abdominal: Soft. Normal appearance and bowel sounds are normal. There is no hepatosplenomegaly. There is no tenderness. There is no rebound, no guarding, no tenderness at McBurney's point and negative Murphy's sign. No hernia.  Musculoskeletal: Normal range of motion.  Neurological: She is alert and oriented to person, place, and time. She has normal strength. No cranial nerve deficit or sensory deficit. Coordination normal. GCS eye subscore is 4. GCS verbal subscore is 5. GCS motor subscore is 6.  Skin: Skin is warm, dry and intact. No rash noted. No cyanosis.  Psychiatric: She has a normal mood and affect. Her speech is normal and behavior is normal. Thought content normal.  Nursing note and vitals reviewed.    ED Treatments / Results  Labs (all labs ordered are listed, but only abnormal results are displayed) Labs Reviewed  WET PREP, GENITAL - Abnormal; Notable for the following components:      Result Value   Clue Cells Wet Prep HPF POC PRESENT (*)    WBC, Wet Prep HPF POC MANY (*)    All other components within normal limits  BASIC METABOLIC PANEL - Abnormal; Notable for the following  components:   Potassium 3.1 (*)    CO2 21 (*)    All other components within normal limits  URINALYSIS, ROUTINE W REFLEX MICROSCOPIC - Abnormal; Notable for the following components:   APPearance CLOUDY (*)    Leukocytes, UA SMALL (*)    Bacteria, UA FEW (*)    Squamous Epithelial / LPF 6-30 (*)    All other components within normal limits  HCG, QUANTITATIVE, PREGNANCY - Abnormal; Notable for the following components:   hCG, Beta Chain, Quant, S 10,913 (*)    All other components within normal limits  CBC WITH DIFFERENTIAL/PLATELET    EKG  EKG Interpretation None       Radiology US Ob Comp Less 14  Wks  Result Date: 10/10/2017 CLINICAL DATA:  Pregnant patient in first-trimester pregnancy with pelvic pain. EXAM: OBSTETRIC <14 WK US AND TRANSVAGINAL OB US TECHNIQUE: Both transabdominal and transvaginal ultrasound examinations were performed for complete evaluation of the gestation as well as the maternal uterus, adnexal regions, and pelvic cul-de-sac. Transvaginal technique was performed to assess early pregnancy. COMPARISON:  None this pregnancy. FINDINGS: Intrauterine gestational sac: Single Yolk sac:  Visualized. Embryo:  Visualized. Cardiac Activity: Not Visualized. CRL:  2  mm   5 w   5 d                  US EDC: 06/07/2017 Subchorionic hemorrhage:  None visualized. Maternal uterus/adnexae: Shadowing 7 mm calcification in the myometrium. Both ovaries are well-visualized and are normal. Probable corpus luteal cyst in the right ovary. No adnexal mass or pelvic free fluid. IMPRESSION: Single intrauterine gestation, fetal pole measuring 2 mm without fetal heart tones. Findings are suspicious but not yet definitive for failed pregnancy. Recommend follow-up US in 10-14 days for definitive diagnosis. This recommendation follows SRU consensus guidelines: Diagnostic Criteria for Nonviable Pregnancy Early in the First Trimester. Malva Limes Engl J Med 2013; 161:0960-45; 369:1443-51. Electronically Signed   By: Rubye OaksMelanie   Ehinger M.D.   On: 10/10/2017 05:12   [place] Ob Transvaginal  Result Date: 10/10/2017 CLINICAL DATA:  Pregnant patient in first-trimester pregnancy with pelvic pain. EXAM: OBSTETRIC <14 WK US AND TRANSVAGINAL OB US TECHNIQUE: Both transabdominal and transvaginal ultrasound examinations were performed for complete evaluation of the gestation as well as the maternal uterus, adnexal regions, and pelvic cul-de-sac. Transvaginal technique was performed to assess early pregnancy. COMPARISON:  None this pregnancy. FINDINGS: Intrauterine gestational sac: Single Yolk sac:  Visualized. Embryo:  Visualized. Cardiac Activity: Not Visualized. CRL:  2  mm   5 w   5 d                  US EDC: 06/07/2017 Subchorionic hemorrhage:  None visualized. Maternal uterus/adnexae: Shadowing 7 mm calcification in the myometrium. Both ovaries are well-visualized and are normal. Probable corpus luteal cyst in the right ovary. No adnexal mass or pelvic free fluid. IMPRESSION: Single intrauterine gestation, fetal pole measuring 2 mm without fetal heart tones. Findings are suspicious but not yet definitive for failed pregnancy. Recommend follow-up US in 10-14 days for definitive diagnosis. This recommendation follows SRU consensus guidelines: Diagnostic Criteria for Nonviable Pregnancy Early in the First Trimester. Malva Limes Engl J Med 2013; 409:8119-14; 369:1443-51. Electronically Signed   By: Rubye OaksMelanie  Ehinger M.D.   On: 10/10/2017 05:12    Procedures Procedures (including critical care time)  Medications Ordered in ED Medications  metoCLOPramide (REGLAN) injection 10 mg (10 mg Intravenous Given 10/10/17 0137)  diphenhydrAMINE (BENADRYL) injection 25 mg (25 mg Intravenous Given 10/10/17 0137)  sodium chloride 0.9 % bolus 1,000 mL (0 mLs Intravenous Stopped 10/10/17 0326)  cefTRIAXone (ROCEPHIN) 1 g in dextrose 5 % 50 mL IVPB (0 g Intravenous Stopped 10/10/17 0326)     Initial Impression / Assessment and Plan / ED Course  I have reviewed the triage  vital signs and the nursing notes.  Pertinent labs & imaging results that were available during my care of the patient were reviewed by me and considered in my medical decision making (see chart for details).     She presents to the emergency department for evaluation of low back pain and abdominal pain.  Patient reports that she has been having nausea, vomiting and diarrhea associated with the  symptoms.  She was seen in the ER 1 week ago and had severe urinary tract infection.  Culture ultimately grew E. coli that was pansensitive.  Patient has not taken any antibiotics for this.  Her urinalysis actually, somehow, looks improved compared to previous but she is still symptomatic.  She will need further treatment.  Patient was given Reglan and IV hydration and had improvement of her nausea and vomiting, but still had pelvic pain.  Pelvic exam did not reveal any cervical motion tenderness or discharge.  She has not having any vaginal bleeding.  She told the nurse that she had soaked through several pads earlier, but she tells me that this is not actually the case.  She did have some slight spotting earlier.  Patient was sent for ultrasound to rule out ectopic pregnancy.  No evidence of ectopic is seen, but there is concern for failure of progression of pregnancy.  These findings were discussed with the patient.  She understands that she may be miscarrying.  She has a follow-up appointment with Dr. Emelda FearFerguson on January 2 which would be appropriate timing for repeat ultrasound.  Discussed this with Dr. Emelda FearFerguson.  He does not recommend RhoGam at this early date.  Patient was given return precautions, will treat urinary tract infection with Keflex.  She was given a prescription for 10 Vicodin.  She was told to only take this if she started having severe cramping and vaginal bleeding that would confirm miscarriage.  She understands his instructions.  Final Clinical Impressions(s) / ED Diagnoses   Final  diagnoses:  Threatened miscarriage  Acute cystitis without hematuria    ED Discharge Orders        Ordered    cephALEXin (KEFLEX) 500 MG capsule  2 times daily     10/10/17 0648    HYDROcodone-acetaminophen (NORCO/VICODIN) 5-325 MG tablet  Every 4 hours PRN     10/10/17 0648       Gilda CreasePollina, Aisia Correira J, MD 10/10/17 [phone]    Gilda CreasePollina, Stacy Deshler J, MD 10/10/17 [phone]

## 2017-10-10 NOTE — ED Notes (Signed)
Wet prep setup at bedside, gown provided to pt

## 2017-10-20 ENCOUNTER — Other Ambulatory Visit: Payer: Self-pay | Admitting: Obstetrics & Gynecology

## 2017-10-20 DIAGNOSIS — O3680X0 Pregnancy with inconclusive fetal viability, not applicable or unspecified: Secondary | ICD-10-CM

## 2017-10-22 ENCOUNTER — Ambulatory Visit (INDEPENDENT_AMBULATORY_CARE_PROVIDER_SITE_OTHER): Payer: 59

## 2017-10-22 DIAGNOSIS — O3680X0 Pregnancy with inconclusive fetal viability, not applicable or unspecified: Secondary | ICD-10-CM | POA: Diagnosis not present

## 2017-10-22 DIAGNOSIS — Z3A08 8 weeks gestation of pregnancy: Secondary | ICD-10-CM

## 2017-10-22 NOTE — Progress Notes (Signed)
US 6+5 wks,single IUP w/ys,positive fht 118 bpm,normal ovaries bilat,subchorionic hemorrhage 1.8 x .7 x 3.1 cm,crl 8.81 mm,[name] 06/12/2018 BY [facility]

## 2017-11-04 ENCOUNTER — Ambulatory Visit: Payer: 59 | Admitting: *Deleted

## 2017-11-04 ENCOUNTER — Encounter: Payer: 59 | Admitting: Advanced Practice Midwife

## 2017-11-09 ENCOUNTER — Inpatient Hospital Stay (HOSPITAL_COMMUNITY)
Admission: AD | Admit: 2017-11-09 | Discharge: 2017-11-09 | Disposition: A | Discharge status: Home / Self Care | Payer: 59 | Source: Ambulatory Visit | Attending: Obstetrics and Gynecology | Admitting: Obstetrics and Gynecology

## 2017-11-09 ENCOUNTER — Encounter (HOSPITAL_COMMUNITY): Payer: Self-pay | Admitting: *Deleted

## 2017-11-09 DIAGNOSIS — F17210 Nicotine dependence, cigarettes, uncomplicated: Secondary | ICD-10-CM | POA: Insufficient documentation

## 2017-11-09 DIAGNOSIS — B9689 Other specified bacterial agents as the cause of diseases classified elsewhere: Secondary | ICD-10-CM

## 2017-11-09 DIAGNOSIS — Z880 Allergy status to penicillin: Secondary | ICD-10-CM | POA: Insufficient documentation

## 2017-11-09 DIAGNOSIS — O26891 Other specified pregnancy related conditions, first trimester: Secondary | ICD-10-CM | POA: Diagnosis not present

## 2017-11-09 DIAGNOSIS — K5901 Slow transit constipation: Secondary | ICD-10-CM | POA: Insufficient documentation

## 2017-11-09 DIAGNOSIS — N760 Acute vaginitis: Secondary | ICD-10-CM | POA: Diagnosis not present

## 2017-11-09 DIAGNOSIS — O99331 Smoking (tobacco) complicating pregnancy, first trimester: Secondary | ICD-10-CM | POA: Diagnosis not present

## 2017-11-09 DIAGNOSIS — Z3A09 9 weeks gestation of pregnancy: Secondary | ICD-10-CM | POA: Diagnosis not present

## 2017-11-09 DIAGNOSIS — R109 Unspecified abdominal pain: Secondary | ICD-10-CM

## 2017-11-09 DIAGNOSIS — N898 Other specified noninflammatory disorders of vagina: Secondary | ICD-10-CM | POA: Diagnosis present

## 2017-11-09 DIAGNOSIS — O23591 Infection of other part of genital tract in pregnancy, first trimester: Secondary | ICD-10-CM | POA: Insufficient documentation

## 2017-11-09 LAB — WET PREP, GENITAL
Sperm: NONE SEEN
Trich, Wet Prep: NONE SEEN
YEAST WET PREP: NONE SEEN

## 2017-11-09 LAB — URINALYSIS, ROUTINE W REFLEX MICROSCOPIC
BILIRUBIN URINE: NEGATIVE
GLUCOSE, UA: NEGATIVE mg/dL
HGB URINE DIPSTICK: NEGATIVE
KETONES UR: 5 mg/dL — AB
Leukocytes, UA: NEGATIVE
Nitrite: NEGATIVE
Protein, ur: NEGATIVE mg/dL
Specific Gravity, Urine: 1.009 (ref 1.005–1.030)
pH: 6 (ref 5.0–8.0)

## 2017-11-09 MED ORDER — POLYETHYLENE GLYCOL 3350 17 G PO PACK: 17.0000 g | PACK | Freq: Every day | ORAL | 0 refills | Status: DC

## 2017-11-09 MED ORDER — METRONIDAZOLE 500 MG PO TABS: 500.0000 mg | ORAL_TABLET | Freq: Two times a day (BID) | ORAL | 0 refills | Status: DC

## 2017-11-09 NOTE — MAU Note (Signed)
Onset of cramping since this morning that comes and goes, had small amount of bleeding this morning, has stopped.  Vaginal discharge milky white with an odor.

## 2017-11-09 NOTE — Discharge Instructions (Signed)
Bacterial Vaginosis Bacterial vaginosis is an infection of the vagina. It happens when too many germs (bacteria) grow in the vagina. This infection puts you at risk for infections from sex (STIs). Treating this infection can lower your risk for some STIs. You should also treat this if you are pregnant. It can cause your baby to be born early. Follow these instructions at home: Medicines  Take over-the-counter and prescription medicines only as told by your doctor.  Take or use your antibiotic medicine as told by your doctor. Do not stop taking or using it even if you start to feel better. General instructions  If you your sexual partner is a woman, tell her that you have this infection. She needs to get treatment if she has symptoms. If you have a female partner, he does not need to be treated.  During treatment: ? Avoid sex. ? Do not douche. ? Avoid alcohol as told. ? Avoid breastfeeding as told.  Drink enough fluid to keep your pee (urine) clear or pale yellow.  Keep your vagina and butt (rectum) clean. ? Wash the area with warm water every day. ? Wipe from front to back after you use the toilet.  Keep all follow-up visits as told by your doctor. This is important. Preventing this condition  Do not douche.  Use only warm water to wash around your vagina.  Use protection when you have sex. This includes: ? Latex condoms. ? Dental dams.  Limit how many people you have sex with. It is best to only have sex with the same person (be monogamous).  Get tested for STIs. Have your partner get tested.  Wear underwear that is cotton or lined with cotton.  Avoid tight pants and pantyhose. This is most important in summer.  Do not use any products that have nicotine or tobacco in them. These include cigarettes and e-cigarettes. If you need help quitting, ask your doctor.  Do not use illegal drugs.  Limit how much alcohol you drink. Contact a doctor if:  Your symptoms do not get  better, even after you are treated.  You have more discharge or pain when you pee (urinate).  You have a fever.  You have pain in your belly (abdomen).  You have pain with sex.  Your bleed from your vagina between periods. Summary  This infection happens when too many germs (bacteria) grow in the vagina.  Treating this condition can lower your risk for some infections from sex (STIs).  You should also treat this if you are pregnant. It can cause early (premature) birth.  Do not stop taking or using your antibiotic medicine even if you start to feel better. This information is not intended to replace advice given to you by your health care provider. Make sure you discuss any questions you have with your health care provider. Document Released: 07/16/2008 Document Revised: 06/22/2016 Document Reviewed: 06/22/2016 Elsevier Interactive Patient Education  2017 [employer]  Constipation, Adult Constipation is when a person:  Poops (has a bowel movement) fewer times in a week than normal.  Has a hard time pooping.  Has poop that is dry, hard, or bigger than normal.  Follow these instructions at home: Eating and drinking   Eat foods that have a lot of fiber, such as: ? Fresh fruits and vegetables. ? Whole grains. ? Beans.  Eat less of foods that are high in fat, low in fiber, or overly processed, such as: ? [place] fries. ? Hamburgers. ? Cookies. ?  Candy. ? Soda.  Drink enough fluid to keep your pee (urine) clear or pale yellow. General instructions  Exercise regularly or as told by your doctor.  Go to the restroom when you feel like you need to poop. Do not hold it in.  Take over-the-counter and prescription medicines only as told by your doctor. These include any fiber supplements.  Do pelvic floor retraining exercises, such as: ? Doing deep breathing while relaxing your lower belly (abdomen). ? Relaxing your pelvic floor while pooping.  Watch your condition  for any changes.  Keep all follow-up visits as told by your doctor. This is important. Contact a doctor if:  You have pain that gets worse.  You have a fever.  You have not pooped for 4 days.  You throw up (vomit).  You are not hungry.  You lose weight.  You are bleeding from the anus.  You have thin, pencil-like poop (stool). Get help right away if:  You have a fever, and your symptoms suddenly get worse.  You leak poop or have blood in your poop.  Your belly feels hard or bigger than normal (is bloated).  You have very bad belly pain.  You feel dizzy or you faint. This information is not intended to replace advice given to you by your health care provider. Make sure you discuss any questions you have with your health care provider. Document Released: 03/25/2008 Document Revised: 04/26/2016 Document Reviewed: 03/27/2016 Elsevier Interactive Patient Education  2018 [employer].

## 2017-11-09 NOTE — MAU Provider Note (Signed)
History     CSN: [id number]  Arrival date and time: 11/09/17 1525   First Provider Initiated Contact with Patient 11/09/17 1609      Chief Complaint  Patient presents with  . Abdominal Pain   [id number] @[redacted]w[redacted]d  here with vaginal discharge, spotting, and abdominal pain. Abdominal pain started yesterday. Bilateral upper and lower quadrants, feels sharp, intermittent. No fevers. Rates 5/10. Has not taken anything for it. Constipation in past, had small BM today, doesn't feel like completed. Had small amt of lite red spotting on toilet paper this am, none since. Last IC 2 days ago. Feeling pressure with urination. No other urinary sx. Also reports white, thin, malodorous vaginal discharge. No new partner.    OB History    Gravida Para Term Preterm AB Living   4 2 1 1 1 1    SAB TAB Ectopic Multiple Live Births   1 0 0 0 1      Past Medical History:  Diagnosis Date  . Anxiety   . Asthma    "grew out"  . Chlamydia   . Depression    doing really good  . Gestational diabetes   . Hepatitis C June 2016  . History of heroin abuse   . Infection    UTI    Past Surgical History:  Procedure Laterality Date  . CESAREAN SECTION N/A 01/11/2016   Procedure: CESAREAN SECTION;  Surgeon: Brock Badharles A Harper, MD;  Location: [facility];  Service: Obstetrics;  Laterality: N/A;  . CESAREAN SECTION N/A 12/21/2016   Procedure: REPEAT CESAREAN SECTION;  Surgeon: Adam PhenixJames G Arnold, MD;  [facility];  Service: Obstetrics;  Laterality: N/A;    Family History  Problem Relation Age of Onset  . Diabetes Mother   . Pulmonary embolism Mother   . Stroke Mother   . Cancer Paternal Grandmother        liver  . Epilepsy Brother     Social History   Tobacco Use  . Smoking status: Current Some Day Smoker    Packs/day: 1.00    Types: Cigarettes  . Smokeless tobacco: Never Used  Substance Use Topics  . Alcohol use: No    Comment: Occas use.; none since mid Sept  . Drug use: Yes    Types:  Marijuana, Heroin    Allergies:  Allergies  Allergen Reactions  . Penicillins Other (See Comments)    Results thru an allergy skin test Has patient had a PCN reaction causing immediate rash, facial/tongue/throat swelling, SOB or lightheadedness with hypotension: No Has patient had a PCN reaction causing severe rash involving mucus membranes or skin necrosis: No Has patient had a PCN reaction that required hospitalization No Has patient had a PCN reaction occurring within the last 10 years: No If all of the above answers are "NO", then may proceed with Cephalosporin use.      Medications Prior to Admission  Medication Sig Dispense Refill Last Dose  . calcium carbonate (TUMS - DOSED IN MG ELEMENTAL CALCIUM) 500 MG chewable tablet Chew 2 tablets by mouth 4 (four) times daily as needed for indigestion or heartburn.   Past Week at Unknown time  . Prenatal Vit-Fe Fumarate-FA (PRENATAL COMPLETE) 14-0.4 MG TABS Take 1 tablet by mouth daily. 30 each 0 Past Week at Unknown time  . cephALEXin (KEFLEX) 500 MG capsule Take 1 capsule (500 mg total) by mouth 2 (two) times daily. (Patient not taking: Reported on 11/09/2017) 14 capsule 0 Completed Course at Unknown time  .  HYDROcodone-acetaminophen (NORCO/VICODIN) 5-325 MG tablet Take 1 tablet by mouth every 4 (four) hours as needed for moderate pain or severe pain. (Patient not taking: Reported on 11/09/2017) 10 tablet 0 Not Taking at Unknown time  . oxyCODONE-acetaminophen (PERCOCET/ROXICET) 5-325 MG tablet Take 1 tablet by mouth every 4 (four) hours as needed (pain scale > 7). (Patient not taking: Reported on 11/09/2017) 30 tablet 0 Not Taking at Unknown time    Review of Systems  Constitutional: Negative for fever.  Gastrointestinal: Positive for abdominal pain, constipation, nausea and vomiting. Negative for diarrhea.  Genitourinary: Positive for dysuria (pressure), vaginal bleeding and vaginal discharge. Negative for hematuria and urgency.   Musculoskeletal: Positive for back pain.   Physical Exam   Blood pressure 115/71, pulse 79, temperature 97.6 F (36.4 C), temperature source Oral, resp. rate 16, last menstrual period 08/22/2017, unknown if currently breastfeeding.  Physical Exam  Nursing note and vitals reviewed. Constitutional: She is oriented to person, place, and time. She appears well-developed and well-nourished.  HENT:  Head: Normocephalic and atraumatic.  Neck: Normal range of motion.  Cardiovascular: Normal rate.  Respiratory: Effort normal. No respiratory distress.  GI: Soft. She exhibits no distension and no mass. There is no tenderness. There is no rebound and no guarding.  Genitourinary:  Genitourinary Comments: External: no lesions or erythema Vagina: rugated, pink, moist, thin white frothy discharge, no blood, cervix closed/long    Musculoskeletal: Normal range of motion.  Neurological: She is alert and oriented to person, place, and time.  Skin: Skin is warm.  Psychiatric: She has a normal mood and affect.   Limited bedside US: viable, active fetus, +cardiac activity, subj. nml AFV  Results for orders placed or performed during the hospital encounter of 11/09/17 (from the past 24 hour(s))  Urinalysis, Routine w reflex microscopic     Status: Abnormal   Collection Time: 11/09/17  3:33 PM  Result Value Ref Range   Color, Urine YELLOW YELLOW   APPearance CLEAR CLEAR   Specific Gravity, Urine 1.009 1.005 - 1.030   pH 6.0 5.0 - 8.0   Glucose, UA NEGATIVE NEGATIVE mg/dL   Hgb urine dipstick NEGATIVE NEGATIVE   Bilirubin Urine NEGATIVE NEGATIVE   Ketones, ur 5 (A) NEGATIVE mg/dL   Protein, ur NEGATIVE NEGATIVE mg/dL   Nitrite NEGATIVE NEGATIVE   Leukocytes, UA NEGATIVE NEGATIVE  Wet prep, genital     Status: Abnormal   Collection Time: 11/09/17  3:38 PM  Result Value Ref Range   Yeast Wet Prep HPF POC NONE SEEN NONE SEEN   Trich, Wet Prep NONE SEEN NONE SEEN   Clue Cells Wet Prep HPF POC  PRESENT (A) NONE SEEN   WBC, Wet Prep HPF POC MODERATE (A) NONE SEEN   Sperm NONE SEEN    MAU Course  Procedures  MDM Labs ordered and reviewed. Abd pain likely d/t constipation, recommend Miralax and dietary changes. No evidence of UTI. Will treat for BV. Stable for discharge.  Assessment and Plan   1. [redacted] weeks gestation of pregnancy   2. Bacterial vaginosis   3. Slow transit constipation    Discharge home Follow up with OB provider of choice to start care Rx Flagyl Rx Miralax B6/Unisom (OTC) for N/V  Allergies as of 11/09/2017      Reactions   Penicillins Other (See Comments)   Results thru an allergy skin test Has patient had a PCN reaction causing immediate rash, facial/tongue/throat swelling, SOB or lightheadedness with hypotension: No Has patient had a  PCN reaction causing severe rash involving mucus membranes or skin necrosis: No Has patient had a PCN reaction that required hospitalization No Has patient had a PCN reaction occurring within the last 10 years: No If all of the above answers are "NO", then may proceed with Cephalosporin use.      Medication List    STOP taking these medications   cephALEXin 500 MG capsule Commonly known as:  KEFLEX   HYDROcodone-acetaminophen 5-325 MG tablet Commonly known as:  NORCO/VICODIN   oxyCODONE-acetaminophen 5-325 MG tablet Commonly known as:  PERCOCET/ROXICET     TAKE these medications   calcium carbonate 500 MG chewable tablet Commonly known as:  TUMS - dosed in mg elemental calcium Chew 2 tablets by mouth 4 (four) times daily as needed for indigestion or heartburn.   metroNIDAZOLE 500 MG tablet Commonly known as:  FLAGYL Take 1 tablet (500 mg total) by mouth 2 (two) times daily.   polyethylene glycol packet Commonly known as:  MIRALAX / GLYCOLAX Take 17 g by mouth daily.   PRENATAL COMPLETE 14-0.4 MG Tabs Take 1 tablet by mouth daily.      Donette Larry, CNM 11/09/2017, 4:57 PM

## 2017-11-10 LAB — GC/CHLAMYDIA PROBE AMP (~~LOC~~) NOT AT ARMC
CHLAMYDIA, DNA PROBE: NEGATIVE
Neisseria Gonorrhea: NEGATIVE

## 2017-11-20 DIAGNOSIS — Z3480 Encounter for supervision of other normal pregnancy, unspecified trimester: Secondary | ICD-10-CM | POA: Insufficient documentation

## 2018-01-30 ENCOUNTER — Emergency Department (HOSPITAL_COMMUNITY)
Admission: EM | Admit: 2018-01-30 | Discharge: 2018-01-30 | Disposition: A | Discharge status: Home / Self Care | Payer: 59 | Attending: Emergency Medicine | Admitting: Emergency Medicine

## 2018-01-30 ENCOUNTER — Other Ambulatory Visit: Payer: Self-pay

## 2018-01-30 ENCOUNTER — Encounter (HOSPITAL_COMMUNITY): Payer: Self-pay | Admitting: Emergency Medicine

## 2018-01-30 DIAGNOSIS — N390 Urinary tract infection, site not specified: Secondary | ICD-10-CM

## 2018-01-30 DIAGNOSIS — F17210 Nicotine dependence, cigarettes, uncomplicated: Secondary | ICD-10-CM | POA: Insufficient documentation

## 2018-01-30 DIAGNOSIS — Z79899 Other long term (current) drug therapy: Secondary | ICD-10-CM | POA: Insufficient documentation

## 2018-01-30 DIAGNOSIS — J45909 Unspecified asthma, uncomplicated: Secondary | ICD-10-CM | POA: Insufficient documentation

## 2018-01-30 DIAGNOSIS — R350 Frequency of micturition: Secondary | ICD-10-CM | POA: Diagnosis present

## 2018-01-30 LAB — CBC WITH DIFFERENTIAL/PLATELET
BASOS ABS: 0 10*3/uL (ref 0.0–0.1)
Basophils Relative: 0 %
Eosinophils Absolute: 0.2 10*3/uL (ref 0.0–0.7)
Eosinophils Relative: 2 %
HEMATOCRIT: 32.3 % — AB (ref 36.0–46.0)
Hemoglobin: 10.6 g/dL — ABNORMAL LOW (ref 12.0–15.0)
LYMPHS PCT: 29 %
Lymphs Abs: 2.3 10*3/uL (ref 0.7–4.0)
MCH: 31 pg (ref 26.0–34.0)
MCHC: 32.8 g/dL (ref 30.0–36.0)
MCV: 94.4 fL (ref 78.0–100.0)
MONO ABS: 0.6 10*3/uL (ref 0.1–1.0)
Monocytes Relative: 7 %
NEUTROS ABS: 4.8 10*3/uL (ref 1.7–7.7)
Neutrophils Relative %: 62 %
Platelets: 198 10*3/uL (ref 150–400)
RBC: 3.42 MIL/uL — AB (ref 3.87–5.11)
RDW: 13.6 % (ref 11.5–15.5)
WBC: 7.8 10*3/uL (ref 4.0–10.5)

## 2018-01-30 LAB — BASIC METABOLIC PANEL
ANION GAP: 10 (ref 5–15)
BUN: 12 mg/dL (ref 6–20)
CO2: 24 mmol/L (ref 22–32)
Calcium: 9.1 mg/dL (ref 8.9–10.3)
Chloride: 106 mmol/L (ref 101–111)
Creatinine, Ser: 0.5 mg/dL (ref 0.44–1.00)
GFR calc Af Amer: >60 mL/min (ref >60)
GFR calc non Af Amer: >60 mL/min (ref >60)
GLUCOSE: 91 mg/dL (ref 65–99)
Potassium: 4.2 mmol/L (ref 3.5–5.1)
Sodium: 140 mmol/L (ref 135–145)

## 2018-01-30 LAB — URINALYSIS, ROUTINE W REFLEX MICROSCOPIC
Bilirubin Urine: NEGATIVE
GLUCOSE, UA: NEGATIVE mg/dL
HGB URINE DIPSTICK: NEGATIVE
Ketones, ur: NEGATIVE mg/dL
NITRITE: NEGATIVE
PROTEIN: NEGATIVE mg/dL
Specific Gravity, Urine: 1.019 (ref 1.005–1.030)
pH: 7 (ref 5.0–8.0)

## 2018-01-30 MED ORDER — SODIUM CHLORIDE 0.9 % IV BOLUS
1000.0000 mL | Freq: Once | INTRAVENOUS | Status: AC
  Administered 2018-01-30: 1000 mL via INTRAVENOUS

## 2018-01-30 MED ORDER — CEPHALEXIN 500 MG PO CAPS: 500.0000 mg | ORAL_CAPSULE | Freq: Four times a day (QID) | ORAL | 0 refills | Status: DC

## 2018-01-30 MED ORDER — CEPHALEXIN 500 MG PO CAPS
500.0000 mg | ORAL_CAPSULE | Freq: Once | ORAL | Status: AC
  Administered 2018-01-30: 500 mg via ORAL
  Filled 2018-01-30: qty 1

## 2018-01-30 NOTE — Discharge Instructions (Addendum)
Follow-up with your OB/GYN provider next week.  If he develops severe abdominal pain vaginal bleeding or constant vomiting go to [facility] in [place]

## 2018-01-30 NOTE — ED Provider Notes (Signed)
[facility] EMERGENCY DEPARTMENT Provider Note   CSN: [id number] Arrival date & time: 01/30/18  1812     History   Chief Complaint Chief Complaint  Patient presents with  . Urinary Frequency    HPI Laurie [name] is a 22 y.o. female.  Patient complains of dysuria and also states that she feels like she may be having some minor contractions.  Patient is [redacted] weeks pregnant   The history is provided by the patient. No language interpreter was used.  Urinary Frequency  This is a new problem. The current episode started more than 2 days ago. The problem occurs constantly. The problem has not changed since onset.Pertinent negatives include no chest pain, no abdominal pain and no headaches. Nothing aggravates the symptoms. Nothing relieves the symptoms. She has tried nothing for the symptoms. The treatment provided no relief.    Past Medical History:  Diagnosis Date  . Anxiety   . Asthma    "grew out"  . Chlamydia   . Depression    doing really good  . Gestational diabetes   . Hepatitis C June 2016  . History of heroin abuse   . Infection    UTI    Patient Active Problem List   Diagnosis Date Noted  . Supervision of other normal pregnancy, antepartum 11/20/2017  . Postoperative anemia due to acute blood loss 12/22/2016  . Placental abruption 12/21/2016  . Gestational thrombocytopenia (HCC) 12/20/2016  . GDM (gestational diabetes mellitus) 11/28/2016  . Tobacco abuse 11/25/2016  . Rh negative state in antepartum period 11/25/2016  . Preterm premature rupture of membranes (PPROM) with unknown onset of labor 11/13/2016  . S/P preterm cesarean section for PPROM and abruption 01/12/2016  . Polysubstance abuse (HCC) 08/31/2015    Past Surgical History:  Procedure Laterality Date  . CESAREAN SECTION N/A 01/11/2016   Procedure: CESAREAN SECTION;  Surgeon: Brock Bad, MD;  Location: [facility];  Service: Obstetrics;  Laterality: N/A;  . CESAREAN SECTION N/A 12/21/2016   Procedure: REPEAT CESAREAN SECTION;  Surgeon: Adam Phenix, MD;  Location: [facility] BIRTHING SUITES;  Service: Obstetrics;  Laterality: N/A;     OB History    Gravida  4   Para  2   Term  1   Preterm  1   AB  1   Living  1     SAB  1   TAB  0   Ectopic  0   Multiple  0   Live Births  1            Home Medications    Prior to Admission medications   Medication Sig Start Date End Date Taking? Authorizing Provider  Prenatal Vit-Fe Fumarate-FA (PRENATAL COMPLETE) 14-0.4 MG TABS Take 1 tablet by mouth daily. 10/05/17  Yes Idol, Raynelle Fanning, PA-C  cephALEXin (KEFLEX) 500 MG capsule Take 1 capsule (500 mg total) by mouth 4 (four) times daily. 01/30/18   Bethann Berkshire, MD    Family History Family History  Problem Relation Age of Onset  . Diabetes Mother   . Pulmonary embolism Mother   . Stroke Mother   . Cancer Paternal Grandmother        liver  . Epilepsy Brother     Social History Social History   Tobacco Use  . Smoking status: Current Some Day Smoker    Packs/day: 1.00    Types: Cigarettes  . Smokeless tobacco: Never Used  Substance Use Topics  . Alcohol use: No  Comment: Occas use.; none since mid Sept  . Drug use: Yes    Types: Marijuana, Heroin     Allergies   Penicillins   Review of Systems Review of Systems  Constitutional: Negative for appetite change and fatigue.  HENT: Negative for congestion, ear discharge and sinus pressure.   Eyes: Negative for discharge.  Respiratory: Negative for cough.   Cardiovascular: Negative for chest pain.  Gastrointestinal: Negative for abdominal pain and diarrhea.  Genitourinary: Positive for dysuria and frequency. Negative for hematuria.  Musculoskeletal: Negative for back pain.  Skin: Negative for rash.  Neurological: Negative for seizures and headaches.  Psychiatric/Behavioral: Negative for hallucinations.     Physical Exam Updated Vital Signs BP (!) 99/58 (BP Location: Right Arm)   Pulse 91   Temp  98.7 F (37.1 C) (Oral)   Resp 18   Ht 5\' 3"  (1.6 m)   Wt 58.3 kg (128 lb 9.6 oz)   LMP 08/22/2017 (Approximate)   SpO2 100%   BMI 22.78 kg/m   Physical Exam  Constitutional: She is oriented to person, place, and time. She appears well-developed.  HENT:  Head: Normocephalic.  Eyes: Conjunctivae and EOM are normal. No scleral icterus.  Neck: Neck supple. No thyromegaly present.  Cardiovascular: Normal rate and regular rhythm. Exam reveals no gallop and no friction rub.  No murmur heard. Pulmonary/Chest: No stridor. She has no wheezes. She has no rales. She exhibits no tenderness.  Abdominal: She exhibits no distension. There is no tenderness. There is no rebound.  Musculoskeletal: Normal range of motion. She exhibits no edema.  Lymphadenopathy:    She has no cervical adenopathy.  Neurological: She is oriented to person, place, and time. She exhibits normal muscle tone. Coordination normal.  Skin: No rash noted. No erythema.  Psychiatric: She has a normal mood and affect. Her behavior is normal.     ED Treatments / Results  Labs (all labs ordered are listed, but only abnormal results are displayed) Labs Reviewed  URINALYSIS, ROUTINE W REFLEX MICROSCOPIC - Abnormal; Notable for the following components:      Result Value   APPearance CLOUDY (*)    Leukocytes, UA SMALL (*)    Bacteria, UA RARE (*)    Squamous Epithelial / LPF 6-30 (*)    All other components within normal limits  CBC WITH DIFFERENTIAL/PLATELET - Abnormal; Notable for the following components:   RBC 3.42 (*)    Hemoglobin 10.6 (*)    HCT 32.3 (*)    All other components within normal limits  URINE CULTURE  BASIC METABOLIC PANEL    EKG None  Radiology No results found.  Procedures Procedures (including critical care time)  Medications Ordered in ED Medications  cephALEXin (KEFLEX) capsule 500 mg (has no administration in time range)  sodium chloride 0.9 % bolus 1,000 mL (1,000 mLs Intravenous  New Bag/Given 01/30/18 1909)     Initial Impression / Assessment and Plan / ED Course  I have reviewed the triage vital signs and the nursing notes.  Pertinent labs & imaging results that were available during my care of the patient were reviewed by me and considered in my medical decision making (see chart for details).     Patient was placed on a monitor and it did not show any contractions.  Patient's urine showed some white blood cells.  She will have the urine cultured and she will be empirically treated with Keflex until she is followed up at the OB/GYN clinic  Final Clinical  Impressions(s) / ED Diagnoses   Final diagnoses:  Lower urinary tract infectious disease    ED Discharge Orders        Ordered    cephALEXin (KEFLEX) 500 MG capsule  4 times daily     01/30/18 2014       Bethann BerkshireZammit, Pride Gonzales, MD 01/30/18 2018

## 2018-01-30 NOTE — ED Triage Notes (Signed)
Pt c/o of lower back pain with urinary frequency, burning with urination, and odor with urine. Denies spotting or discharge

## 2018-01-30 NOTE — ED Notes (Signed)
Pt ambulatory to waiting room. Pt verbalized understanding of discharge instructions.   

## 2018-02-02 ENCOUNTER — Telehealth: Payer: Self-pay | Admitting: *Deleted

## 2018-02-02 LAB — URINE CULTURE

## 2018-02-02 NOTE — Telephone Encounter (Signed)
Patient states she went to the ED this weekend and was told she has a UTI.  Was prescribed some antibiotics but "doesn't feel like everything was checked".  States her BP is low and her "kidneys are hurting".  Informed patient that she needed to take the antibiotics.  Also informed that low blood pressure is not a problem unless she is symptomatic.  Patient also states she has not been able to get an appointment with us to be seen until next week.  I informed the patient, that we are a very busy practice and most appointments are a couple weeks out due to high volume.  Advised that I see she did have an appointment with us back in January but was a no show as well as an appointment in [place].  Patient states she and her husband moved to [place] in which she was going to get care there but then her husband was working on the road so she went with him and now they are back. Informed patient that I understood but we would get her in as soon as we could.  Advised her to keep scheduled appointment on 4/23.  Verbalized understanding with no further questions.

## 2018-02-10 ENCOUNTER — Ambulatory Visit: Payer: 59 | Admitting: *Deleted

## 2018-02-10 ENCOUNTER — Ambulatory Visit (INDEPENDENT_AMBULATORY_CARE_PROVIDER_SITE_OTHER): Payer: 59 | Admitting: Advanced Practice Midwife

## 2018-02-10 ENCOUNTER — Other Ambulatory Visit (HOSPITAL_COMMUNITY)
Admission: RE | Admit: 2018-02-10 | Discharge: 2018-02-10 | Disposition: A | Discharge status: Home / Self Care | Payer: 59 | Source: Ambulatory Visit | Attending: Advanced Practice Midwife | Admitting: Advanced Practice Midwife

## 2018-02-10 ENCOUNTER — Encounter: Payer: Self-pay | Admitting: Advanced Practice Midwife

## 2018-02-10 VITALS — BP 108/58 | HR 68 | Wt 132.0 lb

## 2018-02-10 DIAGNOSIS — O09292 Supervision of pregnancy with other poor reproductive or obstetric history, second trimester: Secondary | ICD-10-CM | POA: Diagnosis not present

## 2018-02-10 DIAGNOSIS — O09212 Supervision of pregnancy with history of pre-term labor, second trimester: Secondary | ICD-10-CM | POA: Diagnosis not present

## 2018-02-10 DIAGNOSIS — B182 Chronic viral hepatitis C: Secondary | ICD-10-CM

## 2018-02-10 DIAGNOSIS — Z3490 Encounter for supervision of normal pregnancy, unspecified, unspecified trimester: Secondary | ICD-10-CM | POA: Insufficient documentation

## 2018-02-10 DIAGNOSIS — O26892 Other specified pregnancy related conditions, second trimester: Secondary | ICD-10-CM | POA: Diagnosis not present

## 2018-02-10 DIAGNOSIS — Z3A22 22 weeks gestation of pregnancy: Secondary | ICD-10-CM

## 2018-02-10 DIAGNOSIS — Z124 Encounter for screening for malignant neoplasm of cervix: Secondary | ICD-10-CM | POA: Diagnosis not present

## 2018-02-10 DIAGNOSIS — Z3482 Encounter for supervision of other normal pregnancy, second trimester: Secondary | ICD-10-CM

## 2018-02-10 DIAGNOSIS — B1920 Unspecified viral hepatitis C without hepatic coma: Secondary | ICD-10-CM

## 2018-02-10 DIAGNOSIS — O98412 Viral hepatitis complicating pregnancy, second trimester: Secondary | ICD-10-CM

## 2018-02-10 DIAGNOSIS — Z1389 Encounter for screening for other disorder: Secondary | ICD-10-CM

## 2018-02-10 DIAGNOSIS — Z8632 Personal history of gestational diabetes: Secondary | ICD-10-CM

## 2018-02-10 DIAGNOSIS — L299 Pruritus, unspecified: Secondary | ICD-10-CM

## 2018-02-10 DIAGNOSIS — Z1379 Encounter for other screening for genetic and chromosomal anomalies: Secondary | ICD-10-CM

## 2018-02-10 DIAGNOSIS — Z363 Encounter for antenatal screening for malformations: Secondary | ICD-10-CM

## 2018-02-10 DIAGNOSIS — O34219 Maternal care for unspecified type scar from previous cesarean delivery: Secondary | ICD-10-CM | POA: Diagnosis not present

## 2018-02-10 DIAGNOSIS — Z331 Pregnant state, incidental: Secondary | ICD-10-CM | POA: Diagnosis not present

## 2018-02-10 LAB — POCT URINALYSIS DIPSTICK
GLUCOSE UA: NEGATIVE
Ketones, UA: NEGATIVE
LEUKOCYTES UA: NEGATIVE
Nitrite, UA: NEGATIVE
Protein, UA: NEGATIVE
RBC UA: NEGATIVE

## 2018-02-10 NOTE — Progress Notes (Signed)
Subjective:    Laurie [name] is a [id number] [redacted]w[redacted]d being seen today for her first obstetrical visit.  Her obstetrical history is significant for CS X2; PPROM @33  weeks   Was admitted to ante for 2 months w/PPROM at 29 weeks and had CS at 33 weeks for worsening chronic abruption 13 months ago. Had gestational thrombocytopenia and GDM last pregnacy  Pregnancy history fully reviewed.  Patient reports Palms itching for a few weeks, bruises easily. Platelets 198 2 weeks ago. Has known hep c, worried about nausea SE of cure.  Encouraged to seek tx after pregnancy.    Discussed Makena. Wants to start ASAP. Faxed in order, can start tomorrow when she comes back for [place]  Vitals:   02/10/18 1454  BP: (!) 108/58  Pulse: 68  Weight: 132 lb (59.9 kg)    HISTORY: OB History  Gravida Para Term Preterm AB Living  4 2 1 1 1 1   SAB TAB Ectopic Multiple Live Births  1 0 0 0 1    # Outcome Date GA Lbr Len/2nd Weight Sex Delivery Anes PTL Lv  4 Current           3 Preterm 12/21/16 [redacted]w[redacted]d  3 lb 15.9 oz (1.81 kg) M CS-LTranv Spinal N LIV     Complications: History of preterm premature rupture of membranes (PPROM), Gestational diabetes  2 Term 01/11/16 [redacted]w[redacted]d  8 lb 15.9 oz (4.08 kg) F CS-LTranv EPI N LIV     Complications: Gestational diabetes  1 SAB 2016           Past Medical History:  Diagnosis Date  . Anxiety   . Asthma    "grew out"  . Chlamydia   . Depression    doing really good  . Gestational diabetes   . Hepatitis C June 2016  . History of heroin abuse   . Infection    UTI   Past Surgical History:  Procedure Laterality Date  . CESAREAN SECTION N/A 01/11/2016   Procedure: CESAREAN SECTION;  Surgeon: Brock Bad, MD;  Location: [facility];  Service: Obstetrics;  Laterality: N/A;  . CESAREAN SECTION N/A 12/21/2016   Procedure: REPEAT CESAREAN SECTION;  Surgeon: Adam Phenix, MD;  Location: [facility]IRTHING SUIT[facility];  Service: Obstetrics;  Laterality: N/A;   Family History  Problem Relation Age  of Onset  . Diabetes Mother   . Pulmonary embolism Mother   . Stroke Mother   . Cancer Paternal Grandmother        liver  . Epilepsy Brother      Exam       Pelvic Exam:    Perineum: Normal Perineum   Vulva: normal   Vagina:  normal mucosa, normal discharge, no palpable nodules   Uterus Normal, Gravid, FH: 160     Cervix: normal   Adnexa: Not palpable   Urinary:  urethral meatus normal    System:     Skin: normal coloration and turgor, no rashes    Neurologic: oriented, normal, normal mood   Extremities: normal strength, tone, and muscle mass   HEENT PERRLA   Mouth/Teeth mucous membranes moist, poor dentition   Neck supple and no masses   Cardiovascular: regular rate and rhythm   Respiratory:  appears well, vitals normal, no respiratory distress, acyanotic   Abdomen: soft, non-tender;  FHR: 145        The nature of [facility] with multiple MDs and other Advanced Practice Providers  was explained to patient; also emphasized that residents, students are part of our team.  Assessment:    Pregnancy: [id number] Patient Active Problem List   Diagnosis Date Noted  . Supervision of normal pregnancy 02/10/2018  . Gestational thrombocytopenia (HCC) 12/20/2016  . Tobacco abuse 11/25/2016  . Rh negative state in antepartum period 11/25/2016  . Polysubstance abuse (HCC) 08/31/2015  . Hepatitis C 03/22/2015    /.    Plan:      Continue prenatal vitamins  Discussed 17p:  Pt to start tomorrow, ordered today Check Bile acids tomorrow when fasting Problem list reviewed and updated  Initial labs drawn. Hgb A1c and bile acids added to labs Reviewed recommended weight gain based on pre-gravid BMI  Encouraged well-balanced diet Genetic Screening discussed too late: Marland Kitchen.  Ultrasound discussed; fetal survey: requested.  Return for asap for anatomy scan only and bile acids; 4 weeks for LROB.  Jacklyn ShellFrances Cresenzo-Dishmon 02/10/2018

## 2018-02-10 NOTE — Patient Instructions (Signed)
Safe Medications in Pregnancy   Acne: Benzoyl Peroxide Salicylic Acid  Backache/Headache: Tylenol: 2 regular strength every 4 hours OR              2 Extra strength every 6 hours  Colds/Coughs/Allergies: Benadryl (alcohol free) 25 mg every 6 hours as needed Breath right strips Claritin Cepacol throat lozenges Chloraseptic throat spray Cold-Eeze- up to three times per day Cough drops, alcohol free Flonase (by prescription only) Guaifenesin Mucinex Robitussin DM (plain only, alcohol free) Saline nasal spray/drops Sudafed (pseudoephedrine) & Actifed ** use only after [redacted] weeks gestation and if you do not have high blood pressure Tylenol Vicks Vaporub Zinc lozenges Zyrtec   Constipation: Colace Ducolax suppositories Fleet enema Glycerin suppositories Metamucil Milk of magnesia Miralax Senokot Smooth move tea  Diarrhea: Kaopectate Imodium A-D  *NO pepto Bismol  Hemorrhoids: Anusol Anusol HC Preparation H Tucks  Indigestion: Tums Maalox Mylanta Zantac  Pepcid  Insomnia: Benadryl (alcohol free) 25mg  every 6 hours as needed Tylenol PM Unisom, no Gelcaps  Leg Cramps: Tums MagGel  Nausea/Vomiting:  Bonine Dramamine Emetrol Ginger extract Sea bands Meclizine  Nausea medication to take during pregnancy:  Unisom (doxylamine succinate 25 mg tablets) Take one tablet daily at bedtime. If symptoms are not adequately controlled, the dose can be increased to a maximum recommended dose of two tablets daily (1/2 tablet in the morning, 1/2 tablet mid-afternoon and one at bedtime). Vitamin B6 100mg  tablets. Take one tablet twice a day (up to 200 mg per day).  Skin Rashes: Aveeno products Benadryl cream or 25mg  every 6 hours as needed Calamine Lotion 1% cortisone cream  Yeast infection: Gyne-lotrimin 7 Monistat 7   **If taking multiple medications, please check labels to avoid duplicating the same active ingredients **take medication as directed on  the label ** Do not exceed 4000 mg of tylenol in 24 hours **Do not take medications that contain aspirin or ibuprofen     Deeann E Feider, I greatly value your feedback.  If you receive a survey following your visit with us today, we appreciate you taking the time to fill it out.  Thanks, Cathie BeamsFran Cresenzo-Dishmon, CNM     Second Trimester of Pregnancy The second trimester is from week 14 through week 27 (months 4 through 6). The second trimester is often a time when you feel your best. Your body has adjusted to being pregnant, and you begin to feel better physically. Usually, morning sickness has lessened or quit completely, you may have more energy, and you may have an increase in appetite. The second trimester is also a time when the fetus is growing rapidly. At the end of the sixth month, the fetus is about 9 inches long and weighs about 1 pounds. You will likely begin to feel the baby move (quickening) between 16 and 20 weeks of pregnancy. Body changes during your second trimester Your body continues to go through many changes during your second trimester. The changes vary from woman to woman.  Your weight will continue to increase. You will notice your lower abdomen bulging out.  You may begin to get stretch marks on your hips, abdomen, and breasts.  You may develop headaches that can be relieved by medicines. The medicines should be approved by your health care provider.  You may urinate more often because the fetus is pressing on your bladder.  You may develop or continue to have heartburn as a result of your pregnancy.  You may develop constipation because certain hormones are causing the  muscles that push waste through your intestines to slow down.  You may develop hemorrhoids or swollen, bulging veins (varicose veins).  You may have back pain. This is caused by: ? Weight gain. ? Pregnancy hormones that are relaxing the joints in your pelvis. ? A shift in weight and the  muscles that support your balance.  Your breasts will continue to grow and they will continue to become tender.  Your gums may bleed and may be sensitive to brushing and flossing.  Dark spots or blotches (chloasma, mask of pregnancy) may develop on your face. This will likely fade after the baby is born.  A dark line from your belly button to the pubic area (linea nigra) may appear. This will likely fade after the baby is born.  You may have changes in your hair. These can include thickening of your hair, rapid growth, and changes in texture. Some women also have hair loss during or after pregnancy, or hair that feels dry or thin. Your hair will most likely return to normal after your baby is born.  What to expect at prenatal visits During a routine prenatal visit:  You will be weighed to make sure you and the fetus are growing normally.  Your blood pressure will be taken.  Your abdomen will be measured to track your baby's growth.  The fetal heartbeat will be listened to.  Any test results from the previous visit will be discussed.  Your health care provider may ask you:  How you are feeling.  If you are feeling the baby move.  If you have had any abnormal symptoms, such as leaking fluid, bleeding, severe headaches, or abdominal cramping.  If you are using any tobacco products, including cigarettes, chewing tobacco, and electronic cigarettes.  If you have any questions.  Other tests that may be performed during your second trimester include:  Blood tests that check for: ? Low iron levels (anemia). ? High blood sugar that affects pregnant women (gestational diabetes) between 46 and 28 weeks. ? Rh antibodies. This is to check for a protein on red blood cells (Rh factor).  Urine tests to check for infections, diabetes, or protein in the urine.  An ultrasound to confirm the proper growth and development of the baby.  An amniocentesis to check for possible genetic  problems.  Fetal screens for spina bifida and Down syndrome.  HIV (human immunodeficiency virus) testing. Routine prenatal testing includes screening for HIV, unless you choose not to have this test.  Follow these instructions at home: Medicines  Follow your health care provider's instructions regarding medicine use. Specific medicines may be either safe or unsafe to take during pregnancy.  Take a prenatal vitamin that contains at least 600 micrograms (mcg) of folic acid.  If you develop constipation, try taking a stool softener if your health care provider approves. Eating and drinking  Eat a balanced diet that includes fresh fruits and vegetables, whole grains, good sources of protein such as meat, eggs, or tofu, and low-fat dairy. Your health care provider will help you determine the amount of weight gain that is right for you.  Avoid raw meat and uncooked cheese. These carry germs that can cause birth defects in the baby.  If you have low calcium intake from food, talk to your health care provider about whether you should take a daily calcium supplement.  Limit foods that are high in fat and processed sugars, such as fried and sweet foods.  To prevent constipation: ? Drink  enough fluid to keep your urine clear or pale yellow. ? Eat foods that are high in fiber, such as fresh fruits and vegetables, whole grains, and beans. Activity  Exercise only as directed by your health care provider. Most women can continue their usual exercise routine during pregnancy. Try to exercise for 30 minutes at least 5 days a week. Stop exercising if you experience uterine contractions.  Avoid heavy lifting, wear low heel shoes, and practice good posture.  A sexual relationship may be continued unless your health care provider directs you otherwise. Relieving pain and discomfort  Wear a good support bra to prevent discomfort from breast tenderness.  Take warm sitz baths to soothe any pain or  discomfort caused by hemorrhoids. Use hemorrhoid cream if your health care provider approves.  Rest with your legs elevated if you have leg cramps or low back pain.  If you develop varicose veins, wear support hose. Elevate your feet for 15 minutes, 3-4 times a day. Limit salt in your diet. Prenatal Care  Write down your questions. Take them to your prenatal visits.  Keep all your prenatal visits as told by your health care provider. This is important. Safety  Wear your seat belt at all times when driving.  Make a list of emergency phone numbers, including numbers for family, friends, the hospital, and police and fire departments. General instructions  Ask your health care provider for a referral to a local prenatal education class. Begin classes no later than the beginning of month 6 of your pregnancy.  Ask for help if you have counseling or nutritional needs during pregnancy. Your health care provider can offer advice or refer you to specialists for help with various needs.  Do not use hot tubs, steam rooms, or saunas.  Do not douche or use tampons or scented sanitary pads.  Do not cross your legs for long periods of time.  Avoid cat litter boxes and soil used by cats. These carry germs that can cause birth defects in the baby and possibly loss of the fetus by miscarriage or stillbirth.  Avoid all smoking, herbs, alcohol, and unprescribed drugs. Chemicals in these products can affect the formation and growth of the baby.  Do not use any products that contain nicotine or tobacco, such as cigarettes and e-cigarettes. If you need help quitting, ask your health care provider.  Visit your dentist if you have not gone yet during your pregnancy. Use a soft toothbrush to brush your teeth and be gentle when you floss. Contact a health care provider if:  You have dizziness.  You have mild pelvic cramps, pelvic pressure, or nagging pain in the abdominal area.  You have persistent  nausea, vomiting, or diarrhea.  You have a bad smelling vaginal discharge.  You have pain when you urinate. Get help right away if:  You have a fever.  You are leaking fluid from your vagina.  You have spotting or bleeding from your vagina.  You have severe abdominal cramping or pain.  You have rapid weight gain or weight loss.  You have shortness of breath with chest pain.  You notice sudden or extreme swelling of your face, hands, ankles, feet, or legs.  You have not felt your baby move in over an hour.  You have severe headaches that do not go away when you take medicine.  You have vision changes. Summary  The second trimester is from week 14 through week 27 (months 4 through 6). It is also a  time when the fetus is growing rapidly.  Your body goes through many changes during pregnancy. The changes vary from woman to woman.  Avoid all smoking, herbs, alcohol, and unprescribed drugs. These chemicals affect the formation and growth your baby.  Do not use any tobacco products, such as cigarettes, chewing tobacco, and e-cigarettes. If you need help quitting, ask your health care provider.  Contact your health care provider if you have any questions. Keep all prenatal visits as told by your health care provider. This is important. This information is not intended to replace advice given to you by your health care provider. Make sure you discuss any questions you have with your health care provider.      CHILDBIRTH CLASSES [phone] is the phone number for Pregnancy Classes or hospital tours at [facility].   You will be referred to  [email or web address] for more information on childbirth classes  At this site you may register for classes. You may sign up for a waiting list if classes are full. Please SIGN UP FOR THIS!.   When the waiting list becomes long, sometimes new classes  can be added.

## 2018-02-11 ENCOUNTER — Ambulatory Visit (INDEPENDENT_AMBULATORY_CARE_PROVIDER_SITE_OTHER): Payer: 59

## 2018-02-11 DIAGNOSIS — Z3402 Encounter for supervision of normal first pregnancy, second trimester: Secondary | ICD-10-CM

## 2018-02-11 DIAGNOSIS — Z363 Encounter for antenatal screening for malformations: Secondary | ICD-10-CM

## 2018-02-11 LAB — PMP SCREEN PROFILE (10S), URINE
Amphetamine Scrn, Ur: NEGATIVE ng/mL
BARBITURATE SCREEN URINE: NEGATIVE ng/mL
BENZODIAZEPINE SCREEN, URINE: NEGATIVE ng/mL
CANNABINOIDS UR QL SCN: POSITIVE ng/mL — AB
CREATININE(CRT), U: 150.5 mg/dL (ref 20.0–300.0)
Cocaine (Metab) Scrn, Ur: NEGATIVE ng/mL
Methadone Screen, Urine: NEGATIVE ng/mL
OXYCODONE+OXYMORPHONE UR QL SCN: NEGATIVE ng/mL
Opiate Scrn, Ur: NEGATIVE ng/mL
PH UR, DRUG SCRN: 6.1 (ref 4.5–8.9)
PROPOXYPHENE SCREEN URINE: NEGATIVE ng/mL
Phencyclidine Qn, Ur: NEGATIVE ng/mL

## 2018-02-11 NOTE — Progress Notes (Signed)
US 22+5 wks,breech,posterior placenta grade 0,cervical length 4.5 cm,normal ovaries bilat,single vertical pocket of fluid 4.5 cm,LVEICF,FHR 148 bpm,EFW 510 g 33%,anatomy complete,no obvious abnormalities

## 2018-02-12 LAB — CYTOLOGY - PAP
Chlamydia: NEGATIVE
Diagnosis: NEGATIVE
Neisseria Gonorrhea: NEGATIVE

## 2018-02-12 LAB — URINE CULTURE

## 2018-03-10 ENCOUNTER — Encounter: Payer: 59 | Admitting: Obstetrics & Gynecology

## 2018-03-16 IMAGING — US US OB COMP LESS 14 WK
1 series · 13 of 28 positions shown · non-contrast
Comparison: None this pregnancy.

CLINICAL DATA: Pregnant patient in first-trimester pregnancy with
pelvic pain.

EXAM:
OBSTETRIC <14 WK US AND TRANSVAGINAL OB US
TECHNIQUE: Both transabdominal and transvaginal ultrasound examinations were
performed for complete evaluation of the gestation as well as the
maternal uterus, adnexal regions, and pelvic cul-de-sac.
Transvaginal technique was performed to assess early pregnancy.

[Series 1: us ob comp less 14 wk · 0.23mm/px · 13 of 115 slices shown]
[im 5/115]
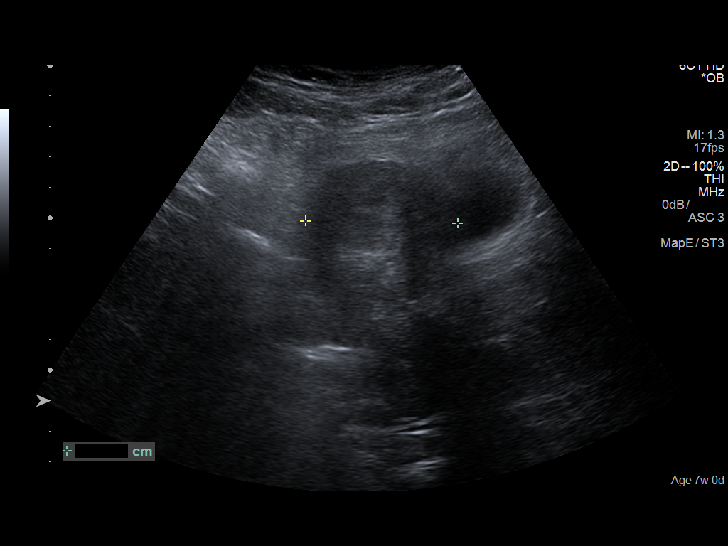
[im 13/115]
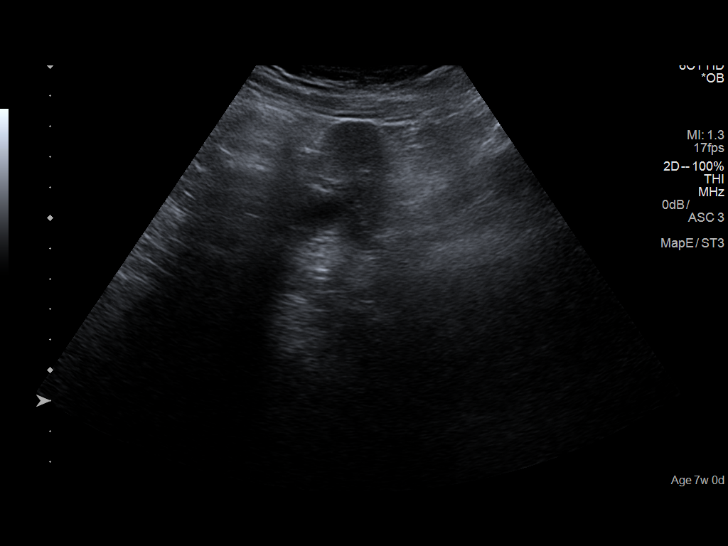
[im 22/115]
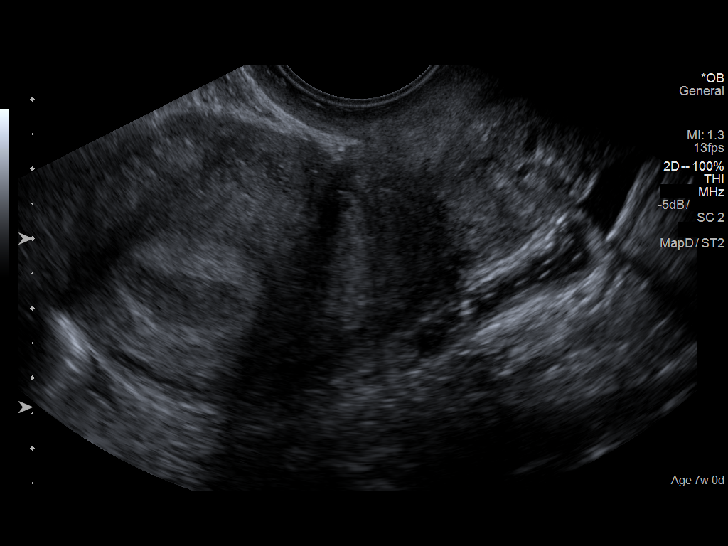
[im 30/115]
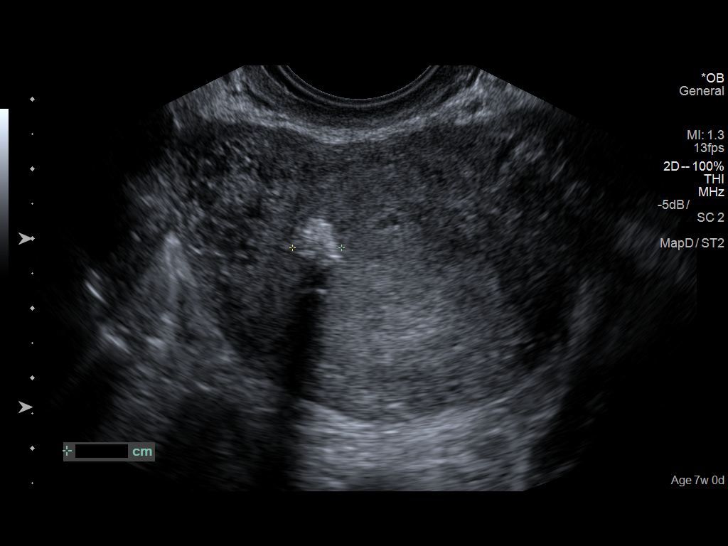
[im 39/115]
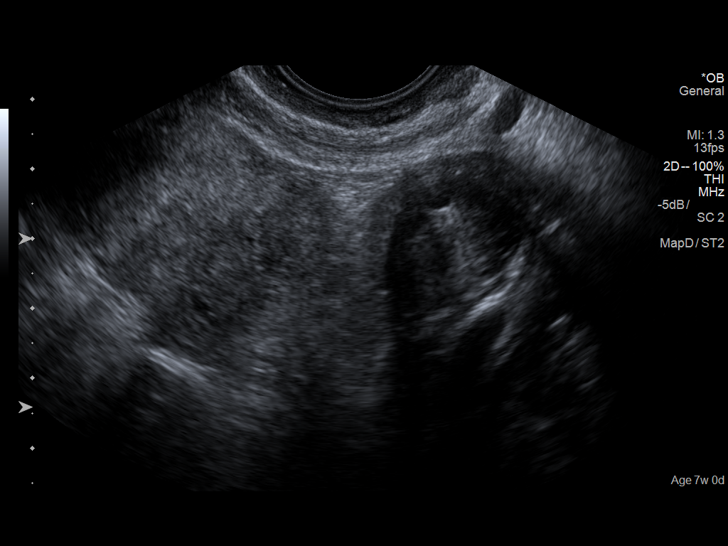
[im 47/115]
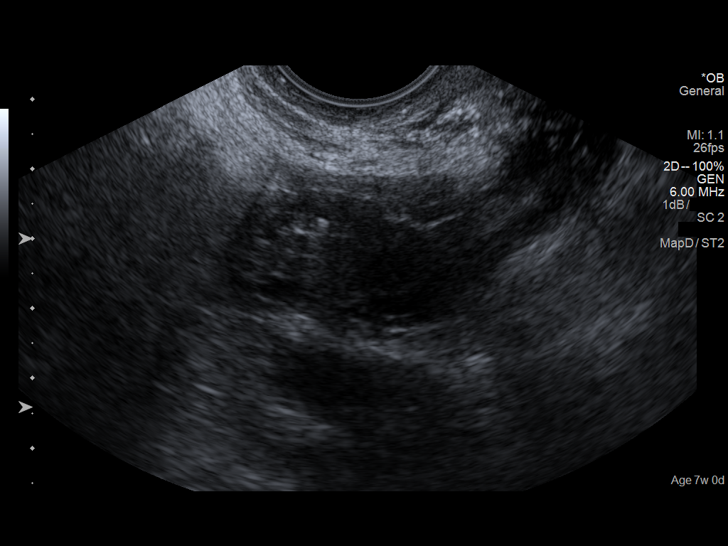
[im 60/115]
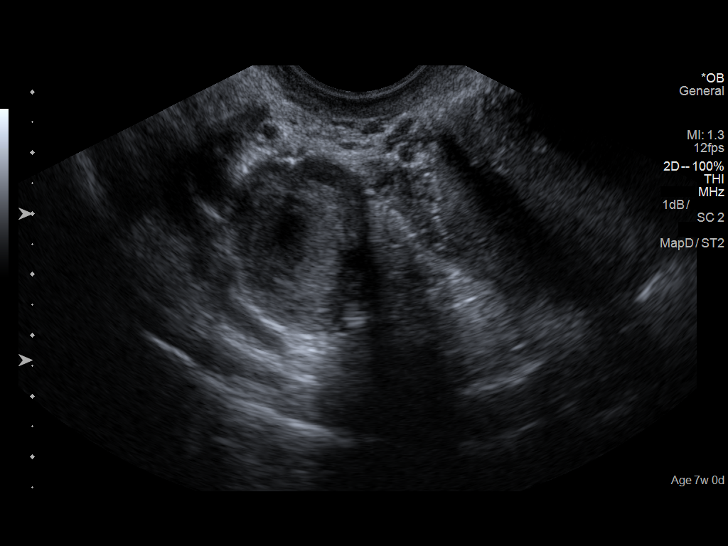
[im 68/115]
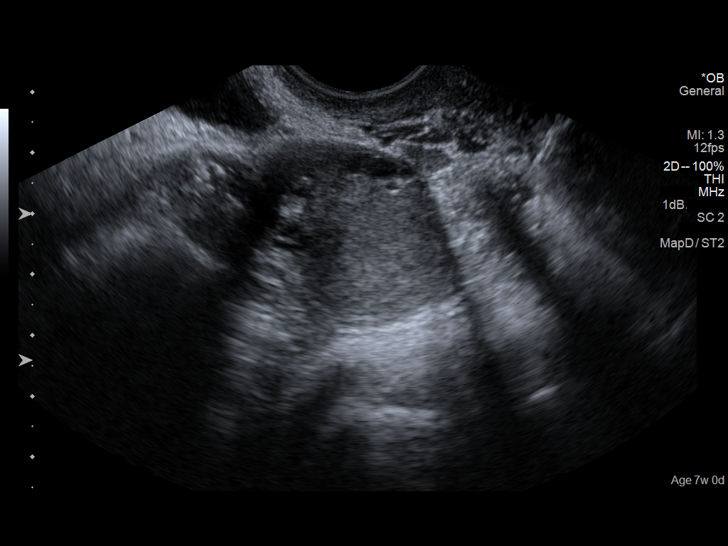
[im 77/115]
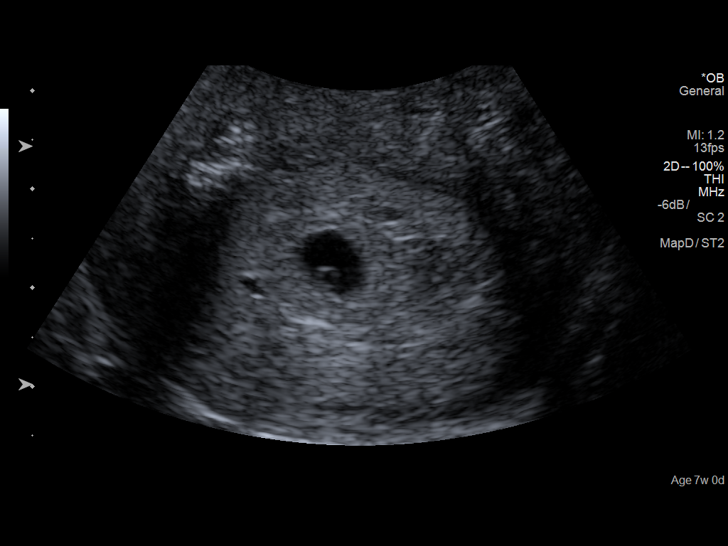
[im 85/115]
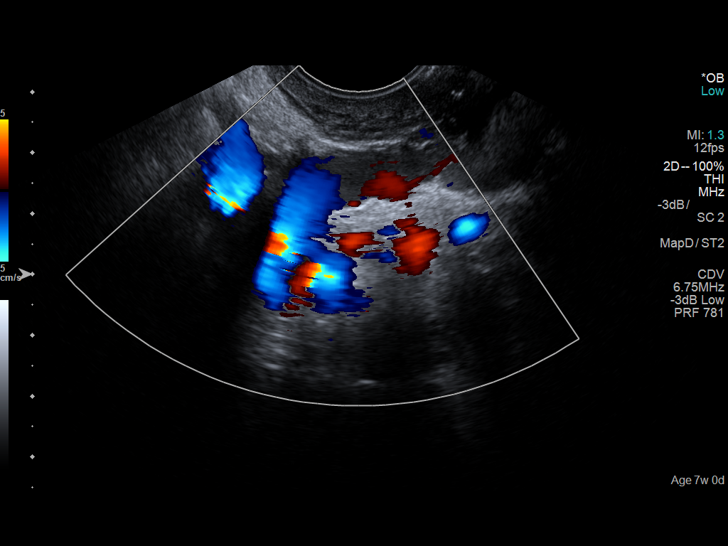
[im 93/115]
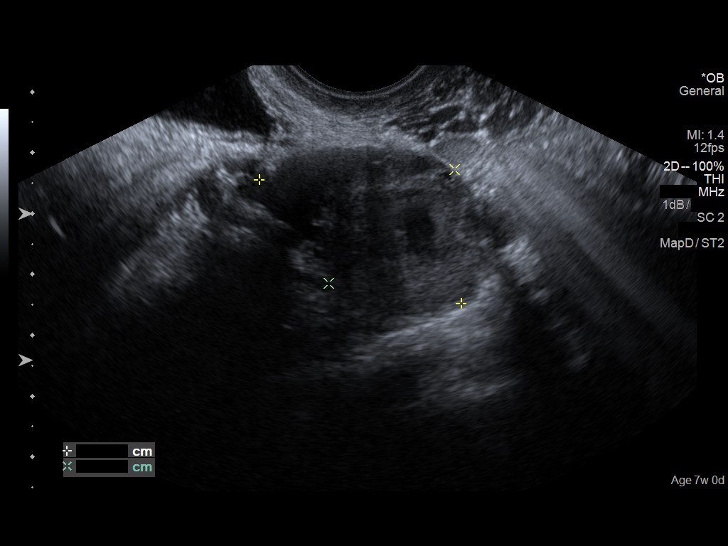
[im 102/115]
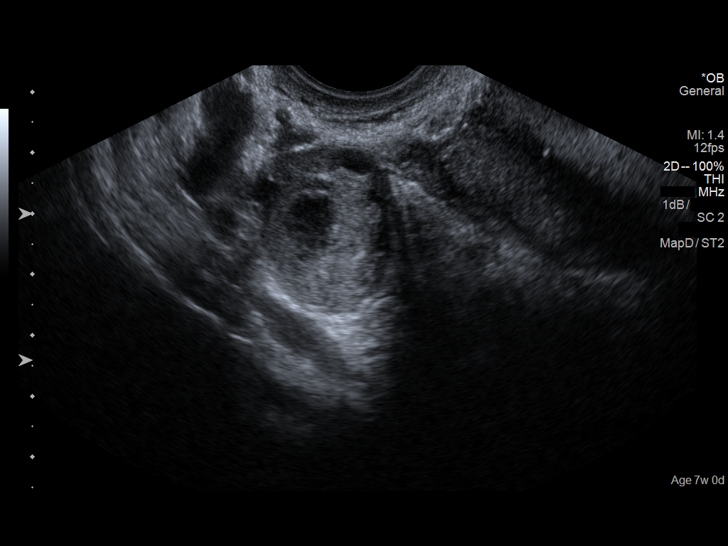
[im 110/115]
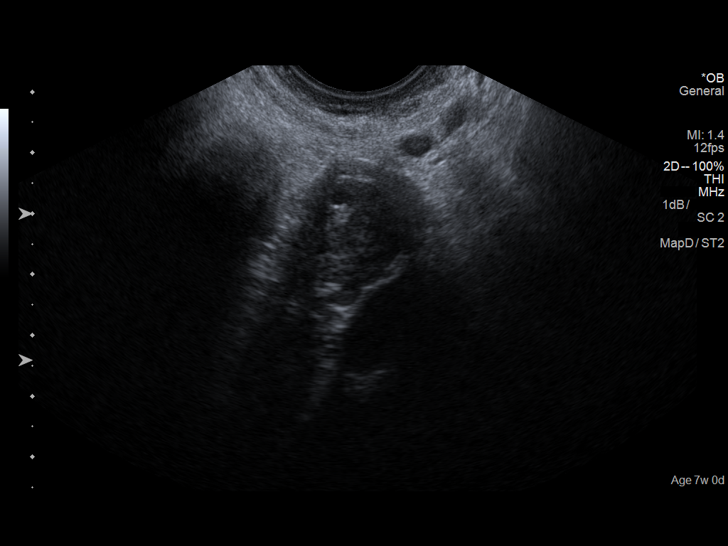

[13 of 28 positions shown; findings below may reference images not displayed]

FINDINGS: Intrauterine gestational sac: Single

Yolk sac:  Visualized.

Embryo:  Visualized.

Cardiac Activity: Not Visualized.

CRL:  2  mm   5 w   5 d                  US EDC: 06/07/2017

Subchorionic hemorrhage:  None visualized.

Maternal uterus/adnexae: Shadowing 7 mm calcification in the
myometrium. Both ovaries are well-visualized and are normal.
Probable corpus luteal cyst in the right ovary. No adnexal mass or
pelvic free fluid.
IMPRESSION: Single intrauterine gestation, fetal pole measuring 2 mm without
fetal heart tones. Findings are suspicious but not yet definitive
for failed pregnancy. Recommend follow-up US in 10-14 days for
definitive diagnosis. This recommendation follows SRU consensus
guidelines: Diagnostic Criteria for Nonviable Pregnancy Early in the
First Trimester. N Engl J Med 6863; [DATE].

## 2018-04-07 ENCOUNTER — Encounter: Payer: 59 | Admitting: Obstetrics & Gynecology

## 2018-04-07 ENCOUNTER — Encounter: Payer: Self-pay | Admitting: *Deleted

## 2018-04-10 ENCOUNTER — Encounter: Payer: 59 | Admitting: Obstetrics & Gynecology

## 2018-05-09 ENCOUNTER — Inpatient Hospital Stay (HOSPITAL_COMMUNITY)
Admission: AD | Admit: 2018-05-09 | Discharge: 2018-05-09 | Disposition: A | Discharge status: Home / Self Care | Payer: 59 | Source: Ambulatory Visit | Attending: Obstetrics and Gynecology | Admitting: Obstetrics and Gynecology

## 2018-05-09 ENCOUNTER — Encounter (HOSPITAL_COMMUNITY): Payer: Self-pay | Admitting: *Deleted

## 2018-05-09 DIAGNOSIS — O99323 Drug use complicating pregnancy, third trimester: Secondary | ICD-10-CM | POA: Diagnosis not present

## 2018-05-09 DIAGNOSIS — Z880 Allergy status to penicillin: Secondary | ICD-10-CM | POA: Insufficient documentation

## 2018-05-09 DIAGNOSIS — Z3A35 35 weeks gestation of pregnancy: Secondary | ICD-10-CM | POA: Insufficient documentation

## 2018-05-09 DIAGNOSIS — F17210 Nicotine dependence, cigarettes, uncomplicated: Secondary | ICD-10-CM | POA: Insufficient documentation

## 2018-05-09 DIAGNOSIS — O34219 Maternal care for unspecified type scar from previous cesarean delivery: Secondary | ICD-10-CM | POA: Diagnosis not present

## 2018-05-09 DIAGNOSIS — R109 Unspecified abdominal pain: Secondary | ICD-10-CM | POA: Insufficient documentation

## 2018-05-09 DIAGNOSIS — O0933 Supervision of pregnancy with insufficient antenatal care, third trimester: Secondary | ICD-10-CM | POA: Diagnosis not present

## 2018-05-09 DIAGNOSIS — O99333 Smoking (tobacco) complicating pregnancy, third trimester: Secondary | ICD-10-CM | POA: Diagnosis not present

## 2018-05-09 DIAGNOSIS — O26893 Other specified pregnancy related conditions, third trimester: Secondary | ICD-10-CM | POA: Diagnosis not present

## 2018-05-09 LAB — RAPID URINE DRUG SCREEN, HOSP PERFORMED
Amphetamines: POSITIVE — AB
BENZODIAZEPINES: NOT DETECTED
Barbiturates: NOT DETECTED
Cocaine: NOT DETECTED
Opiates: NOT DETECTED
Tetrahydrocannabinol: POSITIVE — AB

## 2018-05-09 LAB — CBC
HCT: 29.9 % — ABNORMAL LOW (ref 36.0–46.0)
Hemoglobin: 9.8 g/dL — ABNORMAL LOW (ref 12.0–15.0)
MCH: 28.2 pg (ref 26.0–34.0)
MCHC: 32.8 g/dL (ref 30.0–36.0)
MCV: 86.2 fL (ref 78.0–100.0)
PLATELETS: 150 10*3/uL (ref 150–400)
RBC: 3.47 MIL/uL — ABNORMAL LOW (ref 3.87–5.11)
RDW: 14 % (ref 11.5–15.5)
WBC: 7.4 10*3/uL (ref 4.0–10.5)

## 2018-05-09 LAB — DIFFERENTIAL
BASOS PCT: 0 %
Basophils Absolute: 0 10*3/uL (ref 0.0–0.1)
EOS PCT: 1 %
Eosinophils Absolute: 0.1 10*3/uL (ref 0.0–0.7)
Lymphocytes Relative: 41 %
Lymphs Abs: 3.1 10*3/uL (ref 0.7–4.0)
MONO ABS: 0.4 10*3/uL (ref 0.1–1.0)
Monocytes Relative: 5 %
NEUTROS ABS: 3.9 10*3/uL (ref 1.7–7.7)
Neutrophils Relative %: 53 %

## 2018-05-09 LAB — URINALYSIS, ROUTINE W REFLEX MICROSCOPIC
BACTERIA UA: NONE SEEN
Glucose, UA: NEGATIVE mg/dL
Hgb urine dipstick: NEGATIVE
Ketones, ur: NEGATIVE mg/dL
Nitrite: NEGATIVE
Protein, ur: 30 mg/dL — AB
Specific Gravity, Urine: 1.028 (ref 1.005–1.030)
pH: 6 (ref 5.0–8.0)

## 2018-05-09 LAB — TYPE AND SCREEN
ABO/RH(D): A NEG
Antibody Screen: NEGATIVE

## 2018-05-09 LAB — WET PREP, GENITAL
Sperm: NONE SEEN
Trich, Wet Prep: NONE SEEN
Yeast Wet Prep HPF POC: NONE SEEN

## 2018-05-09 LAB — AMNISURE RUPTURE OF MEMBRANE (ROM) NOT AT ARMC: AMNISURE: NEGATIVE

## 2018-05-09 LAB — GLUCOSE, CAPILLARY: Glucose-Capillary: 85 mg/dL (ref 70–99)

## 2018-05-09 NOTE — Discharge Instructions (Signed)
Cesarean Delivery Cesarean birth, or cesarean delivery, is the surgical delivery of a baby through an incision in the abdomen and the uterus. This may be referred to as a C-section. This procedure may be scheduled ahead of time, or it may be done in an emergency situation. Tell a health care provider about:  Any allergies you have.  All medicines you are taking, including vitamins, herbs, eye drops, creams, and over-the-counter medicines.  Any problems you or family members have had with anesthetic medicines.  Any blood disorders you have.  Any surgeries you have had.  Any medical conditions you have.  Whether you or any members of your family have a history of deep vein thrombosis (DVT) or pulmonary embolism (PE). What are the risks? Generally, this is a safe procedure. However, problems may occur, including:  Infection.  Bleeding.  Allergic reactions to medicines.  Damage to other structures or organs.  Blood clots.  Injury to your baby.  What happens before the procedure?  Follow instructions from your health care provider about eating or drinking restrictions.  Follow instructions from your health care provider about bathing before your procedure to help reduce your risk of infection.  If you know that you are going to have a cesarean delivery, do not shave your pubic area. Shaving before the procedure may increase your risk of infection.  Ask your health care provider about: ? Changing or stopping your regular medicines. This is especially important if you are taking diabetes medicines or blood thinners. ? Your pain management plan. This is especially important if you plan to breastfeed your baby. ? How long you will be in the hospital after the procedure. ? Any concerns you may have about receiving blood products if you need them during the procedure. ? Cord blood banking, if you plan to collect your baby's umbilical cord blood.  You may also want to ask your  health care provider: ? Whether you will be able to hold or breastfeed your baby while you are still in the operating room. ? Whether your baby can stay with you immediately after the procedure and during your recovery. ? Whether a family member or a person of your choice can go with you into the operating room and stay with you during the procedure, immediately after the procedure, and during your recovery.  Plan to have someone drive you home when you are discharged from the hospital. What happens during the procedure?  Fetal monitors will be placed on your abdomen to monitor your heart rate and your baby's heart rate.  Depending on the reason for your cesarean delivery, you may have a physical exam or additional testing, such as an ultrasound.  An IV tube will be inserted into one of your veins.  You may have your blood or urine tested.  You will be given antibiotic medicine to help prevent infection.  You may be given a special warming gown to wear to keep your temperature stable.  Hair may be removed from your pubic area.  The skin of your pubic area and lower abdomen will be cleaned with a germ-killing solution (antiseptic).  A catheter may be inserted into your bladder through your urethra. This drains your urine during the procedure.  You may be given one or more of the following: ? A medicine to numb the area (local anesthetic). ? A medicine to make you fall asleep (general anesthetic). ? A medicine (regional anesthetic) that is injected into your back or through a small   thin tube placed in your back (spinal anesthetic or epidural anesthetic). This numbs everything below the injection site and allows you to stay awake during your procedure. If this makes you feel nauseous, tell your health care provider. Medicines will be available to help reduce any nausea you may feel.  An incision will be made in your abdomen, and then in your uterus.  If you are awake during your  procedure, you may feel tugging and pulling in your abdomen, but you should not feel pain. If you feel pain, tell your health care provider immediately.  Your baby will be removed from your uterus. You may feel more pressure or pushing while this happens.  Immediately after birth, your baby will be dried and kept warm. You may be able to hold and breastfeed your baby. The umbilical cord may be clamped and cut during this time.  Your placenta will be removed from your uterus.  Your incisions will be closed with stitches (sutures). Staples, skin glue, or adhesive strips may also be applied to the incision in your abdomen.  Bandages (dressings) will be placed over the incision in your abdomen. The procedure may vary among health care providers and hospitals. What happens after the procedure?  Your blood pressure, heart rate, breathing rate, and blood oxygen level will be monitored often until the medicines you were given have worn off.  You may continue to receive fluids and medicines through an IV tube.  You will have some pain. Medicines will be available to help control your pain.  To help prevent blood clots: ? You may be given medicines. ? You may have to wear compression stockings or devices. ? You will be encouraged to walk around when you are able.  Hospital staff will encourage and support bonding with your baby. Your hospital may allow you and your baby to stay in the same room (rooming in) during your hospital stay to encourage successful breastfeeding.  You may be encouraged to cough and breathe deeply often. This helps to prevent lung problems.  If you have a catheter draining your urine, it will be removed as soon as possible after your procedure. This information is not intended to replace advice given to you by your health care provider. Make sure you discuss any questions you have with your health care provider. Document Released: 10/07/2005 Document Revised: 03/14/2016  Document Reviewed: 07/18/2015 Elsevier Interactive Patient Education  2018 Elsevier Inc.  

## 2018-05-09 NOTE — MAU Note (Signed)
Laurie [name] is a 22 y.o. at 6951w1d here in MAU reporting:  +contractions Irregular +LOF Clear Not having to wear a pad [facility] but states has not been going Onset of complaint: contractions started at 8am; LOF started at noon Pain score: 5/10 Vitals:   05/09/18 1700  BP: (!) 114/59  Pulse: 74  Temp: 97.8 F (36.6 C)  SpO2: 100%     FHT:152 Lab orders placed from triage: ua

## 2018-05-09 NOTE — MAU Provider Note (Signed)
History   G4 P1-1-1-1 at 35 weeks and 1 day no prenatal care with the exception of one visit [facility] at 22 weeks and with abdominal pain that started this morning.  She denies vaginal bleeding or rupture membranes.  Patient is a previous C-section x2.  She has a previous history of drug use in her prior pregnancies.  When questioning patient as to why she is not getting prenatal care she politely informed me that she did get some prenatal care at 22 weeks.  Also informed her that she should have had RhoGam at 28 weeks and she informed me that she was well aware that.  CSN: [id number]  Arrival date & time 05/09/18  1644   None     Chief Complaint  Patient presents with  . Contractions  . Rupture of Membranes    HPI  Past Medical History:  Diagnosis Date  . Anxiety   . Asthma    "grew out"  . Chlamydia   . Depression    doing really good  . Gestational diabetes   . Hepatitis C June 2016  . History of heroin abuse   . Infection    UTI    Past Surgical History:  Procedure Laterality Date  . CESAREAN SECTION N/A 01/11/2016   Procedure: CESAREAN SECTION;  Surgeon: Brock Badharles A Harper, MD;  Location: [facility];  Service: Obstetrics;  Laterality: N/A;  . CESAREAN SECTION N/A 12/21/2016   Procedure: REPEAT CESAREAN SECTION;  Surgeon: Adam PhenixJames G Arnold, MD;  [facility];  Service: Obstetrics;  Laterality: N/A;    Family History  Problem Relation Age of Onset  . Diabetes Mother   . Pulmonary embolism Mother   . Stroke Mother   . Cancer Paternal Grandmother        liver  . Epilepsy Brother     Social History   Tobacco Use  . Smoking status: Current Some Day Smoker    Packs/day: 0.25    Types: Cigarettes  . Smokeless tobacco: Never Used  . Tobacco comment: 4 cigs per day  Substance Use Topics  . Alcohol use: No    Comment: Occas use.; none since mid Sept  . Drug use: Not Currently    Types: Marijuana, Heroin    Comment: last use 2 weeks, heroin 3 years  ago    OB History    Gravida  4   Para  2   Term  1   Preterm  1   AB  1   Living  1     SAB  1   TAB  0   Ectopic  0   Multiple  0   Live Births  1           Review of Systems  Constitutional: Negative.   HENT: Negative.   Eyes: Negative.   Respiratory: Negative.   Cardiovascular: Negative.   Gastrointestinal: Positive for abdominal pain.  Endocrine: Negative.   Genitourinary: Negative.   Musculoskeletal: Negative.   Skin: Negative.   Allergic/Immunologic: Negative.   Neurological: Negative.   Hematological: Negative.   Psychiatric/Behavioral: Negative.     Allergies  Penicillins  Home Medications    BP (!) 114/59 (BP Location: Right Arm)   Pulse 74   Temp 97.8 F (36.6 C) (Oral)   Wt 145 lb (65.8 kg)   LMP 08/22/2017 (Approximate)   SpO2 100% Comment: ra  BMI 25.69 kg/m   Physical Exam  Constitutional: She is oriented to  person, place, and time. She appears well-developed and well-nourished.  Neck: Normal range of motion.  Cardiovascular: Normal rate, regular rhythm, normal heart sounds and intact distal pulses.  Pulmonary/Chest: Effort normal and breath sounds normal.  Abdominal: Soft. Bowel sounds are normal.  Genitourinary: Vagina normal and uterus normal.  Musculoskeletal: Normal range of motion.  Neurological: She is alert and oriented to person, place, and time.  Skin: Skin is warm and dry.  Psychiatric: She has a normal mood and affect. Her behavior is normal. Thought content normal.    MAU Course  Procedures (including critical care time)  Labs Reviewed  WET PREP, GENITAL  RAPID URINE DRUG SCREEN, HOSP PERFORMED  URINALYSIS, ROUTINE W REFLEX MICROSCOPIC  AMNISURE RUPTURE OF MEMBRANE (ROM) NOT AT [facility]  HEPATITIS B SURFACE ANTIGEN  RUBELLA SCREEN  RPR  CBC  DIFFERENTIAL  HIV ANTIBODY (ROUTINE TESTING)  TYPE AND SCREEN  GC/CHLAMYDIA PROBE AMP ([place]) NOT AT [facility]   No results found.   No diagnosis  found.    MDM  Vital signs have remained stable fetal heart rate pattern reassuring and reactive with A cells noted no decelerations.  Mild UC's have abated.  Will discharge patient home to contact [facility] to schedule repeat cesarean section.

## 2018-05-10 LAB — RUBELLA SCREEN: Rubella: <0.9 {index} — ABNORMAL LOW

## 2018-05-10 LAB — HIV ANTIBODY (ROUTINE TESTING W REFLEX): HIV SCREEN 4TH GENERATION: NONREACTIVE

## 2018-05-10 LAB — HEPATITIS B SURFACE ANTIGEN: Hepatitis B Surface Ag: NEGATIVE

## 2018-05-10 LAB — SYPHILIS: RPR W/REFLEX TO RPR TITER AND TREPONEMAL ANTIBODIES, TRADITIONAL SCREENING AND DIAGNOSIS ALGORITHM: RPR Ser Ql: NONREACTIVE

## 2018-05-11 ENCOUNTER — Telehealth: Payer: Self-pay | Admitting: *Deleted

## 2018-05-11 LAB — GC/CHLAMYDIA PROBE AMP (~~LOC~~) NOT AT ARMC
CHLAMYDIA, DNA PROBE: NEGATIVE
NEISSERIA GONORRHEA: NEGATIVE

## 2018-05-11 NOTE — Telephone Encounter (Signed)
Lmom for pt to contact our office to set up f/u OB appt.  05-11-18  AS

## 2018-05-15 ENCOUNTER — Ambulatory Visit (INDEPENDENT_AMBULATORY_CARE_PROVIDER_SITE_OTHER): Payer: 59 | Admitting: Obstetrics and Gynecology

## 2018-05-15 ENCOUNTER — Encounter: Payer: Self-pay | Admitting: Obstetrics and Gynecology

## 2018-05-15 VITALS — BP 95/59 | HR 87 | Wt 146.0 lb

## 2018-05-15 DIAGNOSIS — Z3403 Encounter for supervision of normal first pregnancy, third trimester: Secondary | ICD-10-CM | POA: Diagnosis not present

## 2018-05-15 DIAGNOSIS — Z3A36 36 weeks gestation of pregnancy: Secondary | ICD-10-CM

## 2018-05-15 DIAGNOSIS — F1910 Other psychoactive substance abuse, uncomplicated: Secondary | ICD-10-CM | POA: Diagnosis not present

## 2018-05-15 NOTE — Progress Notes (Signed)
Patient ID: Ancil BoozerSkylar E Kirchgessner, female   DOB: 08-Oct-1996, 22 y.o.   MRN: [id number]   LOW-RISK PREGNANCY VISIT Patient name: Thornton DalesSkylar E Kaufmann MRN [id number]  Date of birth: 08-Oct-1996 Chief Complaint:   low risk ob  History of Present Illness:   Thornton DalesSkylar E Silguero is a 22 y.o. [phone] female at 2170w0d with an Estimated Date of Delivery: 06/12/18 being seen today for ongoing management of a low-risk pregnancy.  This been notable for positive urine drug screen for marijuana several times, amphetamines x1.  Today the patient states that she is used marijuana recently, and acknowledges cocaine use about a month ago denies any recent use Today she reports no complaints. She endorses recently smoking marijuana and prior to that she did cocaine a few weeks ago. Her previous pregnancy ended in abruption. She has history of drug abuse. She was seen in the ED on 05/09/2018 for drug complications and needs to be scheduled for a c-section at 39 weeks. She has ambivalent about which form of birth control. She is not sure exactly what she wants to do yet. She is planning on breast feeding and would like a birth control that she can use while breast feeding. Contractions: Irregular.  .  Movement: Present. denies leaking of fluid. Review of Systems:   Pertinent items are noted in HPI Denies abnormal vaginal discharge w/ itching/odor/irritation, headaches, visual changes, shortness of breath, chest pain, abdominal pain, severe nausea/vomiting, or problems with urination or bowel movements unless otherwise stated above. Pertinent History Reviewed:  Reviewed past medical,surgical, social, obstetrical and family history.  Reviewed problem list, medications and allergies. Physical Assessment:   Vitals:   05/15/18 1301  BP: (!) 95/59  Pulse: 87  Weight: 146 lb (66.2 kg)  Body mass index is 25.86 kg/m.        Physical Examination:   General appearance: Well appearing, and in no distress  Mental status: Alert, oriented to person,  place, and time  Skin: Warm & dry  Cardiovascular: Normal heart rate noted  Respiratory: Normal respiratory effort, no distress  Abdomen: Soft, gravid, nontender  Pelvic: Cervical exam deferred         Extremities: Edema: None  Fetal Status: Fetal Heart Rate (bpm): 131 Fundal Height: 35 cm Movement: Present    No results found for this or any previous visit (from the past 24 hour(s)).  Assessment & Plan:  1) Low-risk pregnancy [id number] at 9070w0d with an Estimated Date of Delivery: 06/12/18   2) Repeat C-section, at 39 weeks on or about 16 August   Meds: No orders of the defined types were placed in this encounter.  Plan:  Continue routine obstetrical care, Schedule repeat c-section at 39 weeks  Follow-up: Return in about 1 week (around 05/22/2018) for LROB.  No orders of the defined types were placed in this encounter.  By signing my name below, I, Diona BrownerJennifer Gorman, attest that this documentation has been prepared under the direction and in the presence of Tilda BurrowFerguson, Cambelle Suchecki V, MD. Electronically Signed: Diona BrownerJennifer Gorman, Medical Scribe. 05/15/18. 1:23 PM.  I personally performed the services described in this documentation, which was SCRIBED in my presence. The recorded information has been reviewed and considered accurate. It has been edited as necessary during review. Tilda BurrowJohn V Dajour [place], MD

## 2018-05-22 ENCOUNTER — Encounter: Payer: 59 | Admitting: Obstetrics and Gynecology

## 2018-05-25 ENCOUNTER — Encounter: Payer: 59 | Admitting: Obstetrics and Gynecology

## 2018-05-28 ENCOUNTER — Encounter: Payer: Self-pay | Admitting: *Deleted

## 2018-05-28 ENCOUNTER — Observation Stay
Admission: EM | Admit: 2018-05-28 | Discharge: 2018-05-28 | Disposition: A | Discharge status: Home / Self Care | Payer: 59 | Attending: Certified Nurse Midwife | Admitting: Certified Nurse Midwife

## 2018-05-28 DIAGNOSIS — F1421 Cocaine dependence, in remission: Secondary | ICD-10-CM | POA: Diagnosis not present

## 2018-05-28 DIAGNOSIS — O26893 Other specified pregnancy related conditions, third trimester: Principal | ICD-10-CM | POA: Insufficient documentation

## 2018-05-28 DIAGNOSIS — Z836 Family history of other diseases of the respiratory system: Secondary | ICD-10-CM | POA: Diagnosis not present

## 2018-05-28 DIAGNOSIS — Z823 Family history of stroke: Secondary | ICD-10-CM | POA: Diagnosis not present

## 2018-05-28 DIAGNOSIS — Z8632 Personal history of gestational diabetes: Secondary | ICD-10-CM | POA: Insufficient documentation

## 2018-05-28 DIAGNOSIS — F419 Anxiety disorder, unspecified: Secondary | ICD-10-CM | POA: Insufficient documentation

## 2018-05-28 DIAGNOSIS — F17210 Nicotine dependence, cigarettes, uncomplicated: Secondary | ICD-10-CM | POA: Diagnosis not present

## 2018-05-28 DIAGNOSIS — F329 Major depressive disorder, single episode, unspecified: Secondary | ICD-10-CM | POA: Insufficient documentation

## 2018-05-28 DIAGNOSIS — Z820 Family history of epilepsy and other diseases of the nervous system: Secondary | ICD-10-CM | POA: Insufficient documentation

## 2018-05-28 DIAGNOSIS — F1111 Opioid abuse, in remission: Secondary | ICD-10-CM | POA: Diagnosis not present

## 2018-05-28 DIAGNOSIS — F1511 Other stimulant abuse, in remission: Secondary | ICD-10-CM | POA: Insufficient documentation

## 2018-05-28 DIAGNOSIS — O2693 Pregnancy related conditions, unspecified, third trimester: Secondary | ICD-10-CM | POA: Diagnosis present

## 2018-05-28 DIAGNOSIS — Z800 Family history of malignant neoplasm of digestive organs: Secondary | ICD-10-CM | POA: Diagnosis not present

## 2018-05-28 DIAGNOSIS — Z8619 Personal history of other infectious and parasitic diseases: Secondary | ICD-10-CM | POA: Insufficient documentation

## 2018-05-28 DIAGNOSIS — Z833 Family history of diabetes mellitus: Secondary | ICD-10-CM | POA: Diagnosis not present

## 2018-05-28 DIAGNOSIS — Z3A37 37 weeks gestation of pregnancy: Secondary | ICD-10-CM | POA: Insufficient documentation

## 2018-05-28 DIAGNOSIS — F1910 Other psychoactive substance abuse, uncomplicated: Secondary | ICD-10-CM

## 2018-05-28 HISTORY — DX: Type A blood, Rh negative: Z67.11

## 2018-05-28 HISTORY — DX: Other psychoactive substance abuse, uncomplicated: F19.10

## 2018-05-28 LAB — URINALYSIS, COMPLETE (UACMP) WITH MICROSCOPIC
BACTERIA UA: NONE SEEN
Glucose, UA: NEGATIVE mg/dL
HGB URINE DIPSTICK: NEGATIVE
KETONES UR: 80 mg/dL — AB
Nitrite: NEGATIVE
PROTEIN: 100 mg/dL — AB
Specific Gravity, Urine: 1.033 — ABNORMAL HIGH (ref 1.005–1.030)
pH: 5 (ref 5.0–8.0)

## 2018-05-28 LAB — URINE DRUG SCREEN, QUALITATIVE (ARMC ONLY)
AMPHETAMINES, UR SCREEN: POSITIVE — AB
Barbiturates, Ur Screen: NOT DETECTED
CANNABINOID 50 NG, UR ~~LOC~~: POSITIVE — AB
Cocaine Metabolite,Ur ~~LOC~~: POSITIVE — AB
MDMA (ECSTASY) UR SCREEN: NOT DETECTED
Methadone Scn, Ur: NOT DETECTED
Opiate, Ur Screen: POSITIVE — AB
Phencyclidine (PCP) Ur S: NOT DETECTED
TRICYCLIC, UR SCREEN: NOT DETECTED

## 2018-05-28 MED ORDER — RHO D IMMUNE GLOBULIN 1500 UNIT/2ML IJ SOSY
300.0000 ug | PREFILLED_SYRINGE | Freq: Once | INTRAMUSCULAR | Status: DC
  Filled 2018-05-28: qty 2

## 2018-05-28 NOTE — OB Triage Note (Signed)
Pt arrived via EMS with complaints of leaking fluid since 4 pm today. Pt reports having constant back pain all day. Pt denies any vaginal bleeding and reports positive fetal movement. Pt reports [facility] but has only been to a few appointments. Pt reports having a C/S x 2 with her previous pregnancies.

## 2018-05-28 NOTE — Progress Notes (Signed)
Pt requesting to leave and not receive RhoGam. Pt reports "I can receive that at my drs office."

## 2018-05-28 NOTE — Progress Notes (Signed)
CNM notified of pt arrival to hospital via EMS and pt complaint. CNM aware of FHR tracing and TOCO reading. Orders received per CNM

## 2018-05-28 NOTE — Progress Notes (Signed)
Pt signed AMA form. Pt wanting to go home without receiving RhoGam. Maebelle Munroe Gutierrez, CNM at bedside discussing risks of not receiving RhoGam and instructions to make a follow up appointment as soon as possible to receive RhoGam.

## 2018-05-28 NOTE — Final Progress Note (Signed)
Physician Final Progress Note  Patient ID: Laurie [name] MRN: [id number] DOB/AGE: 1996/09/02 22 y.o.  Admit date: 05/28/2018 Admitting provider: Vena AustriaAndreas Staebler, MD/ Gasper Lloydolleen L. Sharen HonesGutierrez, CNM Discharge date: 05/28/2018   Admission Diagnoses: Leaking fluid ay [id number] Prior Cesarean section x 2  Discharge Diagnoses:  IUP at 37wk 6day No evidence of spontaneous rupture of membrane Rh neg Inadequate prenatal care Prior CS x 2 Polysubstance abuse  Consults: None  Significant Findings/ Diagnostic Studies: [age] year old G4 P1112 with EDC= 06/12/18 by a 6wk 5 day ultrasound presents at 37 wk 6 days via EMS with complaints of leakage of fluid, possible PROM. Complained of constant back pain, but no regular contractions or vaginal bleeding. Baby moving well.  Her 583 year old was brought to L&D by the police. Past history remarkable for prior Cesarean section x 2 (the first for arrest of dilation and the second for PPROM at 29 weeks, abruption at 33 weeks and breech presentation), GDM with two prior pregnancies, hepatitis C, and polysubstance abuse.   She has had little prenatal care with this pregnancy and did not receive her Rhogam this pregnancy. Blood type A negative.  Past Medical History:  Diagnosis Date  . Anxiety   . Asthma    "grew out"  . Chlamydia   . Depression    doing really good  . Gestational diabetes    with first two pregnancies  . Hepatitis C June 2016  . History of heroin abuse   . Infection    UTI  . Polysubstance abuse (HCC) 05/28/2018   UDS positive for cocaine, MJ, meth, opioids  . Type A blood, Rh negative    Past Surgical History:  Procedure Laterality Date  . CESAREAN SECTION N/A 01/11/2016   Procedure: CESAREAN SECTION;  Surgeon: Brock Badharles A Harper, MD;  Location: [facility];  Service: Obstetrics;  Laterality: N/A;  . CESAREAN SECTION N/A 12/21/2016   Procedure: REPEAT CESAREAN SECTION;  Surgeon: Adam PhenixJames G Arnold, MD;  [facility];  Service:  Obstetrics;  Laterality: N/A;   Family History  Problem Relation Age of Onset  . Diabetes Mother   . Pulmonary embolism Mother   . Stroke Mother   . Cancer Paternal Grandmother        liver  . Epilepsy Brother    Social History   Socioeconomic History  . Marital status: Single    Spouse name: Not on file  . Number of children: 2  . Years of education: Not on file  . Highest education level: Not on file  Occupational History  . Not on file  Social Needs  . Financial resource strain: Not on file  . Food insecurity:    Worry: Not on file    Inability: Not on file  . Transportation needs:    Medical: Yes    Non-medical: Not on file  Tobacco Use  . Smoking status: Current Some Day Smoker    Packs/day: 0.25    Types: Cigarettes  . Smokeless tobacco: Never Used  . Tobacco comment: 4 cigs per day  Substance and Sexual Activity  . Alcohol use: No    Comment: Occas use.; none since mid Sept  . Drug use: Yes    Types: Marijuana, Heroin, Methamphetamines, Cocaine, Other-see comments    Comment: 05/28/18 +UDS for meth, cocaine, opioids, MJ;  heroin 3 years ago  . Sexual activity: Yes    Birth control/protection: None  Lifestyle  . Physical activity:    Days per week:  Not on file    Minutes per session: Not on file  . Stress: Not on file  Relationships  . Social connections:    Talks on phone: Not on file    Gets together: Not on file    Attends religious service: Not on file    Active member of club or organization: Not on file    Attends meetings of clubs or organizations: Not on file    Relationship status: Not on file  . Intimate partner violence:    Fear of current or ex partner: Not on file    Emotionally abused: Not on file    Physically abused: Not on file    Forced sexual activity: Not on file  Other Topics Concern  . Not on file  Social History Narrative  . Not on file   Exam: BP 123/63   Pulse 88   Resp 18   LMP 08/22/2017 (Approximate)   General:  awake, alert, stripped naked. Appears to be in NAD Heart: regular rate Chest/Respiratory: normal respiratory effort Abdomen: Cephalic on Leopold's, NT FHR: 110s baseline with accelerations to 140s to 150s, moderate variability Toco: irregular, mild contractions Pelvic exam: Vulva: no lesions  Vagina: mucoid discharge  Cervix: 1/50%/-1 Nitrazine: negative x 3 Extremities: no edema Psyche: mood appropriate, poor judgement Neuro: alert, awake, oriented  Results for orders placed or performed during the hospital encounter of 05/28/18 (from the past 24 hour(s))  Urinalysis, Complete w Microscopic     Status: Abnormal   Collection Time: 05/28/18  1:30 AM  Result Value Ref Range   Color, Urine AMBER (A) YELLOW   APPearance CLOUDY (A) CLEAR   Specific Gravity, Urine 1.033 (H) 1.005 - 1.030   pH 5.0 5.0 - 8.0   Glucose, UA NEGATIVE NEGATIVE mg/dL   Hgb urine dipstick NEGATIVE NEGATIVE   Bilirubin Urine SMALL (A) NEGATIVE   Ketones, ur 80 (A) NEGATIVE mg/dL   Protein, ur 130 (A) NEGATIVE mg/dL   Nitrite NEGATIVE NEGATIVE   Leukocytes, UA MODERATE (A) NEGATIVE   RBC / HPF 0-5 0 - 5 RBC/hpf   WBC, UA 11-20 0 - 5 WBC/hpf   Bacteria, UA NONE SEEN NONE SEEN   Squamous Epithelial / LPF 21-50 0 - 5   Mucus PRESENT    Non Squamous Epithelial PRESENT (A) NONE SEEN  Urine Drug Screen, Qualitative ([facility] only)     Status: Abnormal   Collection Time: 05/28/18  1:30 AM  Result Value Ref Range   Tricyclic, Ur Screen NONE DETECTED NONE DETECTED   Amphetamines, Ur Screen POSITIVE (A) NONE DETECTED   MDMA (Ecstasy)Ur Screen NONE DETECTED NONE DETECTED   Cocaine Metabolite,Ur [place] POSITIVE (A) NONE DETECTED   Opiate, Ur Screen POSITIVE (A) NONE DETECTED   Phencyclidine (PCP) Ur S NONE DETECTED NONE DETECTED   Cannabinoid 50 Ng, Ur  POSITIVE (A) NONE DETECTED   Barbiturates, Ur Screen NONE DETECTED NONE DETECTED   Benzodiazepine, Ur Scrn TEST NOT PERFORMED, REAGENT NOT AVAILABLE (A) NONE DETECTED    Methadone Scn, Ur NONE DETECTED NONE DETECTED  Type and screen     Status: None (Preliminary result)   Collection Time: 05/28/18  4:16 AM  Result Value Ref Range   ABO/RH(D) PENDING    Antibody Screen PENDING    Sample Expiration      05/31/2018 Performed at [facility], [address] [place], [place] [address]    A: IUP at 37wk 6 days with no evidence of PROM Not in labor  Prior CS x 2 Polysubstance abuse + ketones and concentrated urine, not a good clean catch urine with 21-50 squamous cells Rh negative-did not get Rhogam during pregnancy Insufficient prenatal care Reactive non stress test  P:Patient did eat and drink Discussed positive UDS for opiates, meth, MJ, and cocaine. Discussed risks to pregnancy of abruption with cocaine use. Recommended no further use of these drugs. DIscussed monitoring fetal movement. Recommended staying for Rhogam, but patient declined and signed out AMA. Highly recommended making appointment at [facility] today to have repeat CS scheduled and to get Rhogam. GBS done      Procedures: none  Discharge Condition: stable  Disposition: signed out AMA  Diet: Regular diet  Discharge Activity: Activity as tolerated   Allergies as of 05/28/2018      Reactions   Penicillins Other (See Comments)   Results thru an allergy skin test Has patient had a PCN reaction causing immediate rash, facial/tongue/throat swelling, SOB or lightheadedness with hypotension: No Has patient had a PCN reaction causing severe rash involving mucus membranes or skin necrosis: No Has patient had a PCN reaction that required hospitalization No Has patient had a PCN reaction occurring within the last 10 years: No If all of the above answers are "NO", then may proceed with Cephalosporin use.      Medication List    STOP taking these medications   cephALEXin 500 MG capsule Commonly known as:  KEFLEX     TAKE these medications   PRENATAL COMPLETE 14-0.4 MG  Tabs Take 1 tablet by mouth daily.        Total time spent taking care of this patient: 20 minutes  Signed: Farrel Conners 05/28/2018, 5:14 AM

## 2018-05-29 ENCOUNTER — Telehealth: Payer: Self-pay | Admitting: *Deleted

## 2018-05-29 ENCOUNTER — Ambulatory Visit (INDEPENDENT_AMBULATORY_CARE_PROVIDER_SITE_OTHER): Payer: 59 | Admitting: *Deleted

## 2018-05-29 ENCOUNTER — Other Ambulatory Visit: Payer: Self-pay

## 2018-05-29 VITALS — BP 117/73 | HR 81

## 2018-05-29 DIAGNOSIS — O360130 Maternal care for anti-D [Rh] antibodies, third trimester, not applicable or unspecified: Secondary | ICD-10-CM | POA: Diagnosis not present

## 2018-05-29 DIAGNOSIS — Z6791 Unspecified blood type, Rh negative: Secondary | ICD-10-CM

## 2018-05-29 DIAGNOSIS — Z3483 Encounter for supervision of other normal pregnancy, third trimester: Secondary | ICD-10-CM

## 2018-05-29 DIAGNOSIS — O26893 Other specified pregnancy related conditions, third trimester: Secondary | ICD-10-CM

## 2018-05-29 DIAGNOSIS — Z3A38 38 weeks gestation of pregnancy: Secondary | ICD-10-CM

## 2018-05-29 DIAGNOSIS — Z331 Pregnant state, incidental: Secondary | ICD-10-CM

## 2018-05-29 DIAGNOSIS — Z1389 Encounter for screening for other disorder: Secondary | ICD-10-CM

## 2018-05-29 LAB — POCT URINALYSIS DIPSTICK OB
Blood, UA: NEGATIVE
GLUCOSE, UA: NEGATIVE — AB
KETONES UA: NEGATIVE
Leukocytes, UA: NEGATIVE
Nitrite, UA: NEGATIVE
PROTEIN: NEGATIVE

## 2018-05-29 NOTE — Progress Notes (Signed)
Pt given rhogam right VG without complications. Advised to keep her regular OB appt for next week. Pt verbalized understanding.

## 2018-05-29 NOTE — Telephone Encounter (Signed)
Pt called stating that she was seen at [facility] ED last night. She was told she needed Rhogam but didn't want to wait on it. Advised pt she could come by for her rhogam and would need an appt with a provider next week. Pt verbalized understanding.

## 2018-06-01 LAB — CULTURE, BETA STREP (GROUP B ONLY)

## 2018-06-02 ENCOUNTER — Other Ambulatory Visit: Payer: Self-pay | Admitting: Obstetrics and Gynecology

## 2018-06-02 ENCOUNTER — Telehealth: Payer: Self-pay | Admitting: Obstetrics and Gynecology

## 2018-06-02 ENCOUNTER — Encounter: Payer: 59 | Admitting: Obstetrics and Gynecology

## 2018-06-02 DIAGNOSIS — O99323 Drug use complicating pregnancy, third trimester: Secondary | ICD-10-CM

## 2018-06-03 ENCOUNTER — Ambulatory Visit (INDEPENDENT_AMBULATORY_CARE_PROVIDER_SITE_OTHER): Payer: 59 | Admitting: Obstetrics and Gynecology

## 2018-06-03 ENCOUNTER — Telehealth (HOSPITAL_COMMUNITY): Payer: Self-pay | Admitting: *Deleted

## 2018-06-03 ENCOUNTER — Encounter: Payer: Self-pay | Admitting: Obstetrics and Gynecology

## 2018-06-03 VITALS — BP 114/71 | HR 72 | Wt 153.0 lb

## 2018-06-03 DIAGNOSIS — Z1389 Encounter for screening for other disorder: Secondary | ICD-10-CM | POA: Diagnosis not present

## 2018-06-03 DIAGNOSIS — Z3A38 38 weeks gestation of pregnancy: Secondary | ICD-10-CM

## 2018-06-03 DIAGNOSIS — Z331 Pregnant state, incidental: Secondary | ICD-10-CM | POA: Diagnosis not present

## 2018-06-03 DIAGNOSIS — O34219 Maternal care for unspecified type scar from previous cesarean delivery: Secondary | ICD-10-CM | POA: Diagnosis not present

## 2018-06-03 DIAGNOSIS — Z3483 Encounter for supervision of other normal pregnancy, third trimester: Secondary | ICD-10-CM

## 2018-06-03 LAB — POCT URINALYSIS DIPSTICK OB
Blood, UA: NEGATIVE
Glucose, UA: NEGATIVE — AB
KETONES UA: NEGATIVE
NITRITE UA: NEGATIVE
PROTEIN: NEGATIVE

## 2018-06-03 NOTE — Telephone Encounter (Signed)
Pt called to insure she is willing to come for appt to be seen and to sign tubal papers if desired.

## 2018-06-03 NOTE — Telephone Encounter (Signed)
Preadmission screen  

## 2018-06-03 NOTE — Progress Notes (Signed)
Patient ID: Ancil BoozerSkylar E Paccione, female   DOB: [birth year], 22 y.o.   MRN: [id number]    LOW-RISK PREGNANCY VISIT Patient name: Laurie [name] MRN [id number]  Date of birth: [birth year] Chief Complaint:   Routine Prenatal Visit (gbs/gc today)  History of Present Illness:   Laurie [name] is a 22 y.o. [phone] female at 3845w5d with an Estimated Date of Delivery: 06/12/18 being seen today for ongoing management of a low-risk pregnancy. Patient has a history of cocaine and marijuana drug usage. This is her 3rd pregnancy with repeat c-section. Has never delivered vaginally and only has custody of her son. With her first child she was "stuck at 7 cm" and was placed foley to help dilate but did not progress. With second child at 33 weeks the placenta burst, no cocaine was involved. [facility]. Patient is Hep C positive. She hasn't looked at Hep C treatments yet and is trying to see her mothers doctor. She is still onboard with tubal but is now considering IUD insert Today she reports no complaints. Contractions: Irregular. Vag. Bleeding: None.  Movement: Present. denies leaking of fluid. Review of Systems:   Pertinent items are noted in HPI Denies abnormal vaginal discharge w/ itching/odor/irritation, headaches, visual changes, shortness of breath, chest pain, abdominal pain, severe nausea/vomiting, or problems with urination or bowel movements unless otherwise stated above. Pertinent History Reviewed:  Reviewed past medical,surgical, social, obstetrical and family history.  Reviewed problem list, medications and allergies. Physical Assessment:   Vitals:   06/03/18 1353  BP: 114/71  Pulse: 72  Weight: 153 lb (69.4 kg)  Body mass index is 27.1 kg/m.        Physical Examination:   General appearance: Well appearing, and in no distress  Mental status: Alert, oriented to person, place, and time  Skin: Warm & dry  Cardiovascular: Normal heart rate  noted  Respiratory: Normal respiratory effort, no distress  Abdomen: Soft, gravid, nontender  Pelvic: Cervical exam performed , 1 cm, closed (internal os), long and high.        Extremities: Edema: Trace  Fetal Status:     Movement: Present    Results for orders placed or performed in visit on 06/03/18 (from the past 24 hour(s))  POC Urinalysis Dipstick OB   Collection Time: 06/03/18  1:55 PM  Result Value Ref Range   Color, UA     Clarity, UA     Glucose, UA Negative (A) (none)   Bilirubin, UA     Ketones, UA neg    Spec Grav, UA     Blood, UA neg    pH, UA     POC Protein UA Negative Negative, Trace   Urobilinogen, UA     Nitrite, UA neg    Leukocytes, UA Moderate (2+) (A) Negative   Appearance     Odor      Assessment & Plan:  1) Low-risk pregnancy [id number] at 5745w5d with an Estimated Date of Delivery: 06/12/18   2) Repeat C-section x3    Meds: No orders of the defined types were placed in this encounter.  Labs/procedures today: Group B Strep and NAA+Rflx  Plan:  Scheduled for C-section ay 39 weeks   Follow-up: No follow-ups on file.  Orders Placed This Encounter  Procedures  . GC/Chlamydia Probe Amp  . Strep Gp B NAA+Rflx  . POC Urinalysis Dipstick OB   By signing my name below, I, Arnette NorrisMari Johnson, attest that  this documentation has been prepared under the direction and in the presence of Tilda BurrowFerguson, Rosaly Labarbera V, MD. Electronically Signed: Arnette NorrisMari Johnson Medical Scribe. 06/03/18. 2:34 PM.  I personally performed the services described in this documentation, which was SCRIBED in my presence. The recorded information has been reviewed and considered accurate. It has been edited as necessary during review. Tilda BurrowJohn V Ranelle Auker, MD

## 2018-06-04 ENCOUNTER — Telehealth (HOSPITAL_COMMUNITY): Payer: Self-pay | Admitting: *Deleted

## 2018-06-04 ENCOUNTER — Encounter (HOSPITAL_COMMUNITY): Payer: Self-pay

## 2018-06-04 ENCOUNTER — Encounter (HOSPITAL_COMMUNITY)
Admission: RE | Admit: 2018-06-04 | Discharge: 2018-06-04 | Disposition: A | Discharge status: Home / Self Care | Payer: 59 | Source: Ambulatory Visit

## 2018-06-04 LAB — GC/CHLAMYDIA PROBE AMP

## 2018-06-04 NOTE — Telephone Encounter (Signed)
Preadmission screen  

## 2018-06-04 NOTE — Patient Instructions (Addendum)
Laurie [name]  06/04/2018   Your procedure is scheduled on:  06/05/2018  Enter through the Main Entrance of [facility] at 3:00 PM.  Pick up the phone at the desk and dial [phone]  Call this number if you have problems the morning of surgery:[phone]  Remember:   Do not eat food:(After Midnight) Desps de medianoche.  Do not drink clear liquids: (6 Hours before arrival) 6 horas ante llegada. 7 AM  Take these medicines the morning of surgery with A SIP OF WATER: none   Do not wear jewelry, make-up or nail polish.  Do not wear lotions, powders, or perfumes. Do not wear deodorant.  Do not shave 48 hours prior to surgery.  Do not bring valuables to the hospital.  [facility] is not   responsible for any belongings or valuables brought to the hospital.  Contacts, dentures or bridgework may not be worn into surgery.  Leave suitcase in the car. After surgery it may be brought to your room.  For patients admitted to the hospital, checkout time is 11:00 AM the day of              discharge.    N/A   Please read over the following fact sheets that you were given:   Surgical Site Infection Prevention

## 2018-06-05 ENCOUNTER — Encounter (HOSPITAL_COMMUNITY): Payer: Self-pay

## 2018-06-05 ENCOUNTER — Inpatient Hospital Stay (HOSPITAL_COMMUNITY): Payer: 59 | Admitting: Anesthesiology

## 2018-06-05 ENCOUNTER — Other Ambulatory Visit: Payer: Self-pay

## 2018-06-05 ENCOUNTER — Encounter (HOSPITAL_COMMUNITY)
Admission: RE | Disposition: A | Discharge status: Home / Self Care | Payer: Self-pay | Source: Home / Self Care | Attending: Obstetrics and Gynecology

## 2018-06-05 ENCOUNTER — Inpatient Hospital Stay (HOSPITAL_COMMUNITY)
Admission: RE | Admit: 2018-06-05 | Discharge: 2018-06-08 | DRG: 787 | Disposition: A | Discharge status: Home / Self Care | Payer: 59 | Attending: Obstetrics and Gynecology | Admitting: Obstetrics and Gynecology

## 2018-06-05 DIAGNOSIS — D6959 Other secondary thrombocytopenia: Secondary | ICD-10-CM | POA: Diagnosis present

## 2018-06-05 DIAGNOSIS — Z880 Allergy status to penicillin: Secondary | ICD-10-CM

## 2018-06-05 DIAGNOSIS — F1910 Other psychoactive substance abuse, uncomplicated: Secondary | ICD-10-CM | POA: Diagnosis present

## 2018-06-05 DIAGNOSIS — D649 Anemia, unspecified: Secondary | ICD-10-CM | POA: Diagnosis present

## 2018-06-05 DIAGNOSIS — O99334 Smoking (tobacco) complicating childbirth: Secondary | ICD-10-CM | POA: Diagnosis present

## 2018-06-05 DIAGNOSIS — O9842 Viral hepatitis complicating childbirth: Secondary | ICD-10-CM | POA: Diagnosis present

## 2018-06-05 DIAGNOSIS — O99824 Streptococcus B carrier state complicating childbirth: Secondary | ICD-10-CM | POA: Diagnosis present

## 2018-06-05 DIAGNOSIS — Z3A39 39 weeks gestation of pregnancy: Secondary | ICD-10-CM

## 2018-06-05 DIAGNOSIS — O26893 Other specified pregnancy related conditions, third trimester: Secondary | ICD-10-CM | POA: Diagnosis present

## 2018-06-05 DIAGNOSIS — O26899 Other specified pregnancy related conditions, unspecified trimester: Secondary | ICD-10-CM

## 2018-06-05 DIAGNOSIS — O9902 Anemia complicating childbirth: Secondary | ICD-10-CM | POA: Diagnosis present

## 2018-06-05 DIAGNOSIS — O9912 Other diseases of the blood and blood-forming organs and certain disorders involving the immune mechanism complicating childbirth: Secondary | ICD-10-CM | POA: Diagnosis present

## 2018-06-05 DIAGNOSIS — O99119 Other diseases of the blood and blood-forming organs and certain disorders involving the immune mechanism complicating pregnancy, unspecified trimester: Secondary | ICD-10-CM

## 2018-06-05 DIAGNOSIS — B1920 Unspecified viral hepatitis C without hepatic coma: Secondary | ICD-10-CM | POA: Diagnosis not present

## 2018-06-05 DIAGNOSIS — Z98891 History of uterine scar from previous surgery: Secondary | ICD-10-CM

## 2018-06-05 DIAGNOSIS — O34211 Maternal care for low transverse scar from previous cesarean delivery: Secondary | ICD-10-CM | POA: Diagnosis not present

## 2018-06-05 DIAGNOSIS — O99324 Drug use complicating childbirth: Secondary | ICD-10-CM | POA: Diagnosis present

## 2018-06-05 DIAGNOSIS — D696 Thrombocytopenia, unspecified: Secondary | ICD-10-CM | POA: Diagnosis present

## 2018-06-05 DIAGNOSIS — Z6791 Unspecified blood type, Rh negative: Secondary | ICD-10-CM

## 2018-06-05 DIAGNOSIS — F17210 Nicotine dependence, cigarettes, uncomplicated: Secondary | ICD-10-CM | POA: Diagnosis present

## 2018-06-05 DIAGNOSIS — O99323 Drug use complicating pregnancy, third trimester: Secondary | ICD-10-CM

## 2018-06-05 LAB — URINALYSIS, ROUTINE W REFLEX MICROSCOPIC
BILIRUBIN URINE: NEGATIVE
Glucose, UA: NEGATIVE mg/dL
HGB URINE DIPSTICK: NEGATIVE
Ketones, ur: NEGATIVE mg/dL
NITRITE: NEGATIVE
PH: 7 (ref 5.0–8.0)
Protein, ur: NEGATIVE mg/dL
Specific Gravity, Urine: 1.009 (ref 1.005–1.030)

## 2018-06-05 LAB — CBC
HCT: 32.1 % — ABNORMAL LOW (ref 36.0–46.0)
Hemoglobin: 10.8 g/dL — ABNORMAL LOW (ref 12.0–15.0)
MCH: 27.8 pg (ref 26.0–34.0)
MCHC: 33.6 g/dL (ref 30.0–36.0)
MCV: 82.5 fL (ref 78.0–100.0)
Platelets: 145 10*3/uL — ABNORMAL LOW (ref 150–400)
RBC: 3.89 MIL/uL (ref 3.87–5.11)
RDW: 15 % (ref 11.5–15.5)
WBC: 8.7 10*3/uL (ref 4.0–10.5)

## 2018-06-05 LAB — COMPREHENSIVE METABOLIC PANEL
ALBUMIN: 2.9 g/dL — AB (ref 3.5–5.0)
ALT: 11 U/L (ref 0–44)
ANION GAP: 8 (ref 5–15)
AST: 20 U/L (ref 15–41)
Alkaline Phosphatase: 245 U/L — ABNORMAL HIGH (ref 38–126)
BUN: 12 mg/dL (ref 6–20)
CHLORIDE: 108 mmol/L (ref 98–111)
CO2: 20 mmol/L — AB (ref 22–32)
Calcium: 8.4 mg/dL — ABNORMAL LOW (ref 8.9–10.3)
Creatinine, Ser: 0.57 mg/dL (ref 0.44–1.00)
GFR calc Af Amer: >60 mL/min (ref >60)
GFR calc non Af Amer: >60 mL/min (ref >60)
GLUCOSE: 71 mg/dL (ref 70–99)
Potassium: 4.3 mmol/L (ref 3.5–5.1)
SODIUM: 136 mmol/L (ref 135–145)
Total Bilirubin: 0.4 mg/dL (ref 0.3–1.2)
Total Protein: 6.5 g/dL (ref 6.5–8.1)

## 2018-06-05 LAB — RAPID URINE DRUG SCREEN, HOSP PERFORMED
AMPHETAMINES: NOT DETECTED
BARBITURATES: NOT DETECTED
Benzodiazepines: NOT DETECTED
Cocaine: NOT DETECTED
Opiates: NOT DETECTED
TETRAHYDROCANNABINOL: NOT DETECTED

## 2018-06-05 LAB — TYPE AND SCREEN
ABO/RH(D): A NEG
ANTIBODY SCREEN: NEGATIVE

## 2018-06-05 SURGERY — Surgical Case
Anesthesia: Spinal | Site: Abdomen | Wound class: Clean Contaminated

## 2018-06-05 MED ORDER — OXYTOCIN 10 UNIT/ML IJ SOLN
INTRAMUSCULAR | Status: AC
  Filled 2018-06-05: qty 4

## 2018-06-05 MED ORDER — ONDANSETRON HCL 4 MG/2ML IJ SOLN
INTRAMUSCULAR | Status: AC
  Filled 2018-06-05: qty 2

## 2018-06-05 MED ORDER — SENNOSIDES-DOCUSATE SODIUM 8.6-50 MG PO TABS
2.0000 | ORAL_TABLET | ORAL | Status: DC
  Administered 2018-06-06 – 2018-06-07 (×3): 2 via ORAL
  Filled 2018-06-05 (×3): qty 2

## 2018-06-05 MED ORDER — MENTHOL 3 MG MT LOZG: 1.0000 | LOZENGE | OROMUCOSAL | Status: DC | PRN

## 2018-06-05 MED ORDER — OXYTOCIN 10 UNIT/ML IJ SOLN
INTRAMUSCULAR | Status: DC | PRN
  Administered 2018-06-05: 40 [IU] via INTRAVENOUS

## 2018-06-05 MED ORDER — NALBUPHINE HCL 10 MG/ML IJ SOLN
INTRAMUSCULAR | Status: AC
  Filled 2018-06-05: qty 1

## 2018-06-05 MED ORDER — FENTANYL CITRATE (PF) 100 MCG/2ML IJ SOLN
INTRAMUSCULAR | Status: DC | PRN
  Administered 2018-06-05: 15 ug via INTRATHECAL

## 2018-06-05 MED ORDER — DIPHENHYDRAMINE HCL 25 MG PO CAPS
25.0000 mg | ORAL_CAPSULE | Freq: Four times a day (QID) | ORAL | Status: DC | PRN
  Administered 2018-06-06 (×3): 25 mg via ORAL
  Filled 2018-06-05 (×3): qty 1

## 2018-06-05 MED ORDER — LACTATED RINGERS IV SOLN
INTRAVENOUS | Status: DC
Start: 2018-06-05 — End: 2018-06-08
  Administered 2018-06-06: 04:00:00 via INTRAVENOUS

## 2018-06-05 MED ORDER — DIPHENHYDRAMINE HCL 50 MG/ML IJ SOLN
INTRAMUSCULAR | Status: AC
  Filled 2018-06-05: qty 1

## 2018-06-05 MED ORDER — PHENYLEPHRINE 8 MG IN D5W 100 ML (0.08MG/ML) PREMIX OPTIME
INJECTION | INTRAVENOUS | Status: DC | PRN
  Administered 2018-06-05: 6 ug/min via INTRAVENOUS

## 2018-06-05 MED ORDER — LACTATED RINGERS IV SOLN
INTRAVENOUS | Status: DC | PRN
  Administered 2018-06-05: 17:00:00 via INTRAVENOUS

## 2018-06-05 MED ORDER — GENTAMICIN SULFATE 40 MG/ML IJ SOLN
INTRAVENOUS | Status: AC
  Administered 2018-06-05: 1200 mg via INTRAVENOUS
  Filled 2018-06-05: qty 7.5

## 2018-06-05 MED ORDER — BUPIVACAINE IN DEXTROSE 0.75-8.25 % IT SOLN
INTRATHECAL | Status: DC | PRN
  Administered 2018-06-05: 1.6 mg via INTRATHECAL

## 2018-06-05 MED ORDER — ZOLPIDEM TARTRATE 5 MG PO TABS: 5.0000 mg | ORAL_TABLET | Freq: Every evening | ORAL | Status: DC | PRN

## 2018-06-05 MED ORDER — DIBUCAINE 1 % RE OINT: 1.0000 "application " | TOPICAL_OINTMENT | RECTAL | Status: DC | PRN

## 2018-06-05 MED ORDER — MORPHINE SULFATE (PF) 0.5 MG/ML IJ SOLN
INTRAMUSCULAR | Status: DC | PRN
  Administered 2018-06-05: 150 ug via EPIDURAL

## 2018-06-05 MED ORDER — MORPHINE SULFATE (PF) 0.5 MG/ML IJ SOLN
INTRAMUSCULAR | Status: AC
  Filled 2018-06-05: qty 10

## 2018-06-05 MED ORDER — COCONUT OIL OIL: 1.0000 "application " | TOPICAL_OIL | Status: DC | PRN

## 2018-06-05 MED ORDER — PRENATAL MULTIVITAMIN CH
1.0000 | ORAL_TABLET | Freq: Every day | ORAL | Status: DC
  Administered 2018-06-06 – 2018-06-08 (×3): 1 via ORAL
  Filled 2018-06-05 (×3): qty 1

## 2018-06-05 MED ORDER — ACETAMINOPHEN 500 MG PO TABS
1000.0000 mg | ORAL_TABLET | Freq: Once | ORAL | Status: AC
  Administered 2018-06-05: 1000 mg via ORAL

## 2018-06-05 MED ORDER — TETANUS-DIPHTH-ACELL PERTUSSIS 5-2.5-18.5 LF-MCG/0.5 IM SUSP: 0.5000 mL | Freq: Once | INTRAMUSCULAR | Status: DC

## 2018-06-05 MED ORDER — ACETAMINOPHEN 325 MG PO TABS
650.0000 mg | ORAL_TABLET | ORAL | Status: DC | PRN
  Administered 2018-06-06 – 2018-06-07 (×5): 650 mg via ORAL
  Filled 2018-06-05 (×6): qty 2

## 2018-06-05 MED ORDER — LACTATED RINGERS IV SOLN
INTRAVENOUS | Status: DC | PRN
  Administered 2018-06-05: 17:00:00 via INTRAVENOUS
  Administered 2018-06-05: 1000 mL
  Administered 2018-06-05 (×2): via INTRAVENOUS

## 2018-06-05 MED ORDER — SIMETHICONE 80 MG PO CHEW
80.0000 mg | CHEWABLE_TABLET | Freq: Three times a day (TID) | ORAL | Status: DC
  Administered 2018-06-06 – 2018-06-08 (×7): 80 mg via ORAL
  Filled 2018-06-05 (×7): qty 1

## 2018-06-05 MED ORDER — NALBUPHINE HCL 10 MG/ML IJ SOLN
5.0000 mg | Freq: Once | INTRAMUSCULAR | Status: AC
  Administered 2018-06-05: 5 mg via INTRAVENOUS

## 2018-06-05 MED ORDER — NICOTINE 14 MG/24HR TD PT24
14.0000 mg | MEDICATED_PATCH | Freq: Every day | TRANSDERMAL | Status: DC
  Administered 2018-06-05 – 2018-06-06 (×2): 14 mg via TRANSDERMAL
  Filled 2018-06-05 (×5): qty 1

## 2018-06-05 MED ORDER — ACETAMINOPHEN 500 MG PO TABS
ORAL_TABLET | ORAL | Status: AC
  Administered 2018-06-05: 1000 mg via ORAL
  Filled 2018-06-05: qty 2

## 2018-06-05 MED ORDER — ONDANSETRON HCL 4 MG/2ML IJ SOLN
INTRAMUSCULAR | Status: DC | PRN
  Administered 2018-06-05: 4 mg via INTRAVENOUS

## 2018-06-05 MED ORDER — IBUPROFEN 600 MG PO TABS
600.0000 mg | ORAL_TABLET | Freq: Four times a day (QID) | ORAL | Status: DC
  Administered 2018-06-05 – 2018-06-08 (×11): 600 mg via ORAL
  Filled 2018-06-05 (×11): qty 1

## 2018-06-05 MED ORDER — SCOPOLAMINE 1 MG/3DAYS TD PT72
MEDICATED_PATCH | TRANSDERMAL | Status: DC | PRN
  Administered 2018-06-05: 1 via TRANSDERMAL

## 2018-06-05 MED ORDER — OXYTOCIN 40 UNITS IN LACTATED RINGERS INFUSION - SIMPLE MED: 2.5000 [IU]/h | INTRAVENOUS | Status: AC

## 2018-06-05 MED ORDER — SIMETHICONE 80 MG PO CHEW
80.0000 mg | CHEWABLE_TABLET | ORAL | Status: DC
  Administered 2018-06-06 – 2018-06-07 (×3): 80 mg via ORAL
  Filled 2018-06-05 (×3): qty 1

## 2018-06-05 MED ORDER — SUCCINYLCHOLINE CHLORIDE 200 MG/10ML IV SOSY
PREFILLED_SYRINGE | INTRAVENOUS | Status: AC
  Filled 2018-06-05: qty 10

## 2018-06-05 MED ORDER — DIPHENHYDRAMINE HCL 50 MG/ML IJ SOLN: 12.5000 mg | Freq: Once | INTRAMUSCULAR | Status: DC

## 2018-06-05 MED ORDER — SIMETHICONE 80 MG PO CHEW
80.0000 mg | CHEWABLE_TABLET | ORAL | Status: DC | PRN
Start: 2018-06-05 — End: 2018-06-08

## 2018-06-05 MED ORDER — FENTANYL CITRATE (PF) 100 MCG/2ML IJ SOLN
INTRAMUSCULAR | Status: AC
  Filled 2018-06-05: qty 2

## 2018-06-05 MED ORDER — ENOXAPARIN SODIUM 40 MG/0.4ML ~~LOC~~ SOLN
40.0000 mg | SUBCUTANEOUS | Status: DC
  Filled 2018-06-05 (×2): qty 0.4

## 2018-06-05 MED ORDER — OXYCODONE HCL 5 MG PO TABS
5.0000 mg | ORAL_TABLET | ORAL | Status: DC | PRN
  Administered 2018-06-06 – 2018-06-07 (×2): 5 mg via ORAL
  Filled 2018-06-05 (×2): qty 1

## 2018-06-05 MED ORDER — WITCH HAZEL-GLYCERIN EX PADS: 1.0000 "application " | MEDICATED_PAD | CUTANEOUS | Status: DC | PRN

## 2018-06-05 MED ORDER — OXYCODONE HCL 5 MG PO TABS
10.0000 mg | ORAL_TABLET | ORAL | Status: DC | PRN
  Administered 2018-06-06 – 2018-06-08 (×9): 10 mg via ORAL
  Filled 2018-06-05 (×9): qty 2

## 2018-06-05 SURGICAL SUPPLY — 34 items
APL SKNCLS STERI-STRIP NONHPOA (GAUZE/BANDAGES/DRESSINGS) ×1
BENZOIN TINCTURE PRP APPL 2/3 (GAUZE/BANDAGES/DRESSINGS) ×2 IMPLANT
CHLORAPREP W/TINT 26ML (MISCELLANEOUS) ×2 IMPLANT
CLAMP CORD UMBIL (MISCELLANEOUS) IMPLANT
CLOTH BEACON ORANGE TIMEOUT ST (SAFETY) ×2 IMPLANT
DRSG OPSITE POSTOP 4X10 (GAUZE/BANDAGES/DRESSINGS) ×2 IMPLANT
ELECT REM PT RETURN 9FT ADLT (ELECTROSURGICAL) ×2
ELECTRODE REM PT RTRN 9FT ADLT (ELECTROSURGICAL) ×1 IMPLANT
EXTRACTOR VACUUM KIWI (MISCELLANEOUS) IMPLANT
GLOVE BIOGEL PI IND STRL 7.0 (GLOVE) ×1 IMPLANT
GLOVE BIOGEL PI IND STRL 9 (GLOVE) ×1 IMPLANT
GLOVE BIOGEL PI INDICATOR 7.0 (GLOVE) ×1
GLOVE BIOGEL PI INDICATOR 9 (GLOVE) ×1
GLOVE SS PI 9.0 STRL (GLOVE) ×2 IMPLANT
GOWN STRL REUS W/TWL 2XL LVL3 (GOWN DISPOSABLE) ×2 IMPLANT
GOWN STRL REUS W/TWL LRG LVL3 (GOWN DISPOSABLE) ×2 IMPLANT
NEEDLE HYPO 25X5/8 SAFETYGLIDE (NEEDLE) IMPLANT
NS IRRIG 1000ML POUR BTL (IV SOLUTION) ×2 IMPLANT
PACK C SECTION WH (CUSTOM PROCEDURE TRAY) ×2 IMPLANT
PAD OB MATERNITY 4.3X12.25 (PERSONAL CARE ITEMS) ×2 IMPLANT
PENCIL SMOKE EVAC W/HOLSTER (ELECTROSURGICAL) ×2 IMPLANT
RTRCTR C-SECT PINK 25CM LRG (MISCELLANEOUS) IMPLANT
RTRCTR C-SECT PINK 34CM XLRG (MISCELLANEOUS) IMPLANT
STRIP CLOSURE SKIN 1/2X4 (GAUZE/BANDAGES/DRESSINGS) ×2 IMPLANT
SUT MNCRL 0 VIOLET CTX 36 (SUTURE) ×2 IMPLANT
SUT MONOCRYL 0 CTX 36 (SUTURE) ×2
SUT VIC AB 0 CT1 27 (SUTURE) ×2
SUT VIC AB 0 CT1 27XBRD ANBCTR (SUTURE) ×1 IMPLANT
SUT VIC AB 2-0 CT1 27 (SUTURE) ×2
SUT VIC AB 2-0 CT1 TAPERPNT 27 (SUTURE) ×1 IMPLANT
SUT VIC AB 4-0 KS 27 (SUTURE) ×2 IMPLANT
SYR BULB IRRIGATION 50ML (SYRINGE) IMPLANT
TOWEL OR 17X24 6PK STRL BLUE (TOWEL DISPOSABLE) ×2 IMPLANT
TRAY FOLEY W/BAG SLVR 14FR LF (SET/KITS/TRAYS/PACK) ×2 IMPLANT

## 2018-06-05 NOTE — Interval H&P Note (Signed)
History and Physical Interval Note:  06/05/2018 3:35 PM  Laurie [name]  has presented today for surgery, with the diagnosis of PREGNANCY 39 WEEKS, PREVIOUS C-SECTION TIMES 2  The various methods of treatment have been discussed with the patient and family. After consideration of risks, benefits and other options for treatment, the patient has consented to  Procedure(s): REPEAT CESAREAN SECTION (N/A) as a surgical intervention. Pt confirms that she has not decided on contraception .  The patient's history has been reviewed, patient examined, no change in status, stable for surgery.  I have reviewed the patient's chart and labs.  Questions were answered to the patient's satisfaction.   CBC    Component Value Date/Time   WBC 8.7 06/05/2018 1331   RBC 3.89 06/05/2018 1331   HGB 10.8 (L) 06/05/2018 1331   HGB 10.3 (L) 01/09/2016 1538   HCT 32.1 (L) 06/05/2018 1331   HCT 31.7 (L) 01/09/2016 1538   PLT 145 (L) 06/05/2018 1331   PLT 146 (L) 01/09/2016 1538   MCV 82.5 06/05/2018 1331   MCV 84 01/09/2016 1538   MCH 27.8 06/05/2018 1331   MCHC 33.6 06/05/2018 1331   RDW 15.0 06/05/2018 1331   RDW 13.6 01/09/2016 1538   LYMPHSABS 3.1 05/09/2018 1725   LYMPHSABS 2.8 01/09/2016 1538   MONOABS 0.4 05/09/2018 1725   EOSABS 0.1 05/09/2018 1725   EOSABS 0.1 01/09/2016 1538   BASOSABS 0.0 05/09/2018 1725   BASOSABS 0.1 01/09/2016 1538     [name] [name] [name]

## 2018-06-05 NOTE — Op Note (Signed)
Please see the brief operative note for details 

## 2018-06-05 NOTE — H&P (Signed)
Laurie [name] is a 22 y.o. female [phone]  At 5347w0d  presenting for repeat cesarean section. .prenatal course is notable for noncompliance with regular visits, acknowledged use of illicit substances with + UDS, and reports that she has been clean recently of illicit drug use.  Hx Hep C +, GBS POS. NEGATIVE HIV. OB History    Gravida  4   Para  2   Term  1   Preterm  1   AB  1   Living  2     SAB  1   TAB  0   Ectopic  0   Multiple  0   Live Births  2          Past Medical History:  Diagnosis Date  . Anxiety   . Asthma    "grew out"  . Chlamydia   . Depression    doing really good  . Gestational diabetes    with first two pregnancies  . Hepatitis C June 2016  . History of heroin abuse   . Infection    UTI  . Polysubstance abuse (HCC) 05/28/2018   UDS positive for cocaine, MJ, meth, opioids  . Type A blood, Rh negative    Past Surgical History:  Procedure Laterality Date  . CESAREAN SECTION N/A 01/11/2016   Procedure: CESAREAN SECTION;  Surgeon: Brock Badharles A Harper, MD;  Location: [facility];  Service: Obstetrics;  Laterality: N/A;  . CESAREAN SECTION N/A 12/21/2016   Procedure: REPEAT CESAREAN SECTION;  Surgeon: Adam PhenixJames G Arnold, MD;  [facility];  Service: Obstetrics;  Laterality: N/A;   Family History: family history includes Cancer in her paternal grandmother; Diabetes in her mother; Epilepsy in her brother; Pulmonary embolism in her mother; Stroke in her mother. Social History:  reports that she has been smoking cigarettes. She has been smoking about 0.25 packs per day. She has never used smokeless tobacco. She reports that she has current or past drug history. Drugs: Marijuana, Heroin, Methamphetamines, Cocaine, and Other-see comments. She reports that she does not drink alcohol.     Maternal Diabetes: No Genetic Screening: Normal Maternal Ultrasounds/Referrals: Normal Fetal Ultrasounds or other Referrals:  None Maternal Substance Abuse:  Yes:   Type: Marijuana, Cocaine, Other: amphetamines.opiates Significant Maternal Medications:  None Significant Maternal Lab Results:  Lab values include: Other:  Hep C pos. Other Comments:  None  ROS History   Blood pressure (!) 113/59, pulse 67, temperature 98.7 F (37.1 C), temperature source Oral, resp. rate 16, height 5\' 3"  (1.6 m), weight 70.8 kg, last menstrual period 08/22/2017, unknown if currently breastfeeding. Exam Physical Exam  Constitutional: She appears well-developed and well-nourished.  HENT:  Head: Normocephalic.  Eyes: Pupils are equal, round, and reactive to light.  Neck: Normal range of motion.  Cardiovascular: Normal rate.  GI: Soft.  Gravid uterus, normal size for gest age.  Genitourinary: Vagina normal.    Prenatal labs: ABO, Rh: --/--/A NEG (08/08 0416) Antibody: NEG (08/08 0416) Rubella: <0.90 (07/20 1725) RPR: Non Reactive (07/20 1725)  HBsAg: Negative (07/20 1725)  HIV: Non Reactive (07/20 1725)  GBS:   pos CBC    Component Value Date/Time   WBC 8.7 06/05/2018 1331   RBC 3.89 06/05/2018 1331   HGB 10.8 (L) 06/05/2018 1331   HGB 10.3 (L) 01/09/2016 1538   HCT 32.1 (L) 06/05/2018 1331   HCT 31.7 (L) 01/09/2016 1538   PLT 145 (L) 06/05/2018 1331   PLT 146 (L) 01/09/2016 1538  MCV 82.5 06/05/2018 1331   MCV 84 01/09/2016 1538   MCH 27.8 06/05/2018 1331   MCHC 33.6 06/05/2018 1331   RDW 15.0 06/05/2018 1331   RDW 13.6 01/09/2016 1538   LYMPHSABS 3.1 05/09/2018 1725   LYMPHSABS 2.8 01/09/2016 1538   MONOABS 0.4 05/09/2018 1725   EOSABS 0.1 05/09/2018 1725   EOSABS 0.1 01/09/2016 1538   BASOSABS 0.0 05/09/2018 1725   BASOSABS 0.1 01/09/2016 1538   CMP Latest Ref Rng & Units 06/05/2018 01/30/2018 10/10/2017  Glucose 70 - 99 mg/dL 71 91 94  BUN 6 - 20 mg/dL 12 12 10   Creatinine 0.44 - 1.00 mg/dL 1.300.57 8.650.50 7.840.77  Sodium 135 - 145 mmol/L 136 140 137  Potassium 3.5 - 5.1 mmol/L 4.3 4.2 3.1(L)  Chloride 98 - 111 mmol/L 108 106 105  CO2 22 - 32  mmol/L 20(L) 24 21(L)  Calcium 8.9 - 10.3 mg/dL 6.9(G8.4(L) 9.1 9.1  Total Protein 6.5 - 8.1 g/dL 6.5 - -  Total Bilirubin 0.3 - 1.2 mg/dL 0.4 - -  Alkaline Phos 38 - 126 U/L 245(H) - -  AST 15 - 41 U/L 20 - -  ALT 0 - 44 U/L 11 - -    Assessment/Plan: Repeat cesarean section.at 39 wk HEP C pos by hx  UDS to be repeated.    Tilda BurrowJohn V Albertina Leise 06/05/2018, 2:06 PM

## 2018-06-05 NOTE — Anesthesia Postprocedure Evaluation (Signed)
Anesthesia Post Note  Patient: Laurie [name]  Procedure(s) Performed: REPEAT CESAREAN SECTION (N/A Abdomen)     Patient location during evaluation: PACU Anesthesia Type: Spinal Level of consciousness: oriented and awake and alert Pain management: pain level controlled Vital Signs Assessment: post-procedure vital signs reviewed and stable Respiratory status: spontaneous breathing, respiratory function stable and patient connected to nasal cannula oxygen Cardiovascular status: blood pressure returned to baseline and stable Postop Assessment: no headache, no backache and no apparent nausea or vomiting Anesthetic complications: no    Last Vitals:  Vitals:   06/05/18 1832 06/05/18 1848  BP:  (!) 93/51  Pulse:    Resp:  17  Temp: 36.4 C   SpO2:      Last Pain:  Vitals:   06/05/18 1845  TempSrc:   PainSc: 0-No pain   Pain Goal:                 Tevion Laforge L Zannie Runkle

## 2018-06-05 NOTE — Lactation Note (Signed)
This note was copied from a baby's chart. Lactation Consultation Note  Patient Name: Boy Danna HeftySkylar Difabio Today's Date: 06/05/2018   [facility] Hyacinth MeekerMiller about care plan for infant. Mother has Hx of non-compliance for polysubstance abuse and UDS tested (+) on 05/09/2018 for THC, amphetamines, opioids, meth and cocaine. Current protocol advises not to BF in these cases, Dr. Hyacinth MeekerMiller agreed to change care plan to "not breastfeeding" instead of "normal newborn care". RN [name] updated with current plan.   Maternal Data    Feeding Feeding Type: Breast Fed Length of feed: (couple of sucks)  LATCH Score Latch: Repeated attempts needed to sustain latch, nipple held in mouth throughout feeding, stimulation needed to elicit sucking reflex.  Audible Swallowing: A few with stimulation  Type of Nipple: Everted at rest and after stimulation  Comfort (Breast/Nipple): Soft / non-tender  Hold (Positioning): Assistance needed to correctly position infant at breast and maintain latch.  LATCH Score: 7  Interventions    Lactation Tools Discussed/Used     Consult Status      Anuradha Chabot Venetia ConstableS Lurene Robley 06/05/2018, 10:28 PM

## 2018-06-05 NOTE — Anesthesia Procedure Notes (Signed)
Spinal  Patient location during procedure: OR Start time: 06/05/2018 4:50 PM End time: 06/05/2018 5:00 PM Staffing Anesthesiologist: Elmer PickerWoodrum, Zakary Kimura L, MD Performed: anesthesiologist  Preanesthetic Checklist Completed: patient identified, surgical consent, pre-op evaluation, timeout performed, IV checked, risks and benefits discussed and monitors and equipment checked Spinal Block Patient position: sitting Prep: site prepped and draped and DuraPrep Patient monitoring: cardiac monitor, continuous pulse ox and blood pressure Approach: midline Location: L3-4 Injection technique: single-shot Needle Needle type: Pencan  Needle gauge: 24 G Needle length: 9 cm Assessment Sensory level: T6 Additional Notes Functioning IV was confirmed and monitors were applied. Sterile prep and drape, including hand hygiene and sterile gloves were used. The patient was positioned and the spine was prepped. The skin was anesthetized with lidocaine.  Free flow of clear CSF was obtained prior to injecting local anesthetic into the CSF.  The spinal needle aspirated freely following injection.  The needle was carefully withdrawn.  The patient tolerated the procedure well.

## 2018-06-05 NOTE — Transfer of Care (Signed)
Immediate Anesthesia Transfer of Care Note  Patient: Laurie [name]  Procedure(s) Performed: REPEAT CESAREAN SECTION (N/A Abdomen)  Patient Location: PACU  Anesthesia Type:Spinal  Level of Consciousness: awake, alert  and oriented  Airway & Oxygen Therapy: Patient Spontanous Breathing  Post-op Assessment: Report given to RN and Post -op Vital signs reviewed and stable  Post vital signs: Reviewed and stable  Last Vitals:  Vitals Value Taken Time  BP    Temp    Pulse    Resp    SpO2      Last Pain:  Vitals:   06/05/18 1344  TempSrc: Oral  PainSc:          Complications: No apparent anesthesia complications

## 2018-06-05 NOTE — Anesthesia Preprocedure Evaluation (Signed)
Anesthesia Evaluation  Patient identified by MRN, date of birth, ID band Patient awake    Reviewed: Allergy & Precautions, NPO status , Patient's Chart, lab work & pertinent test results  Airway Mallampati: II  TM Distance: >3 FB Neck ROM: Full    Dental  (+) Poor Dentition, Dental Advisory Given   Pulmonary asthma , Current Smoker,    breath sounds clear to auscultation       Cardiovascular negative cardio ROS   Rhythm:Regular Rate:Normal     Neuro/Psych Anxiety Depression negative neurological ROS     GI/Hepatic negative GI ROS, (+)     substance abuse  cocaine use, marijuana use, methamphetamine use and IV drug use, Hepatitis -, C  Endo/Other  negative endocrine ROS  Renal/GU negative Renal ROS  negative genitourinary   Musculoskeletal negative musculoskeletal ROS (+)   Abdominal   Peds  Hematology negative hematology ROS (+)   Anesthesia Other Findings   Reproductive/Obstetrics (+) Pregnancy Two previous c/s                             Anesthesia Physical Anesthesia Plan  ASA: III  Anesthesia Plan: Spinal   Post-op Pain Management:    Induction:   PONV Risk Score and Plan: Treatment may vary due to age or medical condition  Airway Management Planned: Natural Airway  Additional Equipment:   Intra-op Plan:   Post-operative Plan:   Informed Consent: I have reviewed the patients History and Physical, chart, labs and discussed the procedure including the risks, benefits and alternatives for the proposed anesthesia with the patient or authorized representative who has indicated his/her understanding and acceptance.   Dental advisory given  Plan Discussed with: Anesthesiologist and CRNA  Anesthesia Plan Comments: (Patient identified. Risks, benefits, options discussed with patient including but not limited to bleeding, infection, nerve damage, paralysis, failed block,  incomplete pain control, headache, blood pressure changes, nausea, vomiting, reactions to medication, itching, and post partum back pain. Confirmed with bedside nurse the patient's most recent platelet count. Confirmed with the patient that they are not taking any anticoagulation, have any bleeding history or any family history of bleeding disorders. Patient expressed understanding and wishes to proceed. All questions were answered. )        Anesthesia Quick Evaluation

## 2018-06-05 NOTE — Op Note (Signed)
06/05/2018  6:15 PM  PATIENT:  Insurance risk surveyorkylar E [name]  22 y.o. female  PRE-OPERATIVE DIAGNOSIS:  PREGNANCY 39 WEEKS, PREVIOUS C-SECTION TIMES 2, hepatitis C  POST-OPERATIVE DIAGNOSIS:  PREGNANCY 39 WEEKS, PREVIOUS C-SECTION TIMES 2 hepatitis C, delivered  PROCEDURE:  Procedure(s): REPEAT CESAREAN SECTION (N/A) repeat low transverse cervical cesarean section, 2 layer closure  SURGEON:  Surgeon(s) and Role:    * Tilda BurrowFerguson, Dahl Higinbotham V, MD - Primary  PHYSICIAN ASSISTANT:   ASSISTANTS: none   ANESTHESIA:   spinal  EBL:  850 mL   BLOOD ADMINISTERED:none  DRAINS: Urinary Catheter (Foley)   LOCAL MEDICATIONS USED:  NONE  SPECIMEN:  Source of Specimen:  Placenta to labor and delivery NOTE placenta had a large cotyledon that stayed on the uterus and required manual removal  DISPOSITION OF SPECIMEN:  PATHOLOGY  COUNTS:  YES  TOURNIQUET:  * No tourniquets in log *  DICTATION: .Dragon Dictation  PLAN OF CARE: Admit to inpatient   PATIENT DISPOSITION:  PACU - hemodynamically stable.   Delay start of Pharmacological VTE agent (>24hrs) due to surgical blood loss or risk of bleeding: not applicable Details of procedure: Patient was taken the operating room prepped and draped for lower abdominal surgery with timeout conducted, and the biotics administered, gentamicin and Cleocin,, and procedure confirmed by operative team.  Transverse lower abdominal incision was repeated with excision of the old cicatrix, sharp dissection to the fascia which was opened transversely, and the peritoneal cavity entered in the midline without difficulty.  There were no adhesions in the bladder flap area or on the anterior uterus.  Transverse uterine incision was made through a very thin lower uterine segment at the site of previous incisions.  The fetal vertex was rotated into the incision and delivered easily with fundal pressure.  Cord was clamped at 1 minute.  Please see pediatrician's notes for further information on  the baby.  Placenta was delivered in response to IV oxytocin and crede uterine massage.  There was a large cotyledon that stayed attached to the posterior uterine fundal implantation site and required manual removal.  Upon completion the uterus appeared properly intact.  The uterus was exteriorized and maximize ability to remove the placenta.  There were no membrane remnants suspected.  Uterus was closed in 2 layer fashion 0 Monocryl suture, running locking first layer and continuous running second layer abdomen was irrigated.  The uterus was turned to the abdomen, the anterior peritoneum closed with running 2-0 Vicryl the fascia closed with running 0 Vicryl trimming a thin portion of the thicker fibrotic fascia off in the midline to improve tissue reapproximation. Subcuticular interrupted 2 oh plain's were used x5 locations to reapproximate subcutaneous fatty tissue and subcuticular 4-0 Vicryl completed skin closure.  Sponge and needle counts were correct patient to recovery room in stable condition with 800 cc blood loss

## 2018-06-05 NOTE — Interval H&P Note (Signed)
History and Physical Interval Note:  06/05/2018 3:37 PM  Laurie [name]  has presented today for surgery, with the diagnosis of PREGNANCY 39 WEEKS, PREVIOUS C-SECTION TIMES 2  The various methods of treatment have been discussed with the patient and family. After consideration of risks, benefits and other options for treatment, the patient has consented to  Procedure(s): REPEAT CESAREAN SECTION (N/A) as a surgical intervention .  The patient's history has been reviewed, patient examined, no change in status, stable for surgery.  I have reviewed the patient's chart and labs.  Questions were answered to the patient's satisfaction. SHE IS NO LONGER CONSIDERING TUBAL LIGATION AT THIS TIME, AS SHE HAS TOO MUCH UNCERTAINTY IN HER DECISIONMAKING TO DECIDE AT THIS TIME.  THE CONSENT IS JUST FOR REPEAT CESAREAN SECTION.   Tilda BurrowJohn V Raven Furnas

## 2018-06-06 ENCOUNTER — Encounter (HOSPITAL_COMMUNITY): Payer: Self-pay | Admitting: Obstetrics and Gynecology

## 2018-06-06 DIAGNOSIS — Z3A39 39 weeks gestation of pregnancy: Secondary | ICD-10-CM

## 2018-06-06 DIAGNOSIS — O34211 Maternal care for low transverse scar from previous cesarean delivery: Secondary | ICD-10-CM

## 2018-06-06 LAB — RPR: RPR Ser Ql: NONREACTIVE

## 2018-06-06 LAB — CBC
HCT: 25.6 % — ABNORMAL LOW (ref 36.0–46.0)
Hemoglobin: 7.8 g/dL — ABNORMAL LOW (ref 12.0–15.0)
MCH: 25.7 pg — AB (ref 26.0–34.0)
MCHC: 30.5 g/dL (ref 30.0–36.0)
MCV: 84.5 fL (ref 78.0–100.0)
Platelets: 96 10*3/uL — ABNORMAL LOW (ref 150–400)
RBC: 3.03 MIL/uL — AB (ref 3.87–5.11)
RDW: 14.9 % (ref 11.5–15.5)
WBC: 8.7 10*3/uL (ref 4.0–10.5)

## 2018-06-06 LAB — CREATININE, SERUM
CREATININE: 0.63 mg/dL (ref 0.44–1.00)
GFR calc non Af Amer: >60 mL/min (ref >60)

## 2018-06-06 MED ORDER — RHO D IMMUNE GLOBULIN 1500 UNIT/2ML IJ SOSY
300.0000 ug | PREFILLED_SYRINGE | Freq: Once | INTRAMUSCULAR | Status: AC
  Administered 2018-06-06: 300 ug via INTRAVENOUS
  Filled 2018-06-06: qty 2

## 2018-06-06 NOTE — Lactation Note (Signed)
This note was copied from a baby's chart. Lactation Consultation Note  Patient Name: Laurie [name] Today's Date: 06/06/2018    RN had inquired about setting Mom up with a DEBP (see end of Dr. Odessa FlemingPuzio's note). After reviewing Mom's chart, I notified [name], RN that I did not suggest setting up a pump to facilitate lactogenesis II in a woman who had been noncompliant with prenatal visits & had tested positive last week for cocaine, amphetamines, marijuana, & opioids. History of heroin use also noted in 2016.   [name], [name] [facility] 06/06/2018, 11:50 PM

## 2018-06-06 NOTE — Anesthesia Postprocedure Evaluation (Signed)
Anesthesia Post Note  Patient: Lasheba E Caldwell  Procedure(s) Performed: REPEAT CESAREAN SECTION (N/A Abdomen)     Patient location during evaluation: Mother Baby Anesthesia Type: Spinal Level of consciousness: awake and alert Pain management: pain level controlled Vital Signs Assessment: post-procedure vital signs reviewed and stable Respiratory status: spontaneous breathing, nonlabored ventilation and respiratory function stable Cardiovascular status: stable Postop Assessment: no headache, no backache, spinal receding, able to ambulate, adequate PO intake, no apparent nausea or vomiting and patient able to bend at knees Anesthetic complications: no    Last Vitals:  Vitals:   06/06/18 0351 06/06/18 0815  BP: (!) 92/53 (!) 94/52  Pulse: (!) 55 (!) 52  Resp: 18 18  Temp: 36.5 C 36.5 C  SpO2: 100% 100%    Last Pain:  Vitals:   06/06/18 0815  TempSrc: Oral  PainSc: 4    Pain Goal:                 Laban EmperorMalinova,Beda Dula Hristova

## 2018-06-06 NOTE — Progress Notes (Addendum)
Subjective: Postpartum Day 1: Cesarean Delivery Patient reports generalized soreness and pressure at incision site. She endorses back pain as well that is controlled with pain medication. She reports that she has been occasionally lightheaded overnight but has had no difficulty walking and does not feel unsteady when doing so. She denies changes in vision, RUQ pain, chest pain, SOB, or excessive LE swelling. She is bottle feeding as she was told she is unable to breastfeed due to Hep C/substance abuse. She plans for depo for birth control. She plans no circumcision.  Objective: Vital signs in last 24 hours: Temp:  [97.4 F (36.3 C)-98.7 F (37.1 C)] 97.7 F (36.5 C) (08/17 0351) Pulse Rate:  [54-82] 55 (08/17 0351) Resp:  [12-20] 18 (08/17 0351) BP: (84-113)/(43-74) 92/53 (08/17 0351) SpO2:  [98 %-100 %] 100 % (08/17 0351) Weight:  [70.8 kg] 70.8 kg (08/16 1340)  Physical Exam:  General: alert and no distress Uterine Fundus: firm Incision: no significant drainage DVT Evaluation: No evidence of DVT seen on physical exam.  Recent Labs    06/05/18 1331 06/06/18 0535  HGB 10.8* 7.8*  HCT 32.1* 25.6*    Assessment/Plan: Laurie [name] is a 22 year-old [id number] who is POD #1 from [facility]. She is doing well postoperatively. Blood pressures hould continue to be monitored as she has been hypotensive overnight with occasional lightheadedness. Hemoglobin down to 7.8 this am from 10.8 yesterday. Plan for social work consult.   Natasha MeadBryan A Chadwick, MS3 06/06/2018, 7:32 AM  RESIDENT ADDENDUM I have separately seen and examined the patient. I have discussed the findings and exam with the medical student and agree with the above note. I helped develop the management plan that is described in the student's note, and I agree with the content.  Patient Vitals for the past 24 hrs:  BP Temp Temp src Pulse Resp SpO2 Height Weight  06/06/18 0815 (!) 94/52 97.7 F (36.5 C) Oral (!) 52 18 100 % - -  06/06/18  0351 (!) 92/53 97.7 F (36.5 C) Oral (!) 55 18 100 % - -  06/06/18 0150 - - - - - 98 % - -  06/06/18 0000 (!) 91/53 - - 68 18 - - -  06/05/18 2354 (!) 84/67 - - 67 18 98 % - -  06/05/18 2347 (!) 112/43 - - (!) 54 18 - - -  06/05/18 2300 (!) 102/54 (!) 97.4 F (36.3 C) Oral 66 18 100 % - -  06/05/18 2140 (!) 88/53 98.1 F (36.7 C) Oral 82 18 - - -  06/05/18 2050 (!) 106/48 97.7 F (36.5 C) Oral 62 18 98 % - -  06/05/18 1951 (!) 95/43 97.7 F (36.5 C) Oral 62 16 99 % - -  06/05/18 1930 (!) 100/58 - - - 15 100 % - -  06/05/18 1915 98/74 97.9 F (36.6 C) Oral - 12 98 % - -  06/05/18 1900 (!) 108/51 - - - 20 100 % - -  06/05/18 1848 (!) 93/51 - - - 17 - - -  06/05/18 1832 - 97.6 F (36.4 C) Oral - - - - -  06/05/18 1830 (!) 95/46 - - 63 13 99 % - -  06/05/18 1815 (!) 95/45 - - 60 13 100 % - -  06/05/18 1344 (!) 113/59 98.7 F (37.1 C) Oral 67 16 - - -  06/05/18 1340 - - - - - - 5\' 3"  (1.6 m) 70.8 kg    Honeycomb dressing is clean,  dry and intact. Patient reassured that skin is well approximated and healing as expected. Denies pain, questions, concerns at this time.  Clayton BiblesSamantha Yisell Sprunger, CNM 06/06/18  11:58 AM

## 2018-06-06 NOTE — Addendum Note (Signed)
Addendum  created 06/06/18 0836 by Elgie CongoMalinova, Reena Borromeo H, CRNA   Sign clinical note

## 2018-06-06 NOTE — Clinical Social Work Maternal (Signed)
CLINICAL SOCIAL WORK MATERNAL/CHILD NOTE  Patient Details  Name: CORALYNN GAONA MRN: [id number] Date of Birth: 1996/05/15  Date:  06/06/2018  Clinical Social Worker Initiating Note:  Madilyn Fireman, MSW, LCSW-A Date/Time: Initiated:  06/06/18/      Child's Name:  Casandra Doffing Toves   Biological Parents:  Mother, Father   Need for Interpreter:  None   Reason for Referral:  Current Substance Use/Substance Use During Pregnancy , Current CPS Involvement   Address:  [place] [address]    Phone number:  [phone] (home)     Additional phone number: [phone]  Household Members/Support Persons (HM/SP):   Household Member/Support Person 1   HM/SP Name Relationship DOB or Age  HM/SP -1 [place] Mother    HM/SP -2        HM/SP -3        HM/SP -4        HM/SP -5        HM/SP -6        HM/SP -7        HM/SP -8          Natural Supports (not living in the home):  Friends, Immediate Family   Professional Supports: None   Employment: Unemployed   Type of Work:     Education:  Other (comment)(MOB reports quitting school in 8th grade)   Homebound arranged:    Museum/gallery curator Resources:  [employer], Multimedia programmer   Other Resources:  [facility]   Cultural/Religious Considerations Which May Impact Care:  Christian  Strengths:  Home prepared for child , Ability to meet basic needs , Pediatrician chosen, Compliance with medical plan    Psychotropic Medications:         Pediatrician:    [facility] area  Pediatrician List:   [facility]  [place]      Pediatrician Fax Number:    Risk Factors/Current Problems:  Substance Use , Transportation    Cognitive State:  Alert , Able to Concentrate    Mood/Affect:  Calm , Comfortable    CSW Assessment: CSW received consult for MOB due to history of polysubstance use and current CPS  involvement. CSW met with MOB and baby Casandra Doffing at bedside to complete assessment. MOB reports being Hep C positive, diagnosed in 2015 and has not received any treatment. MOB reports that she began using substances during this pregnancy whenever her FOB and spouse Will Tamala Julian went to prison. MOB was positive for opioids, THC, meth, and cocaine on 05/09/18. MOB reports that she "is not a drug addict." MOB reports that she most recently used THC 2 weeks ago and meth 3 weeks ago. CSW informed MOB of infant's UDS results being negative. MOB reports that she has history of CPS involvement in the past including her first child being adopted and her son, Earnestine Mealing being in the custody of his maternal grandmother. MGM is IT trainer. MOB reports living with MGM, Earnestine Mealing, and now the newborn. MOB reports she quit school in 8th grade and never went back to finish. MOB reports having good support from her mother. CSW explained to [facility] drug screening policies, MOB stated understanding. MOB reports she is unemployed and relies her on family for transportation. MOB reports Casandra Doffing will receive pediatric care at [facility]. MOB reports she has a new car seat for [place]. MOB and CSW  thoroughly discussed safe sleep for [place]. MOB has [employer] supplemented by [employer], and does not receive WIC or [employer]. CSW and MOB discussed WIC as a resource for nutrition support and how to access the [facility] office. MOB stated that Casandra Doffing will sleep in a crib at home. MOB reports that the home is prepared for the baby's arrival.  MOB did not have questions and CSW encouraged MOB to reach out for assistance if needs arise. CSW informed MOB that CPS will likely be visiting with her and baby prior to discharge, MOB agreeable.  CSW made report to [place] accepted the case. CPS to visit with MOB and baby within 24 hours. There are barriers to discharge. CSW awaiting disposition plan from CPS.  CSW  Plan/Description:  CSW Will Continue to Monitor Umbilical Cord Tissue Drug Screen Results and Make Report if Warranted, CSW Awaiting CPS Disposition [place], [place] 06/06/2018, 12:45 PM

## 2018-06-07 LAB — RH IG WORKUP (INCLUDES ABO/RH)
ABO/RH(D): A NEG
FETAL SCREEN: NEGATIVE
Gestational Age(Wks): 39
UNIT DIVISION: 0

## 2018-06-07 LAB — HEPATITIS C VRS RNA DETECT BY PCR-QUAL: Hepatitis C Vrs RNA by PCR-Qual: POSITIVE — AB

## 2018-06-07 NOTE — Progress Notes (Signed)
Subjective: Postpartum Day 2: Cesarean Delivery Patient reports incisional pain, tolerating PO, + flatus and no problems voiding.    Objective: Vital signs in last 24 hours: Temp:  [97.9 F (36.6 C)-98.8 F (37.1 C)] 97.9 F (36.6 C) (08/18 0543) Pulse Rate:  [58-68] 58 (08/18 0543) Resp:  [18] 18 (08/18 0543) BP: (99-107)/(50-60) 105/51 (08/18 0543) SpO2:  [98 %-100 %] 100 % (08/17 2121)  Physical Exam:  General: alert, cooperative, appears stated age and no distress Lochia: appropriate Uterine Fundus: firm Incision: healing well, no significant drainage, no dehiscence, no significant erythema DVT Evaluation: No evidence of DVT seen on physical exam.  Recent Labs    06/05/18 1331 06/06/18 0535  HGB 10.8* 7.8*  HCT 32.1* 25.6*    Assessment/Plan: Status post Cesarean section. Doing well postoperatively.  Continue current care.  Laurie [name] 06/07/2018, 10:24 AM

## 2018-06-07 NOTE — Progress Notes (Signed)
Mom going out multiple times for "fresh air". Baby to nursery when she goes out. 

## 2018-06-07 NOTE — Lactation Note (Signed)
This note was copied from a baby's chart. Lactation Consultation Note  Patient Name: Laurie [name] Today's Date: 06/07/2018   RN informed LC of order by pediatrician for Lactation Consultation.  After reviewing patient's chart, decision made to not encouraged breastfeeding or facilitate use of a double pump.  Mom has history of non-compliance with her prenatal care, + for Hep C without treatment, and polysubstance abuse as recent as last month.  UDS neg on admission, but this LC feels formula would be a safer option in feeding this baby. RN aware.   Judee ClaraSmith, Jaremy Nosal E 06/07/2018, 11:35 AM

## 2018-06-07 NOTE — Progress Notes (Signed)
CSW spoke with Laurie [name], [facility] CPS to discuss discharge plan. Mr. Laurie [name] stated that [name], [facility] C[facility] completed a face to face visit and a safety plan for discharge. MOB is able to be discharged home with the newborn with Laurie [name]. MGM is Industrial/product designerCrystal [name]. No further barriers to discharge.  Edwin Dadaarol [name], MSW, LCSW-A Clinical Social Worker [facility] [phone]

## 2018-06-08 ENCOUNTER — Telehealth: Payer: Self-pay | Admitting: *Deleted

## 2018-06-08 DIAGNOSIS — O34211 Maternal care for low transverse scar from previous cesarean delivery: Secondary | ICD-10-CM

## 2018-06-08 DIAGNOSIS — Z3A39 39 weeks gestation of pregnancy: Secondary | ICD-10-CM

## 2018-06-08 LAB — STREP GP B SUSCEPTIBILITY

## 2018-06-08 LAB — HEMOGLOBIN AND HEMATOCRIT, BLOOD
HCT: 25.7 % — ABNORMAL LOW (ref 36.0–46.0)
HEMOGLOBIN: 7.9 g/dL — AB (ref 12.0–15.0)

## 2018-06-08 LAB — STREP GP B NAA+RFLX: Strep Gp B NAA+Rflx: POSITIVE — AB

## 2018-06-08 MED ORDER — PRENATAL MULTIVITAMIN CH
1.0000 | ORAL_TABLET | Freq: Every day | ORAL | 0 refills | Status: DC
Start: 2018-06-08 — End: 2023-09-30

## 2018-06-08 MED ORDER — FERROUS SULFATE 325 (65 FE) MG PO TABS: 325.0000 mg | ORAL_TABLET | Freq: Two times a day (BID) | ORAL | 0 refills | Status: DC

## 2018-06-08 MED ORDER — FERROUS SULFATE 325 (65 FE) MG PO TABS
325.0000 mg | ORAL_TABLET | Freq: Two times a day (BID) | ORAL | Status: DC
  Administered 2018-06-08: 325 mg via ORAL
  Filled 2018-06-08: qty 1

## 2018-06-08 MED ORDER — IBUPROFEN 600 MG PO TABS: 600.0000 mg | ORAL_TABLET | Freq: Four times a day (QID) | ORAL | 0 refills | Status: DC

## 2018-06-08 MED ORDER — NICOTINE 14 MG/24HR TD PT24: 14.0000 mg | MEDICATED_PATCH | Freq: Every day | TRANSDERMAL | 0 refills | Status: DC

## 2018-06-08 NOTE — Discharge Instructions (Signed)
High-Fiber Diet Fiber, also called dietary fiber, is a type of carbohydrate found in fruits, vegetables, whole grains, and beans. A high-fiber diet can have many health benefits. Your health care provider may recommend a high-fiber diet to help:  Prevent constipation. Fiber can make your bowel movements more regular.  Lower your cholesterol.  Relieve hemorrhoids, uncomplicated diverticulosis, or irritable bowel syndrome.  Prevent overeating as part of a weight-loss plan.  Prevent heart disease, type 2 diabetes, and certain cancers.  What is my plan? The recommended daily intake of fiber includes:  38 grams for men under age 67.  31 grams for men over age 71.  61 grams for women under age 60.  32 grams for women over age 13.  You can get the recommended daily intake of dietary fiber by eating a variety of fruits, vegetables, grains, and beans. Your health care provider may also recommend a fiber supplement if it is not possible to get enough fiber through your diet. What do I need to know about a high-fiber diet?  Fiber supplements have not been widely studied for their effectiveness, so it is better to get fiber through food sources.  Always check the fiber content on thenutrition facts label of any prepackaged food. Look for foods that contain at least 5 grams of fiber per serving.  Ask your dietitian if you have questions about specific foods that are related to your condition, especially if those foods are not listed in the following section.  Increase your daily fiber consumption gradually. Increasing your intake of dietary fiber too quickly may cause bloating, cramping, or gas.  Drink plenty of water. Water helps you to digest fiber. What foods can I eat? Grains Whole-grain breads. Multigrain cereal. Oats and oatmeal. Brown rice. Barley. Bulgur wheat. [place]. Bran muffins. Popcorn. Rye wafer crackers. Vegetables Sweet potatoes. Spinach. Kale. Artichokes. Cabbage.  Broccoli. Green peas. Carrots. Squash. Fruits Berries. Pears. Apples. Oranges. Avocados. Prunes and raisins. Dried figs. Meats and Other Protein Sources [place], kidney, pinto, and soy beans. Split peas. Lentils. Nuts and seeds. Dairy Fiber-fortified yogurt. Beverages Fiber-fortified soy milk. Fiber-fortified orange juice. Other Fiber bars. The items listed above may not be a complete list of recommended foods or beverages. Contact your dietitian for more options. What foods are not recommended? Grains White bread. Pasta made with refined flour. White rice. Vegetables Fried potatoes. Canned vegetables. Well-cooked vegetables. Fruits Fruit juice. Cooked, strained fruit. Meats and Other Protein Sources Fatty cuts of meat. Fried Sales executive or fried fish. Dairy Milk. Yogurt. Cream cheese. Sour cream. Beverages Soft drinks. Other Cakes and pastries. Butter and oils. The items listed above may not be a complete list of foods and beverages to avoid. Contact your dietitian for more information. What are some tips for including high-fiber foods in my diet?  Eat a wide variety of high-fiber foods.  Make sure that half of all grains consumed each day are whole grains.  Replace breads and cereals made from refined flour or white flour with whole-grain breads and cereals.  Replace white rice with brown rice, bulgur wheat, or millet.  Start the day with a breakfast that is high in fiber, such as a cereal that contains at least 5 grams of fiber per serving.  Use beans in place of meat in soups, salads, or pasta.  Eat high-fiber snacks, such as berries, raw vegetables, nuts, or popcorn. This information is not intended to replace advice given to you by your health care provider. Make sure you discuss any  questions you have with your health care provider. Document Released: 10/07/2005 Document Revised: 03/14/2016 Document Reviewed: 03/22/2014 Elsevier Interactive Patient Education  2018  [employer]. Iron-Rich Diet Iron is a mineral that helps your body to produce hemoglobin. Hemoglobin is a protein in your red blood cells that carries oxygen to your body's tissues. Eating too little iron may cause you to feel weak and tired, and it can increase your risk for infection. Eating enough iron is necessary for your body's metabolism, muscle function, and nervous system. Iron is naturally found in many foods. It can also be added to foods or fortified in foods. There are two types of dietary iron:  Heme iron. Heme iron is absorbed by the body more easily than nonheme iron. Heme iron is found in meat, poultry, and fish.  Nonheme iron. Nonheme iron is found in dietary supplements, iron-fortified grains, beans, and vegetables.  You may need to follow an iron-rich diet if:  You have been diagnosed with iron deficiency or iron-deficiency anemia.  You have a condition that prevents you from absorbing dietary iron, such as: ? Infection in your intestines. ? Celiac disease. This involves long-lasting (chronic) inflammation of your intestines.  You do not eat enough iron.  You eat a diet that is high in foods that impair iron absorption.  You have lost a lot of blood.  You have heavy bleeding during your menstrual cycle.  You are pregnant.  What is my plan? Your health care provider may help you to determine how much iron you need per day based on your condition. Generally, when a person consumes sufficient amounts of iron in the diet, the following iron needs are met:  Men. ? 55-54 years old: 11 mg per day. ? 81-53 years old: 8 mg per day.  Women. ? 42-9 years old: 15 mg per day. ? 71-77 years old: 18 mg per day. ? Over 53 years old: 8 mg per day. ? Pregnant women: 27 mg per day. ? Breastfeeding women: 9 mg per day.  What do I need to know about an iron-rich diet?  Eat fresh fruits and vegetables that are high in vitamin C along with foods that are high in iron. This  will help increase the amount of iron that your body absorbs from food, especially with foods containing nonheme iron. Foods that are high in vitamin C include oranges, peppers, tomatoes, and mango.  Take iron supplements only as directed by your health care provider. Overdose of iron can be life-threatening. If you were prescribed iron supplements, take them with orange juice or a vitamin C supplement.  Cook foods in pots and pans that are made from iron.  Eat nonheme iron-containing foods alongside foods that are high in heme iron. This helps to improve your iron absorption.  Certain foods and drinks contain compounds that impair iron absorption. Avoid eating these foods in the same meal as iron-rich foods or with iron supplements. These include: ? Coffee, black tea, and red wine. ? Milk, dairy products, and foods that are high in calcium. ? Beans, soybeans, and peas. ? Whole grains.  When eating foods that contain both nonheme iron and compounds that impair iron absorption, follow these tips to absorb iron better. ? Soak beans overnight before cooking. ? Soak whole grains overnight and drain them before using. ? Ferment flours before baking, such as using yeast in bread dough. What foods can I eat? Grains Iron-fortified breakfast cereal. Iron-fortified whole-wheat bread. Enriched rice. Sprouted grains. Vegetables  Spinach. Potatoes with skin. Green peas. Broccoli. Red and green bell peppers. Fermented vegetables. Fruits Prunes. Raisins. Oranges. Strawberries. Mango. Grapefruit. Meats and Other Protein Sources Beef liver. Oysters. Beef. Shrimp. [place]. Chicken. [place]. Sardines. Chickpeas. Nuts. Tofu. Beverages Tomato juice. Fresh orange juice. Prune juice. Hibiscus tea. Fortified instant breakfast shakes. Condiments Tahini. Fermented soy sauce. Sweets and Desserts Black-strap molasses. Other Wheat germ. The items listed above may not be a complete list of recommended foods or  beverages. Contact your dietitian for more options. What foods are not recommended? Grains Whole grains. Bran cereal. Bran flour. Oats. Vegetables Artichokes. Brussels sprouts. Kale. Fruits Blueberries. Raspberries. Strawberries. Figs. Meats and Other Protein Sources Soybeans. Products made from soy protein. Dairy Milk. Cream. Cheese. Yogurt. Cottage cheese. Beverages Coffee. Black tea. Red wine. Sweets and Desserts Cocoa. Chocolate. Ice cream. Other Basil. Oregano. Parsley. The items listed above may not be a complete list of foods and beverages to avoid. Contact your dietitian for more information. This information is not intended to replace advice given to you by your health care provider. Make sure you discuss any questions you have with your health care provider. Document Released: 05/21/2005 Document Revised: 04/26/2016 Document Reviewed: 05/04/2014 Elsevier Interactive Patient Education  2018 [place]. Cesarean Delivery, Care After Refer to this sheet in the next few weeks. These instructions provide you with information about caring for yourself after your procedure. Your health care provider may also give you more specific instructions. Your treatment has been planned according to current medical practices, but problems sometimes occur. Call your health care provider if you have any problems or questions after your procedure. What can I expect after the procedure? After the procedure, it is common to have:  A small amount of blood or clear fluid coming from the incision.  Some redness, swelling, and pain in your incision area.  Some abdominal pain and soreness.  Vaginal bleeding (lochia).  Pelvic cramps.  Fatigue.  Follow these instructions at home: Incision care   Follow instructions from your health care provider about how to take care of your incision. Make sure you: ? Wash your hands with soap and water before you change your bandage (dressing). If soap  and water are not available, use hand sanitizer. ? If you have a dressing, change it as told by your health care provider. ? Leave stitches (sutures), skin staples, skin glue, or adhesive strips in place. These skin closures may need to stay in place for 2 weeks or longer. If adhesive strip edges start to loosen and curl up, you may trim the loose edges. Do not remove adhesive strips completely unless your health care provider tells you to do that.  Check your incision area every day for signs of infection. Check for: ? More redness, swelling, or pain. ? More fluid or blood. ? Warmth. ? Pus or a bad smell.  When you cough or sneeze, hug a pillow. This helps with pain and decreases the chance of your incision opening up (dehiscing). Do this until your incision heals. Medicines  Take over-the-counter and prescription medicines only as told by your health care provider.  If you were prescribed an antibiotic medicine, take it as told by your health care provider. Do not stop taking the antibiotic until it is finished. Driving  Do not drive or operate heavy machinery while taking prescription pain medicine. Lifestyle  Do not drink alcohol. This is especially important if you are breastfeeding or taking pain medicine.  Do not use tobacco products,  including cigarettes, chewing tobacco, or e-cigarettes. If you need help quitting, ask your health care provider. Tobacco can delay wound healing. Eating and drinking  Drink at least 8 eight-ounce glasses of water every day unless told not to by your health care provider. If you breastfeed, you may need to drink more water than this.  Eat high-fiber foods every day. These foods may help prevent or relieve constipation. High-fiber foods include: ? Whole grain cereals and breads. ? Brown rice. ? Beans. ? Fresh fruits and vegetables. Activity  Return to your normal activities as told by your health care provider. Ask your health care provider what  activities are safe for you.  Rest as much as possible. Try to rest or take a nap while your baby is sleeping.  Do not lift anything that is heavier than your baby or 10 lb (4.5 kg) as told by your health care provider.  Ask your health care provider when you can engage in sexual activity. This may depend on your: ? Risk of infection. ? Healing rate. ? Comfort and desire to engage in sexual activity. Bathing  Do not take baths, swim, or use a hot tub until your health care provider approves. Ask your health care provider if you can take showers. You may only be allowed to take sponge baths until your incision heals. General instructions  Do not use tampons or douches until your health care provider approves.  Wear: ? Loose, comfortable clothing. ? A supportive and well-fitting bra.  Watch for any blood clots that may pass from your vagina. These may look like clumps of dark red, brown, or black discharge.  Keep your perineum clean and dry as told by your health care provider.  Wipe from front to back when you use the toilet.  If possible, have someone help you care for your baby and help with household activities for a few days after you leave the hospital.  Keep all follow-up visits for you and your baby as told by your health care provider. This is important. Contact a health care provider if:  You have: ? Bad-smelling vaginal discharge. ? Difficulty urinating. ? Pain when urinating. ? A sudden increase or decrease in the frequency of your bowel movements. ? More redness, swelling, or pain around your incision. ? More fluid or blood coming from your incision. ? Pus or a bad smell coming from your incision. ? A fever. ? A rash. ? Little or no interest in activities you used to enjoy. ? Questions about caring for yourself or your baby. ? Nausea.  Your incision feels warm to the touch.  Your breasts turn red or become painful or hard.  You feel unusually sad or  worried.  You vomit.  You pass large blood clots from your vagina. If you pass a blood clot, save it to show to your health care provider. Do not flush blood clots down the toilet without showing your health care provider.  You urinate more than usual.  You are dizzy or light-headed.  You have not breastfed and have not had a menstrual period for 12 weeks after delivery.  You stopped breastfeeding and have not had a menstrual period for 12 weeks after stopping breastfeeding. Get help right away if:  You have: ? Pain that does not go away or get better with medicine. ? Chest pain. ? Difficulty breathing. ? Blurred vision or spots in your vision. ? Thoughts about hurting yourself or your baby. ? New pain in  your abdomen or in one of your legs. ? A severe headache.  You faint.  You bleed from your vagina so much that you fill two sanitary pads in one hour. This information is not intended to replace advice given to you by your health care provider. Make sure you discuss any questions you have with your health care provider. Document Released: 06/29/2002 Document Revised: 11/09/2016 Document Reviewed: 09/11/2015 Elsevier Interactive Patient Education  [employer].

## 2018-06-08 NOTE — Progress Notes (Signed)
Upon walking into patient room, mother found breast feeding infant. Reinforced to mother that breastfeeding is not recommended per lactation department based on medical history and recent drug screen results. Mother aware, verbalized understanding.

## 2018-06-08 NOTE — Discharge Summary (Addendum)
OB Discharge Summary     Patient Name: Laurie [name] DOB: [birth year] MRN: [id number]  Date of admission: 06/05/2018 Delivering MD: Tilda BurrowFERGUSON, JOHN V   Date of discharge: 06/08/2018  Admitting diagnosis: PREGNANCY 39 WEEKS, PREVIOUS C-SECTION TIMES 2 Intrauterine pregnancy: 5161w3d     Secondary diagnosis:  Active Problems:   Polysubstance abuse (HCC)   Rh negative state in antepartum period   Gestational thrombocytopenia (HCC)   Hepatitis C   Encounter for maternal care for low transverse scar from repeat cesarean delivery   Status post repeat low transverse cesarean section  Additional problems: Anemia to 7.8 (no transfusions) stable at 8.9 on 8/19. Sending home on Fe supplementation      Discharge diagnosis: RLTCS                                                                                                Post partum procedures:rhogam  Augmentation: none  Complications: None  Hospital course:  Sceduled C/S   22 y.o. yo [id number] at 3761w3d was admitted to the hospital 06/05/2018 for scheduled cesarean section with the following indication:Elective Repeat.  Membrane Rupture Time/Date: 5:18 PM ,06/05/2018   Patient delivered a Viable infant.06/05/2018  Details of operation can be found in separate operative note.  [name] had an uncomplicated postpartum course.  She is ambulating, tolerating a regular diet, passing flatus, and urinating well. Patient is discharged home in stable condition on  06/08/18         Physical exam  Vitals:   06/07/18 0543 06/07/18 1326 06/07/18 2325 06/08/18 0540  BP: (!) 105/51 (!) 107/52 (!) 109/48 104/60  Pulse: (!) 58 63 76 65  Resp: 18 17 18 16   Temp: 97.9 F (36.6 C) (!) 97.5 F (36.4 C) 98.4 F (36.9 C) (!) 97.5 F (36.4 C)  TempSrc: Oral Oral Oral Oral  SpO2:  99% 96% 99%  Weight:      Height:       General: alert, cooperative and no distress Lochia: appropriate Uterine Fundus: firm Incision: Dressing is clean, dry, and intact DVT  Evaluation: No evidence of DVT seen on physical exam. Labs: Lab Results  Component Value Date   WBC 8.7 06/06/2018   HGB 7.9 (L) 06/08/2018   HCT 25.7 (L) 06/08/2018   MCV 84.5 06/06/2018   PLT 96 (L) 06/06/2018   CMP Latest Ref Rng & Units 06/06/2018  Glucose 70 - 99 mg/dL -  BUN 6 - 20 mg/dL -  Creatinine 1.300.44 - 8.651.00 mg/dL 7.840.63  Sodium 696135 - 295145 mmol/L -  Potassium 3.5 - 5.1 mmol/L -  Chloride 98 - 111 mmol/L -  CO2 22 - 32 mmol/L -  Calcium 8.9 - 10.3 mg/dL -  Total Protein 6.5 - 8.1 g/dL -  Total Bilirubin 0.3 - 1.2 mg/dL -  Alkaline Phos 38 - 284126 U/L -  AST 15 - 41 U/L -  ALT 0 - 44 U/L -    Discharge instruction: per After Visit Summary and "Baby and Me Booklet".  After visit meds:  Allergies as of 06/08/2018  Reactions   Penicillins Other (See Comments)   Results thru an allergy skin test Has patient had a PCN reaction causing immediate rash, facial/tongue/throat swelling, SOB or lightheadedness with hypotension: No Has patient had a PCN reaction causing severe rash involving mucus membranes or skin necrosis: No Has patient had a PCN reaction that required hospitalization No Has patient had a PCN reaction occurring within the last 10 years: No If all of the above answers are "NO", then may proceed with Cephalosporin use.      Medication List    STOP taking these medications   PRENATAL COMPLETE 14-0.4 MG Tabs     TAKE these medications   ferrous sulfate 325 (65 FE) MG tablet Take 1 tablet (325 mg total) by mouth 2 (two) times daily with a meal.   ibuprofen 600 MG tablet Commonly known as:  ADVIL,MOTRIN Take 1 tablet (600 mg total) by mouth every 6 (six) hours.   nicotine 14 mg/24hr patch Commonly known as:  NICODERM CQ - dosed in mg/24 hours Place 1 patch (14 mg total) onto the skin daily.   prenatal multivitamin Tabs tablet Take 1 tablet by mouth daily at 12 noon.       Diet: routine diet  Activity: Advance as tolerated. Pelvic rest for 6  weeks.   Outpatient follow up:2 weeks Follow up Appt:No future appointments. Follow up Visit:No follow-ups on file.  Postpartum contraception: Condoms and Future BTL  Newborn Data: Live born female  Birth Weight: 6 lb 11.8 oz (3055 g) APGAR: , 9  Newborn Delivery   Birth date/time:  06/05/2018 17:19:00 Delivery type:  C-Section, Low Transverse Trial of labor:  No C-section categorization:  Repeat     Baby Feeding: Bottle and Breast Disposition:home with mother CPS involved and determined that baby can go home with mom.   06/08/2018 Mirian MoPeter Frank, MD   OB FELLOW DISCHARGE ATTESTATION  I have seen and examined this patient and agree with above documentation in the resident's note.   Marcy Sirenatherine Deatra Mcmahen, D.O. OB Fellow  06/08/2018, 11:47 AM

## 2018-06-08 NOTE — Telephone Encounter (Signed)
Lmom for pt to call us back to set up pp appointment.  06-08-18  AS

## 2018-06-09 LAB — TYPE AND SCREEN
ABO/RH(D): A NEG
Antibody Screen: POSITIVE
UNIT DIVISION: 0
UNIT DIVISION: 0

## 2018-06-09 LAB — BPAM RBC
Blood Product Expiration Date: 201909122359
Blood Product Expiration Date: 201909122359
UNIT TYPE AND RH: 600
UNIT TYPE AND RH: 600

## 2018-06-17 ENCOUNTER — Encounter (HOSPITAL_COMMUNITY): Payer: Self-pay | Admitting: *Deleted

## 2019-02-05 DIAGNOSIS — N3091 Cystitis, unspecified with hematuria: Secondary | ICD-10-CM | POA: Diagnosis not present

## 2019-02-05 DIAGNOSIS — M62838 Other muscle spasm: Secondary | ICD-10-CM | POA: Diagnosis not present

## 2019-02-18 ENCOUNTER — Encounter: Payer: Self-pay | Admitting: *Deleted

## 2019-03-11 DIAGNOSIS — N3090 Cystitis, unspecified without hematuria: Secondary | ICD-10-CM | POA: Diagnosis not present

## 2019-03-11 DIAGNOSIS — L509 Urticaria, unspecified: Secondary | ICD-10-CM | POA: Diagnosis not present

## 2019-07-09 ENCOUNTER — Encounter (HOSPITAL_COMMUNITY): Payer: Self-pay | Admitting: Emergency Medicine

## 2019-07-09 ENCOUNTER — Other Ambulatory Visit: Payer: Self-pay

## 2019-07-09 ENCOUNTER — Emergency Department (HOSPITAL_COMMUNITY)
Admission: EM | Admit: 2019-07-09 | Discharge: 2019-07-09 | Disposition: A | Discharge status: Home / Self Care | Payer: BLUE CROSS/BLUE SHIELD | Attending: Emergency Medicine | Admitting: Emergency Medicine

## 2019-07-09 DIAGNOSIS — O9989 Other specified diseases and conditions complicating pregnancy, childbirth and the puerperium: Secondary | ICD-10-CM | POA: Insufficient documentation

## 2019-07-09 DIAGNOSIS — Z3A01 Less than 8 weeks gestation of pregnancy: Secondary | ICD-10-CM | POA: Insufficient documentation

## 2019-07-09 DIAGNOSIS — Z881 Allergy status to other antibiotic agents status: Secondary | ICD-10-CM | POA: Diagnosis not present

## 2019-07-09 DIAGNOSIS — R300 Dysuria: Secondary | ICD-10-CM | POA: Insufficient documentation

## 2019-07-09 DIAGNOSIS — Z880 Allergy status to penicillin: Secondary | ICD-10-CM | POA: Insufficient documentation

## 2019-07-09 DIAGNOSIS — F17210 Nicotine dependence, cigarettes, uncomplicated: Secondary | ICD-10-CM | POA: Insufficient documentation

## 2019-07-09 LAB — URINALYSIS, ROUTINE W REFLEX MICROSCOPIC
Bilirubin Urine: NEGATIVE
Glucose, UA: NEGATIVE mg/dL
Hgb urine dipstick: NEGATIVE
Ketones, ur: NEGATIVE mg/dL
Nitrite: NEGATIVE
Protein, ur: NEGATIVE mg/dL
Specific Gravity, Urine: 1.012 (ref 1.005–1.030)
WBC, UA: >50 WBC/hpf — ABNORMAL HIGH (ref 0–5)
pH: 7 (ref 5.0–8.0)

## 2019-07-09 LAB — POC URINE PREG, ED: Preg Test, Ur: POSITIVE — AB

## 2019-07-09 MED ORDER — PRENATAL VITAMIN 27-0.8 MG PO TABS: 1.0000 | ORAL_TABLET | Freq: Every day | ORAL | 0 refills | Status: DC

## 2019-07-09 NOTE — ED Triage Notes (Signed)
Patient complaining of pressure and pain with urination x 3 days. Also states she is two weeks late on her menstrual cycle.

## 2019-07-09 NOTE — ED Notes (Signed)
Patient not in room, left without signing out.

## 2019-07-09 NOTE — ED Notes (Signed)
Discharge papers available for patient, not in room.

## 2019-07-09 NOTE — ED Notes (Signed)
No answer

## 2019-07-09 NOTE — Discharge Instructions (Addendum)
Start the prenatal vitamin.  Your urine test does not show a clear urinary infection so a culture has been ordered.  If this shows an infection, you will be contacted to start an antibiotic.  Plan to call Dr. Glo Herring to establish prenatal care.

## 2019-07-10 NOTE — ED Provider Notes (Signed)
[facility] EMERGENCY DEPARTMENT Provider Note   CSN: [id number] Arrival date & time: 07/09/19  1438     History   Chief Complaint Chief Complaint  Patient presents with  . Dysuria    HPI Laurie [name] is a 23 y.o. female with a history of anxiety, depression, Hep C, h/o substance abuse, presenting with concerns of increased urinary frequency with a suprapubic pressure sensation present with urination.  Additionally she is 2 weeks late with her menstrual cycle, last menses occurred the first week of August, unsure of date as she doesn't track it closely.  She does have concern for possible pregnancy. She denies fevers, chills, n/v, breast tenderness, has had no hematuria, no back pain, denies abdominal pain and has no vaginal bleeding or discharge.  She is currently G4P3, currently 13 months post partum.  She is not breast feeding.  She has had no medicines prior to evaluation for her symptoms.     The history is provided by the patient.    Past Medical History:  Diagnosis Date  . Anxiety   . Asthma    "grew out"  . Chlamydia   . Depression    doing really good  . Gestational diabetes    with first two pregnancies  . Hepatitis C June 2016  . History of heroin abuse ([place])   . Infection    UTI  . Polysubstance abuse ([place]) 05/28/2018   UDS positive for cocaine, MJ, meth, opioids  . Type A blood, Rh negative     Patient Active Problem List   Diagnosis Date Noted  . Encounter for maternal care for low transverse scar from repeat cesarean delivery 06/05/2018  . Status post repeat low transverse cesarean section 06/05/2018  . Indication for care in labor and delivery, antepartum 05/28/2018  . Supervision of normal pregnancy 02/10/2018  . Gestational thrombocytopenia ([place]) 12/20/2016  . Tobacco abuse 11/25/2016  . Rh negative state in antepartum period 11/25/2016  . Polysubstance abuse ([place]) 08/31/2015  . Hepatitis C 03/22/2015    Past Surgical History:  Procedure  Laterality Date  . CESAREAN SECTION N/A 01/11/2016   Procedure: CESAREAN SECTION;  Surgeon: Shelly Bombard, MD;  Location: [facility];  Service: Obstetrics;  Laterality: N/A;  . CESAREAN SECTION N/A 12/21/2016   Procedure: REPEAT CESAREAN SECTION;  Surgeon: Woodroe Mode, MD;  Location: [facility];  Service: Obstetrics;  Laterality: N/A;  . CESAREAN SECTION N/A 06/05/2018   Procedure: REPEAT CESAREAN SECTION;  Surgeon: Jonnie Kind, MD;  Location: [facility];  Service: Obstetrics;  Laterality: N/A;     OB History    Gravida  4   Para  3   Term  2   Preterm  1   AB  1   Living  3     SAB  1   TAB  0   Ectopic  0   Multiple  0   Live Births  3            Home Medications    Prior to Admission medications   Medication Sig Start Date End Date Taking? Authorizing Provider  ferrous sulfate 325 (65 FE) MG tablet Take 1 tablet (325 mg total) by mouth 2 (two) times daily with a meal. 06/08/18   Matilde Haymaker, MD  ibuprofen (ADVIL,MOTRIN) 600 MG tablet Take 1 tablet (600 mg total) by mouth every 6 (six) hours. 06/08/18   Matilde Haymaker, MD  nicotine (NICODERM CQ - DOSED IN MG/24  HOURS) 14 mg/24hr patch Place 1 patch (14 mg total) onto the skin daily. 06/08/18   Mirian Mo, MD  Prenatal Vit-Fe Fumarate-FA (PRENATAL MULTIVITAMIN) TABS tablet Take 1 tablet by mouth daily at 12 noon. 06/08/18   Mirian Mo, MD  Prenatal Vit-Fe Fumarate-FA (PRENATAL VITAMIN) 27-0.8 MG TABS Take 1 tablet by mouth daily. 07/09/19   Burgess Amor, PA-C    Family History Family History  Problem Relation Age of Onset  . Diabetes Mother   . Pulmonary embolism Mother   . Stroke Mother   . Cancer Paternal Grandmother        liver  . Epilepsy Brother     Social History Social History   Tobacco Use  . Smoking status: Current Some Day Smoker    Packs/day: 0.25    Types: Cigarettes  . Smokeless tobacco: Never Used  . Tobacco comment: 4 cigs per day  Substance Use Topics  . Alcohol  use: Yes    Comment: occasionally  . Drug use: Yes    Types: Marijuana, Heroin, Other-see comments    Comment: 05/28/18 +UDS for meth, cocaine, opioids, MJ;  heroin 3 years ago     Allergies   Keflex [cephalexin] and Penicillins   Review of Systems Review of Systems  Constitutional: Negative for chills and fever.  HENT: Negative for congestion and sore throat.   Eyes: Negative.   Respiratory: Negative for chest tightness and shortness of breath.   Cardiovascular: Negative for chest pain.  Gastrointestinal: Negative for abdominal pain, nausea and vomiting.  Genitourinary: Positive for frequency and menstrual problem. Negative for pelvic pain, vaginal bleeding, vaginal discharge and vaginal pain.  Musculoskeletal: Negative for arthralgias, back pain, joint swelling and neck pain.  Skin: Negative.  Negative for rash and wound.  Neurological: Negative for dizziness, weakness, light-headedness, numbness and headaches.  Psychiatric/Behavioral: Negative.      Physical Exam Updated Vital Signs BP (!) 111/52 (BP Location: Left Arm)   Pulse 65   Temp 98.2 F (36.8 C) (Oral)   Resp 16   Ht 5\' 3"  (1.6 m)   Wt 57.6 kg   LMP 05/26/2019   SpO2 100%   BMI 22.50 kg/m   Physical Exam Vitals signs and nursing note reviewed.  Constitutional:      Appearance: She is well-developed.  HENT:     Head: Normocephalic and atraumatic.  Neck:     Musculoskeletal: Normal range of motion.  Cardiovascular:     Rate and Rhythm: Normal rate and regular rhythm.     Heart sounds: Normal heart sounds.  Pulmonary:     Effort: Pulmonary effort is normal.     Breath sounds: Normal breath sounds. No wheezing.  Abdominal:     General: Bowel sounds are normal. There is no distension.     Palpations: Abdomen is soft.     Tenderness: There is no abdominal tenderness. There is no guarding or rebound.  Musculoskeletal: Normal range of motion.  Skin:    General: Skin is warm and dry.  Neurological:      Mental Status: She is alert.      ED Treatments / Results  Labs (all labs ordered are listed, but only abnormal results are displayed) Labs Reviewed  URINALYSIS, ROUTINE W REFLEX MICROSCOPIC - Abnormal; Notable for the following components:      Result Value   APPearance HAZY (*)    Leukocytes,Ua MODERATE (*)    WBC, UA >50 (*)    Bacteria, UA RARE (*)  All other components within normal limits  POC URINE PREG, ED - Abnormal; Notable for the following components:   Preg Test, Ur POSITIVE (*)    All other components within normal limits  URINE CULTURE    EKG None  Radiology No results found.  Procedures Procedures (including critical care time)  Medications Ordered in ED Medications - No data to display   Initial Impression / Assessment and Plan / ED Course  I have reviewed the triage vital signs and the nursing notes.  Pertinent labs & imaging results that were available during my care of the patient were reviewed by me and considered in my medical decision making (see chart for details).        Pt with positive pregnancy test, LMP early 8/20, suspected less than [redacted] weeks gestation.  No uti per dip, culture sent.  Pt was advised to f/u with hr gynecologist to establish prenatal care (Dr. Emelda FearFerguson).  Prenatal vitamin prescribed.  Azo safe in pregnancy, advised can take for sx relief.    Final Clinical Impressions(s) / ED Diagnoses   Final diagnoses:  Less than [redacted] weeks gestation of pregnancy    ED Discharge Orders         Ordered    Prenatal Vit-Fe Fumarate-FA (PRENATAL VITAMIN) 27-0.8 MG TABS  Daily     07/09/19 1653           Burgess Amordol, Jaquita Bessire, PA-C 07/10/19 1842    Samuel JesterMcManus, Kathleen, DO 07/11/19 1625

## 2019-07-12 LAB — URINE CULTURE: Culture: >=100000 — AB

## 2019-07-13 ENCOUNTER — Telehealth (HOSPITAL_BASED_OUTPATIENT_CLINIC_OR_DEPARTMENT_OTHER): Payer: Self-pay

## 2019-07-13 NOTE — Progress Notes (Signed)
ED Antimicrobial Stewardship Positive Culture Follow Up   Laurie [name] is an 23 y.o. female who presented to [facility] on 07/09/2019 with a chief complaint of  Chief Complaint  Patient presents with  . Dysuria    Recent Results (from the past 720 hour(s))  Urine Culture     Status: Abnormal   Collection Time: 07/09/19  3:42 PM   Specimen: Urine, Clean Catch  Result Value Ref Range Status   Specimen Description   Final    URINE, CLEAN CATCH Performed at [facility], [address], [place], [place] [address]    Special Requests   Final    NONE Performed at [facility], [address] [place], [place] [address]    Culture >=100,000 COLONIES/mL ESCHERICHIA COLI (A)  Final   Report Status 07/12/2019 FINAL  Final   Organism ID, Bacteria ESCHERICHIA COLI (A)  Final      Susceptibility   Escherichia coli - MIC*    AMPICILLIN <=2 SENSITIVE Sensitive     CEFAZOLIN <=4 SENSITIVE Sensitive     CEFTRIAXONE <=1 SENSITIVE Sensitive     CIPROFLOXACIN <=0.25 SENSITIVE Sensitive     GENTAMICIN <=1 SENSITIVE Sensitive     IMIPENEM <=0.25 SENSITIVE Sensitive     NITROFURANTOIN <=16 SENSITIVE Sensitive     TRIMETH/SULFA <=20 SENSITIVE Sensitive     AMPICILLIN/SULBACTAM <=2 SENSITIVE Sensitive     PIP/TAZO <=4 SENSITIVE Sensitive     Extended ESBL NEGATIVE Sensitive     * >=100,000 COLONIES/mL ESCHERICHIA COLI    [x]  Patient discharged originally without antimicrobial agent and treatment is now indicated  New antibiotic prescription: Start fosfomycin 3g PO x 1 dose.  ED Provider: Gerlene Fee, MD   Elicia Lamp P 07/13/2019, 11:07 AM Clinical Pharmacist Monday - Friday phone -  [phone] Saturday - Sunday phone - [phone]

## 2019-07-13 NOTE — Telephone Encounter (Signed)
Post ED Visit - Positive Culture Follow-up: Chart Hand-off to ED Flow Manager  Culture assessed and recommendations reviewed by: []  Levester Fresh, Pharm.D., BCPS []  Heide Guile, Pharm.D., BCPS-AQ ID []  Alycia Rossetti, Pharm.D., BCPS []  [place], Pharm.D., BCPS, AAHIVP []  Legrand Como, Pharm .D., BCPS, AAHIVP []  Cassie Stewart, Pharm.D. []  Dimitri Ped, Pharm.D. Stacy Gardner.D.  Positive urine culture, >/= 100,000 colonies/ml E Coli  [x]  Patient discharged without antimicrobial prescription and treatment is now indicated []  Organism is resistant to prescribed ED discharge antimicrobial []  Patient with positive blood cultures  Changes discussed with ED provider: Dr Sedonia Small New antibiotic prescription "Start fosfomycin 3g PO x 1 dose"   07/13/19 @ 1240 LVM requesting callback.  Dortha Kern 07/13/2019, 12:42 PM

## 2019-07-13 NOTE — Telephone Encounter (Signed)
Letter sent to Epic address, regarding (+) culture report.

## 2019-08-31 ENCOUNTER — Telehealth: Payer: Self-pay | Admitting: Adult Health

## 2019-08-31 NOTE — Telephone Encounter (Signed)

## 2019-09-07 ENCOUNTER — Telehealth: Payer: Self-pay | Admitting: Obstetrics & Gynecology

## 2019-09-07 NOTE — Telephone Encounter (Signed)

## 2019-11-12 ENCOUNTER — Telehealth: Payer: Self-pay | Admitting: Obstetrics & Gynecology

## 2019-11-12 NOTE — Telephone Encounter (Signed)
Patient called, stated that she is 6 months pregnant.  Stated that she went to [facility] in September and they confirmed she was pregnant.  She has not had any treatment since that ER visit.  Please advise.  [phone] or [phone]

## 2019-11-23 ENCOUNTER — Emergency Department (HOSPITAL_COMMUNITY)
Admission: EM | Admit: 2019-11-23 | Discharge: 2019-11-23 | Disposition: A | Discharge status: Home / Self Care | Payer: BLUE CROSS/BLUE SHIELD | Attending: Emergency Medicine | Admitting: Emergency Medicine

## 2019-11-23 ENCOUNTER — Encounter (HOSPITAL_COMMUNITY): Payer: Self-pay

## 2019-11-23 ENCOUNTER — Other Ambulatory Visit: Payer: Self-pay

## 2019-11-23 DIAGNOSIS — O26899 Other specified pregnancy related conditions, unspecified trimester: Secondary | ICD-10-CM

## 2019-11-23 DIAGNOSIS — O26892 Other specified pregnancy related conditions, second trimester: Secondary | ICD-10-CM | POA: Insufficient documentation

## 2019-11-23 DIAGNOSIS — R8271 Bacteriuria: Secondary | ICD-10-CM

## 2019-11-23 DIAGNOSIS — Z79899 Other long term (current) drug therapy: Secondary | ICD-10-CM | POA: Insufficient documentation

## 2019-11-23 DIAGNOSIS — O99512 Diseases of the respiratory system complicating pregnancy, second trimester: Secondary | ICD-10-CM | POA: Diagnosis not present

## 2019-11-23 DIAGNOSIS — O99332 Smoking (tobacco) complicating pregnancy, second trimester: Secondary | ICD-10-CM | POA: Diagnosis not present

## 2019-11-23 DIAGNOSIS — O99891 Other specified diseases and conditions complicating pregnancy: Secondary | ICD-10-CM

## 2019-11-23 DIAGNOSIS — O4692 Antepartum hemorrhage, unspecified, second trimester: Secondary | ICD-10-CM | POA: Diagnosis not present

## 2019-11-23 DIAGNOSIS — O2392 Unspecified genitourinary tract infection in pregnancy, second trimester: Secondary | ICD-10-CM | POA: Diagnosis not present

## 2019-11-23 DIAGNOSIS — F17210 Nicotine dependence, cigarettes, uncomplicated: Secondary | ICD-10-CM | POA: Diagnosis not present

## 2019-11-23 DIAGNOSIS — R109 Unspecified abdominal pain: Secondary | ICD-10-CM | POA: Insufficient documentation

## 2019-11-23 DIAGNOSIS — J45909 Unspecified asthma, uncomplicated: Secondary | ICD-10-CM | POA: Diagnosis not present

## 2019-11-23 DIAGNOSIS — B9689 Other specified bacterial agents as the cause of diseases classified elsewhere: Secondary | ICD-10-CM | POA: Diagnosis not present

## 2019-11-23 LAB — URINALYSIS, ROUTINE W REFLEX MICROSCOPIC
Bilirubin Urine: NEGATIVE
Glucose, UA: NEGATIVE mg/dL
Hgb urine dipstick: NEGATIVE
Ketones, ur: NEGATIVE mg/dL
Nitrite: NEGATIVE
Protein, ur: NEGATIVE mg/dL
Specific Gravity, Urine: 1.01 (ref 1.005–1.030)
pH: 7 (ref 5.0–8.0)

## 2019-11-23 LAB — AMNISURE RUPTURE OF MEMBRANE (ROM) NOT AT ARMC: Amnisure ROM: NEGATIVE

## 2019-11-23 MED ORDER — NITROFURANTOIN MONOHYD MACRO 100 MG PO CAPS: 100.0000 mg | ORAL_CAPSULE | Freq: Two times a day (BID) | ORAL | 0 refills | Status: DC

## 2019-11-23 NOTE — ED Notes (Signed)
Rapid OB called by Geraldine Contras, NS

## 2019-11-23 NOTE — ED Notes (Addendum)
Pt states she thinks water broke about an hour ago but thinks it have been leaking for a week now. Feels like she is having contractions approx 64m. Unknown [name], LMP in July. Does not have urge to push, intermittent vaginal bleeding.

## 2019-11-23 NOTE — Progress Notes (Signed)
Pt. To [facility] for possible ROM x2 days-reports big gush today that brought her in. Pt. Also states spotting bright red blood x1week. Pt. Hx of C/S x3, preterm premature ROM and preterm delivery. Pt. Denies having any prenatal care and was last seen in ED at 8 weeks. [name] Mar 01, 2020. Pt. Denies ctx, and states good fetal movement. Monitor applied. OB provider notified.

## 2019-11-23 NOTE — ED Notes (Signed)
Patient verbalizes understanding of discharge instructions. Opportunity for questioning and answers were provided.  pt discharged from ED, ambulatory by Jake   

## 2019-11-23 NOTE — Progress Notes (Signed)
Pt. Cervix closed, no blood or fluid noted upon sterile vaginal exam. Amnisure negative. ED provider and OB provider notified of lab results and tracing. OB cleared patient to be removed from monitor at this time. Pt. Educated to follow up with OB as soon as possible.

## 2019-11-23 NOTE — ED Provider Notes (Signed)
[facility] EMERGENCY DEPARTMENT Provider Note   CSN: [id number] Arrival date & time: 11/23/19  1922     History Chief Complaint  Patient presents with  . SPROM    Laurie [name] is a 24 y.o. female.  HPI   24 year old female presenting over concern that her water broke.  She is unsure of her exact dates but approximately 26 weeks based on her best recollection.  She has yet to establish OB care with this pregnancy.  She reports the last week she has had intermittent "small gushes of fluid" per vagina.  Today she began having some right-sided abdominal pain and felt like she was losing a larger quantity of fluid.  Pain is intermittent.  No appreciable exacerbating or relieving factors.    Past Medical History:  Diagnosis Date  . Anxiety   . Asthma    "grew out"  . Chlamydia   . Depression    doing really good  . Gestational diabetes    with first two pregnancies  . Hepatitis C June 2016  . History of heroin abuse ([place])   . Infection    UTI  . Polysubstance abuse ([place]) 05/28/2018   UDS positive for cocaine, MJ, meth, opioids  . Type A blood, Rh negative     Patient Active Problem List   Diagnosis Date Noted  . Encounter for maternal care for low transverse scar from repeat cesarean delivery 06/05/2018  . Status post repeat low transverse cesarean section 06/05/2018  . Indication for care in labor and delivery, antepartum 05/28/2018  . Supervision of normal pregnancy 02/10/2018  . Gestational thrombocytopenia ([place]) 12/20/2016  . Tobacco abuse 11/25/2016  . Rh negative state in antepartum period 11/25/2016  . Polysubstance abuse ([place]) 08/31/2015  . Hepatitis C 03/22/2015    Past Surgical History:  Procedure Laterality Date  . CESAREAN SECTION N/A 01/11/2016   Procedure: CESAREAN SECTION;  Surgeon: Shelly Bombard, MD;  Location: [facility];  Service: Obstetrics;  Laterality: N/A;  . CESAREAN SECTION N/A 12/21/2016   Procedure: REPEAT CESAREAN SECTION;   Surgeon: [place] Mode, MD;  Location: [facility];  Service: Obstetrics;  Laterality: N/A;  . CESAREAN SECTION N/A 06/05/2018   Procedure: REPEAT CESAREAN SECTION;  Surgeon: Jonnie Kind, MD;  Location: [facility];  Service: Obstetrics;  Laterality: N/A;     OB History    Gravida  5   Para  3   Term  2   Preterm  1   AB  1   Living  3     SAB  1   TAB  0   Ectopic  0   Multiple  0   Live Births  3           Family History  Problem Relation Age of Onset  . Diabetes Mother   . Pulmonary embolism Mother   . Stroke Mother   . Cancer Paternal Grandmother        liver  . Epilepsy Brother     Social History   Tobacco Use  . Smoking status: Current Some Day Smoker    Packs/day: 0.25    Types: Cigarettes  . Smokeless tobacco: Never Used  . Tobacco comment: 4 cigs per day  Substance Use Topics  . Alcohol use: Yes    Comment: occasionally  . Drug use: Yes    Types: Marijuana, Heroin, Other-see comments    Comment: 05/28/18 +UDS for meth, cocaine, opioids, MJ;  heroin 3 years ago    Home Medications Prior to Admission medications   Medication Sig Start Date End Date Taking? Authorizing Provider  ferrous sulfate 325 (65 FE) MG tablet Take 1 tablet (325 mg total) by mouth 2 (two) times daily with a meal. 06/08/18   Mirian Mo, MD  ibuprofen (ADVIL,MOTRIN) 600 MG tablet Take 1 tablet (600 mg total) by mouth every 6 (six) hours. 06/08/18   Mirian Mo, MD  nicotine (NICODERM CQ - DOSED IN MG/24 HOURS) 14 mg/24hr patch Place 1 patch (14 mg total) onto the skin daily. 06/08/18   Mirian Mo, MD  nitrofurantoin, macrocrystal-monohydrate, (MACROBID) 100 MG capsule Take 1 capsule (100 mg total) by mouth 2 (two) times daily. 11/23/19   Raeford Razor, MD  Prenatal Vit-Fe Fumarate-FA (PRENATAL MULTIVITAMIN) TABS tablet Take 1 tablet by mouth daily at 12 noon. 06/08/18   Mirian Mo, MD  Prenatal Vit-Fe Fumarate-FA (PRENATAL VITAMIN) 27-0.8 MG TABS Take 1  tablet by mouth daily. 07/09/19   Burgess Amor, PA-C    Allergies    Keflex [cephalexin] and Penicillins  Review of Systems   Review of Systems All systems reviewed and negative, other than as noted in HPI. Physical Exam Updated Vital Signs BP 116/80   Pulse 83   Resp (!) 21   LMP 05/26/2019   SpO2 100%   Physical Exam Vitals and nursing note reviewed.  Constitutional:      General: She is not in acute distress.    Appearance: She is well-developed.  HENT:     Head: Normocephalic and atraumatic.  Eyes:     General:        Right eye: No discharge.        Left eye: No discharge.     Conjunctiva/sclera: Conjunctivae normal.  Cardiovascular:     Rate and Rhythm: Normal rate and regular rhythm.     Heart sounds: Normal heart sounds. No murmur. No friction rub. No gallop.   Pulmonary:     Effort: Pulmonary effort is normal. No respiratory distress.     Breath sounds: Normal breath sounds.  Abdominal:     General: There is no distension.     Palpations: Abdomen is soft.     Tenderness: There is no abdominal tenderness.     Comments: Gravid uterus.  Fundus around the height of the umbilicus.  Nontender.  Musculoskeletal:        General: No tenderness.     Cervical back: Neck supple.  Skin:    General: Skin is warm and dry.  Neurological:     Mental Status: She is alert.  Psychiatric:        Behavior: Behavior normal.        Thought Content: Thought content normal.     ED Results / Procedures / Treatments   Labs (all labs ordered are listed, but only abnormal results are displayed) Labs Reviewed  URINALYSIS, ROUTINE W REFLEX MICROSCOPIC - Abnormal; Notable for the following components:      Result Value   Leukocytes,Ua MODERATE (*)    Bacteria, UA RARE (*)    All other components within normal limits  URINE CULTURE  AMNISURE RUPTURE OF MEMBRANE (ROM) NOT AT [facility]    EKG None  Radiology No results found.  Procedures Procedures (including critical care  time)  Medications Ordered in ED Medications - No data to display  ED Course  I have reviewed the triage vital signs and the nursing notes.  Pertinent labs & imaging  results that were available during my care of the patient were reviewed by me and considered in my medical decision making (see chart for details).    MDM Rules/Calculators/A&P                      24 year old female with concern for possible rupture membranes at approximately [redacted] weeks gestation.  In her sure was negative.  Cervix checked by [facility] nursing and reportedly closed.  Fetal monitoring reassuring.  Some intermittent right-sided abdominal pain with a benign exam.  Bacteria noted.  Will treat given pregnancy.  Needs to follow-up with OB.  Return precautions discussed.  Final Clinical Impression(s) / ED Diagnoses Final diagnoses:  Abdominal pain affecting pregnancy  Bacteriuria during pregnancy    Rx / DC Orders ED Discharge Orders         Ordered    nitrofurantoin, macrocrystal-monohydrate, (MACROBID) 100 MG capsule  2 times daily     11/23/19 2107           Raeford Razor, MD 11/23/19 2112

## 2019-11-24 LAB — URINE CULTURE: Culture: <10000 — AB

## 2019-12-16 ENCOUNTER — Other Ambulatory Visit: Payer: Self-pay | Admitting: Obstetrics and Gynecology

## 2019-12-16 ENCOUNTER — Telehealth: Payer: Self-pay | Admitting: Women's Health

## 2019-12-16 DIAGNOSIS — Z363 Encounter for antenatal screening for malformations: Secondary | ICD-10-CM

## 2019-12-16 NOTE — Telephone Encounter (Signed)
Tried to reach the patient to remind her of her appointment/restrictions, call can not be completed at this time. °

## 2019-12-20 ENCOUNTER — Other Ambulatory Visit: Payer: Self-pay

## 2019-12-20 ENCOUNTER — Ambulatory Visit (INDEPENDENT_AMBULATORY_CARE_PROVIDER_SITE_OTHER): Payer: BLUE CROSS/BLUE SHIELD

## 2019-12-20 ENCOUNTER — Other Ambulatory Visit: Payer: BLUE CROSS/BLUE SHIELD

## 2019-12-20 ENCOUNTER — Ambulatory Visit: Payer: BLUE CROSS/BLUE SHIELD | Admitting: *Deleted

## 2019-12-20 ENCOUNTER — Ambulatory Visit: Payer: BLUE CROSS/BLUE SHIELD | Admitting: Women's Health

## 2019-12-20 VITALS — BP 113/59 | HR 83 | Wt 147.2 lb

## 2019-12-20 DIAGNOSIS — Z3480 Encounter for supervision of other normal pregnancy, unspecified trimester: Secondary | ICD-10-CM | POA: Insufficient documentation

## 2019-12-20 DIAGNOSIS — Z363 Encounter for antenatal screening for malformations: Secondary | ICD-10-CM

## 2019-12-20 DIAGNOSIS — Z3A29 29 weeks gestation of pregnancy: Secondary | ICD-10-CM

## 2019-12-20 DIAGNOSIS — O0933 Supervision of pregnancy with insufficient antenatal care, third trimester: Secondary | ICD-10-CM | POA: Insufficient documentation

## 2019-12-20 LAB — POCT URINALYSIS DIPSTICK OB
Blood, UA: NEGATIVE
Glucose, UA: NEGATIVE
Ketones, UA: NEGATIVE
Leukocytes, UA: NEGATIVE
Nitrite, UA: NEGATIVE
POC,PROTEIN,UA: NEGATIVE

## 2019-12-20 NOTE — Progress Notes (Addendum)
[place] 29+5 wks,cephalic,fundal placenta gr 0,normal ovaries bilat,fhr 142 bpm,svp of fluid 5.7 cm,efw 1359 G 23%,anatomy complete,no obvious abnormalities,[name] 03/01/2020 by LMP

## 2019-12-21 NOTE — Progress Notes (Signed)
Pt left without being seen by provider.  Cheral Marker, CNM, [facility] 12/20/2019

## 2020-03-07 ENCOUNTER — Other Ambulatory Visit: Payer: Self-pay

## 2020-03-07 ENCOUNTER — Encounter (HOSPITAL_COMMUNITY): Payer: Self-pay | Admitting: Family Medicine

## 2020-03-07 ENCOUNTER — Inpatient Hospital Stay (HOSPITAL_COMMUNITY)
Admission: AD | Admit: 2020-03-07 | Discharge: 2020-03-10 | DRG: 784 | Disposition: A | Discharge status: Home / Self Care | Payer: BLUE CROSS/BLUE SHIELD | Attending: Family Medicine | Admitting: Family Medicine

## 2020-03-07 DIAGNOSIS — O9842 Viral hepatitis complicating childbirth: Secondary | ICD-10-CM | POA: Diagnosis present

## 2020-03-07 DIAGNOSIS — Z6791 Unspecified blood type, Rh negative: Secondary | ICD-10-CM

## 2020-03-07 DIAGNOSIS — D6959 Other secondary thrombocytopenia: Secondary | ICD-10-CM | POA: Diagnosis present

## 2020-03-07 DIAGNOSIS — O26893 Other specified pregnancy related conditions, third trimester: Secondary | ICD-10-CM | POA: Diagnosis present

## 2020-03-07 DIAGNOSIS — Z20822 Contact with and (suspected) exposure to covid-19: Secondary | ICD-10-CM | POA: Diagnosis present

## 2020-03-07 DIAGNOSIS — Z3A41 41 weeks gestation of pregnancy: Secondary | ICD-10-CM

## 2020-03-07 DIAGNOSIS — O0933 Supervision of pregnancy with insufficient antenatal care, third trimester: Secondary | ICD-10-CM

## 2020-03-07 DIAGNOSIS — Z720 Tobacco use: Secondary | ICD-10-CM | POA: Diagnosis present

## 2020-03-07 DIAGNOSIS — Z302 Encounter for sterilization: Secondary | ICD-10-CM

## 2020-03-07 DIAGNOSIS — O99334 Smoking (tobacco) complicating childbirth: Secondary | ICD-10-CM | POA: Diagnosis present

## 2020-03-07 DIAGNOSIS — O26899 Other specified pregnancy related conditions, unspecified trimester: Secondary | ICD-10-CM

## 2020-03-07 DIAGNOSIS — O9912 Other diseases of the blood and blood-forming organs and certain disorders involving the immune mechanism complicating childbirth: Secondary | ICD-10-CM | POA: Diagnosis present

## 2020-03-07 DIAGNOSIS — B1920 Unspecified viral hepatitis C without hepatic coma: Secondary | ICD-10-CM | POA: Diagnosis present

## 2020-03-07 DIAGNOSIS — Z880 Allergy status to penicillin: Secondary | ICD-10-CM

## 2020-03-07 DIAGNOSIS — Z98891 History of uterine scar from previous surgery: Secondary | ICD-10-CM

## 2020-03-07 DIAGNOSIS — O480 Post-term pregnancy: Principal | ICD-10-CM | POA: Diagnosis present

## 2020-03-07 DIAGNOSIS — O34211 Maternal care for low transverse scar from previous cesarean delivery: Secondary | ICD-10-CM | POA: Diagnosis present

## 2020-03-07 DIAGNOSIS — B182 Chronic viral hepatitis C: Secondary | ICD-10-CM

## 2020-03-07 DIAGNOSIS — O0930 Supervision of pregnancy with insufficient antenatal care, unspecified trimester: Secondary | ICD-10-CM

## 2020-03-07 DIAGNOSIS — Z3480 Encounter for supervision of other normal pregnancy, unspecified trimester: Secondary | ICD-10-CM

## 2020-03-07 DIAGNOSIS — F1910 Other psychoactive substance abuse, uncomplicated: Secondary | ICD-10-CM

## 2020-03-07 DIAGNOSIS — F17210 Nicotine dependence, cigarettes, uncomplicated: Secondary | ICD-10-CM | POA: Diagnosis present

## 2020-03-07 NOTE — MAU Note (Signed)
Pt reports to MAU c/o ctx that are irregular. Pt denies bleeding. Pt states she is unsure if her water is broken. Pt states occasionally she will feel a leak but nothing that soaks her underwear consistently. +FM. Pt reports she has not had PNC in 2 months.

## 2020-03-08 ENCOUNTER — Inpatient Hospital Stay (HOSPITAL_COMMUNITY): Payer: BLUE CROSS/BLUE SHIELD

## 2020-03-08 ENCOUNTER — Encounter (HOSPITAL_COMMUNITY)
Admission: AD | Disposition: A | Discharge status: Home / Self Care | Payer: Self-pay | Source: Home / Self Care | Attending: Family Medicine

## 2020-03-08 ENCOUNTER — Encounter (HOSPITAL_COMMUNITY): Payer: Self-pay | Admitting: Family Medicine

## 2020-03-08 DIAGNOSIS — Z0371 Encounter for suspected problem with amniotic cavity and membrane ruled out: Secondary | ICD-10-CM | POA: Diagnosis not present

## 2020-03-08 DIAGNOSIS — Z3A14 14 weeks gestation of pregnancy: Secondary | ICD-10-CM | POA: Diagnosis not present

## 2020-03-08 DIAGNOSIS — O9842 Viral hepatitis complicating childbirth: Secondary | ICD-10-CM | POA: Diagnosis present

## 2020-03-08 DIAGNOSIS — Z302 Encounter for sterilization: Secondary | ICD-10-CM

## 2020-03-08 DIAGNOSIS — Z880 Allergy status to penicillin: Secondary | ICD-10-CM | POA: Diagnosis not present

## 2020-03-08 DIAGNOSIS — B182 Chronic viral hepatitis C: Secondary | ICD-10-CM | POA: Diagnosis present

## 2020-03-08 DIAGNOSIS — F17210 Nicotine dependence, cigarettes, uncomplicated: Secondary | ICD-10-CM | POA: Diagnosis present

## 2020-03-08 DIAGNOSIS — O26893 Other specified pregnancy related conditions, third trimester: Secondary | ICD-10-CM | POA: Diagnosis present

## 2020-03-08 DIAGNOSIS — O34211 Maternal care for low transverse scar from previous cesarean delivery: Secondary | ICD-10-CM | POA: Diagnosis not present

## 2020-03-08 DIAGNOSIS — O99334 Smoking (tobacco) complicating childbirth: Secondary | ICD-10-CM | POA: Diagnosis present

## 2020-03-08 DIAGNOSIS — Z20822 Contact with and (suspected) exposure to covid-19: Secondary | ICD-10-CM | POA: Diagnosis present

## 2020-03-08 DIAGNOSIS — O480 Post-term pregnancy: Secondary | ICD-10-CM | POA: Diagnosis not present

## 2020-03-08 DIAGNOSIS — D6959 Other secondary thrombocytopenia: Secondary | ICD-10-CM | POA: Diagnosis present

## 2020-03-08 DIAGNOSIS — Z6791 Unspecified blood type, Rh negative: Secondary | ICD-10-CM | POA: Diagnosis not present

## 2020-03-08 DIAGNOSIS — O9912 Other diseases of the blood and blood-forming organs and certain disorders involving the immune mechanism complicating childbirth: Secondary | ICD-10-CM | POA: Diagnosis present

## 2020-03-08 DIAGNOSIS — Z3A41 41 weeks gestation of pregnancy: Secondary | ICD-10-CM

## 2020-03-08 DIAGNOSIS — R109 Unspecified abdominal pain: Secondary | ICD-10-CM | POA: Diagnosis not present

## 2020-03-08 DIAGNOSIS — O0930 Supervision of pregnancy with insufficient antenatal care, unspecified trimester: Secondary | ICD-10-CM

## 2020-03-08 DIAGNOSIS — Z98891 History of uterine scar from previous surgery: Secondary | ICD-10-CM

## 2020-03-08 LAB — TYPE AND SCREEN
ABO/RH(D): A NEG
Antibody Screen: NEGATIVE

## 2020-03-08 LAB — CBC
HCT: 31.7 % — ABNORMAL LOW (ref 36.0–46.0)
Hemoglobin: 9.8 g/dL — ABNORMAL LOW (ref 12.0–15.0)
MCH: 26.6 pg (ref 26.0–34.0)
MCHC: 30.9 g/dL (ref 30.0–36.0)
MCV: 86.1 fL (ref 80.0–100.0)
Platelets: 206 10*3/uL (ref 150–400)
RBC: 3.68 MIL/uL — ABNORMAL LOW (ref 3.87–5.11)
RDW: 15.2 % (ref 11.5–15.5)
WBC: 11.6 10*3/uL — ABNORMAL HIGH (ref 4.0–10.5)
nRBC: 0 % (ref 0.0–0.2)

## 2020-03-08 LAB — DIFFERENTIAL
Abs Immature Granulocytes: 0.03 10*3/uL (ref 0.00–0.07)
Basophils Absolute: 0.1 10*3/uL (ref 0.0–0.1)
Basophils Relative: 1 %
Eosinophils Absolute: 0.1 10*3/uL (ref 0.0–0.5)
Eosinophils Relative: 1 %
Immature Granulocytes: 0 %
Lymphocytes Relative: 47 %
Lymphs Abs: 5.5 10*3/uL — ABNORMAL HIGH (ref 0.7–4.0)
Monocytes Absolute: 0.7 10*3/uL (ref 0.1–1.0)
Monocytes Relative: 6 %
Neutro Abs: 5.2 10*3/uL (ref 1.7–7.7)
Neutrophils Relative %: 45 %

## 2020-03-08 LAB — HIV ANTIBODY (ROUTINE TESTING W REFLEX): HIV Screen 4th Generation wRfx: NONREACTIVE

## 2020-03-08 LAB — RPR: RPR Ser Ql: NONREACTIVE

## 2020-03-08 LAB — RAPID URINE DRUG SCREEN, HOSP PERFORMED
Amphetamines: POSITIVE — AB
Barbiturates: NOT DETECTED
Benzodiazepines: NOT DETECTED
Cocaine: NOT DETECTED
Opiates: NOT DETECTED
Tetrahydrocannabinol: NOT DETECTED

## 2020-03-08 LAB — POCT FERN TEST: POCT Fern Test: NEGATIVE

## 2020-03-08 LAB — HEPATITIS B SURFACE ANTIGEN: Hepatitis B Surface Ag: NONREACTIVE

## 2020-03-08 LAB — SARS CORONAVIRUS 2 BY RT PCR (HOSPITAL ORDER, PERFORMED IN ~~LOC~~ HOSPITAL LAB): SARS Coronavirus 2: NEGATIVE

## 2020-03-08 LAB — ABO/RH: ABO/RH(D): A NEG

## 2020-03-08 SURGERY — Surgical Case
Anesthesia: Spinal | Laterality: Bilateral

## 2020-03-08 MED ORDER — ZOLPIDEM TARTRATE 5 MG PO TABS: 5.0000 mg | ORAL_TABLET | Freq: Every evening | ORAL | Status: DC | PRN

## 2020-03-08 MED ORDER — FENTANYL CITRATE (PF) 100 MCG/2ML IJ SOLN
INTRAMUSCULAR | Status: DC | PRN
  Administered 2020-03-08: 15 ug via INTRATHECAL

## 2020-03-08 MED ORDER — LACTATED RINGERS IV SOLN: INTRAVENOUS | Status: DC

## 2020-03-08 MED ORDER — FENTANYL CITRATE (PF) 100 MCG/2ML IJ SOLN
INTRAMUSCULAR | Status: AC
  Filled 2020-03-08: qty 2

## 2020-03-08 MED ORDER — DIPHENHYDRAMINE HCL 50 MG/ML IJ SOLN
12.5000 mg | INTRAMUSCULAR | Status: DC | PRN
  Administered 2020-03-08: 12.5 mg via INTRAVENOUS

## 2020-03-08 MED ORDER — MORPHINE SULFATE (PF) 0.5 MG/ML IJ SOLN
INTRAMUSCULAR | Status: DC | PRN
  Administered 2020-03-08: 150 ug via INTRATHECAL

## 2020-03-08 MED ORDER — NALBUPHINE HCL 10 MG/ML IJ SOLN: 5.0000 mg | INTRAMUSCULAR | Status: DC | PRN

## 2020-03-08 MED ORDER — ONDANSETRON HCL 4 MG/2ML IJ SOLN
4.0000 mg | Freq: Four times a day (QID) | INTRAMUSCULAR | Status: DC | PRN
  Administered 2020-03-08: 4 mg via INTRAVENOUS
  Filled 2020-03-08: qty 2

## 2020-03-08 MED ORDER — OXYTOCIN 10 UNIT/ML IJ SOLN
INTRAMUSCULAR | Status: DC | PRN
  Administered 2020-03-08: 40 [IU]

## 2020-03-08 MED ORDER — TETANUS-DIPHTH-ACELL PERTUSSIS 5-2.5-18.5 LF-MCG/0.5 IM SUSP: 0.5000 mL | Freq: Once | INTRAMUSCULAR | Status: DC

## 2020-03-08 MED ORDER — ONDANSETRON HCL 4 MG/2ML IJ SOLN
INTRAMUSCULAR | Status: AC
  Filled 2020-03-08: qty 2

## 2020-03-08 MED ORDER — NALBUPHINE HCL 10 MG/ML IJ SOLN
INTRAMUSCULAR | Status: AC
  Filled 2020-03-08: qty 1

## 2020-03-08 MED ORDER — WITCH HAZEL-GLYCERIN EX PADS: 1.0000 "application " | MEDICATED_PAD | CUTANEOUS | Status: DC | PRN

## 2020-03-08 MED ORDER — DIPHENHYDRAMINE HCL 25 MG PO CAPS: 25.0000 mg | ORAL_CAPSULE | ORAL | Status: DC | PRN

## 2020-03-08 MED ORDER — MEPERIDINE HCL 25 MG/ML IJ SOLN: 6.2500 mg | INTRAMUSCULAR | Status: DC | PRN

## 2020-03-08 MED ORDER — SODIUM CHLORIDE 0.9 % IR SOLN
Status: DC | PRN
  Administered 2020-03-08: 1000 mL

## 2020-03-08 MED ORDER — FAMOTIDINE IN NACL 20-0.9 MG/50ML-% IV SOLN
20.0000 mg | Freq: Once | INTRAVENOUS | Status: AC
  Administered 2020-03-08: 20 mg via INTRAVENOUS
  Filled 2020-03-08: qty 50

## 2020-03-08 MED ORDER — SENNOSIDES-DOCUSATE SODIUM 8.6-50 MG PO TABS
2.0000 | ORAL_TABLET | ORAL | Status: DC
  Administered 2020-03-08 – 2020-03-09 (×2): 2 via ORAL
  Filled 2020-03-08 (×2): qty 2

## 2020-03-08 MED ORDER — KETOROLAC TROMETHAMINE 30 MG/ML IJ SOLN
30.0000 mg | Freq: Four times a day (QID) | INTRAMUSCULAR | Status: AC | PRN
  Administered 2020-03-08 – 2020-03-09 (×4): 30 mg via INTRAVENOUS
  Filled 2020-03-08 (×3): qty 1

## 2020-03-08 MED ORDER — NALBUPHINE HCL 10 MG/ML IJ SOLN
5.0000 mg | Freq: Once | INTRAMUSCULAR | Status: AC | PRN
  Administered 2020-03-08: 5 mg via INTRAVENOUS

## 2020-03-08 MED ORDER — DIPHENHYDRAMINE HCL 25 MG PO CAPS: 25.0000 mg | ORAL_CAPSULE | Freq: Four times a day (QID) | ORAL | Status: DC | PRN

## 2020-03-08 MED ORDER — OXYTOCIN 40 UNITS IN NORMAL SALINE INFUSION - SIMPLE MED: 2.5000 [IU]/h | INTRAVENOUS | Status: AC

## 2020-03-08 MED ORDER — CLINDAMYCIN PHOSPHATE 900 MG/50ML IV SOLN
900.0000 mg | INTRAVENOUS | Status: AC
  Administered 2020-03-08: 900 mg via INTRAVENOUS

## 2020-03-08 MED ORDER — NALBUPHINE HCL 10 MG/ML IJ SOLN: 5.0000 mg | Freq: Once | INTRAMUSCULAR | Status: AC | PRN

## 2020-03-08 MED ORDER — KETOROLAC TROMETHAMINE 30 MG/ML IJ SOLN: 30.0000 mg | Freq: Four times a day (QID) | INTRAMUSCULAR | Status: AC | PRN

## 2020-03-08 MED ORDER — NALOXONE HCL 4 MG/10ML IJ SOLN
1.0000 ug/kg/h | INTRAVENOUS | Status: DC | PRN
  Filled 2020-03-08: qty 5

## 2020-03-08 MED ORDER — SIMETHICONE 80 MG PO CHEW: 80.0000 mg | CHEWABLE_TABLET | ORAL | Status: DC | PRN

## 2020-03-08 MED ORDER — PHENYLEPHRINE HCL-NACL 20-0.9 MG/250ML-% IV SOLN
INTRAVENOUS | Status: DC | PRN
  Administered 2020-03-08: 60 ug/min via INTRAVENOUS

## 2020-03-08 MED ORDER — NALOXONE HCL 0.4 MG/ML IJ SOLN: 0.4000 mg | INTRAMUSCULAR | Status: DC | PRN

## 2020-03-08 MED ORDER — STERILE WATER FOR IRRIGATION IR SOLN
Status: DC | PRN
  Administered 2020-03-08: 1000 mL

## 2020-03-08 MED ORDER — MENTHOL 3 MG MT LOZG: 1.0000 | LOZENGE | OROMUCOSAL | Status: DC | PRN

## 2020-03-08 MED ORDER — PRENATAL MULTIVITAMIN CH
1.0000 | ORAL_TABLET | Freq: Every day | ORAL | Status: DC
  Administered 2020-03-09 – 2020-03-10 (×2): 1 via ORAL
  Filled 2020-03-08 (×2): qty 1

## 2020-03-08 MED ORDER — CLINDAMYCIN PHOSPHATE 900 MG/50ML IV SOLN
INTRAVENOUS | Status: AC
  Filled 2020-03-08: qty 50

## 2020-03-08 MED ORDER — SODIUM CHLORIDE 0.9 % IV SOLN: INTRAVENOUS | Status: DC | PRN

## 2020-03-08 MED ORDER — SIMETHICONE 80 MG PO CHEW
80.0000 mg | CHEWABLE_TABLET | Freq: Three times a day (TID) | ORAL | Status: DC
  Administered 2020-03-08 – 2020-03-10 (×5): 80 mg via ORAL
  Filled 2020-03-08 (×6): qty 1

## 2020-03-08 MED ORDER — DIPHENHYDRAMINE HCL 50 MG/ML IJ SOLN
INTRAMUSCULAR | Status: AC
  Filled 2020-03-08: qty 1

## 2020-03-08 MED ORDER — HYDROMORPHONE HCL 1 MG/ML IJ SOLN
0.2500 mg | INTRAMUSCULAR | Status: DC | PRN
  Administered 2020-03-08: 0.5 mg via INTRAVENOUS
  Administered 2020-03-08 (×2): 0.25 mg via INTRAVENOUS

## 2020-03-08 MED ORDER — SODIUM CHLORIDE 0.9 % IV SOLN
510.0000 mg | Freq: Once | INTRAVENOUS | Status: AC
  Administered 2020-03-08: 510 mg via INTRAVENOUS
  Filled 2020-03-08: qty 17

## 2020-03-08 MED ORDER — DEXAMETHASONE SODIUM PHOSPHATE 4 MG/ML IJ SOLN
INTRAMUSCULAR | Status: AC
  Filled 2020-03-08: qty 1

## 2020-03-08 MED ORDER — PHENYLEPHRINE HCL-NACL 20-0.9 MG/250ML-% IV SOLN
INTRAVENOUS | Status: AC
  Filled 2020-03-08: qty 250

## 2020-03-08 MED ORDER — HYDROMORPHONE HCL 1 MG/ML IJ SOLN
INTRAMUSCULAR | Status: AC
  Filled 2020-03-08: qty 1

## 2020-03-08 MED ORDER — ONDANSETRON HCL 4 MG/2ML IJ SOLN: 4.0000 mg | Freq: Three times a day (TID) | INTRAMUSCULAR | Status: DC | PRN

## 2020-03-08 MED ORDER — COCONUT OIL OIL: 1.0000 "application " | TOPICAL_OIL | Status: DC | PRN

## 2020-03-08 MED ORDER — OXYCODONE HCL 5 MG PO TABS
5.0000 mg | ORAL_TABLET | ORAL | Status: DC | PRN
  Administered 2020-03-08 – 2020-03-10 (×7): 10 mg via ORAL
  Filled 2020-03-08 (×7): qty 2

## 2020-03-08 MED ORDER — OXYTOCIN 40 UNITS IN NORMAL SALINE INFUSION - SIMPLE MED
INTRAVENOUS | Status: AC
  Filled 2020-03-08: qty 1000

## 2020-03-08 MED ORDER — SODIUM CHLORIDE 0.9% FLUSH: 3.0000 mL | INTRAVENOUS | Status: DC | PRN

## 2020-03-08 MED ORDER — DEXAMETHASONE SODIUM PHOSPHATE 4 MG/ML IJ SOLN
INTRAMUSCULAR | Status: DC | PRN
  Administered 2020-03-08: 4 mg via INTRAVENOUS

## 2020-03-08 MED ORDER — SOD CITRATE-CITRIC ACID 500-334 MG/5ML PO SOLN
30.0000 mL | ORAL | Status: AC
  Administered 2020-03-08: 30 mL via ORAL
  Filled 2020-03-08: qty 30

## 2020-03-08 MED ORDER — ONDANSETRON HCL 4 MG/2ML IJ SOLN
INTRAMUSCULAR | Status: DC | PRN
  Administered 2020-03-08: 4 mg via INTRAVENOUS

## 2020-03-08 MED ORDER — MORPHINE SULFATE (PF) 0.5 MG/ML IJ SOLN
INTRAMUSCULAR | Status: AC
  Filled 2020-03-08: qty 10

## 2020-03-08 MED ORDER — KETOROLAC TROMETHAMINE 30 MG/ML IJ SOLN
INTRAMUSCULAR | Status: AC
  Filled 2020-03-08: qty 1

## 2020-03-08 MED ORDER — SIMETHICONE 80 MG PO CHEW
80.0000 mg | CHEWABLE_TABLET | ORAL | Status: DC
  Administered 2020-03-08 – 2020-03-09 (×2): 80 mg via ORAL
  Filled 2020-03-08 (×2): qty 1

## 2020-03-08 MED ORDER — BUPIVACAINE IN DEXTROSE 0.75-8.25 % IT SOLN
INTRATHECAL | Status: DC | PRN
  Administered 2020-03-08: 1.6 mL via INTRATHECAL

## 2020-03-08 MED ORDER — GENTAMICIN SULFATE 40 MG/ML IJ SOLN
5.0000 mg/kg | INTRAVENOUS | Status: AC
  Administered 2020-03-08: 270 mg via INTRAVENOUS
  Filled 2020-03-08: qty 6.75

## 2020-03-08 MED ORDER — DIBUCAINE (PERIANAL) 1 % EX OINT: 1.0000 "application " | TOPICAL_OINTMENT | CUTANEOUS | Status: DC | PRN

## 2020-03-08 SURGICAL SUPPLY — 42 items
ADH SKN CLS APL DERMABOND .7 (GAUZE/BANDAGES/DRESSINGS) ×2
CLAMP CORD UMBIL (MISCELLANEOUS) IMPLANT
CLOTH BEACON ORANGE TIMEOUT ST (SAFETY) ×2 IMPLANT
DERMABOND ADVANCED (GAUZE/BANDAGES/DRESSINGS) ×2
DERMABOND ADVANCED .7 DNX12 (GAUZE/BANDAGES/DRESSINGS) ×2 IMPLANT
DRSG OPSITE POSTOP 4X10 (GAUZE/BANDAGES/DRESSINGS) ×2 IMPLANT
ELECT REM PT RETURN 9FT ADLT (ELECTROSURGICAL) ×2
ELECTRODE REM PT RTRN 9FT ADLT (ELECTROSURGICAL) ×1 IMPLANT
EXTRACTOR VACUUM BELL STYLE (SUCTIONS) IMPLANT
GAUZE SPONGE 4X4 12PLY STRL LF (GAUZE/BANDAGES/DRESSINGS) ×2 IMPLANT
GLOVE BIOGEL PI IND STRL 7.0 (GLOVE) ×1 IMPLANT
GLOVE BIOGEL PI IND STRL 8 (GLOVE) ×1 IMPLANT
GLOVE BIOGEL PI INDICATOR 7.0 (GLOVE) ×1
GLOVE BIOGEL PI INDICATOR 8 (GLOVE) ×1
GLOVE ECLIPSE 8.0 STRL XLNG CF (GLOVE) ×2 IMPLANT
GOWN STRL REUS W/TWL LRG LVL3 (GOWN DISPOSABLE) ×4 IMPLANT
KIT ABG SYR 3ML LUER SLIP (SYRINGE) ×2 IMPLANT
NDL HYPO 18GX1.5 BLUNT FILL (NEEDLE) ×1 IMPLANT
NDL HYPO 25X5/8 SAFETYGLIDE (NEEDLE) ×1 IMPLANT
NEEDLE HYPO 18GX1.5 BLUNT FILL (NEEDLE) ×2 IMPLANT
NEEDLE HYPO 22GX1.5 SAFETY (NEEDLE) ×2 IMPLANT
NEEDLE HYPO 25X5/8 SAFETYGLIDE (NEEDLE) ×2 IMPLANT
NS IRRIG 1000ML POUR BTL (IV SOLUTION) ×2 IMPLANT
PACK C SECTION WH (CUSTOM PROCEDURE TRAY) ×2 IMPLANT
PAD ABD 7.5X8 STRL (GAUZE/BANDAGES/DRESSINGS) ×1 IMPLANT
PAD OB MATERNITY 4.3X12.25 (PERSONAL CARE ITEMS) ×2 IMPLANT
PENCIL SMOKE EVAC W/HOLSTER (ELECTROSURGICAL) ×2 IMPLANT
RTRCTR C-SECT PINK 25CM LRG (MISCELLANEOUS) IMPLANT
SUT CHROMIC 0 CT 1 (SUTURE) ×2 IMPLANT
SUT MNCRL 0 VIOLET CTX 36 (SUTURE) ×2 IMPLANT
SUT MONOCRYL 0 CTX 36 (SUTURE) ×4
SUT PLAIN 0 NONE (SUTURE) ×1 IMPLANT
SUT PLAIN 2 0 (SUTURE) ×4
SUT PLAIN 2 0 XLH (SUTURE) IMPLANT
SUT PLAIN ABS 2-0 CT1 27XMFL (SUTURE) ×2 IMPLANT
SUT VIC AB 0 CTX 36 (SUTURE) ×2
SUT VIC AB 0 CTX36XBRD ANBCTRL (SUTURE) ×1 IMPLANT
SUT VIC AB 4-0 KS 27 (SUTURE) IMPLANT
SYR 20CC LL (SYRINGE) ×4 IMPLANT
TOWEL OR 17X24 6PK STRL BLUE (TOWEL DISPOSABLE) ×2 IMPLANT
TRAY FOLEY W/BAG SLVR 14FR LF (SET/KITS/TRAYS/PACK) IMPLANT
WATER STERILE IRR 1000ML POUR (IV SOLUTION) ×2 IMPLANT

## 2020-03-08 NOTE — MAU Note (Signed)
OB anesthesia notified.

## 2020-03-08 NOTE — Transfer of Care (Signed)
Immediate Anesthesia Transfer of Care Note  Patient: Laurie [name]  Procedure(s) Performed: CESAREAN SECTION WITH BILATERAL TUBAL LIGATION (Bilateral )  Patient Location: PACU  Anesthesia Type:Spinal and Epidural  Level of Consciousness: awake  Airway & Oxygen Therapy: Patient Spontanous Breathing  Post-op Assessment: Report given to RN  Post vital signs: Reviewed and stable  Last Vitals:  Vitals Value Taken Time  BP    Temp    Pulse    Resp    SpO2      Last Pain:  Vitals:   03/07/20 2349  PainSc: 8          Complications: No apparent anesthesia complications

## 2020-03-08 NOTE — MAU Provider Note (Signed)
Chief Complaint:  Contractions   First Provider Initiated Contact with Patient 03/08/20 0003     HPI: Laurie [name] is a 24 y.o. [id number] at 51w0dwho presents to maternity admissions reporting contractions (irregular) and some leaking.  No prenatal care. She reports good fetal movement, denies vaginal bleeding, vaginal itching/burning, urinary symptoms, h/a, dizziness, n/v, diarrhea, constipation or fever/chills.    Abdominal Pain This is a new problem. The current episode started today. The onset quality is gradual. The problem occurs intermittently. The problem has been unchanged. The pain is mild. The quality of the pain is cramping. The abdominal pain does not radiate. Associated symptoms include nausea. Pertinent negatives include no constipation, diarrhea, dysuria, fever, frequency, headaches, myalgias or vomiting. Nothing aggravates the pain. The pain is relieved by nothing. She has tried nothing for the symptoms.    RN Note: Pt reports to MAU c/o ctx that are irregular. Pt denies bleeding. Pt states she is unsure if her water is broken. Pt states occasionally she will feel a leak but nothing that soaks her underwear consistently. +FM. Pt reports she has not had PNC in 2 months.  Past Medical History: Past Medical History:  Diagnosis Date  . Anxiety   . Asthma    "grew out"  . Chlamydia   . Depression    doing really good  . Gestational diabetes    with first two pregnancies  . Hepatitis C June 2016  . History of heroin abuse ([place])   . Infection    UTI  . Polysubstance abuse ([place]) 05/28/2018   UDS positive for cocaine, MJ, meth, opioids  . Type A blood, Rh negative     Past obstetric history: OB History  Gravida Para Term Preterm AB Living  5 3 2 1 1 3   SAB TAB Ectopic Multiple Live Births  1 0 0 0 3    # Outcome Date GA Lbr Len/2nd Weight Sex Delivery Anes PTL Lv  5 Current           4 Term 06/05/18 [redacted]w[redacted]d  3055 g M CS-LTranv Spinal  LIV  3 Preterm 12/21/16 [redacted]w[redacted]d   1810 g M CS-LTranv Spinal N LIV     Complications: History of preterm premature rupture of membranes (PPROM), Gestational diabetes  2 Term 01/11/16 [redacted]w[redacted]d  4080 g F CS-LTranv EPI N LIV     Complications: Gestational diabetes  1 SAB 2016            Past Surgical History: Past Surgical History:  Procedure Laterality Date  . CESAREAN SECTION N/A 01/11/2016   Procedure: CESAREAN SECTION;  Surgeon: Shelly Bombard, MD;  Location: [facility];  Service: Obstetrics;  Laterality: N/A;  . CESAREAN SECTION N/A 12/21/2016   Procedure: REPEAT CESAREAN SECTION;  Surgeon: Woodroe Mode, MD;  Location: [facility];  Service: Obstetrics;  Laterality: N/A;  . CESAREAN SECTION N/A 06/05/2018   Procedure: REPEAT CESAREAN SECTION;  Surgeon: Jonnie Kind, MD;  Location: [facility];  Service: Obstetrics;  Laterality: N/A;    Family History: Family History  Problem Relation Age of Onset  . Diabetes Mother   . Pulmonary embolism Mother   . Stroke Mother   . Cancer Paternal Grandmother        liver  . Epilepsy Brother     Social History: Social History   Tobacco Use  . Smoking status: Current Some Day Smoker    Packs/day: 0.25    Types: Cigarettes  . Smokeless tobacco: Never  Used  . Tobacco comment: 4 cigs per day  Substance Use Topics  . Alcohol use: Not Currently    Comment: occasionally  . Drug use: Not Currently    Types: Marijuana, Heroin, Other-see comments    Comment: 05/28/18 +UDS for meth, cocaine, opioids, MJ;  heroin 3 years ago    Allergies:  Allergies  Allergen Reactions  . Keflex [Cephalexin] Hives  . Penicillins Other (See Comments)    Results thru an allergy skin test Has patient had a PCN reaction causing immediate rash, facial/tongue/throat swelling, SOB or lightheadedness with hypotension: No Has patient had a PCN reaction causing severe rash involving mucus membranes or skin necrosis: No Has patient had a PCN reaction that required hospitalization No Has  patient had a PCN reaction occurring within the last 10 years: No If all of the above answers are "NO", then may proceed with Cephalosporin use.      Meds:  Medications Prior to Admission  Medication Sig Dispense Refill Last Dose  . Prenatal Vit-Fe Fumarate-FA (PRENATAL MULTIVITAMIN) TABS tablet Take 1 tablet by mouth daily at 12 noon. 60 tablet 0 More than a month at Unknown time    I have reviewed patient's Past Medical Hx, Surgical Hx, Family Hx, Social Hx, medications and allergies.   ROS:  Review of Systems  Constitutional: Negative for fever.  Gastrointestinal: Positive for abdominal pain and nausea. Negative for constipation, diarrhea and vomiting.  Genitourinary: Negative for dysuria and frequency.  Musculoskeletal: Negative for myalgias.  Neurological: Negative for headaches.   Other systems negative  Physical Exam   Constitutional: Well-developed, well-nourished female in no acute distress.  Cardiovascular: normal rate and rhythm Respiratory: normal effort, clear to auscultation bilaterally GI: Abd soft, non-tender, gravid appropriate for gestational age.   No rebound or guarding. MS: Extremities nontender, no edema, normal ROM Neurologic: Alert and oriented x 4.  GU: Neg CVAT.  PELVIC EXAM: Cervix pink, visually closed, without lesion, scant white creamy discharge, vaginal walls and external genitalia normal    No pooling or ferning    FHT:  Baseline 140 , moderate variability, accelerations present, no decelerations Contractions: Irregular    Labs: Results for orders placed or performed during the hospital encounter of 03/07/20 (from the past 24 hour(s))  CBC     Status: Abnormal   Collection Time: 03/08/20 12:19 AM  Result Value Ref Range   WBC 11.6 (H) 4.0 - 10.5 K/uL   RBC 3.68 (L) 3.87 - 5.11 MIL/uL   Hemoglobin 9.8 (L) 12.0 - 15.0 g/dL   HCT 62.1 (L) 30.8 - 65.7 %   MCV 86.1 80.0 - 100.0 fL   MCH 26.6 26.0 - 34.0 pg   MCHC 30.9 30.0 - 36.0 g/dL    RDW 84.6 96.2 - 95.2 %   Platelets 206 150 - 400 K/uL   nRBC 0.0 0.0 - 0.2 %  Differential     Status: Abnormal   Collection Time: 03/08/20 12:19 AM  Result Value Ref Range   Neutrophils Relative % 45 %   Neutro Abs 5.2 1.7 - 7.7 K/uL   Lymphocytes Relative 47 %   Lymphs Abs 5.5 (H) 0.7 - 4.0 K/uL   Monocytes Relative 6 %   Monocytes Absolute 0.7 0.1 - 1.0 K/uL   Eosinophils Relative 1 %   Eosinophils Absolute 0.1 0.0 - 0.5 K/uL   Basophils Relative 1 %   Basophils Absolute 0.1 0.0 - 0.1 K/uL   Immature Granulocytes 0 %   Abs Immature  Granulocytes 0.03 0.00 - 0.07 K/uL  Type and screen     Status: None (Preliminary result)   Collection Time: 03/08/20 12:19 AM  Result Value Ref Range   ABO/RH(D) PENDING    Antibody Screen PENDING    Sample Expiration      03/11/2020,2359 Performed at [facility], [address], [place], [place] [address]   Crist Fat Test     Status: Normal   Collection Time: 03/08/20 12:41 AM  Result Value Ref Range   POCT Fern Test Negative = intact amniotic membranes    Other labs pending    Imaging:  No results found.  MAU Course/MDM: I have ordered labs and reviewed results. OB panel ordered.  Added Hep C viral titer NST reviewed, equivocal.  Baseline variability is normal   Small accels present but none that meet 15x15 criteria.  No decels.  Consult Dr Adrian Blackwater and Crissie Reese with presentation, exam findings and test results.   Discussed that since she is postdates we can deliver her via repeat C/S in the am.  See consult note by Dr Crissie Reese  Assessment: Single IUP at [redacted]w[redacted]d by LMP (US done at 29wks was close to these dates) Irregular contractions Vaginal discharge, no evidence of PROM  Plan: Admit for OR In am Plan per MD, routine OR prep MD to follow  Wynelle Bourgeois CNM, MSN Certified Nurse-Midwife 03/08/2020 12:03 AM

## 2020-03-08 NOTE — MAU Note (Signed)
OB OR charge nurse notified. Alesha RN states she has spoke with Dr.Eckstat and is aware of the patient. She will let anesthesia know about the patient as well. I told her she can feel free to call me for more information if needed.   [facility] notified.  MB charge nurse notified.

## 2020-03-08 NOTE — Anesthesia Postprocedure Evaluation (Addendum)
Anesthesia Post Note  Patient: Laurie [name]  Procedure(s) Performed: CESAREAN SECTION WITH BILATERAL TUBAL LIGATION (Bilateral )     Patient location during evaluation: PACU Anesthesia Type: Spinal Level of consciousness: oriented and awake and alert Pain management: pain level controlled Vital Signs Assessment: post-procedure vital signs reviewed and stable Respiratory status: spontaneous breathing, respiratory function stable and nonlabored ventilation Cardiovascular status: blood pressure returned to baseline and stable Postop Assessment: no headache, no backache, no apparent nausea or vomiting, spinal receding and patient able to bend at knees Anesthetic complications: no    Last Vitals:  Vitals:   03/08/20 1100 03/08/20 1115  BP: 102/60 (!) 100/44  Pulse: 73 70  Resp: 13 20  Temp:  36.6 C  SpO2: 100% 100%    Last Pain:  Vitals:   03/08/20 1115  TempSrc: Oral  PainSc:    Pain Goal:    LLE Motor Response: Purposeful movement (03/08/20 1100) LLE Sensation: Tingling (03/08/20 1100) RLE Motor Response: Purposeful movement (03/08/20 1100) RLE Sensation: Tingling (03/08/20 1100)     Epidural/Spinal Function Cutaneous sensation: Tingles (03/08/20 1100), Patient able to flex knees: Yes (03/08/20 1100), Patient able to lift hips off bed: No (03/08/20 1100), Back pain beyond tenderness at insertion site: No (03/08/20 1100), Progressively worsening motor and/or sensory loss: No (03/08/20 1100), Bowel and/or bladder incontinence post epidural: No (03/08/20 1100)  Kavi Almquist A.

## 2020-03-08 NOTE — Anesthesia Preprocedure Evaluation (Addendum)
Anesthesia Evaluation  Patient identified by MRN, date of birth, ID band Patient awake    Reviewed: Allergy & Precautions, NPO status , Patient's Chart, lab work & pertinent test results  Airway Mallampati: II  TM Distance: >3 FB Neck ROM: Full    Dental  (+) Poor Dentition, Dental Advisory Given   Pulmonary asthma , Current Smoker and Patient abstained from smoking.,    Pulmonary exam normal breath sounds clear to auscultation       Cardiovascular negative cardio ROS Normal cardiovascular exam Rhythm:Regular Rate:Normal     Neuro/Psych PSYCHIATRIC DISORDERS Anxiety Depression negative neurological ROS     GI/Hepatic negative GI ROS, (+)     substance abuse  marijuana use, methamphetamine use and IV drug use, Hepatitis -, CDenies recent drug use- UDS + for amphetamine   Endo/Other  diabetesGestational DM with 1st 2 pregnancies  Renal/GU      Musculoskeletal negative musculoskeletal ROS (+) narcotic dependent  Abdominal   Peds  Hematology  (+) anemia , Hx/o Gestational thrombocytopenia   Anesthesia Other Findings   Reproductive/Obstetrics (+) Pregnancy Previous C/Section x 3 No PNC 41 weeks                             Anesthesia Physical Anesthesia Plan  ASA: III  Anesthesia Plan: Combined Spinal and Epidural   Post-op Pain Management:    Induction:   PONV Risk Score and Plan: 3 and Scopolamine patch - Pre-op, Ondansetron, Dexamethasone and Treatment may vary due to age or medical condition  Airway Management Planned: Natural Airway  Additional Equipment:   Intra-op Plan:   Post-operative Plan:   Informed Consent: I have reviewed the patients History and Physical, chart, labs and discussed the procedure including the risks, benefits and alternatives for the proposed anesthesia with the patient or authorized representative who has indicated his/her understanding and  acceptance.     Dental advisory given  Plan Discussed with: CRNA and Surgeon  Anesthesia Plan Comments:         Anesthesia Quick Evaluation

## 2020-03-08 NOTE — Anesthesia Procedure Notes (Signed)
Spinal  Patient location during procedure: OR Start time: 03/08/2020 9:10 AM End time: 03/08/2020 9:14 AM Staffing Performed: anesthesiologist  Anesthesiologist: Mal Amabile, MD Preanesthetic Checklist Completed: patient identified, IV checked, site marked, risks and benefits discussed, surgical consent, monitors and equipment checked, pre-op evaluation and timeout performed Spinal Block Patient position: sitting Prep: DuraPrep and site prepped and draped Patient monitoring: heart rate, cardiac monitor, continuous pulse ox and blood pressure Approach: midline Location: L3-4 Injection technique: single-shot Needle Needle type: Pencan  Needle gauge: 24 G Needle length: 9 cm Needle insertion depth: 5 cm Catheter type: closed end flexible Catheter size: 19 g Assessment Sensory level: T4 Events: paresthesia Additional Notes Epidural performed using LOR with air technique. No CSF, Heme or paresthesias. SAB performed through the epidural needle using 24ga Spinocan needle. CSF clear with free flow and transient  Paresthesia right leg which resolved with repositioning of spinal needle.. Local anesthetic and narcotics injected through the spinal needle and withdrawn. Epidural catheter threaded 5cm into the epidural space and the epidural needle was withdrawn. A sterile dressing was applied and the patient placed supine with LUD. The patient tolerated the procedure well and adequate sensory level was obtained.

## 2020-03-08 NOTE — Progress Notes (Signed)
1115 Epidural catheter removed with tip intact. No bleeding notes at site.

## 2020-03-08 NOTE — Discharge Summary (Addendum)
Postpartum Discharge Summary       Patient Name: Laurie [name] DOB: 1996/02/19 MRN: [id number]  Date of admission: 03/07/2020 Delivery date:03/08/2020  Delivering provider: Laurey Arrow [place]  Date of discharge: 03/10/2020  Admitting diagnosis: History of cesarean section [Z98.891] Status post repeat low transverse cesarean section [Z98.891] Intrauterine pregnancy: [redacted]w[redacted]d    Secondary diagnosis:  Active Problems:   [place]ubstance [place] ([place]   Tobacco abuse   Rh negative state in antepartum period   Hepatitis C   Encounter for supervision of other normal pregnancy, unspecified trimester   No prenatal care in current pregnancy   History of cesarean section   Status post repeat low transverse cesarean section  Additional problems: None    Discharge diagnosis: Term Pregnancy Delivered                                              Post partum procedures:IV iron Augmentation: N/A Complications: None  Hospital course: Sceduled C/S   24y.o. yo [id number] 458w0das admitted to the hospital 03/07/2020 for cesarean section after she presented to MAU with possible ctx but at 41 weeks with 3 prior c-sections; no prenatal care in this pregnancy. Delivery details are as follows:  Membrane Rupture Time/Date: 9:34 AM ,03/08/2020   Delivery Method:C-Section, Low Transverse  Details of operation can be found in separate operative note. BTL done intraoperatively. Patient had an uncomplicated postpartum course. SW consulted for UDS and no prenatal care- CPS involved and there are barriers to infant being discharged with mom. She is ambulating, tolerating a regular diet, passing flatus, and urinating well. Patient is discharged home in stable condition on  03/10/20        Newborn Data: Birth date:03/08/2020  Birth time:9:35 AM  Gender:Female  Living status:Living  Apgars:8 ,9  Weight:3420 g     Magnesium Sulfate received: No BMZ received: No Rhophylac:Yes MMR:Yes T-DaP:Given postpartum Flu:  No Transfusion:Yes, feraheme  Physical exam  Vitals:   03/08/20 2324 03/09/20 0429 03/09/20 1515 03/10/20 0521  BP: 92/60 95/70 106/65 (!) 79/57  Pulse: (!) 58 68 75 70  Resp: 16 16 18 16   Temp: 98.2 F (36.8 C) 98.7 F (37.1 C) 98.5 F (36.9 C) 98.5 F (36.9 C)  TempSrc: Oral Oral Oral Oral  SpO2:  99% 100%   Weight:      Height:       General: alert, cooperative and no distress Lochia: appropriate Uterine Fundus: firm Incision: Healing well with no significant drainage DVT Evaluation: No evidence of DVT seen on physical exam. Labs: Lab Results  Component Value Date   WBC 15.0 (H) 03/09/2020   HGB 7.3 (L) 03/09/2020   HCT 23.6 (L) 03/09/2020   MCV 87.4 03/09/2020   PLT 164 03/09/2020   CMP Latest Ref Rng & Units 06/06/2018  Glucose 70 - 99 mg/dL -  BUN 6 - 20 mg/dL -  Creatinine 0.44 - 1.00 mg/dL 0.63  Sodium 135 - 145 mmol/L -  Potassium 3.5 - 5.1 mmol/L -  Chloride 98 - 111 mmol/L -  CO2 22 - 32 mmol/L -  Calcium 8.9 - 10.3 mg/dL -  Total Protein 6.5 - 8.1 g/dL -  Total Bilirubin 0.3 - 1.2 mg/dL -  Alkaline Phos 38 - 126 U/L -  AST 15 - 41 U/L -  ALT 0 - 44  U/L -   Edinburgh Score: Edinburgh Postnatal Depression Scale Screening Tool 06/06/2018  I have been able to laugh and see the funny side of things. 0  I have looked forward with enjoyment to things. 0  I have blamed myself unnecessarily when things went wrong. 2  I have been anxious or worried for no good reason. 0  I have felt scared or panicky for no good reason. 0  Things have been getting on top of me. 1  I have been so unhappy that I have had difficulty sleeping. 0  I have felt sad or miserable. 1  I have been so unhappy that I have been crying. 0  The thought of harming myself has occurred to me. 0  Edinburgh Postnatal Depression Scale Total 4     After visit meds:  Allergies as of 03/10/2020      Reactions   Keflex [cephalexin] Hives   Penicillins Other (See Comments)   Results thru an  allergy skin test Has patient had a PCN reaction causing immediate rash, facial/tongue/throat swelling, SOB or lightheadedness with hypotension: No Has patient had a PCN reaction causing severe rash involving mucus membranes or skin necrosis: No Has patient had a PCN reaction that required hospitalization No Has patient had a PCN reaction occurring within the last 10 years: No If all of the above answers are "NO", then may proceed with Cephalosporin use.      Medication List    TAKE these medications   acetaminophen 325 MG tablet Commonly known as: Tylenol Take 2 tablets (650 mg total) by mouth every 4 (four) hours as needed.   calcium carbonate 500 MG chewable tablet Commonly known as: TUMS - dosed in mg elemental calcium Chew 1-2 tablets by mouth as needed for indigestion or heartburn.   Ferrous Fumarate 324 (106 Fe) MG Tabs tablet Commonly known as: HEMOCYTE - 106 mg FE Take 1 tablet (106 mg of iron total) by mouth daily.   ibuprofen 800 MG tablet Commonly known as: ADVIL Take 1 tablet (800 mg total) by mouth every 8 (eight) hours as needed.   oxyCODONE 5 MG immediate release tablet Commonly known as: Oxy IR/ROXICODONE Take 1-2 tablets (5-10 mg total) by mouth every 4 (four) hours as needed for moderate pain.   prenatal multivitamin Tabs tablet Take 1 tablet by mouth daily at 12 noon.        Discharge home in stable condition Infant Feeding: Breast Infant Disposition:pending SW consult Discharge instruction: per After Visit Summary and Postpartum booklet. Activity: Advance as tolerated. Pelvic rest for 6 weeks.  Diet: routine diet Future Appointments: Future Appointments  Date Time Provider [place]  03/14/2020 10:10 AM Florian Buff, MD [facility]  04/12/2020 10:50 AM Roma Schanz, CNM [facility]   Follow up Visit:   Please schedule this patient for a Virtual postpartum visit in 4 weeks with the following provider: Any  provider. Additional Postpartum F/U:Incision check 1 week  Low risk pregnancy complicated by: no PNC Delivery mode:  C-Section, Low Transverse  Anticipated Birth Control:  BTL done PP   03/10/2020 Paulanthony Gleaves L Rudolfo Brandow, DO

## 2020-03-08 NOTE — Op Note (Signed)
Laurie [name] PROCEDURE DATE: 03/08/2020  PREOPERATIVE DIAGNOSES: Intrauterine pregnancy at [redacted]w[redacted]d weeks gestation; 3 prior c-sections; undesired fertility  POSTOPERATIVE DIAGNOSES: The same  PROCEDURE: Repeat Low Transverse Cesarean Section, Bilateral Tubal Sterilization using Pomeroy method  SURGEON:  Dr. Shonna Chock - Primary Dr. Jerilynn Birkenhead - Fellow  ANESTHESIOLOGIST: Dr. Malen Gauze  INDICATIONS: Laurie [name] is a 24 y.o. [id number] at [redacted]w[redacted]d here for cesarean section and bilateral tubal sterilization secondary to the indications listed under preoperative diagnoses; please see preoperative note for further details.  The risks of surgery were discussed with the patient including but were not limited to: bleeding which may require transfusion or reoperation; infection which may require antibiotics; injury to bowel, bladder, ureters or other surrounding organs; injury to the fetus; need for additional procedures including hysterectomy in the event of a life-threatening hemorrhage; formation of adhesions; placental abnormalities wth subsequent pregnancies; incisional problems; thromboembolic phenomenon and other postoperative/anesthesia complications.  Patient also desires permanent sterilization.  Other reversible forms of contraception were discussed with patient; she declines all other modalities.   Risks of sterilization procedure discussed with patient including but not limited to: risk of regret, permanence of method, bleeding, infection, injury to surrounding organs and need for additional procedures.  Failure risk of about 1% with increased risk of ectopic gestation if pregnancy occurs was also discussed with patient.  Also discussed possibility of post-tubal pain syndrome. The patient concurred with the proposed plan, giving informed written consent for the procedures.  FINDINGS:  Viable female infant in cephalic presentation. Mild meconium-stained amniotic fluid.  Intact placenta, three vessel cord.   Normal uterus, fallopian tubes and ovaries bilaterally. Fallopian tubes were sterilized bilaterally. APGAR (1 MIN): 8  APGAR (5 MINS): 9  APGAR (10 MINS):    ANESTHESIA: Spinal INTRAVENOUS FLUIDS: 1500 ml ESTIMATED BLOOD LOSS: 275 ml URINE OUTPUT:  175 ml SPECIMENS: Placenta sent to L&D and bilateral fallopian tube fragments sent to pathology COMPLICATIONS: None   PROCEDURE IN DETAIL:  The patient preoperatively received intravenous antibiotics and had sequential compression devices applied to her lower extremities.   She was then taken to the operating room where spinal anesthesia was administered and was found to be adequate. She was then placed in a dorsal supine position with a leftward tilt, and prepped and draped in a sterile manner.  A foley catheter was placed into her bladder and attached to constant gravity.  After an adequate timeout was performed, a Pfannenstiel skin incision was made with scalpel over her preexisting scar and carried through to the underlying layer of fascia. The fascia was incised in the midline, and this incision was extended bilaterally using the Mayo scissors.  Kocher clamps were applied to the superior aspect of the fascial incision and the underlying rectus muscles were dissected off bluntly. The rectus muscles were separated in the midline and the peritoneum was entered bluntly. The Alexis Jankovich-retaining retractor was introduced into the abdominal cavity.  Attention was turned to the lower uterine segment where a bladder flap was created and then a low transverse hysterotomy was made with a scalpel and extended bilaterally bluntly.  The infant was successfully delivered, the cord was clamped and cut after one minute and the infant was handed over to the awaiting neonatology team. Uterine massage was then administered, and the placenta delivered intact with a three-vessel cord. The uterus was then cleared of clots and debris.  The hysterotomy was closed with 0 Vicryl  in a running locked fashion, and an imbricating layer  was also placed with 0 Vicryl. Attention was then turned to the fallopian tubes, the Babcock clamp was then used to grasp the right fallopian tube approximately 3 cm from the cornual region. A 3 cm segment of the tube was then doubly ligated with free tie of plain gut suture, transected and excised. Good hemostasis was noted. The left fallopian tube was then identified, doubly ligated, and a 3 cm segment excised in a similar fashion allowing for bilateral tubal sterilization.  Excellent hemostasis was noted. The pelvis was cleared of all clot and debris. Hemostasis was confirmed on all surfaces.  The retractor was removed.  The peritoneum was closed with a 0 Vicryl running stitch. The fascia was then closed using 0 Vicryl in a running fashion.  The subcutaneous layer was irrigated. The skin was closed with a 4-0 Vicryl subcuticular stitch. The patient tolerated the procedure well. Sponge, instrument and needle counts were correct x 3.  She was taken to the recovery room in stable condition.   Barrington Ellison, MD [facility] Family Medicine Fellow, [facility] for [employer], [place]

## 2020-03-08 NOTE — H&P (Signed)
LABOR AND DELIVERY ADMISSION HISTORY AND PHYSICAL NOTE  Laurie [name] is a 24 y.o. female [id number] with IUP at [redacted]w[redacted]d by LMP c/w 29 wk [place] presenting for labor check.   She reports positive fetal movement. She denies vaginal bleeding. She is unsure about leakage of fluid.    She plans on breast feeding. She requests BTL for birth control.  Patient has had three prior cesareans. No prenatal care this pregnancy, states this is because of transportation issues.   Prenatal History/Complications: No PNC  Sono:  @[redacted]w[redacted]d , CWD, normal anatomy, cephalic presentation, fundal placenta, 23%ile, EFW 1359 g  PMHx:  - hx of polysubstance use disorder - Rh neg, did not receive rhogam this pregnancy - Chronic HCV - hx gestational thromboctyopenia - hx of PPROM with preterm delivery at 33 weeks   Past Medical History: Past Medical History:  Diagnosis Date  . Anxiety   . Asthma    "grew out"  . Chlamydia   . Depression    doing really good  . Gestational diabetes    with first two pregnancies  . Hepatitis C June 2016  . History of heroin abuse (HCC)   . Infection    UTI  . Polysubstance abuse (HCC) 05/28/2018   UDS positive for cocaine, MJ, meth, opioids  . Type A blood, Rh negative     Past Surgical History: Past Surgical History:  Procedure Laterality Date  . CESAREAN SECTION N/A 01/11/2016   Procedure: CESAREAN SECTION;  Surgeon: 01/13/2016, MD;  Location: [facility];  Service: Obstetrics;  Laterality: N/A;  . CESAREAN SECTION N/A 12/21/2016   Procedure: REPEAT CESAREAN SECTION;  Surgeon: 02/20/2017, MD;  Location: [facility] BIRTHING SUITES;  Service: Obstetrics;  Laterality: N/A;  . CESAREAN SECTION N/A 06/05/2018   Procedure: REPEAT CESAREAN SECTION;  Surgeon: 06/07/2018, MD;  Location: [facility] BIRTHING SUITES;  Service: Obstetrics;  Laterality: N/A;    Obstetrical History: OB History    Gravida  5   Para  3   Term  2   Preterm  1   AB  1   Living  3     SAB  1    TAB  0   Ectopic  0   Multiple  0   Live Births  3           Social History: Social History   Socioeconomic History  . Marital status: Single    Spouse name: Not on file  . Number of children: 2  . Years of education: Not on file  . Highest education level: Not on file  Occupational History  . Not on file  Tobacco Use  . Smoking status: Current Some Day Smoker    Packs/day: 0.25    Types: Cigarettes  . Smokeless tobacco: Never Used  . Tobacco comment: 4 cigs per day  Substance and Sexual Activity  . Alcohol use: Not Currently    Comment: occasionally  . Drug use: Not Currently    Types: Marijuana, Heroin, Other-see comments    Comment: 05/28/18 +UDS for meth, cocaine, opioids, MJ;  heroin 3 years ago  . Sexual activity: Yes    Birth control/protection: None  Other Topics Concern  . Not on file  Social History Narrative  . Not on file   Social Determinants of Health   Financial Resource Strain:   . Difficulty of Paying Living Expenses:   Food Insecurity:   . Worried About 07/28/18 in the  Last Year:   . Ran Out of Food in the Last Year:   Transportation Needs:   . Freight forwarder (Medical):   Marland Kitchen Lack of Transportation (Non-Medical):   Physical Activity:   . Days of Exercise per Week:   . Minutes of Exercise per Session:   Stress:   . Feeling of Stress :   Social Connections:   . Frequency of Communication with Friends and Family:   . Frequency of Social Gatherings with Friends and Family:   . Attends Religious Services:   . Active Member of Clubs or Organizations:   . Attends Banker Meetings:   Marland Kitchen Marital Status:     Family History: Family History  Problem Relation Age of Onset  . Diabetes Mother   . Pulmonary embolism Mother   . Stroke Mother   . Cancer Paternal Grandmother        liver  . Epilepsy Brother     Allergies: Allergies  Allergen Reactions  . Keflex [Cephalexin] Hives  . Penicillins Other (See  Comments)    Results thru an allergy skin test Has patient had a PCN reaction causing immediate rash, facial/tongue/throat swelling, SOB or lightheadedness with hypotension: No Has patient had a PCN reaction causing severe rash involving mucus membranes or skin necrosis: No Has patient had a PCN reaction that required hospitalization No Has patient had a PCN reaction occurring within the last 10 years: No If all of the above answers are "NO", then may proceed with Cephalosporin use.      Medications Prior to Admission  Medication Sig Dispense Refill Last Dose  . Prenatal Vit-Fe Fumarate-FA (PRENATAL MULTIVITAMIN) TABS tablet Take 1 tablet by mouth daily at 12 noon. 60 tablet 0 More than a month at Unknown time     Review of Systems  All systems reviewed and negative except as stated in HPI  Physical Exam Last menstrual period 05/26/2019, unknown if currently breastfeeding. General appearance: alert, oriented, NAD Lungs: normal respiratory effort Heart: regular rate Abdomen: soft, non-tender; gravid Extremities: No calf swelling or tenderness FHR: baseline 145, moderate variability, +accel, no decels, rare contractions  Prenatal labs: ABO, Rh: --/--/PENDING (05/19 0019) Antibody: PENDING (05/19 0019) Rubella:   RPR:    HBsAg:    HIV:    GC/Chlamydia:   GBS:    2-hr GTT:  Genetic screening:  Not done Anatomy US: normal  Prenatal Transfer Tool  Maternal Diabetes: unknown Genetic Screening: not done Maternal Ultrasounds/Referrals: Normal Fetal Ultrasounds or other Referrals:  None Maternal Substance Abuse:  unknown Significant Maternal Medications:  None Significant Maternal Lab Results: Other: chronic HCV  Results for orders placed or performed during the hospital encounter of 03/07/20 (from the past 24 hour(s))  CBC   Collection Time: 03/08/20 12:19 AM  Result Value Ref Range   WBC 11.6 (H) 4.0 - 10.5 K/uL   RBC 3.68 (L) 3.87 - 5.11 MIL/uL   Hemoglobin 9.8 (L)  12.0 - 15.0 g/dL   HCT 44.0 (L) 10.2 - 72.5 %   MCV 86.1 80.0 - 100.0 fL   MCH 26.6 26.0 - 34.0 pg   MCHC 30.9 30.0 - 36.0 g/dL   RDW 36.6 44.0 - 34.7 %   Platelets 206 150 - 400 K/uL   nRBC 0.0 0.0 - 0.2 %  Differential   Collection Time: 03/08/20 12:19 AM  Result Value Ref Range   Neutrophils Relative % 45 %   Neutro Abs 5.2 1.7 - 7.7 K/uL   Lymphocytes  Relative 47 %   Lymphs Abs 5.5 (H) 0.7 - 4.0 K/uL   Monocytes Relative 6 %   Monocytes Absolute 0.7 0.1 - 1.0 K/uL   Eosinophils Relative 1 %   Eosinophils Absolute 0.1 0.0 - 0.5 K/uL   Basophils Relative 1 %   Basophils Absolute 0.1 0.0 - 0.1 K/uL   Immature Granulocytes 0 %   Abs Immature Granulocytes 0.03 0.00 - 0.07 K/uL  Type and screen   Collection Time: 03/08/20 12:19 AM  Result Value Ref Range   ABO/RH(D) PENDING    Antibody Screen PENDING    Sample Expiration      03/11/2020,2359 Performed at [facility], [place] [address], [place], [place] [address]   Maryann Alar Test   Collection Time: 03/08/20 12:41 AM  Result Value Ref Range   POCT Fern Test Negative = intact amniotic membranes     Patient Active Problem List   Diagnosis Date Noted  . No prenatal care in current pregnancy 03/08/2020  . History of cesarean section 03/08/2020  . Late prenatal care in third trimester 12/20/2019  . Encounter for supervision of other normal pregnancy, unspecified trimester 12/20/2019  . Gestational thrombocytopenia ([place]) 12/20/2016  . Tobacco abuse 11/25/2016  . Rh negative state in antepartum period 11/25/2016  . Polysubstance abuse ([place]) 08/31/2015  . Hepatitis C 03/22/2015    Assessment: Laurie [name] Patient is a 24 y.o. [id number] at [redacted]w[redacted]d here for scheduled CS.  #RCS: #BTL:  Term and [redacted]w[redacted]d with good dating, not currently in labor and ruled out for rupture Recommended for cesarean in AM once completes adequate NPO status and prenatal labs are back, she is in agreement with plan Also spontaneously requested BTL at time of  cesarean The risks of cesarean section were discussed with the patient including but were not limited to: bleeding which may require transfusion or reoperation; infection which may require antibiotics; injury to bowel, bladder, ureters or other surrounding organs; injury to the fetus; need for additional procedures including hysterectomy in the event of a life-threatening hemorrhage; placental abnormalities wth subsequent pregnancies, incisional problems, thromboembolic phenomenon and other postoperative/anesthesia complications.  Patient also desires permanent sterilization.  Other reversible forms of contraception were discussed with patient; she declines all other modalities. Risks of procedure discussed with patient including but not limited to: risk of regret, permanence of method, bleeding, infection, injury to surrounding organs and need for additional procedures.  Failure risk of about 1% with increased risk of ectopic gestation if pregnancy occurs was also discussed with patient.  Also discussed possibility of post-tubal pain syndrome. The patient concurred with the proposed plan, giving informed written consent for the procedures.  Patient has been NPO since 2250 she will remain NPO for procedure. Anesthesia and OR aware.  Preoperative prophylactic antibiotics and SCDs ordered on call to the OR.  To OR when ready.  #Anesthesia: Spinal #FWB: Cat I #GBS/ID: unknown #COVID: swab pending #MOF: Breast, pending prenatal labs #MOC: BTL #Circ: TBD  #No PNC: SW consult PP. Baseline prenatal labs in process. UDS ordered.   #Chronic HCV: viral load pending  #Rh neg: rhogam eval PP  Clarnce Flock 03/08/2020, 1:02 AM

## 2020-03-09 ENCOUNTER — Encounter: Payer: Self-pay | Admitting: *Deleted

## 2020-03-09 LAB — CBC
HCT: 23.6 % — ABNORMAL LOW (ref 36.0–46.0)
Hemoglobin: 7.3 g/dL — ABNORMAL LOW (ref 12.0–15.0)
MCH: 27 pg (ref 26.0–34.0)
MCHC: 30.9 g/dL (ref 30.0–36.0)
MCV: 87.4 fL (ref 80.0–100.0)
Platelets: 164 10*3/uL (ref 150–400)
RBC: 2.7 MIL/uL — ABNORMAL LOW (ref 3.87–5.11)
RDW: 15.1 % (ref 11.5–15.5)
WBC: 15 10*3/uL — ABNORMAL HIGH (ref 4.0–10.5)
nRBC: 0 % (ref 0.0–0.2)

## 2020-03-09 LAB — HCV RNA QUANT
HCV Quantitative Log: 6.533 log10 IU/mL (ref >1.70)
HCV Quantitative: 3410000 IU/mL (ref >50)

## 2020-03-09 LAB — RUBELLA SCREEN: Rubella: <0.9 index — ABNORMAL LOW (ref >0.99)

## 2020-03-09 LAB — SURGICAL PATHOLOGY

## 2020-03-09 MED ORDER — RHO D IMMUNE GLOBULIN 1500 UNIT/2ML IJ SOSY
300.0000 ug | PREFILLED_SYRINGE | Freq: Once | INTRAMUSCULAR | Status: AC
  Administered 2020-03-09: 300 ug via INTRAVENOUS
  Filled 2020-03-09: qty 2

## 2020-03-09 MED ORDER — FERROUS FUMARATE 324 (106 FE) MG PO TABS
1.0000 | ORAL_TABLET | Freq: Every day | ORAL | Status: DC
  Administered 2020-03-09 – 2020-03-10 (×2): 106 mg via ORAL
  Filled 2020-03-09 (×2): qty 1

## 2020-03-09 NOTE — Progress Notes (Signed)
CSW received call back from [facility] CPS intake. CSW was advised that case was assigned to C. Williams [phone] .CSW reached out to number provided-left voicemail at this time.      Claude Manges Annette Liotta, MSW, LCSW [facility] [phone]

## 2020-03-09 NOTE — Clinical Social Work Maternal (Signed)
CLINICAL SOCIAL WORK MATERNAL/CHILD NOTE  Patient Details  Name: Laurie [name] MRN: [id number] Date of Birth: 1995-11-10  Date:  03/09/2020  Clinical Social Worker Initiating Note:  Hortencia Pilar, LCSW Date/Time: Initiated:  03/09/20/0900     Child's Name:  Rollene Fare   Biological Parents:  Mother, Father(Laurie [name], Corwin Levins)   Need for Interpreter:  None   Reason for Referral:  Current Substance Use/Substance Use During Pregnancy , Late or No Prenatal Care , Current CPS Involvement   Address:  [address] [place] [address]    Phone number:  [phone] (home)     Additional phone number: none reported.   Household Members/Support Persons (HM/SP):   Household Member/Support Person 1   HM/SP Name Relationship DOB or Age  HM/SP -1 Rukiya Quattrone MOB    HM/SP -2   Corwin Levins  FOB     HM/SP -3   Rushie Goltz Aloisi  son   06/05/2018  HM/SP -4        HM/SP -5        HM/SP -6        HM/SP -7        HM/SP -8          Natural Supports (not living in the home):  Parent   Professional Supports: None   Employment: Unemployed   Type of Work: none   Education:  9 to 11 years   Homebound arranged: No  Financial Resources:  [employer]   Other Resources:  Sales executive , WIC(plans to apply for [facility])   Cultural/Religious Considerations Which May Impact Care:  none reported.   Strengths:  Home prepared for child    Psychotropic Medications:    none reported to CSW.      Pediatrician:     MOB unsure of Ped at this time.   Pediatrician List:   [facility]      Pediatrician Fax Number:    Risk Factors/Current Problems:  Substance Use , Transportation , Mental Health Concerns , Basic Needs    Cognitive State:  Able to Concentrate , Alert , Insightful    Mood/Affect:  Relaxed , Comfortable , Calm , Interested    CSW Assessment: CSW consulted  as MOB had previous CPS hx, substance use during this pregnancy and other stressors. CSW went to speak with MOB at bedside to address needs.  CSW congratulated MOB on the birth of infant. CSW didn't see FOB in the room until walking in further, CSW then congratulated FOB on the birth. CSW advised MOB of CSW's role and the reason for CSW coming to visit with her. CSW advised MOB of the HIPPA policy and MOB reported that she wanted FOB to remain in the room. CSW understanding and proceeded with assessing MOB. MOB reported that she didnt get care due to no transportation. MOB reported that she didn't have a ride to appointments, therefore she didn't go. CSW inquired from [facility] on any other reason MOB may have not gotten care and MOB reported that this was the only reason.  CSW inquired from [facility] on her substance use during this pregnancy. MOB was very open and forthcoming with information. MOB reported that she used Meth and THC earlier in her pregnancy "before I found out I was pregnancy. This wasn't a planned pregnancy but it wasn't an oops either".  CSW understanding and asked MOB if she had any other substance use since then? MOB declined. CSW advised MOB that she was positive for Amphetamines upon admission but infant wasn't. MOB appeared to be suprised as she stated "Oh he wasn't positive, Im surprised". CSW again reported to MOB that infants UDS was negative. MOB reported that she smoked a "roach" about three-four days ago and "it mush ave been laced with Meth because I smoked some and then put it out because I could tell that something wasn't right". CSW understanding and reported to [facility] the hospital drug screen policy. CSW advised MOB that CSW would still be following up with CPS as MOB reported that she had/has CPS hx and oldest child ([place]) was placed in foster care and is now adopted out, second child Research scientist (life sciences)) is in the care of [employer]) and third child Warden/ranger)  is in her custody  MOB reported that she signed over her rights for first child and then was given a TSP for her second son. MOB reported to CSW "they told me that everytime I have a baby they would be called since I have hx". CSW reported to [facility] that this is found to be true to ensure that MOB and infant is safe. MOB inquired from CSW on if CPS would be taking her child? CSW reported to [facility] that CSW is not sure as CPS handles all cases differently, MOB understanding. CSW did inquired form FOB on if he uses substances and FOB reported that he has been clean for 6 months as he was incarcerated. CSW praised FOB for this as well.    CSW inquired from [facility] on her mental health hx. MOB denies mental health hx aside from anxiety. MOB reported that she was diagnosed years ago and reports that she gets very stressed out at time reason being why she used substances. CSW asked MOB if she felt that she needed substances abuse treatment and MOB reported "Its not like I cant stop, I just use to ease stress. It is controllable". CSW understanding and reported to MOB that if she changed her mind and desired resources to please let CSW know. CSW also offered MOB therapy for anxiety and MOB declined therapy and medication management for anxiety at this time. MOB reported that her mom is her primary support. MOB expressed that at this time she and FOB live in [place] ([place] They report that they have been living there for about 2 weeks while searching for housing. MOB reports that she is on the housing list in [place] currently. MOB reported to this CSW that she has the items needed to care for child with child sleeping in basinet. MOB reported that carseat id to arrive today for infant. MOB reported that she gets Physicist, medical and reported that she may apply fr WIC. CSW offered to assist, MOB declined at this time. At this time MOB denied SI and HI and reported no other needs from this CSW.  CSW still took  time to provide MOB with PPD and SIDS education. MOB was given PPD Checklist in order to keep track of feelings as they relate to PPD.   CSW has followed up with [facility] CPS intake Malachy Chamber. At this time there are barriers to infant discharging with MOB. CSW will also continue to monitor infants CDS and update CPS as needed.      CSW Plan/Description:  Sudden Infant Death Syndrome (  SIDS) Education, Perinatal Mood and Anxiety Disorder (PMADs) Education, CSW Will Continue to Monitor Umbilical Cord Tissue Drug Screen Results and Make Report if [facility], Hospital Drug Screen Policy Information, Child Protective Service Report , CSW Awaiting CPS Disposition Plan    Robb Matar, LCSWA 03/09/2020, 9:46 AM

## 2020-03-09 NOTE — Progress Notes (Signed)
Subjective: Postpartum Day #1: Cesarean Delivery/BTL Patient reports tolerating PO and no problems voiding; denies dizziness, is ambulating in the room during our visit; breastfeeding going well; has changed her mind re circumcision for infant- declines at present  Objective: Vital signs in last 24 hours: Temp:  [97.5 F (36.4 C)-98.7 F (37.1 C)] 98.7 F (37.1 C) (05/20 0429) Pulse Rate:  [55-73] 68 (05/20 0429) Resp:  [12-20] 16 (05/20 0429) BP: (79-119)/(43-70) 95/70 (05/20 0429) SpO2:  [98 %-100 %] 99 % (05/20 0429)  Physical Exam:  General: alert, cooperative and no distress Lochia: appropriate Uterine Fundus: firm Incision: pressure dsg intact; R side open to honeycomb- dry & intact DVT Evaluation: No evidence of DVT seen on physical exam.  Recent Labs    03/08/20 0019  HGB 9.8*  HCT 31.7*    Assessment/Plan: Status post Cesarean section. Doing well postoperatively.  Awaiting SW consult; awaiting results of POD#1 CBC Will start po Fe based on preop Hgb (s/p Feraheme)  Laurie [name] CNM 03/09/2020, 8:44 AM

## 2020-03-09 NOTE — Progress Notes (Signed)
CSW received call back from CPS worker C. Williams. CSW was advised that at this time there are barriers to infant leaving with MOB. C. Williams reported that she is conducting further investigation at this time and will follow up with CSW for further updates.     Aydn Ferrara S. Tatelyn Vanhecke, MSW, LCSW [facility] [place] [phone]   

## 2020-03-10 LAB — RH IG WORKUP (INCLUDES ABO/RH)
ABO/RH(D): A NEG
Fetal Screen: NEGATIVE
Gestational Age(Wks): 41
Unit division: 0

## 2020-03-10 MED ORDER — FERROUS FUMARATE 324 (106 FE) MG PO TABS: 1.0000 | ORAL_TABLET | Freq: Every day | ORAL | 3 refills | Status: DC

## 2020-03-10 MED ORDER — ACETAMINOPHEN 325 MG PO TABS
650.0000 mg | ORAL_TABLET | ORAL | 0 refills | Status: AC | PRN
Start: 2020-03-10 — End: 2021-03-10

## 2020-03-10 MED ORDER — FERROUS SULFATE 325 (65 FE) MG PO TABS: 325.0000 mg | ORAL_TABLET | Freq: Every day | ORAL | 3 refills | Status: AC

## 2020-03-10 MED ORDER — MEASLES, MUMPS & RUBELLA VAC IJ SOLR: 0.5000 mL | Freq: Once | INTRAMUSCULAR | Status: DC

## 2020-03-10 MED ORDER — IBUPROFEN 800 MG PO TABS
800.0000 mg | ORAL_TABLET | Freq: Three times a day (TID) | ORAL | 0 refills | Status: DC | PRN
Start: 2020-03-10 — End: 2023-09-30

## 2020-03-10 MED ORDER — ACETAMINOPHEN 325 MG PO TABS
650.0000 mg | ORAL_TABLET | ORAL | 2 refills | Status: DC | PRN
Start: 2020-03-10 — End: 2020-03-10

## 2020-03-10 MED ORDER — IBUPROFEN 800 MG PO TABS
800.0000 mg | ORAL_TABLET | Freq: Three times a day (TID) | ORAL | 0 refills | Status: DC | PRN
Start: 2020-03-10 — End: 2020-03-10

## 2020-03-10 MED ORDER — OXYCODONE HCL 5 MG PO TABS: 5.0000 mg | ORAL_TABLET | ORAL | 0 refills | Status: DC | PRN

## 2020-03-10 MED FILL — IBUPROFEN 800 MG TAB: 800 | 10 days supply | Qty: 30 | Fill #0

## 2020-03-10 MED FILL — FERROUS SULFATE 325 MG TAB: 325 (65 FE) | 30 days supply | Qty: 30 | Fill #0

## 2020-03-10 MED FILL — ACETAMINOPHEN 325 MG TABS: 325 | 3 days supply | Qty: 30 | Fill #0

## 2020-03-10 MED FILL — oxyCODONE HCL 5 MG TABS: 5 | 3 days supply | Qty: 30 | Fill #0

## 2020-03-10 NOTE — Progress Notes (Signed)
This CSW received call from Pediatrician MD [place] with [facility]. Pediatrician called and reported that MOB's 35-month-old son Laurie [name]) hasn't been being seen in the office for care. Was informed that infant hasn't been seen since around 32 weeks old and last received shots around 2-3 moths old. Also advised that mom has had multiple missed appointments with 59-month-old. Pediatrician also reported that 12 month old hasn't had any immunizations and that mom was suppose to get infant tested for Hep C and has failed to do so. CSW had updated CPS worker C. Mayford Knife of this who reports that she and another Child psychotherapist would be to the hospital to speak with MOB about concerns. CSW was advised that there are still barriers to infant leaving with MOB at this time. CPS worked to call CSW once arrived to the hospital.      Laurie [name]. Laurie [name], MSW, LCSW [facility] [place] [phone]

## 2020-03-10 NOTE — Discharge Instructions (Signed)

## 2020-03-10 NOTE — Progress Notes (Signed)
CSW provided FOB with his paperwork to get dru screen done. CSW also notified MOB and FOB that CFT has now been moved to Monday 03/13/20 at 1pm rather than 11am. MOB and FOB reported that they understood with no other questions.      Taniqua Issa S. Irania Durell, MSW, LCSW [facility] [place] [phone]   

## 2020-03-10 NOTE — Progress Notes (Addendum)
3:13pm-CSW spoke with C. Mayford Knife and was advised that she has spoken with MOB and FOB at bedside. CSW was made aware that there will be a Child Family Team Meeting on Monday at 11am. CSW was advised that Mob and FOB are both aware of this and that meeting will be held at [facility] CPS. CSW updated staff of this. CSW was made aware that MOB is also to get drug screen done prior to meeting in which CSW had provided MOB with paperwork at this time. MOB asked CSW about her leaving the hospital.CSW advised MOB and FOB that since MOB has been discharged, they are allowed to stay and care for infant but will no longer get care. CSW advised them that they can get food while in room but if they leave they must notify staff so that infant can be taken to nursery. MOB reported that she understood this and reported no other concerns. Per CPS,a safety plan has been completed that reports t Mob is able to care for infant while here but must not be under any influence of substances. CSW was also asked by CPS worker C Mayford Knife to have staff document any time that MOB or FOB is caught sleeping with infant as this has been a concern most recently.  This CSW will leave handoff to weekends CSW to follow up with C. Williams in the event that MOB or FOB is caught sleeping with infant. Pediatrician aware that barriers still remain to infant discharging to MOB and FOB at this time.   CSW escorted C. Williams CPS worker to [employer]. Georgie Chard to follow up with CSW once assessment is complete. At this time barriers still remain to infant leaving with MOB.      Claude Manges Ozell Ferrera, MSW, LCSW [facility] [place] [phone]

## 2020-03-13 ENCOUNTER — Telehealth: Payer: Self-pay | Admitting: Obstetrics & Gynecology

## 2020-03-13 NOTE — Telephone Encounter (Signed)

## 2020-03-14 ENCOUNTER — Encounter: Payer: BLUE CROSS/BLUE SHIELD | Admitting: Obstetrics & Gynecology

## 2020-04-11 ENCOUNTER — Telehealth: Payer: Self-pay | Admitting: Women's Health

## 2020-04-11 NOTE — Telephone Encounter (Signed)
Patient voice mail box was full was not able to contact patient. Will go over covid health screening questions tomorrow at check in.

## 2020-04-12 ENCOUNTER — Telehealth: Payer: BLUE CROSS/BLUE SHIELD | Admitting: Women's Health

## 2020-04-25 ENCOUNTER — Other Ambulatory Visit: Payer: Self-pay

## 2020-04-25 ENCOUNTER — Ambulatory Visit (INDEPENDENT_AMBULATORY_CARE_PROVIDER_SITE_OTHER): Payer: Medicaid Other | Admitting: Licensed Clinical Social Worker

## 2020-04-25 DIAGNOSIS — F1520 Other stimulant dependence, uncomplicated: Secondary | ICD-10-CM | POA: Diagnosis not present

## 2020-04-25 DIAGNOSIS — F1210 Cannabis abuse, uncomplicated: Secondary | ICD-10-CM | POA: Diagnosis not present

## 2020-04-25 DIAGNOSIS — F1120 Opioid dependence, uncomplicated: Secondary | ICD-10-CM | POA: Diagnosis not present

## 2020-04-25 DIAGNOSIS — F1410 Cocaine abuse, uncomplicated: Secondary | ICD-10-CM | POA: Diagnosis not present

## 2020-04-25 DIAGNOSIS — F1994 Other psychoactive substance use, unspecified with psychoactive substance-induced mood disorder: Secondary | ICD-10-CM
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
## 2020-04-26 ENCOUNTER — Other Ambulatory Visit (HOSPITAL_COMMUNITY): Payer: Medicaid Other | Attending: Psychiatry | Admitting: Licensed Clinical Social Worker

## 2020-04-26 ENCOUNTER — Encounter (HOSPITAL_COMMUNITY): Payer: Self-pay | Admitting: Medical

## 2020-04-26 VITALS — BP 98/68 | HR 70 | Ht 61.0 in | Wt 142.0 lb

## 2020-04-26 DIAGNOSIS — F419 Anxiety disorder, unspecified: Secondary | ICD-10-CM | POA: Diagnosis not present

## 2020-04-26 DIAGNOSIS — F1421 Cocaine dependence, in remission: Secondary | ICD-10-CM | POA: Insufficient documentation

## 2020-04-26 DIAGNOSIS — Z87898 Personal history of other specified conditions: Secondary | ICD-10-CM

## 2020-04-26 DIAGNOSIS — Z6372 Alcoholism and drug addiction in family: Secondary | ICD-10-CM

## 2020-04-26 DIAGNOSIS — Z98891 History of uterine scar from previous surgery: Secondary | ICD-10-CM | POA: Diagnosis not present

## 2020-04-26 DIAGNOSIS — R451 Restlessness and agitation: Secondary | ICD-10-CM | POA: Diagnosis not present

## 2020-04-26 DIAGNOSIS — F4312 Post-traumatic stress disorder, chronic: Secondary | ICD-10-CM

## 2020-04-26 DIAGNOSIS — Z79899 Other long term (current) drug therapy: Secondary | ICD-10-CM | POA: Diagnosis not present

## 2020-04-26 DIAGNOSIS — F19951 Other psychoactive substance use, unspecified with psychoactive substance-induced psychotic disorder with hallucinations: Secondary | ICD-10-CM

## 2020-04-26 DIAGNOSIS — F418 Other specified anxiety disorders: Secondary | ICD-10-CM

## 2020-04-26 DIAGNOSIS — F989 Unspecified behavioral and emotional disorders with onset usually occurring in childhood and adolescence: Secondary | ICD-10-CM

## 2020-04-26 DIAGNOSIS — F17210 Nicotine dependence, cigarettes, uncomplicated: Secondary | ICD-10-CM | POA: Insufficient documentation

## 2020-04-26 DIAGNOSIS — F1994 Other psychoactive substance use, unspecified with psychoactive substance-induced mood disorder: Secondary | ICD-10-CM

## 2020-04-26 DIAGNOSIS — R4587 Impulsiveness: Secondary | ICD-10-CM | POA: Insufficient documentation

## 2020-04-26 DIAGNOSIS — G4700 Insomnia, unspecified: Secondary | ICD-10-CM | POA: Diagnosis not present

## 2020-04-26 DIAGNOSIS — Z653 Problems related to other legal circumstances: Secondary | ICD-10-CM | POA: Diagnosis not present

## 2020-04-26 DIAGNOSIS — F329 Major depressive disorder, single episode, unspecified: Secondary | ICD-10-CM | POA: Insufficient documentation

## 2020-04-26 DIAGNOSIS — F1990 Other psychoactive substance use, unspecified, uncomplicated: Secondary | ICD-10-CM

## 2020-04-26 DIAGNOSIS — Z62810 Personal history of physical and sexual abuse in childhood: Secondary | ICD-10-CM | POA: Diagnosis not present

## 2020-04-26 DIAGNOSIS — R450 Nervousness: Secondary | ICD-10-CM | POA: Diagnosis not present

## 2020-04-26 DIAGNOSIS — B182 Chronic viral hepatitis C: Secondary | ICD-10-CM

## 2020-04-26 DIAGNOSIS — F1520 Other stimulant dependence, uncomplicated: Secondary | ICD-10-CM | POA: Insufficient documentation

## 2020-04-26 DIAGNOSIS — F1920 Other psychoactive substance dependence, uncomplicated: Secondary | ICD-10-CM | POA: Insufficient documentation

## 2020-04-26 DIAGNOSIS — Z720 Tobacco use: Secondary | ICD-10-CM

## 2020-04-26 DIAGNOSIS — Z639 Problem related to primary support group, unspecified: Secondary | ICD-10-CM

## 2020-04-26 MED ORDER — PROPRANOLOL HCL 20 MG PO TABS: 20.0000 mg | ORAL_TABLET | Freq: Three times a day (TID) | ORAL | 3 refills | Status: DC

## 2020-04-26 MED ORDER — BUPROPION HCL ER (SR) 150 MG PO TB12
150.0000 mg | ORAL_TABLET | Freq: Two times a day (BID) | ORAL | 2 refills | Status: DC
Start: 2020-04-26 — End: 2023-09-30

## 2020-04-26 MED ORDER — NALTREXONE HCL 50 MG PO TABS: 50.0000 mg | ORAL_TABLET | Freq: Every day | ORAL | 1 refills | Status: AC
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
## 2020-04-27 ENCOUNTER — Encounter (HOSPITAL_COMMUNITY): Payer: Medicaid Other

## 2020-04-28 NOTE — Progress Notes (Signed)
° ° °  Daily Group Progress Note  Program: CD-IOP   Group Time: 1-2:30pm Participation Level: Active  Behavioral Response: Appropriate Type of Therapy: Process Group   Topic: Clinician checked in with group members, assessing for SI/HI/psychosis and overall level of functioning, including cravings, relapse, and participation in community support meetings. Clinician to identify and process highs and lows since last group. Clinician and group members read Just for Today and Daily Reflection and discussed results of decisions based on fear/anxiety or unrealistic expectations.  Group Time: 2:30-4pm Participation Level: Active Behavioral Response: Appropriate Type of Therapy: Psycho-educational Group   Clinician presented Addiction Neuroscience 101 video from [facility]. Clinician and group members discussed scientific link between addiction, dopamine, and motivation. Clinician and group members discussed examples of changes in motivation levels in their own recovery. Clinician and group members brain stormed personal activities to engage in which increase moments of joy, stimulating the pleasure center of the brain. Clinician reviewed diagnosis criteria and decision fatigue based on information from video. Group members identified Yanes care activity to support recovery to be completed over the weekend.  Group celebrated one [employer] graduation.   Summary: Client came in late with problems of transportation. Client did engage in group discussions when prompted but was concerned with speaking with provider and anxious about medications running out. Client shared stress with family members and DSS involvement as motivation to achieve and maintain sobriety. Client reported a sobriety date of 03/23/20 with no NA meetings attended yet. Client left group early citing feeling sick.   Family Program: Family present? No   Name of family member(s): none. Client reports getting a ride  last minute with her children's father  UDS collected: Yes Results: pending  AA/NA attended?: No  Sponsor?: No   Harlon Ditty, LCSW

## 2020-05-01 ENCOUNTER — Other Ambulatory Visit: Payer: Self-pay

## 2020-05-01 ENCOUNTER — Other Ambulatory Visit (HOSPITAL_COMMUNITY): Payer: Medicaid Other | Admitting: Licensed Clinical Social Worker

## 2020-05-01 DIAGNOSIS — F1210 Cannabis abuse, uncomplicated: Secondary | ICD-10-CM

## 2020-05-01 DIAGNOSIS — F329 Major depressive disorder, single episode, unspecified: Secondary | ICD-10-CM | POA: Diagnosis not present

## 2020-05-01 DIAGNOSIS — F17210 Nicotine dependence, cigarettes, uncomplicated: Secondary | ICD-10-CM | POA: Diagnosis not present

## 2020-05-01 DIAGNOSIS — F1120 Opioid dependence, uncomplicated: Secondary | ICD-10-CM

## 2020-05-01 DIAGNOSIS — Z79899 Other long term (current) drug therapy: Secondary | ICD-10-CM | POA: Diagnosis not present

## 2020-05-01 DIAGNOSIS — Z62810 Personal history of physical and sexual abuse in childhood: Secondary | ICD-10-CM | POA: Diagnosis not present

## 2020-05-01 DIAGNOSIS — R4587 Impulsiveness: Secondary | ICD-10-CM | POA: Diagnosis not present

## 2020-05-01 DIAGNOSIS — B182 Chronic viral hepatitis C: Secondary | ICD-10-CM | POA: Diagnosis not present

## 2020-05-01 DIAGNOSIS — Z653 Problems related to other legal circumstances: Secondary | ICD-10-CM | POA: Diagnosis not present

## 2020-05-01 DIAGNOSIS — F419 Anxiety disorder, unspecified: Secondary | ICD-10-CM | POA: Diagnosis not present

## 2020-05-01 DIAGNOSIS — R451 Restlessness and agitation: Secondary | ICD-10-CM | POA: Diagnosis not present

## 2020-05-01 DIAGNOSIS — F1994 Other psychoactive substance use, unspecified with psychoactive substance-induced mood disorder: Secondary | ICD-10-CM

## 2020-05-01 DIAGNOSIS — F1520 Other stimulant dependence, uncomplicated: Secondary | ICD-10-CM

## 2020-05-01 DIAGNOSIS — G4700 Insomnia, unspecified: Secondary | ICD-10-CM | POA: Diagnosis not present

## 2020-05-01 DIAGNOSIS — R450 Nervousness: Secondary | ICD-10-CM | POA: Diagnosis not present

## 2020-05-01 DIAGNOSIS — F4312 Post-traumatic stress disorder, chronic: Secondary | ICD-10-CM | POA: Diagnosis not present

## 2020-05-01 DIAGNOSIS — F418 Other specified anxiety disorders: Secondary | ICD-10-CM | POA: Diagnosis not present

## 2020-05-01 DIAGNOSIS — F4310 Post-traumatic stress disorder, unspecified: Secondary | ICD-10-CM

## 2020-05-02 NOTE — Progress Notes (Signed)
    Daily Group Progress Note  Program: CD-IOP   Group Time: 1pm-2:30pm  Participation Level: Active  Behavioral Response: Appropriate  Type of Therapy: Process Group  Topic: Clinician met with group, assessing for SI/HI/psychosis and overall level of functioning. Clinician and group members discussed recent highs/lows and any struggles with triggers or cravings. Clinician and group members read and processed AA Daily Reflection and NA Just for Today Readings on patience and humility and how this applies to current place in recovery. Clinician and group members processed struggles with instant gratification and family expectation of change. Clinician and group members processed difficulty of returning from residential treatment and that 'just being sober didn't fix everything.' Clinician and group members processed addressing automatic 'addict' thoughts as responses to difficult situations and normalized re-framing thoughts and opposite action behaviors as healthy responses. Clinician and group members explored what to look for in a sponsor.   Group Time: 2:30pm-4pm  Participation Level: Active  Behavioral Response: Appropriate  Type of Therapy: Psycho-education Group  Topic: Clinician presented the psycho-educational topic of Adult Children of Alcoholics (ACOA). Clinician reviewed with clients the 'Laundry List' and common thought and behavior patterns of families with substance use in the home. Clinician and group members discussed how this affected believes/behaviors in work, intimate and interpersonal relationships. Clinician and group members discussed specifically the importance or lack of importance of knowing intentions of behaviors. Clinician provided clients with '10 Things Adult Children of Addicts Wants You To Know."    Summary: Laurie [name] reported sobriety date of 23 March 2012 however did note later in session she smoked 'one hit' of vape with [facility] after returning from treatment.  Client notes this did cause anxiety and paranoia and she is now certain about wanting to maintain sobriety from all substances, not just her DOC. Client reported she has not attended any NA meetings yet due to being very busy with family. Client processed with group frustration in feeling unsupported while attempting to change behaviors by family and DSS. Client processed additional feelings with lack of immediate gratification and all needed tasks being completed immediately, though was able to identify everything is a process and will take time. Client was able to identify personal ACOA traits and processed in session concern about how her behaviors in active addiction can effect her children. Client was receptive to support from other group members on being able to address family patterns and create healthier relationship with own children. Client discussed need for alternate housing arrangements to decrease stress level as she has identified relationship with mother as a trigger.    Family Program: Family present? No   Name of family member(s): NA  UDS collected: Yes Results: no substances found; client reports surprised THC did not show  AA/NA attended?: No  Sponsor?: No   Olegario Messier, LCSW

## 2020-05-03 ENCOUNTER — Encounter (HOSPITAL_COMMUNITY): Payer: Medicaid Other

## 2020-05-04 ENCOUNTER — Telehealth (HOSPITAL_COMMUNITY): Payer: Self-pay | Admitting: Licensed Clinical Social Worker

## 2020-05-04 ENCOUNTER — Encounter (HOSPITAL_COMMUNITY): Payer: Medicaid Other

## 2020-05-04 NOTE — Telephone Encounter (Signed)
Client did not attend scheduled CDIOP group. Clinician outreach at listed phone number. No answer, unable to leave voicemail.

## 2020-05-08 ENCOUNTER — Encounter (HOSPITAL_COMMUNITY): Payer: Medicaid Other

## 2020-05-09 ENCOUNTER — Telehealth (HOSPITAL_COMMUNITY): Payer: Self-pay | Admitting: Licensed Clinical Social Worker

## 2020-05-10 ENCOUNTER — Encounter (HOSPITAL_COMMUNITY): Payer: Medicaid Other

## 2020-05-10 ENCOUNTER — Telehealth (HOSPITAL_COMMUNITY): Payer: Self-pay | Admitting: Licensed Clinical Social Worker

## 2020-05-11 ENCOUNTER — Encounter (HOSPITAL_COMMUNITY): Payer: Medicaid Other

## 2020-05-11 ENCOUNTER — Telehealth (HOSPITAL_COMMUNITY): Payer: Self-pay | Admitting: Licensed Clinical Social Worker

## 2020-05-15 ENCOUNTER — Encounter (HOSPITAL_COMMUNITY): Payer: Medicaid Other

## 2020-05-17 ENCOUNTER — Encounter (HOSPITAL_COMMUNITY): Payer: Self-pay | Admitting: Medical

## 2020-05-17 ENCOUNTER — Ambulatory Visit (INDEPENDENT_AMBULATORY_CARE_PROVIDER_SITE_OTHER): Payer: Medicaid Other | Admitting: Medical

## 2020-05-17 DIAGNOSIS — F418 Other specified anxiety disorders: Secondary | ICD-10-CM

## 2020-05-17 DIAGNOSIS — Z9111 Patient's noncompliance with dietary regimen: Secondary | ICD-10-CM

## 2020-05-17 DIAGNOSIS — F1421 Cocaine dependence, in remission: Secondary | ICD-10-CM

## 2020-05-17 DIAGNOSIS — F4310 Post-traumatic stress disorder, unspecified: Secondary | ICD-10-CM

## 2020-05-17 DIAGNOSIS — F1990 Other psychoactive substance use, unspecified, uncomplicated: Secondary | ICD-10-CM

## 2020-05-17 DIAGNOSIS — R450 Nervousness: Secondary | ICD-10-CM | POA: Diagnosis not present

## 2020-05-17 DIAGNOSIS — F1520 Other stimulant dependence, uncomplicated: Secondary | ICD-10-CM

## 2020-05-17 DIAGNOSIS — G4700 Insomnia, unspecified: Secondary | ICD-10-CM | POA: Diagnosis not present

## 2020-05-17 DIAGNOSIS — F419 Anxiety disorder, unspecified: Secondary | ICD-10-CM | POA: Diagnosis not present

## 2020-05-17 DIAGNOSIS — F329 Major depressive disorder, single episode, unspecified: Secondary | ICD-10-CM | POA: Diagnosis not present

## 2020-05-17 DIAGNOSIS — Z62810 Personal history of physical and sexual abuse in childhood: Secondary | ICD-10-CM | POA: Diagnosis not present

## 2020-05-17 DIAGNOSIS — Z653 Problems related to other legal circumstances: Secondary | ICD-10-CM | POA: Diagnosis not present

## 2020-05-17 DIAGNOSIS — Z720 Tobacco use: Secondary | ICD-10-CM

## 2020-05-17 DIAGNOSIS — R451 Restlessness and agitation: Secondary | ICD-10-CM | POA: Diagnosis not present

## 2020-05-17 DIAGNOSIS — Z98891 History of uterine scar from previous surgery: Secondary | ICD-10-CM

## 2020-05-17 DIAGNOSIS — F4312 Post-traumatic stress disorder, chronic: Secondary | ICD-10-CM

## 2020-05-17 DIAGNOSIS — Z639 Problem related to primary support group, unspecified: Secondary | ICD-10-CM

## 2020-05-17 DIAGNOSIS — F1994 Other psychoactive substance use, unspecified with psychoactive substance-induced mood disorder: Secondary | ICD-10-CM

## 2020-05-17 DIAGNOSIS — F989 Unspecified behavioral and emotional disorders with onset usually occurring in childhood and adolescence: Secondary | ICD-10-CM

## 2020-05-17 DIAGNOSIS — F17210 Nicotine dependence, cigarettes, uncomplicated: Secondary | ICD-10-CM | POA: Diagnosis not present

## 2020-05-17 DIAGNOSIS — F1120 Opioid dependence, uncomplicated: Secondary | ICD-10-CM | POA: Diagnosis not present

## 2020-05-17 DIAGNOSIS — F19951 Other psychoactive substance use, unspecified with psychoactive substance-induced psychotic disorder with hallucinations: Secondary | ICD-10-CM

## 2020-05-17 DIAGNOSIS — Z87898 Personal history of other specified conditions: Secondary | ICD-10-CM

## 2020-05-17 DIAGNOSIS — F1920 Other psychoactive substance dependence, uncomplicated: Secondary | ICD-10-CM | POA: Diagnosis not present

## 2020-05-17 DIAGNOSIS — F1210 Cannabis abuse, uncomplicated: Secondary | ICD-10-CM

## 2020-05-17 DIAGNOSIS — B182 Chronic viral hepatitis C: Secondary | ICD-10-CM | POA: Diagnosis not present

## 2020-05-17 DIAGNOSIS — Z79899 Other long term (current) drug therapy: Secondary | ICD-10-CM | POA: Diagnosis not present

## 2020-05-17 DIAGNOSIS — R4587 Impulsiveness: Secondary | ICD-10-CM | POA: Diagnosis not present

## 2020-05-17 DIAGNOSIS — Z6372 Alcoholism and drug addiction in family: Secondary | ICD-10-CM
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
## 2020-05-18 ENCOUNTER — Encounter (HOSPITAL_COMMUNITY): Payer: Medicaid Other

## 2020-05-22 ENCOUNTER — Encounter (HOSPITAL_COMMUNITY): Payer: Medicaid Other

## 2020-05-24 ENCOUNTER — Encounter (HOSPITAL_COMMUNITY): Payer: Medicaid Other

## 2020-05-25 ENCOUNTER — Encounter (HOSPITAL_COMMUNITY): Payer: Medicaid Other

## 2020-05-29 ENCOUNTER — Encounter (HOSPITAL_COMMUNITY): Payer: Medicaid Other

## 2020-05-31 ENCOUNTER — Encounter (HOSPITAL_COMMUNITY): Payer: Medicaid Other

## 2020-06-01 ENCOUNTER — Encounter (HOSPITAL_COMMUNITY): Payer: Medicaid Other

## 2021-06-24 ENCOUNTER — Encounter (HOSPITAL_COMMUNITY): Payer: Self-pay | Admitting: *Deleted

## 2021-06-24 ENCOUNTER — Emergency Department (HOSPITAL_COMMUNITY)
Admission: EM | Admit: 2021-06-24 | Discharge: 2021-06-25 | Disposition: A | Discharge status: Home / Self Care | Payer: Medicaid Other | Attending: Emergency Medicine | Admitting: Emergency Medicine

## 2021-06-24 ENCOUNTER — Other Ambulatory Visit: Payer: Self-pay

## 2021-06-24 ENCOUNTER — Ambulatory Visit (HOSPITAL_COMMUNITY)
Admission: EM | Admit: 2021-06-24 | Discharge: 2021-06-24 | Disposition: A | Discharge status: Home / Self Care | Payer: Medicaid Other

## 2021-06-24 DIAGNOSIS — F1910 Other psychoactive substance abuse, uncomplicated: Secondary | ICD-10-CM | POA: Insufficient documentation

## 2021-06-24 DIAGNOSIS — F17210 Nicotine dependence, cigarettes, uncomplicated: Secondary | ICD-10-CM | POA: Diagnosis not present

## 2021-06-24 DIAGNOSIS — J45909 Unspecified asthma, uncomplicated: Secondary | ICD-10-CM | POA: Insufficient documentation

## 2021-06-24 NOTE — ED Triage Notes (Signed)
Pt used crack and crystal meth last night; reports since using has felt paranoid. Pt denies si/hi/reports she was having hallucinations, currently denies hallucinations at present. Feels she was laced last night; reports she regularly uses; however doesn't feel this way typically.

## 2021-06-24 NOTE — ED Notes (Signed)
Pt walked out of dept. With family after initial assessment.  A&O x 4, gait steady, no distress noted.

## 2021-06-24 NOTE — BH Assessment (Signed)
Per Amy NT, Pt walked out before seen by Provider. Pt refused to signed the MSE.

## 2021-06-24 NOTE — BH Assessment (Signed)
Laurie [name], Urgent MR # [id number]; 25 years old presents this date with her grandfather Georgette Shell, [phone].  Pt reports that she have been using Heroin, Methamphetamines and Fetanyl today.  Pt reports SI, AVH.  Pt denied HI.  Pt denies any prior MH diagnosis or prescribed medication for symptom management.  Pt refused to signed MSE, TTS signed the form.

## 2021-06-25 MED ORDER — ONDANSETRON 4 MG PO TBDP
4.0000 mg | ORAL_TABLET | Freq: Once | ORAL | Status: AC
  Administered 2021-06-25: 4 mg via ORAL
  Filled 2021-06-25: qty 1

## 2021-06-25 MED ORDER — ONDANSETRON 4 MG PO TBDP: 4.0000 mg | ORAL_TABLET | Freq: Three times a day (TID) | ORAL | 0 refills | Status: DC | PRN

## 2021-06-25 NOTE — Discharge Instructions (Addendum)
Thank you for allowing me to care for you today in the Emergency Department.   Follow-up with the inpatient and outpatient resources for drug rehabilitation on your paperwork.  Let 1 tablet of Zofran dissolve in your tongue every 8 hours as needed for nausea or vomiting.  Return to the emergency department if you have thoughts of wanting to harm or kill yourself, harming or killing anyone else, uncontrollable vomiting, if you start having persistent fevers, temperature of 100.4 F or higher, difficulty breathing, or other new, concerning symptoms.

## 2021-06-25 NOTE — ED Notes (Signed)
Pt would only minimally interact w/ RN. RN medicated pt and reviewed discharge/follow up instructions including resources for treatment.  Pt did get into wheelchair and RN wheeled her to waiting room to wait for visitor that went to get the car.  Pt refused temperature.

## 2021-06-25 NOTE — ED Notes (Signed)
Pt and visitor in bed.  RN attempted to engage pt however she opened her eyes and looked at RN then closed her eyes again.  Pt would not engage RN.

## 2021-06-25 NOTE — ED Provider Notes (Signed)
[facility] EMERGENCY DEPARTMENT Provider Note   CSN: [id number] Arrival date & time: 06/24/21  2142     History Chief Complaint  Patient presents with   Drug Problem    Laurie [name] is a 25 y.o. female with a history of polysubstance use disorder, cannabis use disorder, cocaine use disorder, methamphetamine use disorder, opioid use disorder, hepatitis C, asthma, anxiety who presents the emergency department with a chief complaint of "I think I'm in withdrawal."   History is limited does not wish to participate in discussion.  Patient states that she is unsure why she is at the hospital.  She denies SI, HI.  She reports using heroin, fentanyl earlier today.  I did review the patient's chart and also reports that she was using methamphetamine.  She is accompanied to the emergency department by a friend.  Triage note reports that the patient was having hallucinations that had previously resolved.  She denies auditory or visual hallucinations to me.  She denies fever, chills, chest pain, shortness of breath, abdominal pain, vomiting, dizziness, lightheadedness, syncope, seizure-like activity, or other complaints at this time.  She states that she feels like she might be going into " mad withdrawal", but would like some outpatient resources for drug rehabilitation. She has no other complaints at this time.  The history is provided by the patient and medical records. No language interpreter was used.      Past Medical History:  Diagnosis Date   Anxiety    Asthma    "grew out"   Chlamydia    Depression    doing really good   Gestational diabetes    with first two pregnancies   Hepatitis C June 2016   History of heroin abuse (HCC)    Infection    UTI   Polysubstance abuse (HCC) 05/28/2018   UDS positive for cocaine, MJ, meth, opioids   Type A blood, Rh negative     Patient Active Problem List   Diagnosis Date Noted   No prenatal care in current pregnancy  03/08/2020   History of cesarean section 03/08/2020   Status post repeat low transverse cesarean section 03/08/2020   Late prenatal care in third trimester 12/20/2019   Encounter for supervision of other normal pregnancy, unspecified trimester 12/20/2019   Gestational thrombocytopenia (HCC) 12/20/2016   Tobacco abuse 11/25/2016   Rh negative state in antepartum period 11/25/2016   Polysubstance abuse (HCC) 08/31/2015   Hepatitis C 03/22/2015    Past Surgical History:  Procedure Laterality Date   CESAREAN SECTION N/A 01/11/2016   Procedure: CESAREAN SECTION;  Surgeon: Brock Bad, MD;  Location: [facility] ORS;  Service: Obstetrics;  Laterality: N/A;   CESAREAN SECTION N/A 12/21/2016   Procedure: REPEAT CESAREAN SECTION;  Surgeon: Adam Phenix, MD;  Location: [facility] BIRTHING SUITES;  Service: Obstetrics;  Laterality: N/A;   CESAREAN SECTION N/A 06/05/2018   Procedure: REPEAT CESAREAN SECTION;  Surgeon: [name], MD;  Location: [facility] BIRTHING SUITES;  Service: Obstetrics;  Laterality: N/A;   CESAREAN SECTION WITH BILATERAL TUBAL LIGATION Bilateral 03/08/2020   Procedure: CESAREAN SECTION WITH BILATERAL TUBAL LIGATION;  Surgeon: Kathrynn Running, MD;  Location: [facility] LD ORS;  Service: Obstetrics;  Laterality: Bilateral;     OB History     Gravida  5   Para  4   Term  3   Preterm  1   AB  1   Living  4      SAB  1   IAB  0   Ectopic  0   Multiple  0   Live Births  4           Family History  Problem Relation Age of Onset   Diabetes Mother    Pulmonary embolism Mother    Stroke Mother    Cancer Paternal Grandmother        liver   Epilepsy Brother     Social History   Tobacco Use   Smoking status: Some Days    Packs/day: 0.25    Types: Cigarettes   Smokeless tobacco: Never   Tobacco comments:    4 cigs per day  Vaping Use   Vaping Use: Never used  Substance Use Topics   Alcohol use: Not Currently    Comment: occasionally   Drug use: Not Currently     Types: Marijuana, Heroin, Other-see comments    Comment: 05/28/18 +UDS for meth, cocaine, opioids, MJ;  heroin 3 years ago    Home Medications Prior to Admission medications   Medication Sig Start Date End Date Taking? Authorizing Provider  ondansetron (ZOFRAN ODT) 4 MG disintegrating tablet Take 1 tablet (4 mg total) by mouth every 8 (eight) hours as needed. 06/25/21  Yes Jmichael Gille A, PA-C  buPROPion (WELLBUTRIN SR) 150 MG 12 hr tablet Take 1 tablet (150 mg total) by mouth 2 (two) times daily. 04/26/20 04/26/21  Court Joy, PA-C  calcium carbonate (TUMS - DOSED IN MG ELEMENTAL CALCIUM) 500 MG chewable tablet Chew 1-2 tablets by mouth as needed for indigestion or heartburn.    [provider]  ferrous sulfate 325 (65 FE) MG tablet Take 1 tablet (325 mg total) by mouth daily. 03/10/20 07/08/20  Mirian Mo, MD  hydrOXYzine (VISTARIL) 50 MG capsule Take 50 mg by mouth 3 (three) times daily as needed.    [provider]  ibuprofen (ADVIL) 800 MG tablet Take 1 tablet (800 mg total) by mouth every 8 (eight) hours as needed. 03/10/20   Sparacino, Hailey L, DO  Prenatal Vit-Fe Fumarate-FA (PRENATAL MULTIVITAMIN) TABS tablet Take 1 tablet by mouth daily at 12 noon. 06/08/18   Mirian Mo, MD  propranolol (INDERAL) 20 MG tablet Take 1 tablet (20 mg total) by mouth 3 (three) times daily. .Take 1-2 tabs  with onset of anxiety panic 04/26/20   Court Joy, PA-C    Allergies    Keflex [cephalexin] and Penicillins  Review of Systems   Review of Systems  Constitutional:  Negative for activity change, chills, diaphoresis and fever.  HENT:  Negative for congestion and sore throat.   Respiratory:  Negative for cough, shortness of breath and wheezing.   Cardiovascular:  Negative for chest pain and palpitations.  Gastrointestinal:  Positive for nausea. Negative for abdominal pain, blood in stool, constipation, diarrhea and vomiting.  Genitourinary:  Negative for dysuria, frequency and  urgency.  Musculoskeletal:  Negative for back pain, myalgias, neck pain and neck stiffness.  Skin:  Negative for rash.  Allergic/Immunologic: Negative for immunocompromised state.  Neurological:  Negative for seizures, syncope, weakness, numbness and headaches.  Psychiatric/Behavioral:  Positive for hallucinations (resolved). Negative for confusion and suicidal ideas.    Physical Exam Updated Vital Signs BP 102/69   Pulse (!) 59   Temp 98.8 F (37.1 C) (Oral)   Resp 18   LMP 06/24/2021   SpO2 100%   Physical Exam Vitals and nursing note reviewed.  Constitutional:      General: She is  not in acute distress.    Comments: Patient actively falling asleep in the middle of the conversation.  She rouses to voice, but has limited participation and history or physical exam.  No hypoxia.  Normal respiration rate.  HENT:     Head: Normocephalic.  Eyes:     Conjunctiva/sclera: Conjunctivae normal.  Cardiovascular:     Rate and Rhythm: Normal rate and regular rhythm.     Heart sounds: No murmur heard.   No friction rub. No gallop.  Pulmonary:     Effort: Pulmonary effort is normal. No respiratory distress.     Breath sounds: No stridor. No wheezing, rhonchi or rales.  Chest:     Chest wall: No tenderness.  Abdominal:     General: There is no distension.     Palpations: Abdomen is soft. There is no mass.     Tenderness: There is no abdominal tenderness. There is no right CVA tenderness, left CVA tenderness, guarding or rebound.     Hernia: No hernia is present.  Musculoskeletal:     Cervical back: Neck supple.     Right lower leg: No edema.     Left lower leg: No edema.  Skin:    General: Skin is warm.     Findings: No rash.  Neurological:     Mental Status: She is alert.  Psychiatric:        Behavior: Behavior normal.     Comments: No SI, HI, or auditory visual hallucinations.    ED Results / Procedures / Treatments   Labs (all labs ordered are listed, but only abnormal  results are displayed) Labs Reviewed - No data to display  EKG None  Radiology No results found.  Procedures Procedures   Medications Ordered in ED Medications  ondansetron (ZOFRAN-ODT) disintegrating tablet 4 mg (4 mg Oral Given 06/25/21 0136)    ED Course  I have reviewed the triage vital signs and the nursing notes.  Pertinent labs & imaging results that were available during my care of the patient were reviewed by me and considered in my medical decision making (see chart for details).    MDM Rules/Calculators/A&P                           25 year old female with a history of polysubstance use disorder, cannabis use disorder, cocaine use disorder, methamphetamine use disorder, opioid use disorder, hepatitis C, asthma, anxiety who presents the emergency department for unclear reasons.  Triage note said that the patient was previously having hallucinations, but reports that these had resolved, after drug use last night.  Patient reports that she has been using fentanyl heroin.  She also presented to [facility] earlier today and then left before she was evaluated and the triage note also indicated that she had been using methamphetamine.  On my evaluation, the patient has no complaints.  She refuses to say why that she is at the emergency department.  When asked if she was requesting resources for drug rehabilitation, she initially said no and that she was in "mad withdrawal".  On exam, she had no signs or symptoms of opioid or amphetamine withdrawal.  She denied alcohol use.  Vital signs were normal.  She did have 1 episode of retching, but no vomiting.  Zofran given.  I have a low suspicion for bacteremia as she is having no fever.  Initially, patient denies requesting resources for rehabilitation facilities.  She is later agreeable.  Both inpatient and outpatient resources given.  Will discharge home with Zofran.  She adamantly continues to deny SI, HI, and auditory visual  hallucinations.  At this time, no further urgent or emergent work-up is indicated.  ER return precautions given.  Safer discharge home with outpatient follow-up as discussed.  Final Clinical Impression(s) / ED Diagnoses Final diagnoses:  Polysubstance abuse (HCC)    Rx / DC Orders ED Discharge Orders          Ordered    ondansetron (ZOFRAN ODT) 4 MG disintegrating tablet  Every 8 hours PRN        06/25/21 0127             Jaylani Mcguinn A, PA-C 06/25/21 0214    Gwyneth Sprout, MD 06/25/21 [phone]

## 2023-09-12 ENCOUNTER — Ambulatory Visit
Admission: EM | Admit: 2023-09-12 | Discharge: 2023-09-12 | Disposition: A | Discharge status: Home / Self Care | Payer: MEDICAID

## 2023-09-12 DIAGNOSIS — L0291 Cutaneous abscess, unspecified: Secondary | ICD-10-CM

## 2023-09-12 HISTORY — DX: Homelessness unspecified: Z59.00

## 2023-09-12 NOTE — ED Notes (Signed)
Patient is being discharged from the Urgent Care and sent to the Emergency Department via POV. Per [place], [place], patient is in need of higher level of care due to abscess. Patient is aware and verbalizes understanding of plan of care.  Vitals:   09/12/23 1258  BP: 98/68  Pulse: 88  Resp: 18  Temp: 98.2 F (36.8 C)  SpO2: 99%

## 2023-09-12 NOTE — ED Triage Notes (Signed)
"  I have some abscess' on my arms and it's gotten to the point were these areas are very infected". "The worse infection and abscess is on my left wrist but now I notice some on my right arm". No fever.

## 2023-09-12 NOTE — ED Provider Notes (Signed)
Patient presents today for evaluation of significant abscesses to bilateral arms. She reports subjective fever. Given large area/ multiple areas of infection, concerns for IV drug use recommended further evaluation in the ED for stat labs and imaging.    Tomi Bamberger, PA-C 09/12/23 1309

## 2023-09-14 ENCOUNTER — Inpatient Hospital Stay (HOSPITAL_COMMUNITY)
Admission: EM | Admit: 2023-09-14 | Discharge: 2023-09-30 | DRG: 486 | Disposition: A | Discharge status: Home / Self Care | Payer: MEDICAID | Attending: Family Medicine | Admitting: Family Medicine

## 2023-09-14 DIAGNOSIS — I76 Septic arterial embolism: Secondary | ICD-10-CM | POA: Diagnosis present

## 2023-09-14 DIAGNOSIS — Z7901 Long term (current) use of anticoagulants: Secondary | ICD-10-CM

## 2023-09-14 DIAGNOSIS — Z79899 Other long term (current) drug therapy: Secondary | ICD-10-CM

## 2023-09-14 DIAGNOSIS — Z5982 Transportation insecurity: Secondary | ICD-10-CM

## 2023-09-14 DIAGNOSIS — L03113 Cellulitis of right upper limb: Secondary | ICD-10-CM | POA: Diagnosis present

## 2023-09-14 DIAGNOSIS — F1123 Opioid dependence with withdrawal: Secondary | ICD-10-CM | POA: Diagnosis present

## 2023-09-14 DIAGNOSIS — F1510 Other stimulant abuse, uncomplicated: Secondary | ICD-10-CM | POA: Diagnosis present

## 2023-09-14 DIAGNOSIS — Z5941 Food insecurity: Secondary | ICD-10-CM

## 2023-09-14 DIAGNOSIS — Z560 Unemployment, unspecified: Secondary | ICD-10-CM

## 2023-09-14 DIAGNOSIS — Z832 Family history of diseases of the blood and blood-forming organs and certain disorders involving the immune mechanism: Secondary | ICD-10-CM

## 2023-09-14 DIAGNOSIS — A419 Sepsis, unspecified organism: Secondary | ICD-10-CM

## 2023-09-14 DIAGNOSIS — I82621 Acute embolism and thrombosis of deep veins of right upper extremity: Secondary | ICD-10-CM | POA: Diagnosis present

## 2023-09-14 DIAGNOSIS — F1990 Other psychoactive substance use, unspecified, uncomplicated: Secondary | ICD-10-CM

## 2023-09-14 DIAGNOSIS — R7881 Bacteremia: Secondary | ICD-10-CM | POA: Diagnosis present

## 2023-09-14 DIAGNOSIS — F32A Depression, unspecified: Secondary | ICD-10-CM | POA: Diagnosis present

## 2023-09-14 DIAGNOSIS — F419 Anxiety disorder, unspecified: Secondary | ICD-10-CM | POA: Diagnosis present

## 2023-09-14 DIAGNOSIS — Z833 Family history of diabetes mellitus: Secondary | ICD-10-CM

## 2023-09-14 DIAGNOSIS — Z880 Allergy status to penicillin: Secondary | ICD-10-CM

## 2023-09-14 DIAGNOSIS — F17210 Nicotine dependence, cigarettes, uncomplicated: Secondary | ICD-10-CM | POA: Diagnosis present

## 2023-09-14 DIAGNOSIS — F4310 Post-traumatic stress disorder, unspecified: Secondary | ICD-10-CM | POA: Diagnosis present

## 2023-09-14 DIAGNOSIS — L02413 Cutaneous abscess of right upper limb: Secondary | ICD-10-CM | POA: Diagnosis present

## 2023-09-14 DIAGNOSIS — E860 Dehydration: Secondary | ICD-10-CM | POA: Diagnosis present

## 2023-09-14 DIAGNOSIS — B1920 Unspecified viral hepatitis C without hepatic coma: Secondary | ICD-10-CM | POA: Diagnosis present

## 2023-09-14 DIAGNOSIS — F1210 Cannabis abuse, uncomplicated: Secondary | ICD-10-CM | POA: Diagnosis present

## 2023-09-14 DIAGNOSIS — B955 Unspecified streptococcus as the cause of diseases classified elsewhere: Secondary | ICD-10-CM

## 2023-09-14 DIAGNOSIS — Z888 Allergy status to other drugs, medicaments and biological substances status: Secondary | ICD-10-CM

## 2023-09-14 DIAGNOSIS — R509 Fever, unspecified: Secondary | ICD-10-CM

## 2023-09-14 DIAGNOSIS — B182 Chronic viral hepatitis C: Secondary | ICD-10-CM | POA: Diagnosis present

## 2023-09-14 DIAGNOSIS — Z820 Family history of epilepsy and other diseases of the nervous system: Secondary | ICD-10-CM

## 2023-09-14 DIAGNOSIS — R7401 Elevation of levels of liver transaminase levels: Secondary | ICD-10-CM

## 2023-09-14 DIAGNOSIS — E041 Nontoxic single thyroid nodule: Secondary | ICD-10-CM | POA: Insufficient documentation

## 2023-09-14 DIAGNOSIS — M00261 Other streptococcal arthritis, right knee: Principal | ICD-10-CM | POA: Diagnosis present

## 2023-09-14 DIAGNOSIS — I378 Other nonrheumatic pulmonary valve disorders: Secondary | ICD-10-CM | POA: Diagnosis present

## 2023-09-14 DIAGNOSIS — F1410 Cocaine abuse, uncomplicated: Secondary | ICD-10-CM | POA: Diagnosis present

## 2023-09-14 DIAGNOSIS — M009 Pyogenic arthritis, unspecified: Secondary | ICD-10-CM

## 2023-09-14 DIAGNOSIS — L02419 Cutaneous abscess of limb, unspecified: Secondary | ICD-10-CM | POA: Insufficient documentation

## 2023-09-14 DIAGNOSIS — A689 Relapsing fever, unspecified: Secondary | ICD-10-CM | POA: Diagnosis not present

## 2023-09-14 DIAGNOSIS — Z881 Allergy status to other antibiotic agents status: Secondary | ICD-10-CM

## 2023-09-14 DIAGNOSIS — D6851 Activated protein C resistance: Secondary | ICD-10-CM | POA: Diagnosis present

## 2023-09-14 DIAGNOSIS — Z5900 Homelessness unspecified: Secondary | ICD-10-CM

## 2023-09-14 DIAGNOSIS — A599 Trichomoniasis, unspecified: Secondary | ICD-10-CM | POA: Diagnosis present

## 2023-09-14 DIAGNOSIS — I82A11 Acute embolism and thrombosis of right axillary vein: Secondary | ICD-10-CM

## 2023-09-14 DIAGNOSIS — B950 Streptococcus, group A, as the cause of diseases classified elsewhere: Secondary | ICD-10-CM | POA: Diagnosis present

## 2023-09-14 DIAGNOSIS — Z823 Family history of stroke: Secondary | ICD-10-CM

## 2023-09-14 DIAGNOSIS — Z5320 Procedure and treatment not carried out because of patient's decision for unspecified reasons: Secondary | ICD-10-CM | POA: Diagnosis present

## 2023-09-14 DIAGNOSIS — I82611 Acute embolism and thrombosis of superficial veins of right upper extremity: Secondary | ICD-10-CM | POA: Diagnosis present

## 2023-09-14 DIAGNOSIS — D509 Iron deficiency anemia, unspecified: Secondary | ICD-10-CM

## 2023-09-14 DIAGNOSIS — E8809 Other disorders of plasma-protein metabolism, not elsewhere classified: Secondary | ICD-10-CM | POA: Diagnosis present

## 2023-09-14 DIAGNOSIS — R918 Other nonspecific abnormal finding of lung field: Secondary | ICD-10-CM

## 2023-09-14 DIAGNOSIS — L0291 Cutaneous abscess, unspecified: Principal | ICD-10-CM

## 2023-09-14 DIAGNOSIS — F1190 Opioid use, unspecified, uncomplicated: Secondary | ICD-10-CM

## 2023-09-14 DIAGNOSIS — I82B11 Acute embolism and thrombosis of right subclavian vein: Secondary | ICD-10-CM | POA: Diagnosis present

## 2023-09-14 DIAGNOSIS — F1914 Other psychoactive substance abuse with psychoactive substance-induced mood disorder: Secondary | ICD-10-CM | POA: Diagnosis present

## 2023-09-14 DIAGNOSIS — L02414 Cutaneous abscess of left upper limb: Secondary | ICD-10-CM | POA: Diagnosis present

## 2023-09-15 ENCOUNTER — Inpatient Hospital Stay (HOSPITAL_COMMUNITY): Payer: MEDICAID

## 2023-09-15 ENCOUNTER — Emergency Department (HOSPITAL_COMMUNITY): Payer: MEDICAID

## 2023-09-15 ENCOUNTER — Encounter (HOSPITAL_COMMUNITY)
Admission: EM | Disposition: A | Discharge status: Home / Self Care | Payer: Self-pay | Source: Home / Self Care | Attending: Family Medicine

## 2023-09-15 ENCOUNTER — Inpatient Hospital Stay (HOSPITAL_COMMUNITY): Payer: MEDICAID | Admitting: Certified Registered Nurse Anesthetist

## 2023-09-15 ENCOUNTER — Encounter (HOSPITAL_COMMUNITY): Payer: Self-pay | Admitting: Internal Medicine

## 2023-09-15 ENCOUNTER — Other Ambulatory Visit: Payer: Self-pay

## 2023-09-15 DIAGNOSIS — B955 Unspecified streptococcus as the cause of diseases classified elsewhere: Secondary | ICD-10-CM | POA: Insufficient documentation

## 2023-09-15 DIAGNOSIS — M009 Pyogenic arthritis, unspecified: Secondary | ICD-10-CM

## 2023-09-15 DIAGNOSIS — F17210 Nicotine dependence, cigarettes, uncomplicated: Secondary | ICD-10-CM

## 2023-09-15 DIAGNOSIS — E041 Nontoxic single thyroid nodule: Secondary | ICD-10-CM

## 2023-09-15 DIAGNOSIS — M7989 Other specified soft tissue disorders: Secondary | ICD-10-CM | POA: Diagnosis not present

## 2023-09-15 DIAGNOSIS — I82611 Acute embolism and thrombosis of superficial veins of right upper extremity: Secondary | ICD-10-CM | POA: Diagnosis present

## 2023-09-15 DIAGNOSIS — D509 Iron deficiency anemia, unspecified: Secondary | ICD-10-CM | POA: Diagnosis present

## 2023-09-15 DIAGNOSIS — R7401 Elevation of levels of liver transaminase levels: Secondary | ICD-10-CM

## 2023-09-15 DIAGNOSIS — L02419 Cutaneous abscess of limb, unspecified: Secondary | ICD-10-CM | POA: Diagnosis not present

## 2023-09-15 DIAGNOSIS — F1990 Other psychoactive substance use, unspecified, uncomplicated: Secondary | ICD-10-CM

## 2023-09-15 DIAGNOSIS — L0291 Cutaneous abscess, unspecified: Secondary | ICD-10-CM | POA: Diagnosis present

## 2023-09-15 DIAGNOSIS — B182 Chronic viral hepatitis C: Secondary | ICD-10-CM | POA: Diagnosis present

## 2023-09-15 DIAGNOSIS — B951 Streptococcus, group B, as the cause of diseases classified elsewhere: Secondary | ICD-10-CM | POA: Diagnosis not present

## 2023-09-15 DIAGNOSIS — R918 Other nonspecific abnormal finding of lung field: Secondary | ICD-10-CM

## 2023-09-15 DIAGNOSIS — R7881 Bacteremia: Secondary | ICD-10-CM | POA: Diagnosis present

## 2023-09-15 DIAGNOSIS — I82621 Acute embolism and thrombosis of deep veins of right upper extremity: Secondary | ICD-10-CM | POA: Diagnosis present

## 2023-09-15 DIAGNOSIS — I82A11 Acute embolism and thrombosis of right axillary vein: Secondary | ICD-10-CM

## 2023-09-15 DIAGNOSIS — L03113 Cellulitis of right upper limb: Secondary | ICD-10-CM | POA: Diagnosis present

## 2023-09-15 DIAGNOSIS — L02413 Cutaneous abscess of right upper limb: Secondary | ICD-10-CM | POA: Diagnosis present

## 2023-09-15 DIAGNOSIS — M00061 Staphylococcal arthritis, right knee: Secondary | ICD-10-CM | POA: Diagnosis not present

## 2023-09-15 DIAGNOSIS — F1190 Opioid use, unspecified, uncomplicated: Secondary | ICD-10-CM

## 2023-09-15 DIAGNOSIS — F1210 Cannabis abuse, uncomplicated: Secondary | ICD-10-CM | POA: Diagnosis present

## 2023-09-15 DIAGNOSIS — A419 Sepsis, unspecified organism: Secondary | ICD-10-CM

## 2023-09-15 DIAGNOSIS — F32A Depression, unspecified: Secondary | ICD-10-CM | POA: Diagnosis present

## 2023-09-15 DIAGNOSIS — I76 Septic arterial embolism: Secondary | ICD-10-CM | POA: Diagnosis present

## 2023-09-15 DIAGNOSIS — E8809 Other disorders of plasma-protein metabolism, not elsewhere classified: Secondary | ICD-10-CM | POA: Diagnosis present

## 2023-09-15 DIAGNOSIS — R509 Fever, unspecified: Secondary | ICD-10-CM | POA: Diagnosis not present

## 2023-09-15 DIAGNOSIS — F1410 Cocaine abuse, uncomplicated: Secondary | ICD-10-CM | POA: Diagnosis present

## 2023-09-15 DIAGNOSIS — Z5900 Homelessness unspecified: Secondary | ICD-10-CM | POA: Diagnosis not present

## 2023-09-15 DIAGNOSIS — R079 Chest pain, unspecified: Secondary | ICD-10-CM

## 2023-09-15 DIAGNOSIS — F1123 Opioid dependence with withdrawal: Secondary | ICD-10-CM | POA: Diagnosis present

## 2023-09-15 DIAGNOSIS — F1914 Other psychoactive substance abuse with psychoactive substance-induced mood disorder: Secondary | ICD-10-CM | POA: Diagnosis present

## 2023-09-15 DIAGNOSIS — D6851 Activated protein C resistance: Secondary | ICD-10-CM | POA: Diagnosis present

## 2023-09-15 DIAGNOSIS — M00861 Arthritis due to other bacteria, right knee: Secondary | ICD-10-CM | POA: Diagnosis not present

## 2023-09-15 DIAGNOSIS — I378 Other nonrheumatic pulmonary valve disorders: Secondary | ICD-10-CM | POA: Diagnosis present

## 2023-09-15 DIAGNOSIS — B950 Streptococcus, group A, as the cause of diseases classified elsewhere: Secondary | ICD-10-CM | POA: Diagnosis not present

## 2023-09-15 DIAGNOSIS — A689 Relapsing fever, unspecified: Secondary | ICD-10-CM | POA: Diagnosis not present

## 2023-09-15 DIAGNOSIS — F1510 Other stimulant abuse, uncomplicated: Secondary | ICD-10-CM | POA: Diagnosis present

## 2023-09-15 DIAGNOSIS — M00261 Other streptococcal arthritis, right knee: Secondary | ICD-10-CM | POA: Diagnosis present

## 2023-09-15 DIAGNOSIS — L02415 Cutaneous abscess of right lower limb: Secondary | ICD-10-CM | POA: Diagnosis not present

## 2023-09-15 DIAGNOSIS — I82B11 Acute embolism and thrombosis of right subclavian vein: Secondary | ICD-10-CM | POA: Diagnosis present

## 2023-09-15 DIAGNOSIS — L02414 Cutaneous abscess of left upper limb: Secondary | ICD-10-CM | POA: Diagnosis present

## 2023-09-15 HISTORY — PX: IRRIGATION AND DEBRIDEMENT KNEE: SHX5185

## 2023-09-15 LAB — CBC WITH DIFFERENTIAL/PLATELET
Abs Immature Granulocytes: 0.11 10*3/uL — ABNORMAL HIGH (ref 0.00–0.07)
Basophils Absolute: 0 10*3/uL (ref 0.0–0.1)
Basophils Relative: 0 %
Eosinophils Absolute: 0 10*3/uL (ref 0.0–0.5)
Eosinophils Relative: 0 %
HCT: 23.6 % — ABNORMAL LOW (ref 36.0–46.0)
Hemoglobin: 7.7 g/dL — ABNORMAL LOW (ref 12.0–15.0)
Immature Granulocytes: 1 %
Lymphocytes Relative: 15 %
Lymphs Abs: 1.8 10*3/uL (ref 0.7–4.0)
MCH: 24.4 pg — ABNORMAL LOW (ref 26.0–34.0)
MCHC: 32.6 g/dL (ref 30.0–36.0)
MCV: 74.9 fL — ABNORMAL LOW (ref 80.0–100.0)
Monocytes Absolute: 0.5 10*3/uL (ref 0.1–1.0)
Monocytes Relative: 5 %
Neutro Abs: 8.9 10*3/uL — ABNORMAL HIGH (ref 1.7–7.7)
Neutrophils Relative %: 79 %
Platelets: 211 10*3/uL (ref 150–400)
RBC: 3.15 MIL/uL — ABNORMAL LOW (ref 3.87–5.11)
RDW: 18.7 % — ABNORMAL HIGH (ref 11.5–15.5)
WBC: 11 10*3/uL — ABNORMAL HIGH (ref 4.0–10.5)
nRBC: 0 % (ref 0.0–0.2)

## 2023-09-15 LAB — RESP PANEL BY RT-PCR (RSV, FLU A&B, COVID)  RVPGX2
Influenza A by PCR: NEGATIVE
Influenza B by PCR: NEGATIVE
Resp Syncytial Virus by PCR: NEGATIVE
SARS Coronavirus 2 by RT PCR: NEGATIVE

## 2023-09-15 LAB — RAPID URINE DRUG SCREEN, HOSP PERFORMED
Amphetamines: POSITIVE — AB
Barbiturates: NOT DETECTED
Benzodiazepines: NOT DETECTED
Cocaine: POSITIVE — AB
Opiates: POSITIVE — AB
Tetrahydrocannabinol: POSITIVE — AB

## 2023-09-15 LAB — COMPREHENSIVE METABOLIC PANEL
ALT: 127 U/L — ABNORMAL HIGH (ref 0–44)
AST: 34 U/L (ref 15–41)
Albumin: 2.5 g/dL — ABNORMAL LOW (ref 3.5–5.0)
Alkaline Phosphatase: 135 U/L — ABNORMAL HIGH (ref 38–126)
Anion gap: 11 (ref 5–15)
BUN: 19 mg/dL (ref 6–20)
CO2: 22 mmol/L (ref 22–32)
Calcium: 7.7 mg/dL — ABNORMAL LOW (ref 8.9–10.3)
Chloride: 102 mmol/L (ref 98–111)
Creatinine, Ser: 0.9 mg/dL (ref 0.44–1.00)
GFR, Estimated: >60 mL/min (ref >60)
Glucose, Bld: 123 mg/dL — ABNORMAL HIGH (ref 70–99)
Potassium: 3.5 mmol/L (ref 3.5–5.1)
Sodium: 135 mmol/L (ref 135–145)
Total Bilirubin: 1.7 mg/dL — ABNORMAL HIGH (ref <1.2)
Total Protein: 7 g/dL (ref 6.5–8.1)

## 2023-09-15 LAB — BLOOD CULTURE ID PANEL (REFLEXED) - BCID2

## 2023-09-15 LAB — ECHOCARDIOGRAM COMPLETE
AR max vel: 1.77 cm2
AV Area VTI: 1.64 cm2
AV Area mean vel: 1.6 cm2
AV Mean grad: 3 mm[Hg]
AV Peak grad: 6 mm[Hg]
Ao pk vel: 1.22 m/s
Area-P 1/2: 4.31 cm2
Calc EF: 57.9 %
S' Lateral: 3 cm
Single Plane A2C EF: 62.9 %
Single Plane A4C EF: 58.5 %

## 2023-09-15 LAB — SYNOVIAL CELL COUNT + DIFF, W/ CRYSTALS
Crystals, Fluid: NONE SEEN
Eosinophils-Synovial: 5 % — ABNORMAL HIGH (ref 0–1)
Lymphocytes-Synovial Fld: 3 % (ref 0–20)
Monocyte-Macrophage-Synovial Fluid: 8 % — ABNORMAL LOW (ref 50–90)
Neutrophil, Synovial: 84 % — ABNORMAL HIGH (ref 0–25)
WBC, Synovial: 63200 /mm3 — ABNORMAL HIGH (ref 0–200)

## 2023-09-15 LAB — APTT: aPTT: 39 s — ABNORMAL HIGH (ref 24–36)

## 2023-09-15 LAB — URINALYSIS, W/ REFLEX TO CULTURE (INFECTION SUSPECTED)
Bacteria, UA: NONE SEEN
Bilirubin Urine: NEGATIVE
Glucose, UA: NEGATIVE mg/dL
Ketones, ur: NEGATIVE mg/dL
Nitrite: NEGATIVE
Protein, ur: 30 mg/dL — AB
Specific Gravity, Urine: 1.026 (ref 1.005–1.030)
pH: 5 (ref 5.0–8.0)

## 2023-09-15 LAB — TYPE AND SCREEN
ABO/RH(D): A NEG
Antibody Screen: NEGATIVE

## 2023-09-15 LAB — ANTITHROMBIN III: AntiThromb III Func: 45 % — ABNORMAL LOW (ref 75–120)

## 2023-09-15 LAB — TROPONIN I (HIGH SENSITIVITY)
Troponin I (High Sensitivity): 5 ng/L (ref <18)
Troponin I (High Sensitivity): 5 ng/L (ref <18)

## 2023-09-15 LAB — PROTIME-INR
INR: 1.4 — ABNORMAL HIGH (ref 0.8–1.2)
Prothrombin Time: 17.2 s — ABNORMAL HIGH (ref 11.4–15.2)

## 2023-09-15 LAB — HEPATITIS PANEL, ACUTE
HCV Ab: REACTIVE — AB
Hep A IgM: NONREACTIVE
Hep B C IgM: NONREACTIVE
Hepatitis B Surface Ag: NONREACTIVE

## 2023-09-15 LAB — TSH: TSH: 1.366 u[IU]/mL (ref 0.350–4.500)

## 2023-09-15 LAB — LACTIC ACID, PLASMA: Lactic Acid, Venous: 1.2 mmol/L (ref 0.5–1.9)

## 2023-09-15 LAB — PREPARE RBC (CROSSMATCH)

## 2023-09-15 LAB — HEMOGLOBIN AND HEMATOCRIT, BLOOD
HCT: 23.2 % — ABNORMAL LOW (ref 36.0–46.0)
HCT: 22.8 % — ABNORMAL LOW (ref 36.0–46.0)
Hemoglobin: 7.2 g/dL — ABNORMAL LOW (ref 12.0–15.0)
Hemoglobin: 7 g/dL — ABNORMAL LOW (ref 12.0–15.0)

## 2023-09-15 LAB — HIV ANTIBODY (ROUTINE TESTING W REFLEX): HIV Screen 4th Generation wRfx: NONREACTIVE

## 2023-09-15 LAB — PREGNANCY, URINE: Preg Test, Ur: NEGATIVE

## 2023-09-15 SURGERY — IRRIGATION AND DEBRIDEMENT KNEE
Anesthesia: General | Site: Knee | Laterality: Right

## 2023-09-15 MED ORDER — ALBUMIN HUMAN 25 % IV SOLN
50.0000 g | Freq: Once | INTRAVENOUS | Status: DC
  Filled 2023-09-15: qty 200

## 2023-09-15 MED ORDER — CHLORHEXIDINE GLUCONATE CLOTH 2 % EX PADS
6.0000 | MEDICATED_PAD | Freq: Every day | CUTANEOUS | Status: DC
  Administered 2023-09-15 – 2023-09-21 (×6): 6 via TOPICAL

## 2023-09-15 MED ORDER — DEXAMETHASONE SODIUM PHOSPHATE 4 MG/ML IJ SOLN
INTRAMUSCULAR | Status: DC | PRN
  Administered 2023-09-15: 5 mg via INTRAVENOUS

## 2023-09-15 MED ORDER — TRANEXAMIC ACID-NACL 1000-0.7 MG/100ML-% IV SOLN
1000.0000 mg | INTRAVENOUS | Status: DC
  Filled 2023-09-15: qty 100

## 2023-09-15 MED ORDER — LIDOCAINE HCL 2 % IJ SOLN
10.0000 mL | Freq: Once | INTRAMUSCULAR | Status: AC
  Administered 2023-09-15: 200 mg
  Filled 2023-09-15: qty 20

## 2023-09-15 MED ORDER — BUPRENORPHINE HCL-NALOXONE HCL 2-0.5 MG SL SUBL
2.0000 | SUBLINGUAL_TABLET | SUBLINGUAL | Status: AC | PRN
  Administered 2023-09-15 – 2023-09-16 (×4): 2 via SUBLINGUAL
  Filled 2023-09-15 (×4): qty 2

## 2023-09-15 MED ORDER — HYDROMORPHONE HCL 1 MG/ML IJ SOLN
INTRAMUSCULAR | Status: AC
  Administered 2023-09-15: 0.5 mg via INTRAVENOUS
  Filled 2023-09-15: qty 2

## 2023-09-15 MED ORDER — ONDANSETRON HCL 4 MG/2ML IJ SOLN: 4.0000 mg | Freq: Four times a day (QID) | INTRAMUSCULAR | Status: DC | PRN

## 2023-09-15 MED ORDER — CHLORHEXIDINE GLUCONATE 0.12 % MT SOLN
15.0000 mL | Freq: Once | OROMUCOSAL | Status: AC
  Administered 2023-09-15: 15 mL via OROMUCOSAL

## 2023-09-15 MED ORDER — VANCOMYCIN HCL 1000 MG IV SOLR
INTRAVENOUS | Status: DC | PRN
  Administered 2023-09-15: 1000 mg via TOPICAL

## 2023-09-15 MED ORDER — HEPARIN BOLUS VIA INFUSION
3000.0000 [IU] | Freq: Once | INTRAVENOUS | Status: AC
  Administered 2023-09-15: 3000 [IU] via INTRAVENOUS
  Filled 2023-09-15: qty 3000

## 2023-09-15 MED ORDER — HYDROMORPHONE HCL 2 MG/ML IJ SOLN
INTRAMUSCULAR | Status: AC
  Filled 2023-09-15: qty 1

## 2023-09-15 MED ORDER — IOHEXOL 350 MG/ML SOLN
100.0000 mL | Freq: Once | INTRAVENOUS | Status: AC | PRN
  Administered 2023-09-15: 100 mL via INTRAVENOUS

## 2023-09-15 MED ORDER — NICOTINE 21 MG/24HR TD PT24
21.0000 mg | MEDICATED_PATCH | Freq: Every day | TRANSDERMAL | Status: DC
  Administered 2023-09-15 – 2023-09-28 (×8): 21 mg via TRANSDERMAL
  Filled 2023-09-15 (×13): qty 1

## 2023-09-15 MED ORDER — OXYCODONE HCL 5 MG PO TABS
ORAL_TABLET | ORAL | Status: AC
  Administered 2023-09-15: 5 mg via ORAL
  Filled 2023-09-15: qty 1

## 2023-09-15 MED ORDER — LACTATED RINGERS IV BOLUS: 1000.0000 mL | Freq: Once | INTRAVENOUS | Status: DC

## 2023-09-15 MED ORDER — LEVOFLOXACIN IN D5W 500 MG/100ML IV SOLN
500.0000 mg | INTRAVENOUS | Status: AC
  Administered 2023-09-15: 500 mg via INTRAVENOUS
  Filled 2023-09-15: qty 100

## 2023-09-15 MED ORDER — VANCOMYCIN HCL IN DEXTROSE 1-5 GM/200ML-% IV SOLN
1000.0000 mg | Freq: Once | INTRAVENOUS | Status: AC
  Administered 2023-09-15: 1000 mg via INTRAVENOUS
  Filled 2023-09-15: qty 200

## 2023-09-15 MED ORDER — FENTANYL CITRATE PF 50 MCG/ML IJ SOSY
PREFILLED_SYRINGE | INTRAMUSCULAR | Status: AC
  Administered 2023-09-15: 50 ug via INTRAVENOUS
  Filled 2023-09-15: qty 2

## 2023-09-15 MED ORDER — PROPOFOL 10 MG/ML IV BOLUS
INTRAVENOUS | Status: DC | PRN
  Administered 2023-09-15: 200 mg via INTRAVENOUS

## 2023-09-15 MED ORDER — POVIDONE-IODINE 10 % EX SWAB
2.0000 | Freq: Once | CUTANEOUS | Status: DC
Start: 2023-09-15 — End: 2023-09-15

## 2023-09-15 MED ORDER — VANCOMYCIN HCL 1000 MG IV SOLR
INTRAVENOUS | Status: AC
  Filled 2023-09-15: qty 20

## 2023-09-15 MED ORDER — LIDOCAINE HCL (CARDIAC) PF 100 MG/5ML IV SOSY
PREFILLED_SYRINGE | INTRAVENOUS | Status: DC | PRN
  Administered 2023-09-15: 80 mg via INTRAVENOUS

## 2023-09-15 MED ORDER — HYDROMORPHONE HCL 1 MG/ML IJ SOLN
INTRAMUSCULAR | Status: DC | PRN
  Administered 2023-09-15: .6 mg via INTRAVENOUS
  Administered 2023-09-15: .5 mg via INTRAVENOUS
  Administered 2023-09-15: .4 mg via INTRAVENOUS
  Administered 2023-09-15: .5 mg via INTRAVENOUS

## 2023-09-15 MED ORDER — ORAL CARE MOUTH RINSE: 15.0000 mL | Freq: Once | OROMUCOSAL | Status: DC

## 2023-09-15 MED ORDER — SODIUM CHLORIDE 0.9 % IV SOLN: 250.0000 mL | INTRAVENOUS | Status: AC | PRN

## 2023-09-15 MED ORDER — LIDOCAINE HCL (PF) 2 % IJ SOLN
INTRAMUSCULAR | Status: AC
  Filled 2023-09-15: qty 5

## 2023-09-15 MED ORDER — BUPRENORPHINE HCL-NALOXONE HCL 8-2 MG SL SUBL
1.0000 | SUBLINGUAL_TABLET | Freq: Two times a day (BID) | SUBLINGUAL | Status: DC
  Administered 2023-09-15 – 2023-09-16 (×2): 1 via SUBLINGUAL
  Filled 2023-09-15 (×2): qty 1

## 2023-09-15 MED ORDER — SODIUM CHLORIDE 0.9 % IR SOLN
Status: DC | PRN
  Administered 2023-09-15: 3000 mL

## 2023-09-15 MED ORDER — ACETAMINOPHEN 325 MG PO TABS
650.0000 mg | ORAL_TABLET | Freq: Four times a day (QID) | ORAL | Status: DC | PRN
  Administered 2023-09-15 – 2023-09-24 (×10): 650 mg via ORAL
  Filled 2023-09-15 (×10): qty 2

## 2023-09-15 MED ORDER — ONDANSETRON HCL 4 MG PO TABS: 4.0000 mg | ORAL_TABLET | Freq: Four times a day (QID) | ORAL | Status: DC | PRN

## 2023-09-15 MED ORDER — FENTANYL CITRATE (PF) 100 MCG/2ML IJ SOLN
INTRAMUSCULAR | Status: AC
  Filled 2023-09-15: qty 2

## 2023-09-15 MED ORDER — DEXMEDETOMIDINE HCL IN NACL 80 MCG/20ML IV SOLN
INTRAVENOUS | Status: DC | PRN
  Administered 2023-09-15: 8 ug via INTRAVENOUS

## 2023-09-15 MED ORDER — SENNOSIDES-DOCUSATE SODIUM 8.6-50 MG PO TABS
1.0000 | ORAL_TABLET | Freq: Every evening | ORAL | Status: DC | PRN
  Administered 2023-09-23 – 2023-09-24 (×2): 1 via ORAL
  Filled 2023-09-15 (×3): qty 1

## 2023-09-15 MED ORDER — SODIUM CHLORIDE 0.9 % IV SOLN
2.0000 g | INTRAVENOUS | Status: DC
  Administered 2023-09-15: 2 g via INTRAVENOUS
  Filled 2023-09-15: qty 20

## 2023-09-15 MED ORDER — KETOROLAC TROMETHAMINE 15 MG/ML IJ SOLN
15.0000 mg | Freq: Four times a day (QID) | INTRAMUSCULAR | Status: AC | PRN
  Administered 2023-09-15 – 2023-09-19 (×11): 15 mg via INTRAVENOUS
  Filled 2023-09-15 (×12): qty 1

## 2023-09-15 MED ORDER — VANCOMYCIN HCL IN DEXTROSE 1-5 GM/200ML-% IV SOLN: 1000.0000 mg | INTRAVENOUS | Status: DC

## 2023-09-15 MED ORDER — LACTATED RINGERS IV BOLUS (SEPSIS)
1000.0000 mL | Freq: Once | INTRAVENOUS | Status: AC
  Administered 2023-09-15: 1000 mL via INTRAVENOUS

## 2023-09-15 MED ORDER — ONDANSETRON HCL 4 MG/2ML IJ SOLN
INTRAMUSCULAR | Status: AC
  Filled 2023-09-15: qty 2

## 2023-09-15 MED ORDER — LINEZOLID 600 MG/300ML IV SOLN
600.0000 mg | Freq: Two times a day (BID) | INTRAVENOUS | Status: DC
  Administered 2023-09-15 – 2023-09-17 (×3): 600 mg via INTRAVENOUS
  Filled 2023-09-15 (×6): qty 300

## 2023-09-15 MED ORDER — SODIUM CHLORIDE 0.9% FLUSH: 3.0000 mL | INTRAVENOUS | Status: DC | PRN

## 2023-09-15 MED ORDER — FENTANYL CITRATE PF 50 MCG/ML IJ SOSY
PREFILLED_SYRINGE | INTRAMUSCULAR | Status: AC
  Administered 2023-09-15: 50 ug via INTRAVENOUS
  Filled 2023-09-15: qty 1

## 2023-09-15 MED ORDER — ACETAMINOPHEN 650 MG RE SUPP: 650.0000 mg | Freq: Four times a day (QID) | RECTAL | Status: DC | PRN

## 2023-09-15 MED ORDER — LACTATED RINGERS IV SOLN: INTRAVENOUS | Status: AC

## 2023-09-15 MED ORDER — FENTANYL CITRATE PF 50 MCG/ML IJ SOSY
25.0000 ug | PREFILLED_SYRINGE | INTRAMUSCULAR | Status: DC | PRN
Start: 2023-09-15 — End: 2023-09-15
  Administered 2023-09-15: 50 ug via INTRAVENOUS

## 2023-09-15 MED ORDER — LACTATED RINGERS IV SOLN
INTRAVENOUS | Status: DC
Start: 2023-09-15 — End: 2023-09-15

## 2023-09-15 MED ORDER — OXYCODONE HCL 5 MG PO TABS: 5.0000 mg | ORAL_TABLET | Freq: Once | ORAL | Status: AC | PRN

## 2023-09-15 MED ORDER — CHLORHEXIDINE GLUCONATE 4 % EX SOLN: 60.0000 mL | Freq: Once | CUTANEOUS | Status: DC

## 2023-09-15 MED ORDER — VANCOMYCIN HCL 750 MG/150ML IV SOLN
750.0000 mg | Freq: Two times a day (BID) | INTRAVENOUS | Status: DC
  Filled 2023-09-15: qty 150

## 2023-09-15 MED ORDER — LACTATED RINGERS IV SOLN: INTRAVENOUS | Status: DC | PRN

## 2023-09-15 MED ORDER — BUPIVACAINE-EPINEPHRINE (PF) 0.5% -1:200000 IJ SOLN
INTRAMUSCULAR | Status: AC
  Filled 2023-09-15: qty 30

## 2023-09-15 MED ORDER — MIDAZOLAM HCL 2 MG/2ML IJ SOLN
INTRAMUSCULAR | Status: AC
  Filled 2023-09-15: qty 2

## 2023-09-15 MED ORDER — ORAL CARE MOUTH RINSE: 15.0000 mL | OROMUCOSAL | Status: DC | PRN

## 2023-09-15 MED ORDER — IOHEXOL 300 MG/ML  SOLN
75.0000 mL | Freq: Once | INTRAMUSCULAR | Status: AC | PRN
  Administered 2023-09-15: 50 mL via INTRAVENOUS

## 2023-09-15 MED ORDER — FENTANYL CITRATE (PF) 100 MCG/2ML IJ SOLN
INTRAMUSCULAR | Status: DC | PRN
  Administered 2023-09-15 (×2): 50 ug via INTRAVENOUS

## 2023-09-15 MED ORDER — ONDANSETRON HCL 4 MG/2ML IJ SOLN: 4.0000 mg | Freq: Once | INTRAMUSCULAR | Status: DC | PRN

## 2023-09-15 MED ORDER — METHYLPREDNISOLONE ACETATE 40 MG/ML IJ SUSP
INTRAMUSCULAR | Status: AC
  Filled 2023-09-15: qty 2

## 2023-09-15 MED ORDER — OXYCODONE HCL 5 MG/5ML PO SOLN: 5.0000 mg | Freq: Once | ORAL | Status: AC | PRN

## 2023-09-15 MED ORDER — HYDROMORPHONE HCL 1 MG/ML IJ SOLN
1.0000 mg | Freq: Once | INTRAMUSCULAR | Status: AC
  Administered 2023-09-15: 1 mg via INTRAVENOUS
  Filled 2023-09-15: qty 1

## 2023-09-15 MED ORDER — LACTATED RINGERS IV BOLUS (SEPSIS)
500.0000 mL | Freq: Once | INTRAVENOUS | Status: AC
  Administered 2023-09-15: 500 mL via INTRAVENOUS

## 2023-09-15 MED ORDER — MIDAZOLAM HCL 5 MG/5ML IJ SOLN
INTRAMUSCULAR | Status: DC | PRN
  Administered 2023-09-15: 2 mg via INTRAVENOUS

## 2023-09-15 MED ORDER — ORAL CARE MOUTH RINSE
15.0000 mL | OROMUCOSAL | Status: DC
  Administered 2023-09-15 – 2023-09-29 (×33): 15 mL via OROMUCOSAL

## 2023-09-15 MED ORDER — SODIUM CHLORIDE 0.9 % IV SOLN
1.0000 g | Freq: Once | INTRAVENOUS | Status: AC
  Administered 2023-09-15: 1 g via INTRAVENOUS
  Filled 2023-09-15: qty 20

## 2023-09-15 MED ORDER — HYDROMORPHONE HCL 1 MG/ML IJ SOLN
0.2500 mg | INTRAMUSCULAR | Status: DC | PRN
  Administered 2023-09-15 (×3): 0.5 mg via INTRAVENOUS

## 2023-09-15 MED ORDER — CEFAZOLIN SODIUM-DEXTROSE 2-4 GM/100ML-% IV SOLN
2.0000 g | Freq: Three times a day (TID) | INTRAVENOUS | Status: AC
  Administered 2023-09-16 – 2023-09-30 (×42): 2 g via INTRAVENOUS
  Filled 2023-09-15 (×42): qty 100

## 2023-09-15 MED ORDER — DEXAMETHASONE SODIUM PHOSPHATE 10 MG/ML IJ SOLN
INTRAMUSCULAR | Status: AC
  Filled 2023-09-15: qty 1

## 2023-09-15 MED ORDER — ONDANSETRON HCL 4 MG/2ML IJ SOLN
INTRAMUSCULAR | Status: DC | PRN
  Administered 2023-09-15: 4 mg via INTRAVENOUS

## 2023-09-15 MED ORDER — SODIUM CHLORIDE 0.9% FLUSH
3.0000 mL | Freq: Two times a day (BID) | INTRAVENOUS | Status: DC
Start: 2023-09-15 — End: 2023-09-24
  Administered 2023-09-15 – 2023-09-23 (×14): 3 mL via INTRAVENOUS

## 2023-09-15 MED ORDER — FERROUS SULFATE 325 (65 FE) MG PO TABS
325.0000 mg | ORAL_TABLET | Freq: Every day | ORAL | Status: DC
  Administered 2023-09-15 – 2023-09-30 (×16): 325 mg via ORAL
  Filled 2023-09-15 (×16): qty 1

## 2023-09-15 MED ORDER — PROPOFOL 10 MG/ML IV BOLUS
INTRAVENOUS | Status: AC
  Filled 2023-09-15: qty 20

## 2023-09-15 MED ORDER — VANCOMYCIN HCL IN DEXTROSE 1-5 GM/200ML-% IV SOLN
INTRAVENOUS | Status: AC
  Administered 2023-09-15: 1000 mg
  Filled 2023-09-15: qty 200

## 2023-09-15 MED ORDER — LACTATED RINGERS IV BOLUS (SEPSIS): 250.0000 mL | Freq: Once | INTRAVENOUS | Status: DC

## 2023-09-15 MED ORDER — CHLORHEXIDINE GLUCONATE 0.12 % MT SOLN: 15.0000 mL | Freq: Once | OROMUCOSAL | Status: DC

## 2023-09-15 MED ORDER — SODIUM CHLORIDE 0.9% FLUSH
3.0000 mL | Freq: Two times a day (BID) | INTRAVENOUS | Status: DC
  Administered 2023-09-15 – 2023-09-29 (×25): 3 mL via INTRAVENOUS

## 2023-09-15 MED ORDER — SODIUM CHLORIDE 0.9 % IV SOLN
1.0000 g | Freq: Three times a day (TID) | INTRAVENOUS | Status: DC
  Administered 2023-09-15: 1 g via INTRAVENOUS
  Filled 2023-09-15: qty 20

## 2023-09-15 MED ORDER — HEPARIN (PORCINE) 25000 UT/250ML-% IV SOLN
950.0000 [IU]/h | INTRAVENOUS | Status: DC
  Administered 2023-09-15: 950 [IU]/h via INTRAVENOUS
  Filled 2023-09-15: qty 250

## 2023-09-15 MED ORDER — SODIUM CHLORIDE 0.9% IV SOLUTION: Freq: Once | INTRAVENOUS | Status: DC

## 2023-09-15 SURGICAL SUPPLY — 66 items
BAG COUNTER SPONGE SURGICOUNT (BAG) ×1 IMPLANT
BLADE SAGITTAL 25.0X1.37X90 (BLADE) IMPLANT
BLADE SURG 15 STRL LF DISP TIS (BLADE) ×1 IMPLANT
BLADE SURG SZ10 CARB STEEL (BLADE) ×2 IMPLANT
BNDG ELASTIC 6INX 5YD STR LF (GAUZE/BANDAGES/DRESSINGS) IMPLANT
BNDG ELASTIC 6X10 VLCR STRL LF (GAUZE/BANDAGES/DRESSINGS) ×1 IMPLANT
CHLORAPREP W/TINT 26 (MISCELLANEOUS) ×2 IMPLANT
CLSR STERI-STRIP ANTIMIC 1/2X4 (GAUZE/BANDAGES/DRESSINGS) ×1 IMPLANT
CNTNR URN SCR LID CUP LEK RST (MISCELLANEOUS) ×3 IMPLANT
COVER SURGICAL LIGHT HANDLE (MISCELLANEOUS) ×1 IMPLANT
CUFF TRNQT CYL 24X4X16.5-23 (TOURNIQUET CUFF) ×1 IMPLANT
DISSECTOR 3.8MM X 13CM (MISCELLANEOUS) ×1 IMPLANT
DRAPE INCISE IOBAN 66X45 STRL (DRAPES) ×2 IMPLANT
DRAPE SHEET LG 3/4 BI-LAMINATE (DRAPES) ×4 IMPLANT
DRAPE TOP 10253 STERILE (DRAPES) ×1 IMPLANT
DRAPE U-SHAPE 47X51 STRL (DRAPES) ×2 IMPLANT
DRESSING MEPILEX FLEX 4X4 (GAUZE/BANDAGES/DRESSINGS) IMPLANT
DRSG EMULSION OIL 3X16 NADH (GAUZE/BANDAGES/DRESSINGS) ×1 IMPLANT
DRSG MEPILEX FLEX 4X4 (GAUZE/BANDAGES/DRESSINGS) ×1
DRSG MEPILEX POST OP 4X12 (GAUZE/BANDAGES/DRESSINGS) ×1 IMPLANT
DRSG TEGADERM 4X4.75 (GAUZE/BANDAGES/DRESSINGS) IMPLANT
ELECT BLADE TIP CTD 4 INCH (ELECTRODE) IMPLANT
EVACUATOR 1/8 PVC DRAIN (DRAIN) ×1 IMPLANT
EXCALIBUR 3.8MM X 13CM (MISCELLANEOUS) ×1 IMPLANT
GAUZE 4X4 16PLY ~~LOC~~+RFID DBL (SPONGE) ×1 IMPLANT
GAUZE PAD ABD 8X10 STRL (GAUZE/BANDAGES/DRESSINGS) IMPLANT
GAUZE SPONGE 2X2 8PLY STRL LF (GAUZE/BANDAGES/DRESSINGS) IMPLANT
GAUZE SPONGE 4X4 12PLY STRL (GAUZE/BANDAGES/DRESSINGS) IMPLANT
GAUZE XEROFORM 1X8 LF (GAUZE/BANDAGES/DRESSINGS) IMPLANT
GLOVE BIO SURGEON STRL SZ7.5 (GLOVE) ×3 IMPLANT
GLOVE BIOGEL PI IND STRL 7.5 (GLOVE) ×2 IMPLANT
GLOVE BIOGEL PI IND STRL 8 (GLOVE) ×2 IMPLANT
GLOVE PI ORTHO PRO STRL SZ8 (GLOVE) ×2 IMPLANT
GOWN STRL REUS W/ TWL LRG LVL3 (GOWN DISPOSABLE) ×3 IMPLANT
GOWN STRL SURGICAL XL XLNG (GOWN DISPOSABLE) ×1 IMPLANT
HOLDER FOLEY CATH W/STRAP (MISCELLANEOUS) ×1 IMPLANT
IMMOBILIZER KNEE 22 UNIV (SOFTGOODS) ×1 IMPLANT
KIT BASIN OR (CUSTOM PROCEDURE TRAY) ×1 IMPLANT
KIT TURNOVER KIT A (KITS) IMPLANT
MANIFOLD NEPTUNE II (INSTRUMENTS) ×1 IMPLANT
MARKER SKIN DUAL TIP RULER LAB (MISCELLANEOUS) ×1 IMPLANT
NS IRRIG 1000ML POUR BTL (IV SOLUTION) ×2 IMPLANT
PACK ARTHROSCOPY WL (CUSTOM PROCEDURE TRAY) ×1 IMPLANT
PACK TOTAL KNEE CUSTOM (KITS) ×1 IMPLANT
PADDING CAST COTTON 6X4 STRL (CAST SUPPLIES) IMPLANT
PROTECTOR NERVE ULNAR (MISCELLANEOUS) ×1 IMPLANT
SET HNDPC FAN SPRY TIP SCT (DISPOSABLE) ×1 IMPLANT
SPIKE FLUID TRANSFER (MISCELLANEOUS) ×1 IMPLANT
SUT ETHILON 2 0 PS N (SUTURE) IMPLANT
SUT ETHILON 3 0 FSL (SUTURE) ×2 IMPLANT
SUT NYLON 3 0 (SUTURE) ×1 IMPLANT
SUT PDS AB 0 CT 36 (SUTURE) ×1 IMPLANT
SUT PDS AB 2-0 CT2 27 (SUTURE) ×2 IMPLANT
SUT STRATAFIX 0 PDS 27 VIOLET (SUTURE) ×1
SUT STRATAFIX 14 PDO 48 VLT (SUTURE) ×1 IMPLANT
SUT STRATAFIX PDO 1 14 VIOLET (SUTURE) ×1
SUTURE STRATFX 0 PDS 27 VIOLET (SUTURE) ×1 IMPLANT
SYR 10ML LL (SYRINGE) ×1 IMPLANT
TOOTHBRUSH ADULT (PERSONAL CARE ITEMS) ×1 IMPLANT
TOWEL OR 17X26 10 PK STRL BLUE (TOWEL DISPOSABLE) ×1 IMPLANT
TRAY FOLEY MTR SLVR 14FR STAT (SET/KITS/TRAYS/PACK) ×1 IMPLANT
TRAY FOLEY MTR SLVR 16FR STAT (SET/KITS/TRAYS/PACK) ×1 IMPLANT
TUBE SUCTION HIGH CAP CLEAR NV (SUCTIONS) ×1 IMPLANT
TUBING ARTHROSCOPY IRRIG 16FT (MISCELLANEOUS) ×1 IMPLANT
WATER STERILE IRR 1000ML POUR (IV SOLUTION) ×1 IMPLANT
WRAP KNEE MAXI GEL POST OP (GAUZE/BANDAGES/DRESSINGS) ×1 IMPLANT

## 2023-09-15 NOTE — Consult Note (Signed)
[facility]  Total days of antibiotics 2  Reason for Consult: right native knee septic arthritis and SSTI to upper extremities     Referring Physician: kc  Principal Problem:   Sepsis (HCC) Active Problems:   Abscess of upper extremity   Septic arthritis of knee, right (HCC)   Multiple lung nodules-secondary to septic emboli   Acute deep vein thrombosis (DVT) of axillary vein of right upper extremity (HCC)   Microcytic anemia   Transaminitis   IVDU (intravenous drug user)   Thyroid nodule   Tobacco dependence due to cigarettes    HPI: Kaleisha E Kovalenko is a 27 y.o. female with hx of OUD, chronic hepatitis c, depression who presented to ED on 11/24, with a few weeks of feeling poorly and chills, and starting to have development of multiple abscess with draining pus to upper arms 2/2 injection drug use. She also reports having more swelling and pain to right knee and more swelling to right forearm than right. In ROS: she report noticing some chest pain htat I snow improved.  In the ED, wbc 11K, hgb 7.7, tbili of 1.7. she underwent aspirate of right knee showing 63K with 84$ N who had GPC in pairs. Sample from purulent arm lesions had mixed bacteria. Blood cx pending. Chest CT showed multiple scattered nodules concerning for septic emboli.  She reports hives to cephalexin recently but a a PCN allergy per her mother's report as a child.  Past Medical History:  Diagnosis Date   Anxiety    Asthma    "grew out"   Chlamydia    Depression    doing really good   Gestational diabetes    with first two pregnancies   Hepatitis C 03/2015   History of heroin abuse (HCC)    Homelessness    Infection    UTI   Polysubstance abuse (HCC) 05/28/2018   UDS positive for cocaine, MJ, meth, opioids   Type A blood, Rh negative     Allergies:  Allergies  Allergen Reactions   Keflex [Cephalexin] Hives   Penicillins Other (See Comments)    Results thru an allergy skin  test Has patient had a PCN reaction causing immediate rash, facial/tongue/throat swelling, SOB or lightheadedness with hypotension: No Has patient had a PCN reaction causing severe rash involving mucus membranes or skin necrosis: No Has patient had a PCN reaction that required hospitalization No Has patient had a PCN reaction occurring within the last 10 years: No If all of the above answers are "NO", then may proceed with Cephalosporin use.      MEDICATIONS:  [MAR Hold] sodium chloride   Intravenous Once   chlorhexidine  60 mL Topical Once   chlorhexidine  15 mL Mouth/Throat Once   Or   mouth rinse  15 mL Mouth Rinse Once   chlorhexidine  15 mL Mouth/Throat Once   [MAR Hold] Chlorhexidine Gluconate Cloth  6 each Topical Daily   [MAR Hold] ferrous sulfate  325 mg Oral Q breakfast   [MAR Hold] nicotine  21 mg Transdermal Daily   [MAR Hold] mouth rinse  15 mL Mouth Rinse 4 times per day   povidone-iodine  2 Application Topical Once   [MAR Hold] sodium chloride flush  3 mL Intravenous Q12H   [MAR Hold] sodium chloride flush  3 mL Intravenous Q12H    Social History   Tobacco Use   Smoking status: Some Days    Current packs/day: 0.25  Types: Cigarettes   Smokeless tobacco: Never   Tobacco comments:    4 cigs per day  Vaping Use   Vaping status: Never Used  Substance Use Topics   Alcohol use: Not Currently    Comment: occasionally   Drug use: Not Currently    Types: Marijuana, Heroin, Other-see comments    Comment: 05/28/18 +UDS for meth, cocaine, opioids, MJ;  heroin 3 years ago    Family History  Problem Relation Age of Onset   Diabetes Mother    Pulmonary embolism Mother    Stroke Mother    Cancer Paternal Grandmother        liver   Epilepsy Brother     Review of Systems -  12 point ros is negative except what is mentioned above  OBJECTIVE: Temp:  [98.4 F (36.9 C)-98.6 F (37 C)] 98.4 F (36.9 C) (11/25 1026) Pulse Rate:  [69-115] 73 (11/25 1300) Resp:   [11-20] 18 (11/25 1300) BP: (87-158)/(42-90) 158/72 (11/25 1300) SpO2:  [98 %-100 %] 100 % (11/25 1300) Physical Exam  Constitutional:  oriented to person, place, and time. appears well-developed and well-nourished. No distress.  HENT: [place]/AT, PERRLA, no scleral icterus Mouth/Throat: Oropharynx is clear and moist. No oropharyngeal exudate.  Cardiovascular: Normal rate, regular rhythm and normal heart sounds. Exam reveals no gallop and no friction rub.  No murmur heard.  Pulmonary/Chest: Effort normal and breath sounds normal. No respiratory distress.  has no wheezes.  Neck = supple, no nuchal rigidity Abdominal: Soft. Bowel sounds are normal.  exhibits no distension. There is no tenderness.  Lymphadenopathy: no cervical adenopathy. No axillary adenopathy Neurological: alert and oriented to person, place, and time.  Skin: multiple eschar to arms swollen warm right forearm, draining purulence on left forearm.  Ext: right knee swollen, tender with range of motion Psychiatric: a normal mood and affect.  behavior is normal.   LABS: Results for orders placed or performed during the hospital encounter of 09/14/23 (from the past 48 hour(s))  Resp panel by RT-PCR (RSV, Flu A&B, Covid) Urine, Clean Catch     Status: None   Collection Time: 09/15/23 12:45 AM   Specimen: Urine, Clean Catch; Nasal Swab  Result Value Ref Range   SARS Coronavirus 2 by RT PCR NEGATIVE NEGATIVE    Comment: (NOTE) SARS-CoV-2 target nucleic acids are NOT DETECTED.  The SARS-CoV-2 RNA is generally detectable in upper respiratory specimens during the acute phase of infection. The lowest concentration of SARS-CoV-2 viral copies this assay can detect is 138 copies/mL. A negative result does not preclude SARS-Cov-2 infection and should not be used as the sole basis for treatment or other patient management decisions. A negative result may occur with  improper specimen collection/handling, submission of specimen other than  nasopharyngeal swab, presence of viral mutation(s) within the areas targeted by this assay, and inadequate number of viral copies(<138 copies/mL). A negative result must be combined with clinical observations, patient history, and epidemiological information. The expected result is Negative.  Fact Sheet for Patients:  BloggerCourse.com  Fact Sheet for Healthcare Providers:  SeriousBroker.it  This test is no t yet approved or cleared by the [place] FDA and  has been authorized for detection and/or diagnosis of SARS-CoV-2 by FDA under an Emergency Use Authorization (EUA). This EUA will remain  in effect (meaning this test can be used) for the duration of the COVID-19 declaration under Section 564(b)(1) of the Act, 21 U.S.C.section 360bbb-3(b)(1), unless the authorization is terminated  or revoked sooner.  Influenza A by PCR NEGATIVE NEGATIVE   Influenza B by PCR NEGATIVE NEGATIVE    Comment: (NOTE) The Xpert Xpress SARS-CoV-2/FLU/RSV plus assay is intended as an aid in the diagnosis of influenza from Nasopharyngeal swab specimens and should not be used as a sole basis for treatment. Nasal washings and aspirates are unacceptable for Xpert Xpress SARS-CoV-2/FLU/RSV testing.  Fact Sheet for Patients: BloggerCourse.com  Fact Sheet for Healthcare Providers: SeriousBroker.it  This test is not yet approved or cleared by the [place] FDA and has been authorized for detection and/or diagnosis of SARS-CoV-2 by FDA under an Emergency Use Authorization (EUA). This EUA will remain in effect (meaning this test can be used) for the duration of the COVID-19 declaration under Section 564(b)(1) of the Act, 21 U.S.C. section 360bbb-3(b)(1), unless the authorization is terminated or revoked.     Resp Syncytial Virus by PCR NEGATIVE NEGATIVE    Comment: (NOTE) Fact Sheet for  Patients: BloggerCourse.com  Fact Sheet for Healthcare Providers: SeriousBroker.it  This test is not yet approved or cleared by the [place] FDA and has been authorized for detection and/or diagnosis of SARS-CoV-2 by FDA under an Emergency Use Authorization (EUA). This EUA will remain in effect (meaning this test can be used) for the duration of the COVID-19 declaration under Section 564(b)(1) of the Act, 21 U.S.C. section 360bbb-3(b)(1), unless the authorization is terminated or revoked.  Performed at [facility], [address], [place], [place] [address]   Comprehensive metabolic panel     Status: Abnormal   Collection Time: 09/15/23 12:45 AM  Result Value Ref Range   Sodium 135 135 - 145 mmol/L   Potassium 3.5 3.5 - 5.1 mmol/L   Chloride 102 98 - 111 mmol/L   CO2 22 22 - 32 mmol/L   Glucose, Bld 123 (H) 70 - 99 mg/dL    Comment: Glucose reference range applies only to samples taken after fasting for at least 8 hours.   BUN 19 6 - 20 mg/dL   Creatinine, Ser 0.27 0.44 - 1.00 mg/dL   Calcium 7.7 (L) 8.9 - 10.3 mg/dL   Total Protein 7.0 6.5 - 8.1 g/dL   Albumin 2.5 (L) 3.5 - 5.0 g/dL   AST 34 15 - 41 U/L   ALT 127 (H) 0 - 44 U/L   Alkaline Phosphatase 135 (H) 38 - 126 U/L   Total Bilirubin 1.7 (H) <1.2 mg/dL   GFR, Estimated >25 >36 mL/min    Comment: (NOTE) Calculated using the CKD-EPI Creatinine Equation (2021)    Anion gap 11 5 - 15    Comment: Performed at [facility], [address], [place], [place] [address]  CBC with Differential     Status: Abnormal   Collection Time: 09/15/23 12:45 AM  Result Value Ref Range   WBC 11.0 (H) 4.0 - 10.5 K/uL   RBC 3.15 (L) 3.87 - 5.11 MIL/uL   Hemoglobin 7.7 (L) 12.0 - 15.0 g/dL    Comment: Reticulocyte Hemoglobin testing may be clinically indicated, consider ordering this additional test [id number]    HCT 23.6 (L) 36.0 - 46.0 %    MCV 74.9 (L) 80.0 - 100.0 fL   MCH 24.4 (L) 26.0 - 34.0 pg   MCHC 32.6 30.0 - 36.0 g/dL   RDW 38.7 (H) 56.4 - 33.2 %   Platelets 211 150 - 400 K/uL   nRBC 0.0 0.0 - 0.2 %   Neutrophils Relative % 79 %   Neutro Abs  8.9 (H) 1.7 - 7.7 K/uL   Lymphocytes Relative 15 %   Lymphs Abs 1.8 0.7 - 4.0 K/uL   Monocytes Relative 5 %   Monocytes Absolute 0.5 0.1 - 1.0 K/uL   Eosinophils Relative 0 %   Eosinophils Absolute 0.0 0.0 - 0.5 K/uL   Basophils Relative 0 %   Basophils Absolute 0.0 0.0 - 0.1 K/uL   Immature Granulocytes 1 %   Abs Immature Granulocytes 0.11 (H) 0.00 - 0.07 K/uL    Comment: Performed at [facility], [address], [place], [place] [address]  Protime-INR     Status: Abnormal   Collection Time: 09/15/23 12:45 AM  Result Value Ref Range   Prothrombin Time 17.2 (H) 11.4 - 15.2 seconds   INR 1.4 (H) 0.8 - 1.2    Comment: (NOTE) INR goal varies based on device and disease states. Performed at [facility], [address], [place], [place] [address]   APTT     Status: Abnormal   Collection Time: 09/15/23 12:45 AM  Result Value Ref Range   aPTT 39 (H) 24 - 36 seconds    Comment:        IF BASELINE aPTT IS ELEVATED, SUGGEST PATIENT RISK ASSESSMENT BE USED TO DETERMINE APPROPRIATE ANTICOAGULANT THERAPY. Performed at [facility], [address], [place], [place] [address]   Blood Culture (routine x 2)     Status: None (Preliminary result)   Collection Time: 09/15/23 12:45 AM   Specimen: BLOOD LEFT FOREARM  Result Value Ref Range   Specimen Description BLOOD LEFT FOREARM    Special Requests      BOTTLES DRAWN AEROBIC AND ANAEROBIC Blood Culture adequate volume Performed at [facility], [address], [place], [place] [address]    Culture PENDING    Report Status PENDING   Urinalysis, w/ Reflex to Culture (Infection Suspected) -Urine, Clean Catch     Status: Abnormal   Collection Time: 09/15/23 12:45  AM  Result Value Ref Range   Specimen Source URINE, CLEAN CATCH    Color, Urine AMBER (A) YELLOW    Comment: BIOCHEMICALS MAY BE AFFECTED BY COLOR   APPearance CLEAR CLEAR   Specific Gravity, Urine 1.026 1.005 - 1.030   pH 5.0 5.0 - 8.0   Glucose, UA NEGATIVE NEGATIVE mg/dL   Hgb urine dipstick SMALL (A) NEGATIVE   Bilirubin Urine NEGATIVE NEGATIVE   Ketones, ur NEGATIVE NEGATIVE mg/dL   Protein, ur 30 (A) NEGATIVE mg/dL   Nitrite NEGATIVE NEGATIVE   Leukocytes,Ua TRACE (A) NEGATIVE   RBC / HPF 0-5 0 - 5 RBC/hpf   WBC, UA 6-10 0 - 5 WBC/hpf    Comment:        Reflex urine culture not performed if WBC <=10, OR if Squamous epithelial cells >5. If Squamous epithelial cells >5 suggest recollection.    Bacteria, UA NONE SEEN NONE SEEN   Squamous Epithelial / HPF 0-5 0 - 5 /HPF   Mucus PRESENT     Comment: Performed at [facility], [address] [place], [place] [address]  Troponin I (High Sensitivity)     Status: None   Collection Time: 09/15/23 12:45 AM  Result Value Ref Range   Troponin I (High Sensitivity) 5 <18 ng/L    Comment: (NOTE) Elevated high sensitivity troponin I (hsTnI) values and significant  changes across serial measurements may suggest ACS but many other  chronic and acute conditions are known to  elevate hsTnI results.  Refer to the "Links" section for chest pain algorithms and additional  guidance. Performed at [facility], [address] [place], [place] [address]   Pregnancy, urine     Status: None   Collection Time: 09/15/23 12:45 AM  Result Value Ref Range   Preg Test, Ur NEGATIVE NEGATIVE    Comment:        THE SENSITIVITY OF THIS METHODOLOGY IS >25 mIU/mL. Performed at [facility], [address] [place], [place] [address]   Blood Culture (routine x 2)     Status: None (Preliminary result)   Collection Time: 09/15/23  1:03 AM   Specimen: BLOOD LEFT ARM  Result Value Ref Range    Specimen Description BLOOD LEFT ARM    Special Requests      BOTTLES DRAWN AEROBIC AND ANAEROBIC Blood Culture adequate volume Performed at [facility], [address] [place], [place] [address]    Culture PENDING    Report Status PENDING   Aerobic/Anaerobic Culture w Gram Stain (surgical/deep wound)     Status: None (Preliminary result)   Collection Time: 09/15/23  1:43 AM   Specimen: BLOOD LEFT FOREARM; Abscess  Result Value Ref Range   Specimen Description      BLOOD LEFT FOREARM Performed at [facility], [address], [place], [place] [address]    Special Requests      NONE Performed at [facility], [address], [place], [place] [address]    Gram Stain      FEW WBC PRESENT, PREDOMINANTLY MONONUCLEAR FEW GRAM POSITIVE COCCI IN PAIRS AND CHAINS FEW GRAM VARIABLE ROD Performed at [facility], [address] [place], [place] [address]    Culture PENDING    Report Status PENDING   Type and screen [facility]     Status: None   Collection Time: 09/15/23  1:43 AM  Result Value Ref Range   ABO/RH(D) A NEG    Antibody Screen NEG    Sample Expiration      09/18/2023,2359 Performed at [facility], [address], [place], [place] [address]   Lactic acid, plasma     Status: None   Collection Time: 09/15/23  1:43 AM  Result Value Ref Range   Lactic Acid, Venous 1.2 0.5 - 1.9 mmol/L    Comment: Performed at [facility], [address], [place], [place] [address]  Body fluid culture w Gram Stain     Status: None (Preliminary result)   Collection Time: 09/15/23  2:37 AM   Specimen: KNEE; Body Fluid  Result Value Ref Range   Specimen Description      KNEE RIGHT Performed at [facility], [address] [place], [place] [address]    Special Requests      NONE Performed at [facility], [address], [place], [place]  [address]    Gram Stain      FEW WBC PRESENT,BOTH PMN AND MONONUCLEAR RARE GRAM POSITIVE COCCI IN PAIRS Performed at [facility], [address], [place], [place] [address]    Culture PENDING    Report Status PENDING   Cell count + diff,  w/ cryst-synvl fld     Status: Abnormal   Collection Time: 09/15/23  2:37 AM  Result Value Ref Range   Color, Synovial RED (A) YELLOW   Appearance-Synovial CLOUDY (A) CLEAR  Crystals, Fluid NO CRYSTALS SEEN    WBC, Synovial 63,200 (H) 0 - 200 /cu mm   Neutrophil, Synovial 84 (H) 0 - 25 %   Lymphocytes-Synovial Fld 3 0 - 20 %   Monocyte-Macrophage-Synovial Fluid 8 (L) 50 - 90 %   Eosinophils-Synovial 5 (H) 0 - 1 %   Other Cells-SYN      Intra and/or extracellular organisms present, correlate with Microbiology.    Comment: Performed at [facility], [address], [place], [place] [address]  Troponin I (High Sensitivity)     Status: None   Collection Time: 09/15/23  2:44 AM  Result Value Ref Range   Troponin I (High Sensitivity) 5 <18 ng/L    Comment: (NOTE) Elevated high sensitivity troponin I (hsTnI) values and significant  changes across serial measurements may suggest ACS but many other  chronic and acute conditions are known to elevate hsTnI results.  Refer to the "Links" section for chest pain algorithms and additional  guidance. Performed at [facility], [address] [place], [place] [address]   Rapid urine drug screen (hospital performed)     Status: Abnormal   Collection Time: 09/15/23  5:40 AM  Result Value Ref Range   Opiates POSITIVE (A) NONE DETECTED   Cocaine POSITIVE (A) NONE DETECTED   Benzodiazepines NONE DETECTED NONE DETECTED   Amphetamines POSITIVE (A) NONE DETECTED   Tetrahydrocannabinol POSITIVE (A) NONE DETECTED   Barbiturates NONE DETECTED NONE DETECTED    Comment: (NOTE) DRUG SCREEN FOR MEDICAL PURPOSES ONLY.  IF CONFIRMATION IS NEEDED FOR ANY PURPOSE, NOTIFY  LAB WITHIN 5 DAYS.  LOWEST DETECTABLE LIMITS FOR URINE DRUG SCREEN Drug Class                     Cutoff (ng/mL) Amphetamine and metabolites    1000 Barbiturate and metabolites    200 Benzodiazepine                 200 Opiates and metabolites        300 Cocaine and metabolites        300 THC                            50 Performed at [facility], [address], [place], [place] [address]   HIV Antibody (routine testing w rflx)     Status: None   Collection Time: 09/15/23  8:12 AM  Result Value Ref Range   HIV Screen 4th Generation wRfx Non Reactive Non Reactive    Comment: Performed at [facility], [address], [place], [place] [address]  Hepatitis panel, acute     Status: None (Preliminary result)   Collection Time: 09/15/23  8:12 AM  Result Value Ref Range   Hepatitis B Surface Ag NON REACTIVE NON REACTIVE   HCV Ab PENDING NON REACTIVE   Hep A IgM NON REACTIVE NON REACTIVE   Hep B C IgM NON REACTIVE NON REACTIVE    Comment: Performed at [facility], [address] [place], [place] [address]  Type and screen [facility]     Status: None (Preliminary result)   Collection Time: 09/15/23  8:12 AM  Result Value Ref Range   ABO/RH(D) A NEG    Antibody Screen NEG    Sample Expiration 09/18/2023,2359    Unit Number [id number]    Blood Component Type RED CELLS,LR  Unit division 00    Status of Unit ALLOCATED    Transfusion Status OK TO TRANSFUSE    Crossmatch Result      Compatible Performed at [facility], [address], [place], [place] [address]    Unit Number [id number]    Blood Component Type RBC LR PHER1    Unit division 00    Status of Unit ALLOCATED    Transfusion Status OK TO TRANSFUSE    Crossmatch Result Compatible   Prepare RBC (crossmatch)     Status: None   Collection Time: 09/15/23  8:12 AM  Result Value Ref Range   Order Confirmation      ORDER PROCESSED BY BLOOD  BANK Performed at [facility], [address], [place], [place] [address]   TSH     Status: None   Collection Time: 09/15/23  8:12 AM  Result Value Ref Range   TSH 1.366 0.350 - 4.500 uIU/mL    Comment: Performed by a 3rd Generation assay with a functional sensitivity of <=0.01 uIU/mL. Performed at [facility], [address], [place], [place] [address]   Hemoglobin and hematocrit, blood     Status: Abnormal   Collection Time: 09/15/23  1:19 PM  Result Value Ref Range   Hemoglobin 7.2 (L) 12.0 - 15.0 g/dL   HCT 84.6 (L) 96.2 - 95.2 %    Comment: Performed at [facility], [address], [place], [place] [address]    MICRO: 11/25 - few gpc in prs on synovial fluid IMAGING: ECHOCARDIOGRAM COMPLETE  Result Date: 09/15/2023    ECHOCARDIOGRAM REPORT   Patient Name:   NINA PEIKERT Macapagal Date of Exam: 09/15/2023 Medical Rec #:  [id number]     Height:       63.0 in Accession #:    [id number]    Weight:       120.0 lb Date of Birth:  07-16-1996     BSA:          1.556 m Patient Age:    27 years      BP:           134/71 mmHg Patient Gender: F             HR:           69 bpm. Exam Location:  Inpatient Procedure: 2D Echo, Cardiac Doppler and Color Doppler Indications:    IVDU  History:        Patient has no prior history of Echocardiogram examinations.  Sonographer:    Vern Claude Referring Phys: [id number] SUBRINA SUNDIL IMPRESSIONS  1. Left ventricular ejection fraction, by estimation, is 55 to 60%. The left ventricle has normal function. The left ventricle has no regional wall motion abnormalities. Left ventricular diastolic parameters were normal.  2. Right ventricular systolic function is normal. The right ventricular size is normal.  3. The mitral valve is normal in structure. Trivial mitral valve regurgitation. No evidence of mitral stenosis.  4. The aortic valve is normal in structure. Aortic valve regurgitation is not visualized. No aortic  stenosis is present.  5. The inferior vena cava is normal in size with greater than 50% respiratory variability, suggesting right atrial pressure of 3 mmHg. FINDINGS  Left Ventricle: Left ventricular ejection fraction, by estimation, is 55 to 60%. The left ventricle has normal function. The left ventricle has no regional wall motion abnormalities. The left ventricular internal cavity size was normal in size. There  is  no left ventricular hypertrophy. Left ventricular diastolic parameters were normal. Right Ventricle: The right ventricular size is normal. No increase in right ventricular wall thickness. Right ventricular systolic function is normal. Left Atrium: Left atrial size was normal in size. Right Atrium: Right atrial size was normal in size. Pericardium: There is no evidence of pericardial effusion. Mitral Valve: The mitral valve is normal in structure. Trivial mitral valve regurgitation. No evidence of mitral valve stenosis. Tricuspid Valve: The tricuspid valve is normal in structure. Tricuspid valve regurgitation is trivial. No evidence of tricuspid stenosis. Aortic Valve: The aortic valve is normal in structure. Aortic valve regurgitation is not visualized. No aortic stenosis is present. Aortic valve mean gradient measures 3.0 mmHg. Aortic valve peak gradient measures 6.0 mmHg. Aortic valve area, by VTI measures 1.64 cm. Pulmonic Valve: The pulmonic valve was normal in structure. Pulmonic valve regurgitation is trivial. No evidence of pulmonic stenosis. Aorta: The aortic root is normal in size and structure. Venous: The inferior vena cava is normal in size with greater than 50% respiratory variability, suggesting right atrial pressure of 3 mmHg. IAS/Shunts: No atrial level shunt detected by color flow Doppler.  LEFT VENTRICLE PLAX 2D LVIDd:         4.80 cm      Diastology LVIDs:         3.00 cm      LV e' medial:    11.40 cm/s LV PW:         0.70 cm      LV E/e' medial:  8.2 LV IVS:        0.50 cm      LV  e' lateral:   11.50 cm/s LVOT diam:     1.80 cm      LV E/e' lateral: 8.1 LV SV:         40 LV SV Index:   26 LVOT Area:     2.54 cm  LV Volumes (MOD) LV vol d, MOD A2C: 91.2 ml LV vol d, MOD A4C: 117.0 ml LV vol s, MOD A2C: 33.8 ml LV vol s, MOD A4C: 48.6 ml LV SV MOD A2C:     57.4 ml LV SV MOD A4C:     117.0 ml LV SV MOD BP:      60.5 ml RIGHT VENTRICLE             IVC RV Basal diam:  3.70 cm     IVC diam: 1.80 cm RV Mid diam:    3.20 cm RV S prime:     12.10 cm/s TAPSE (M-mode): 2.8 cm LEFT ATRIUM             Index        RIGHT ATRIUM           Index LA Vol (A2C):   33.4 ml 21.46 ml/m  RA Area:     10.90 cm LA Vol (A4C):   30.6 ml 19.66 ml/m  RA Volume:   26.40 ml  16.96 ml/m LA Biplane Vol: 33.6 ml 21.59 ml/m  AORTIC VALVE                    PULMONIC VALVE AV Area (Vmax):    1.77 cm     PV Vmax:          0.93 m/s AV Area (Vmean):   1.60 cm     PV Peak grad:     3.5 mmHg AV Area (VTI):  1.64 cm     PR End Diast Vel: 1.74 msec AV Vmax:           122.00 cm/s AV Vmean:          81.600 cm/s AV VTI:            0.247 m AV Peak Grad:      6.0 mmHg AV Mean Grad:      3.0 mmHg LVOT Vmax:         85.00 cm/s LVOT Vmean:        51.200 cm/s LVOT VTI:          0.159 m LVOT/AV VTI ratio: 0.64  AORTA Ao Root diam: 2.40 cm Ao Asc diam:  2.30 cm MITRAL VALVE MV Area (PHT): 4.31 cm    SHUNTS MV Decel Time: 176 msec    Systemic VTI:  0.16 m MV E velocity: 93.50 cm/s  Systemic Diam: 1.80 cm MV A velocity: 44.30 cm/s MV E/A ratio:  2.11 Arvilla Meres MD Electronically signed by Arvilla Meres MD Signature Date/Time: 09/15/2023/2:42:46 PM    Final    CT FOREARM LEFT W CONTRAST  Result Date: 09/15/2023 CLINICAL DATA:  Soft tissue infection suspected, forearm, no prior imaging. History of polysubstance abuse. EXAM: CT OF THE UPPER LEFT EXTREMITY WITH CONTRAST TECHNIQUE: Multidetector CT imaging of the left forearm was performed according to the standard protocol following intravenous contrast administration.  RADIATION DOSE REDUCTION: This exam was performed according to the departmental dose-optimization program which includes automated exposure control, adjustment of the mA and/or kV according to patient size and/or use of iterative reconstruction technique. CONTRAST:  50mL OMNIPAQUE IOHEXOL 300 MG/ML  SOLN COMPARISON:  None Available. FINDINGS: Bones/Joint/Cartilage No evidence of acute fracture, dislocation or osteomyelitis. No significant elbow or wrist joint effusions demonstrated. The joint spaces appear preserved. Ligaments Suboptimally assessed by CT. Muscles and Tendons No focal intramuscular fluid collection or abnormal enhancement identified. No significant tenosynovitis or tendon sheath air is seen. Soft tissues Soft tissue swelling and scattered air bubbles within the dorsal and radial aspect of the distal forearm. There is a small amount of surrounding ill-defined fluid, but no drainable collections are identified. No evidence of foreign body. No acute vascular findings are identified. IMPRESSION: 1. Soft tissue swelling and scattered air bubbles within the dorsal and radial aspect of the distal forearm consistent with cellulitis. No drainable collections identified. 2. No CT evidence of osteomyelitis or septic arthritis. Electronically Signed   By: Carey Bullocks M.D.   On: 09/15/2023 11:24   VAS [place] UPPER EXTREMITY VENOUS DUPLEX  Result Date: 09/15/2023 UPPER VENOUS STUDY  Patient Name:  DIANIRA RUDD Krehbiel  Date of Exam:   09/15/2023 Medical Rec #: [id number]      Accession #:    [id number] Date of Birth: 1995-11-24      Patient Gender: F Patient Age:   33 years Exam Location:  [facility] Procedure:      VAS [place] UPPER EXTREMITY VENOUS DUPLEX Referring Phys: Eulah Pont SUNDIL --------------------------------------------------------------------------------  Indications: Filling defects w/CT Risk Factors: None identified. Limitations: Open wound. Comparison Study: 09/14/2024 - CT Extrem Up Entire Arm R  W/CM                   IMPRESSION:                   1. Diffuse soft tissue thickening involving the right upper  extremity with areas of nodular thickening in the skin. No                   abscess                   is seen.                   2. Filling defects in the subclavian, axillary, and basilic                   veins                   suspicious for deep venous thrombosis. Ultrasound is                   recommended for                   further evaluation.                   3. No acute osseous abnormality. Performing Technologist: Chanda Busing RVT  Examination Guidelines: A complete evaluation includes B-mode imaging, spectral Doppler, color Doppler, and power Doppler as needed of all accessible portions of each vessel. Bilateral testing is considered an integral part of a complete examination. Limited examinations for reoccurring indications may be performed as noted.  Right Findings: +----------+------------+---------+-----------+----------+-------+ RIGHT     CompressiblePhasicitySpontaneousPropertiesSummary +----------+------------+---------+-----------+----------+-------+ IJV           Full       Yes       Yes                      +----------+------------+---------+-----------+----------+-------+ Subclavian    None       No        No                Acute  +----------+------------+---------+-----------+----------+-------+ Axillary      None       No        No                Acute  +----------+------------+---------+-----------+----------+-------+ Brachial    Partial      No        No                Acute  +----------+------------+---------+-----------+----------+-------+ Radial        Full                                          +----------+------------+---------+-----------+----------+-------+ Ulnar         Full                                          +----------+------------+---------+-----------+----------+-------+ Cephalic       None                                   Acute  +----------+------------+---------+-----------+----------+-------+ Basilic       None                                   Acute  +----------+------------+---------+-----------+----------+-------+  Left Findings: +----------+------------+---------+-----------+----------+-------+ LEFT      CompressiblePhasicitySpontaneousPropertiesSummary +----------+------------+---------+-----------+----------+-------+ Subclavian    Full       Yes       Yes                      +----------+------------+---------+-----------+----------+-------+  Summary:  Right: Findings consistent with acute deep vein thrombosis involving the right subclavian vein, right axillary vein and right brachial veins. Findings consistent with acute superficial vein thrombosis involving the right basilic vein and right cephalic vein.  Left: No evidence of thrombosis in the subclavian.  *See table(s) above for measurements and observations.     Preliminary    US Abdomen Complete  Result Date: 09/15/2023 CLINICAL DATA:  27 year old female with chest pain and shortness of breath. EXAM: ABDOMEN ULTRASOUND COMPLETE COMPARISON:  CTA chest 0342 hours today. FINDINGS: Gallbladder: Partially contracted. This appears to account for the appearance of the gallbladder wall on image 3. No sludge or stones identified. No sonographic Murphy sign elicited. Common bile duct: Diameter: Could not be visualized. Liver: No focal lesion identified. Within normal limits in parenchymal echogenicity. Portal vein is patent on color Doppler imaging with normal direction of blood flow towards the liver. IVC: No abnormality visualized. Pancreas: Visualized portion unremarkable. Spleen: Splenomegaly. Estimated splenic volume 695 mL (normal splenic volume range 83 - 412 mL). No discrete splenic lesion. Right Kidney: Length: 11.4 cm. Echogenicity within normal limits. No mass or hydronephrosis visualized. Left  Kidney: Length: 12.6 cm. Echogenicity within normal limits. No mass or hydronephrosis visualized. Abdominal aorta: No aneurysm visualized. Other findings: No free fluid identified. IMPRESSION: 1. Splenomegaly. 2. Partially contracted gallbladder. No evidence of cholelithiasis or acute cholecystitis. 3. Common bile duct could not be visualized but otherwise negative ultrasound appearance of the liver. Electronically Signed   By: Odessa Fleming M.D.   On: 09/15/2023 07:14   CT Extrem Up Entire Arm R W/CM  Result Date: 09/15/2023 CLINICAL DATA:  Soft tissue mass, forearm, concern for abscess. EXAM: CT OF THE UPPER RIGHT EXTREMITY WITH CONTRAST TECHNIQUE: Multidetector CT imaging of the upper right extremity was performed according to the standard protocol following intravenous contrast administration. RADIATION DOSE REDUCTION: This exam was performed according to the departmental dose-optimization program which includes automated exposure control, adjustment of the mA and/or kV according to patient size and/or use of iterative reconstruction technique. CONTRAST:  OMNIPAQUE IOHEXOL 350 MG/ML SOLN COMPARISON:  None Available. FINDINGS: Bones/Joint/Cartilage No acute fracture or dislocation is seen. No bony erosions or periosteal elevation is identified. No joint effusion is identified. Ligaments Suboptimally assessed by CT. Muscles and Tendons No intramuscular abscess or edema. Soft tissues Diffuse subcutaneous edema is noted in the right upper extremity. No subcutaneous emphysema is seen. Focal subcutaneous nodular densities are noted in the dermis at the antecubital space, mid forearm, and wrist. No abscess is seen. Filling defects are present in the basilic, axillary, and subclavian veins. There is suboptimal opacification of the radial and ulnar veins. No other acute abnormality is seen. IMPRESSION: 1. Diffuse soft tissue thickening involving the right upper extremity with areas of nodular thickening in the skin.  No abscess is seen. 2. Filling defects in the subclavian, axillary, and basilic veins suspicious for deep venous thrombosis. Ultrasound is recommended for further evaluation. 3. No acute osseous abnormality. Critical Value/emergent results were called by telephone at the time of interpretation on 09/15/2023 at 4:53 am to provider [facility] , who verbally acknowledged these results. Electronically Signed  By: Thornell Sartorius M.D.   On: 09/15/2023 04:55   CT Angio Chest PE W and/or Wo Contrast  Result Date: 09/15/2023 CLINICAL DATA:  Pulmonary embolism suspected, high probability. Chest pain and shortness of breath. Family history of factor 5 Leiden. EXAM: CT ANGIOGRAPHY CHEST WITH CONTRAST TECHNIQUE: Multidetector CT imaging of the chest was performed using the standard protocol during bolus administration of intravenous contrast. Multiplanar CT image reconstructions and MIPs were obtained to evaluate the vascular anatomy. RADIATION DOSE REDUCTION: This exam was performed according to the departmental dose-optimization program which includes automated exposure control, adjustment of the mA and/or kV according to patient size and/or use of iterative reconstruction technique. CONTRAST:  OMNIPAQUE IOHEXOL 350 MG/ML SOLN COMPARISON:  None Available. FINDINGS: Cardiovascular: The heart is normal in size and there is no pericardial effusion. The aorta and pulmonary trunk are normal in caliber. No evidence of pulmonary embolism is seen. Mediastinum/Nodes: No mediastinal or hilar lymphadenopathy is seen. Prominent lymph nodes are noted in the right axilla, likely reactive. There is a hypodense nodule in the left lobe of the thyroid gland measuring 1.7 cm. The trachea and esophagus are within normal limits. Lungs/Pleura: Central bronchial wall thickening is present bilaterally. Nodular opacities are noted in the right upper lobe measuring 1 cm, axial image 55 and 1 cm axial image 82. There is a 9 mm nodular  opacity in the right lower lobe, axial image 109. 5 mm nodule is present in the left lower lobe, axial image 82. No effusion or pneumothorax is seen. Upper Abdomen: Hypervascular focus is present in the anterior right lobe of the liver, possible flash filling hemangioma. No acute abnormality is seen. Musculoskeletal: No acute osseous abnormality. Review of the MIP images confirms the above findings. IMPRESSION: 1. No evidence of pulmonary embolism. 2. Scattered nodular opacities in the lungs bilaterally measuring up to 1 cm, which may be infectious, inflammatory, or neoplastic. Non-contrast chest CT at 3-6 months is recommended. If the nodules are stable at time of repeat CT, then future CT at 18-24 months (from today's scan) is considered optional for low-risk patients, but is recommended for high-risk patients. This recommendation follows the consensus statement: Guidelines for Management of Incidental Pulmonary Nodules Detected on CT Images: From the Fleischner Society 2017; Radiology 2017; 284:228-243. 3. 1.7 cm left thyroid nodule. Nonemergent thyroid ultrasound is recommended. Electronically Signed   By: Thornell Sartorius M.D.   On: 09/15/2023 04:25   DG Chest Port 1 View  Result Date: 09/15/2023 CLINICAL DATA:  Possible sepsis. EXAM: PORTABLE CHEST 1 VIEW COMPARISON:  07/11/2014. FINDINGS: The heart size and mediastinal contours are within normal limits. No consolidation, effusion, or pneumothorax. No acute osseous abnormality. IMPRESSION: No active disease. Electronically Signed   By: Thornell Sartorius M.D.   On: 09/15/2023 01:48   DG Knee Complete 4 Views Right  Result Date: 09/15/2023 CLINICAL DATA:  Questionable sepsis EXAM: RIGHT KNEE - COMPLETE 4+ VIEW COMPARISON:  None Available. FINDINGS: No evidence of fracture, dislocation, or joint effusion. No evidence of arthropathy or other focal bone abnormality. Soft tissues are unremarkable. IMPRESSION: Negative. Electronically Signed   By: Darliss Cheney  M.D.   On: 09/15/2023 01:48   DG Elbow Complete Right  Result Date: 09/15/2023 CLINICAL DATA:  Possible sepsis. EXAM: RIGHT ELBOW - COMPLETE 3+ VIEW COMPARISON:  None Available. FINDINGS: There is no evidence of acute fracture, dislocation, or joint effusion. There is no evidence of arthropathy or other focal bone abnormality. Diffuse soft tissue swelling  is seen. IMPRESSION: No acute osseous abnormality. Electronically Signed   By: Thornell Sartorius M.D.   On: 09/15/2023 01:48    HISTORICAL MICRO/IMAGING  Assessment/Plan:  27yo F with OUD recent injection use and multiple SSTI/abscess to forearms at injection sites, likely has had bacteremia to seed right knee for septic arthritis and septic emboli to lungs  - high probability for endocarditis c/b septic arthritis of right knee - recommend to get TTE/TEE to see if suspicion for endocarditis is correct - will follow up blood cx and OR cultures - recommend to narrow to vancomycin  Opiate use disorder = defer to primary for management or decision for suboxone  Chronic hepatitis c = will follow up on U/S.

## 2023-09-15 NOTE — Progress Notes (Signed)
ED Pharmacy Antibiotic Sign Off An antibiotic consult was received from an ED provider for Meropenem per pharmacy dosing for sepsis. A chart review was completed to assess appropriateness.   The following one time order(s) were placed:  Meropenem 1gm IV  Further antibiotic and/or antibiotic pharmacy consults should be ordered by the admitting provider if indicated.   Thank you for allowing pharmacy to be a part of this patient's care.   Maryellen Pile, [facility]  Clinical Pharmacist 09/15/23 12:33 AM

## 2023-09-15 NOTE — ED Provider Notes (Signed)
[place] EMERGENCY DEPARTMENT AT [facility] Provider Note   CSN: [id number] Arrival date & time: 09/14/23  2337     History  Chief Complaint  Patient presents with   Abscess    Abscess (Patient c/o multiple abscess that appear to be scabbed on bilateral arms and hands. Patient c/o of them itching at night. Patient stated she was seen at [facility] recently and they told her to go to ER but she did not go. Patient stated that she last used fentanyl IV last night around 1900-2000. )   Chest Pain    Mid chest pain that does not radiate     Laurie [name] is a 27 y.o. female.  The history is provided by the patient and medical records.  Laurie [name] is a 27 y.o. female who presents to the Emergency Department complaining of sick.  She presents to the emergency for feeling sick.  She states that she is on her arms 3 weeks ago and they started popping over the last few days.  5 days ago she started feeling sick and unwell with central chest pain and feeling tight and heavy with difficulty breathing.  She does not have a cough.  She reports 2 days of subjective fevers.  She reports bilateral knee pain over the last day but now she is unable to bear weight on her right knee secondary to severe pain.  She has a history of hepatitis C.  She is not currently on any routine medications.  She is status post tubal ligation.  She does smoke tobacco, no alcohol use.  She does use fentanyl.  She is typically she snorts but she did use IV a few weeks ago.  No history of bloodstream infections.  She has a family history of factor V Leiden in her mother and uncle.    Home Medications Prior to Admission medications   Medication Sig Start Date End Date Taking? Authorizing Provider  acetaminophen (TYLENOL) 500 MG tablet Take 500 mg by mouth every 6 (six) hours as needed for moderate pain (pain score 4-6).   Yes [provider]  ibuprofen (ADVIL) 200 MG tablet Take 200 mg by mouth every 6  (six) hours as needed for mild pain (pain score 1-3).   Yes [provider]  buPROPion (WELLBUTRIN SR) 150 MG 12 hr tablet Take 1 tablet (150 mg total) by mouth 2 (two) times daily. 04/26/20 04/26/21  Court Joy, PA-C  ferrous sulfate 325 (65 FE) MG tablet Take 1 tablet (325 mg total) by mouth daily. 03/10/20 07/08/20  Mirian Mo, MD  ibuprofen (ADVIL) 800 MG tablet Take 1 tablet (800 mg total) by mouth every 8 (eight) hours as needed. Patient not taking: Reported on 09/15/2023 03/10/20   Sparacino, Hailey L, DO  ondansetron (ZOFRAN ODT) 4 MG disintegrating tablet Take 1 tablet (4 mg total) by mouth every 8 (eight) hours as needed. Patient not taking: Reported on 09/15/2023 06/25/21   McDonald, Pedro Earls A, PA-C  Prenatal Vit-Fe Fumarate-FA (PRENATAL MULTIVITAMIN) TABS tablet Take 1 tablet by mouth daily at 12 noon. Patient not taking: Reported on 09/15/2023 06/08/18   Mirian Mo, MD  propranolol (INDERAL) 20 MG tablet Take 1 tablet (20 mg total) by mouth 3 (three) times daily. .Take 1-2 tabs  with onset of anxiety panic Patient not taking: Reported on 09/15/2023 04/26/20   Court Joy, PA-C      Allergies    Keflex [cephalexin] and Penicillins    Review  of Systems   Review of Systems  All other systems reviewed and are negative.   Physical Exam Updated Vital Signs BP (!) 113/49   Pulse 80   Temp 98.6 F (37 C)   Resp 20   LMP 07/29/2023 (Approximate) Comment: None in November 2024 yet.  SpO2 98%  Physical Exam Vitals and nursing note reviewed.  Constitutional:      Appearance: She is well-developed.  HENT:     Head: Normocephalic and atraumatic.  Cardiovascular:     Rate and Rhythm: Regular rhythm. Tachycardia present.     Heart sounds: No murmur heard. Pulmonary:     Effort: Pulmonary effort is normal. No respiratory distress.     Breath sounds: Normal breath sounds.  Abdominal:     Palpations: Abdomen is soft.     Tenderness: There is no abdominal tenderness.  There is no guarding or rebound.  Musculoskeletal:     Comments: Diffuse erythema and edema of the right upper extremity.  Right upper extremity is fixed in flexion at the elbow at 90 degrees.  Unable to extend or supinate, pronate.  There is 1 spontaneously draining abscess to the right wrist.  There are 2 more more proximal abscesses to the right AC fossa and right mid forearm that are full.  5 out of 5 grip strength in the hand with sensation light touch in the hand.  2+ right radial pulse.  There is soft tissue swelling, warmth and tenderness to the right knee with decreased range of motion in the right knee.  Skin:    General: Skin is warm and dry.  Neurological:     Mental Status: She is alert and oriented to person, place, and time.  Psychiatric:        Behavior: Behavior normal.        ED Results / Procedures / Treatments   Labs (all labs ordered are listed, but only abnormal results are displayed) Labs Reviewed  COMPREHENSIVE METABOLIC PANEL - Abnormal; Notable for the following components:      Result Value   Glucose, Bld 123 (*)    Calcium 7.7 (*)    Albumin 2.5 (*)    ALT 127 (*)    Alkaline Phosphatase 135 (*)    Total Bilirubin 1.7 (*)    All other components within normal limits  CBC WITH DIFFERENTIAL/PLATELET - Abnormal; Notable for the following components:   WBC 11.0 (*)    RBC 3.15 (*)    Hemoglobin 7.7 (*)    HCT 23.6 (*)    MCV 74.9 (*)    MCH 24.4 (*)    RDW 18.7 (*)    Neutro Abs 8.9 (*)    Abs Immature Granulocytes 0.11 (*)    All other components within normal limits  PROTIME-INR - Abnormal; Notable for the following components:   Prothrombin Time 17.2 (*)    INR 1.4 (*)    All other components within normal limits  APTT - Abnormal; Notable for the following components:   aPTT 39 (*)    All other components within normal limits  URINALYSIS, W/ REFLEX TO CULTURE (INFECTION SUSPECTED) - Abnormal; Notable for the following components:   Color,  Urine AMBER (*)    Hgb urine dipstick SMALL (*)    Protein, ur 30 (*)    Leukocytes,Ua TRACE (*)    All other components within normal limits  SYNOVIAL CELL COUNT + DIFF, W/ CRYSTALS - Abnormal; Notable for the following components:   Color,  Synovial RED (*)    Appearance-Synovial CLOUDY (*)    WBC, Synovial 63,200 (*)    Neutrophil, Synovial 84 (*)    Monocyte-Macrophage-Synovial Fluid 8 (*)    Eosinophils-Synovial 5 (*)    All other components within normal limits  RAPID URINE DRUG SCREEN, HOSP PERFORMED - Abnormal; Notable for the following components:   Opiates POSITIVE (*)    Cocaine POSITIVE (*)    Amphetamines POSITIVE (*)    Tetrahydrocannabinol POSITIVE (*)    All other components within normal limits  RESP PANEL BY RT-PCR (RSV, FLU A&B, COVID)  RVPGX2  CULTURE, BLOOD (ROUTINE X 2)  CULTURE, BLOOD (ROUTINE X 2)  AEROBIC/ANAEROBIC CULTURE W GRAM STAIN (SURGICAL/DEEP WOUND)  BODY FLUID CULTURE W GRAM STAIN  LACTIC ACID, PLASMA  PREGNANCY, URINE  GLUCOSE, BODY FLUID OTHER            HEPARIN LEVEL (UNFRACTIONATED)  HIV ANTIBODY (ROUTINE TESTING W REFLEX)  HEPATITIS PANEL, ACUTE  TSH  ANTITHROMBIN III  PROTEIN C ACTIVITY  PROTEIN C, TOTAL  PROTEIN S ACTIVITY  PROTEIN S, TOTAL  LUPUS ANTICOAGULANT PANEL  BETA-2-GLYCOPROTEIN I ABS, IGG/M/A  HOMOCYSTEINE  FACTOR 5 LEIDEN  PROTHROMBIN GENE MUTATION  CARDIOLIPIN ANTIBODIES, IGG, IGM, IGA  PATHOLOGIST SMEAR REVIEW  RPR  I-STAT CG4 LACTIC ACID, ED  I-STAT CG4 LACTIC ACID, ED  TYPE AND SCREEN  TYPE AND SCREEN  PREPARE RBC (CROSSMATCH)  GC/CHLAMYDIA PROBE AMP ([place]) NOT AT [facility]  TROPONIN I (HIGH SENSITIVITY)  TROPONIN I (HIGH SENSITIVITY)    EKG EKG Interpretation Date/Time:  Monday September 15 2023 02:38:59 EST Ventricular Rate:  86 PR Interval:  140 QRS Duration:  90 QT Interval:  372 QTC Calculation: 445 R Axis:   72  Text Interpretation: Normal sinus rhythm Normal ECG Confirmed by Tilden Fossa  [phone]) on 09/15/2023 2:58:47 AM  Radiology CT Extrem Up Entire Arm R W/CM  Result Date: 09/15/2023 CLINICAL DATA:  Soft tissue mass, forearm, concern for abscess. EXAM: CT OF THE UPPER RIGHT EXTREMITY WITH CONTRAST TECHNIQUE: Multidetector CT imaging of the upper right extremity was performed according to the standard protocol following intravenous contrast administration. RADIATION DOSE REDUCTION: This exam was performed according to the departmental dose-optimization program which includes automated exposure control, adjustment of the mA and/or kV according to patient size and/or use of iterative reconstruction technique. CONTRAST:  OMNIPAQUE IOHEXOL 350 MG/ML SOLN COMPARISON:  None Available. FINDINGS: Bones/Joint/Cartilage No acute fracture or dislocation is seen. No bony erosions or periosteal elevation is identified. No joint effusion is identified. Ligaments Suboptimally assessed by CT. Muscles and Tendons No intramuscular abscess or edema. Soft tissues Diffuse subcutaneous edema is noted in the right upper extremity. No subcutaneous emphysema is seen. Focal subcutaneous nodular densities are noted in the dermis at the antecubital space, mid forearm, and wrist. No abscess is seen. Filling defects are present in the basilic, axillary, and subclavian veins. There is suboptimal opacification of the radial and ulnar veins. No other acute abnormality is seen. IMPRESSION: 1. Diffuse soft tissue thickening involving the right upper extremity with areas of nodular thickening in the skin. No abscess is seen. 2. Filling defects in the subclavian, axillary, and basilic veins suspicious for deep venous thrombosis. Ultrasound is recommended for further evaluation. 3. No acute osseous abnormality. Critical Value/emergent results were called by telephone at the time of interpretation on 09/15/2023 at 4:53 am to provider [facility] , who verbally acknowledged these results. Electronically Signed   By: Thornell Sartorius  M.D.   On: 09/15/2023 04:55   CT Angio Chest PE W and/or Wo Contrast  Result Date: 09/15/2023 CLINICAL DATA:  Pulmonary embolism suspected, high probability. Chest pain and shortness of breath. Family history of factor 5 Leiden. EXAM: CT ANGIOGRAPHY CHEST WITH CONTRAST TECHNIQUE: Multidetector CT imaging of the chest was performed using the standard protocol during bolus administration of intravenous contrast. Multiplanar CT image reconstructions and MIPs were obtained to evaluate the vascular anatomy. RADIATION DOSE REDUCTION: This exam was performed according to the departmental dose-optimization program which includes automated exposure control, adjustment of the mA and/or kV according to patient size and/or use of iterative reconstruction technique. CONTRAST:  OMNIPAQUE IOHEXOL 350 MG/ML SOLN COMPARISON:  None Available. FINDINGS: Cardiovascular: The heart is normal in size and there is no pericardial effusion. The aorta and pulmonary trunk are normal in caliber. No evidence of pulmonary embolism is seen. Mediastinum/Nodes: No mediastinal or hilar lymphadenopathy is seen. Prominent lymph nodes are noted in the right axilla, likely reactive. There is a hypodense nodule in the left lobe of the thyroid gland measuring 1.7 cm. The trachea and esophagus are within normal limits. Lungs/Pleura: Central bronchial wall thickening is present bilaterally. Nodular opacities are noted in the right upper lobe measuring 1 cm, axial image 55 and 1 cm axial image 82. There is a 9 mm nodular opacity in the right lower lobe, axial image 109. 5 mm nodule is present in the left lower lobe, axial image 82. No effusion or pneumothorax is seen. Upper Abdomen: Hypervascular focus is present in the anterior right lobe of the liver, possible flash filling hemangioma. No acute abnormality is seen. Musculoskeletal: No acute osseous abnormality. Review of the MIP images confirms the above findings. IMPRESSION: 1. No  evidence of pulmonary embolism. 2. Scattered nodular opacities in the lungs bilaterally measuring up to 1 cm, which may be infectious, inflammatory, or neoplastic. Non-contrast chest CT at 3-6 months is recommended. If the nodules are stable at time of repeat CT, then future CT at 18-24 months (from today's scan) is considered optional for low-risk patients, but is recommended for high-risk patients. This recommendation follows the consensus statement: Guidelines for Management of Incidental Pulmonary Nodules Detected on CT Images: From the Fleischner Society 2017; Radiology 2017; 284:228-243. 3. 1.7 cm left thyroid nodule. Nonemergent thyroid ultrasound is recommended. Electronically Signed   By: Thornell Sartorius M.D.   On: 09/15/2023 04:25   DG Chest Port 1 View  Result Date: 09/15/2023 CLINICAL DATA:  Possible sepsis. EXAM: PORTABLE CHEST 1 VIEW COMPARISON:  07/11/2014. FINDINGS: The heart size and mediastinal contours are within normal limits. No consolidation, effusion, or pneumothorax. No acute osseous abnormality. IMPRESSION: No active disease. Electronically Signed   By: Thornell Sartorius M.D.   On: 09/15/2023 01:48   DG Knee Complete 4 Views Right  Result Date: 09/15/2023 CLINICAL DATA:  Questionable sepsis EXAM: RIGHT KNEE - COMPLETE 4+ VIEW COMPARISON:  None Available. FINDINGS: No evidence of fracture, dislocation, or joint effusion. No evidence of arthropathy or other focal bone abnormality. Soft tissues are unremarkable. IMPRESSION: Negative. Electronically Signed   By: Darliss Cheney M.D.   On: 09/15/2023 01:48   DG Elbow Complete Right  Result Date: 09/15/2023 CLINICAL DATA:  Possible sepsis. EXAM: RIGHT ELBOW - COMPLETE 3+ VIEW COMPARISON:  None Available. FINDINGS: There is no evidence of acute fracture, dislocation, or joint effusion. There is no evidence of arthropathy or other focal bone abnormality. Diffuse soft tissue swelling is seen. IMPRESSION: No acute  osseous abnormality.  Electronically Signed   By: Thornell Sartorius M.D.   On: 09/15/2023 01:48    Procedures .Joint Aspiration/Arthrocentesis  Date/Time: 09/15/2023 2:28 AM  Performed by: Tilden Fossa, MD Authorized by: Tilden Fossa, MD   Consent:    Consent obtained:  Verbal   Consent given by:  Patient   Risks discussed:  Bleeding, infection, pain and incomplete drainage Universal protocol:    Patient identity confirmed:  Verbally with patient Location:    Location:  Knee   Knee:  R knee Anesthesia:    Anesthesia method:  Local infiltration   Local anesthetic:  Lidocaine 2% w/o epi Procedure details:    Preparation: Patient was prepped and draped in usual sterile fashion     Needle gauge:  18 G   Approach:  Medial   Aspirate amount:  2.5   Aspirate characteristics:  Purulent   Steroid injected: no     Specimen collected: yes   Post-procedure details:    Dressing:  Adhesive bandage   Procedure completion:  Tolerated     Medications Ordered in ED Medications  lactated ringers bolus 1,000 mL (0 mLs Intravenous Stopped 09/15/23 0227)    And  lactated ringers bolus 500 mL (0 mLs Intravenous Stopped 09/15/23 0227)    And  lactated ringers bolus 250 mL (250 mLs Intravenous Not Given 09/15/23 0201)  heparin bolus via infusion 3,000 Units (3,000 Units Intravenous Bolus from Bag 09/15/23 0515)    Followed by  heparin ADULT infusion 100 units/mL (25000 units/228mL) (950 Units/hr Intravenous New Bag/Given 09/15/23 0517)  lactated ringers bolus 1,000 mL (has no administration in time range)  albumin human 25 % solution 50 g (has no administration in time range)  vancomycin (VANCOCIN) IVPB 1000 mg/200 mL premix (has no administration in time range)  vancomycin (VANCOREADY) IVPB 750 mg/150 mL (has no administration in time range)  ferrous sulfate tablet 325 mg (has no administration in time range)  sodium chloride flush (NS) 0.9 % injection 3 mL (has no administration in time range)  sodium  chloride flush (NS) 0.9 % injection 3 mL (has no administration in time range)  sodium chloride flush (NS) 0.9 % injection 3 mL (has no administration in time range)  0.9 %  sodium chloride infusion (has no administration in time range)  acetaminophen (TYLENOL) tablet 650 mg (has no administration in time range)    Or  acetaminophen (TYLENOL) suppository 650 mg (has no administration in time range)  senna-docusate (Senokot-S) tablet 1 tablet (has no administration in time range)  ondansetron (ZOFRAN) tablet 4 mg (has no administration in time range)    Or  ondansetron (ZOFRAN) injection 4 mg (has no administration in time range)  0.9 %  sodium chloride infusion (Manually program via Guardrails IV Fluids) (has no administration in time range)  meropenem (MERREM) 1 g in sodium chloride 0.9 % 100 mL IVPB (has no administration in time range)  lactated ringers infusion (has no administration in time range)  HYDROmorphone (DILAUDID) injection 1 mg (1 mg Intravenous Given 09/15/23 0126)  meropenem (MERREM) 1 g in sodium chloride 0.9 % 100 mL IVPB (0 g Intravenous Stopped 09/15/23 0149)  lidocaine (XYLOCAINE) 2 % (with pres) injection 200 mg (200 mg Other Given 09/15/23 0213)  iohexol (OMNIPAQUE) 350 MG/ML injection 100 mL (100 mLs Intravenous Contrast Given 09/15/23 0336)   CRITICAL CARE Performed by: Tilden Fossa   Total critical care time: 45 minutes  Critical care time was exclusive of separately billable procedures  and treating other patients.  Critical care was necessary to treat or prevent imminent or life-threatening deterioration.  Critical care was time spent personally by me on the following activities: development of treatment plan with patient and/or surrogate as well as nursing, discussions with consultants, evaluation of patient's response to treatment, examination of patient, obtaining history from patient or surrogate, ordering and performing treatments and interventions,  ordering and review of laboratory studies, ordering and review of radiographic studies, pulse oximetry and re-evaluation of patient's condition.  ED Course/ Medical Decision Making/ A&P                                 Medical Decision Making Amount and/or Complexity of Data Reviewed Labs: ordered. Radiology: ordered. ECG/medicine tests: ordered.  Risk Prescription drug management. Decision regarding hospitalization.   Patient without significant medical history, does use IV drugs here for evaluation of feeling unwell, chest pain and wounds to her arms as well as knee pain.  She is ill-appearing on examination, tachycardic.  Left upper extremity demonstrates a spontaneously ruptured abscess with significant purulent drainage.  There is a second spontaneously draining abscess to the right forearm.  There are 2 additional fluid collections that appear to be abscesses to the right arm with extensive cellulitis and swelling to the arm that limits range of motion.  Cannot rule out septic joint given her degree of swelling and pain.  She also has significant swelling and pain to the right knee without any overlying skin changes concerning for abscess.  Plain films are negative for acute abnormality.  Knee aspiration performed with a small amount of fluid returned, which was purulent.  Given her family history of factor V Leiden and symptoms a CTA PE study was obtained as well as a CT right upper extremity to evaluate for deep tissue space infection.  CTA demonstrates pulmonary nodules but no PE.  CT arm demonstrates DVT but no evidence of deep abscess.  She was started on heparin due to concern for her needing additional procedures.  She was also started on broad-spectrum antibiotics due to concern for sepsis and concern for endocarditis.  Discussed with patient findings of studies and recommendation for admission and she is in agreement treatment plan.  Hospitalist consulted for admission while knee  synovial fluid was still pending in the lab.        Final Clinical Impression(s) / ED Diagnoses Final diagnoses:  Abscess  Acute deep vein thrombosis (DVT) of other vein of right upper extremity (HCC)  Acute pain of right knee    Rx / DC Orders ED Discharge Orders     None         Tilden Fossa, MD 09/15/23 [phone]

## 2023-09-15 NOTE — Interval H&P Note (Signed)
The patient has been re-examined, and the chart reviewed, and there have been no interval changes to the documented history and physical.    Plan for R knee I&D for R knee septic arthritis. 60k nucleated cell and G+ cocci on gram stain. Discussed risk including post-traumatic arthritis, recurrence of infection. Pain and swelling post op.  The operative side was examined and the patient was confirmed to have sensation to DPN, SPN, TN intact, Motor EHL, ext, flex 5/5, and DP 2+, PT 2+, No significant edema.   The risks, benefits, and alternatives have been discussed at length with patient, and the patient is willing to proceed.  Right knee marked. Consent has been signed.

## 2023-09-15 NOTE — Anesthesia Postprocedure Evaluation (Signed)
Anesthesia Post Note  Patient: Laurie [name]  Procedure(s) Performed: IRRIGATION AND DEBRIDEMENT KNEE (Right: Knee)     Patient location during evaluation: PACU Anesthesia Type: General Level of consciousness: awake and alert Pain management: pain level controlled Vital Signs Assessment: post-procedure vital signs reviewed and stable Respiratory status: spontaneous breathing, nonlabored ventilation, respiratory function stable and patient connected to nasal cannula oxygen Cardiovascular status: blood pressure returned to baseline and stable Postop Assessment: no apparent nausea or vomiting Anesthetic complications: no  No notable events documented.  Last Vitals:  Vitals:   09/15/23 1745 09/15/23 1800  BP: (!) 116/59 117/63  Pulse: 70 65  Resp: 17 12  Temp: 36.7 C   SpO2: 96% 100%    Last Pain:  Vitals:   09/15/23 1745  TempSrc:   PainSc: 6                  Carlisa Eble S

## 2023-09-15 NOTE — H&P (Addendum)
History and Physical    Laurie [name] GNF:[id number] DOB: March 22, 1996 DOA: 09/14/2023  PCP: Pcp, No   Patient coming from: Home   Chief Complaint:  Chief Complaint  Patient presents with   Abscess    Abscess (Patient c/o multiple abscess that appear to be scabbed on bilateral arms and hands. Patient c/o of them itching at night. Patient stated she was seen at [facility], [facility], [facility] recently and they told her to go to ER but she did not go. Patient stated that she last used fentanyl IV last night around 1900-2000. )   Chest Pain    Mid chest pain that does not radiate    ED TRIAGE note:  HPI:  Laurie [name] is a 27 y.o. female with medical history significant of history of polysubstance use-include cocaine, amphetamine use disorder, polysubstance dependence, chronic smoking/tobacco use, hepatitis C-untreated, posttraumatic stress disorder, substance-induced mood disorder, chronic anxiety, chronic depression, and IV drug use presented to emergency department for evaluation for feeling sick. Patient stated that she has multiple abscess in her arm and that it started popping for last few days.  5 days ago she started to feeling sick, unwell with central chest pressure/pain and feeling tired and heavy with difficult breathing.  Patient denies any fever, chill and cough.  Reported 2 days of subjective fever.  Reported bilateral knee pain but for last 1 day she is unable to bear weight to her right knee secondary to severe pain.  Patient reported she is still smoking.  Denies any alcohol use.  She does use fentanyl.  She is typically snort fentanyl but she recently using IV fentanyl few weeks ago. Family history significant for factor V Leiden deficiency. Patient denies any cough, sputum production, abdominal pain, nausea, vomiting, diarrhea, change of appetite and weight.  No other complaint at this time.  ED Course:  At presentation to ED patient is hypotensive, 92/49, tachycardic 115, respiratory rate 18 and O2  sat 100% room air. Respiratory panel negative. CMP showing transaminitis with elevated AST 127, alkaline phosphatase 135 and elevated bilirubin 1.7.  Elevated pro time and INR. Blood culture is pending. UA amber appearance, hemoglobin small, protein 30 and trace leukocyte esterase, no bacteria.  Negative pregnancy test. Troponin 5 within normal range. CBC showing leukocytosis 11, low H&H 7.7 and 23 (baseline hemoglobin around 7-8), low MCV 74, and platelet count 211.  Chest x-ray no acute disease process. X-ray right knee no evidence of fracture dislocation or joint effusion. X-ray right elbow no osseous abnormality.   CT extremity of the right arm: 1. Diffuse soft tissue thickening involving the right upper extremity with areas of nodular thickening in the skin. No abscess is seen. 2. Filling defects in the subclavian, axillary, and basilic veins suspicious for deep venous thrombosis. Ultrasound is recommended for further evaluation. 3. No acute osseous abnormality.  CTA chest: 1. No evidence of pulmonary embolism. 2. Scattered nodular opacities in the lungs bilaterally measuring up to 1 cm, which may be infectious, inflammatory, or neoplastic. Non-contrast chest CT at 3-6 months is recommended. If the nodules are stable at time of repeat CT, then future CT at 18-24 months (from today's scan) is considered optional for low-risk patients, but is recommended for high-risk patients. This recommendation follows the consensus statement: Guidelines for Management of Incidental Pulmonary Nodules Detected on CT Images: From the Fleischner Society 2017; Radiology 2017; 284:228-243. 3. 1.7 cm left thyroid nodule. Nonemergent thyroid ultrasound is recommended.  In the ED right knee joint aspiration has  been done by Dr. Madilyn Hook and fluid has been sent to cell count, Gram stain and culture.  In the ED patient has been started on heparin drip for the management of right upper extremity DVT.  In  the ED patient has been treated with IV meropenem (history of allergy to Keflex and penicillin).  For the management of bilateral forearm abscess/cellulitis, multiple pulmonary nodules from septic source and right-sided knee joint septic arthritis patient being started on IV meropenem while in the ED.  Hospitalist has been contacted for further management care of upper extremity abscess and cellulitis, right upper extremity DVT, septic lung emboli, transaminitis and further infection workup.    Significant labs in the ED: Lab Orders         Resp panel by RT-PCR (RSV, Flu A&B, Covid) Anterior Nasal Swab         Blood Culture (routine x 2)         Aerobic/Anaerobic Culture w Gram Stain (surgical/deep wound)         Body fluid culture w Gram Stain         Comprehensive metabolic panel         CBC with Differential         Protime-INR         APTT         Urinalysis, w/ Reflex to Culture (Infection Suspected) -Urine, Clean Catch         Lactic acid, plasma         Pregnancy, urine         Cell count + diff,  w/ cryst-synvl fld         Glucose, Body Fluid Other         Heparin level (unfractionated)         CBC         Rapid urine drug screen (hospital performed)         HIV Antibody (routine testing w rflx)         Hepatitis panel, acute         Comprehensive metabolic panel         TSH         Antithrombin III         Protein C activity         Protein C, total         Protein S activity         Protein S, total         Lupus anticoagulant panel         Beta-2-glycoprotein i abs, IgG/M/A         Homocysteine, serum         Factor 5 leiden         Prothrombin gene mutation         Cardiolipin antibodies, IgG, IgM, IgA         Pathologist smear review         RPR         Hemoglobin and hematocrit, blood         HCV RNA quant rflx ultra or genotyp         I-Stat Lactic Acid, ED       Review of Systems:  Review of Systems  Constitutional:  Positive for chills, fever and  malaise/fatigue. Negative for weight loss.  Respiratory:  Negative for cough, sputum production and shortness of breath.   Cardiovascular:  Negative for chest pain and leg swelling.  Gastrointestinal:  Negative for abdominal pain, diarrhea, heartburn, nausea and vomiting.  Genitourinary:  Negative for dysuria, frequency, hematuria and urgency.  Musculoskeletal:  Positive for joint pain. Negative for back pain, myalgias and neck pain.  Skin:        Abscess formation  Neurological:  Negative for dizziness and headaches.  Endo/Heme/Allergies:  Does not bruise/bleed easily.  Psychiatric/Behavioral:  The patient is not nervous/anxious.     Past Medical History:  Diagnosis Date   Anxiety    Asthma    "grew out"   Chlamydia    Depression    doing really good   Gestational diabetes    with first two pregnancies   Hepatitis C 03/2015   History of heroin abuse (HCC)    Homelessness    Infection    UTI   Polysubstance abuse (HCC) 05/28/2018   UDS positive for cocaine, MJ, meth, opioids   Type A blood, Rh negative     Past Surgical History:  Procedure Laterality Date   CESAREAN SECTION N/A 01/11/2016   Procedure: CESAREAN SECTION;  Surgeon: Brock Bad, MD;  Location: [facility] ORS;  Service: Obstetrics;  Laterality: N/A;   CESAREAN SECTION N/A 12/21/2016   Procedure: REPEAT CESAREAN SECTION;  Surgeon: Adam Phenix, MD;  Location: [facility] BIRTHING SUITES;  Service: Obstetrics;  Laterality: N/A;   CESAREAN SECTION N/A 06/05/2018   Procedure: REPEAT CESAREAN SECTION;  Surgeon: [name], MD;  Location: [facility] BIRTHING SUITES;  Service: Obstetrics;  Laterality: N/A;   CESAREAN SECTION WITH BILATERAL TUBAL LIGATION Bilateral 03/08/2020   Procedure: CESAREAN SECTION WITH BILATERAL TUBAL LIGATION;  Surgeon: Kathrynn Running, MD;  Location: [facility] ORS;  Service: Obstetrics;  Laterality: Bilateral;     reports that she has been smoking cigarettes. She has never used smokeless tobacco. She reports  that she does not currently use alcohol. She reports that she does not currently use drugs after having used the following drugs: Marijuana, Heroin, and Other-see comments.  Allergies  Allergen Reactions   Keflex [Cephalexin] Hives   Penicillins Other (See Comments)    Results thru an allergy skin test Has patient had a PCN reaction causing immediate rash, facial/tongue/throat swelling, SOB or lightheadedness with hypotension: No Has patient had a PCN reaction causing severe rash involving mucus membranes or skin necrosis: No Has patient had a PCN reaction that required hospitalization No Has patient had a PCN reaction occurring within the last 10 years: No If all of the above answers are "NO", then may proceed with Cephalosporin use.      Family History  Problem Relation Age of Onset   Diabetes Mother    Pulmonary embolism Mother    Stroke Mother    Cancer Paternal Grandmother        liver   Epilepsy Brother     Prior to Admission medications   Medication Sig Start Date End Date Taking? Authorizing Provider  buPROPion (WELLBUTRIN SR) 150 MG 12 hr tablet Take 1 tablet (150 mg total) by mouth 2 (two) times daily. 04/26/20 04/26/21  Court Joy, PA-C  calcium carbonate (TUMS - DOSED IN MG ELEMENTAL CALCIUM) 500 MG chewable tablet Chew 1-2 tablets by mouth as needed for indigestion or heartburn.    [provider]  ferrous sulfate 325 (65 FE) MG tablet Take 1 tablet (325 mg total) by mouth daily. 03/10/20 07/08/20  Mirian Mo, MD  hydrOXYzine (VISTARIL) 50 MG capsule Take 50 mg by mouth 3 (three) times daily as needed.  [provider]  ibuprofen (ADVIL) 800 MG tablet Take 1 tablet (800 mg total) by mouth every 8 (eight) hours as needed. Patient taking differently: Take 400 mg by mouth every 8 (eight) hours as needed. 03/10/20   Sparacino, Hailey L, DO  ondansetron (ZOFRAN ODT) 4 MG disintegrating tablet Take 1 tablet (4 mg total) by mouth every 8 (eight) hours as  needed. 06/25/21   McDonald, Mia A, PA-C  Prenatal Vit-Fe Fumarate-FA (PRENATAL MULTIVITAMIN) TABS tablet Take 1 tablet by mouth daily at 12 noon. 06/08/18   Mirian Mo, MD  propranolol (INDERAL) 20 MG tablet Take 1 tablet (20 mg total) by mouth 3 (three) times daily. .Take 1-2 tabs  with onset of anxiety panic 04/26/20   Court Joy, PA-C     Physical Exam: Vitals:   09/15/23 0515 09/15/23 0545 09/15/23 0600 09/15/23 0645  BP: (!) 100/43 (!) 106/49 (!) 113/49 129/62  Pulse: 83 87 80 86  Resp:    16  Temp:    98.5 F (36.9 C)  TempSrc:      SpO2: 98% 98% 98% 99%    Physical Exam Constitutional:      Appearance: She is ill-appearing.  Cardiovascular:     Rate and Rhythm: Normal rate and regular rhythm.     Heart sounds: Normal heart sounds. No murmur heard. Pulmonary:     Effort: Pulmonary effort is normal.     Breath sounds: No decreased breath sounds, wheezing, rhonchi or rales.  Abdominal:     General: Bowel sounds are normal.     Palpations: There is no hepatomegaly or mass.     Tenderness: There is no abdominal tenderness.  Musculoskeletal:     Right lower leg: Tenderness present. No edema.     Left lower leg: Tenderness present. No edema.     Comments: Right-sided knee joint brace status post aspiration. Left-sided knee joint no fluctuation, erythema, abscess or pus drainage.  Bilateral knee joint pain with movement.  Right-sided upper extremity swelling.  Skin:    Capillary Refill: Capillary refill takes less than 2 seconds.     Coloration: Skin is pale.     Findings: Rash present. No erythema.     Comments: Bilateral upper extremities multiple abscess formation.  Neurological:     Mental Status: She is alert and oriented to person, place, and time.  Psychiatric:        Mood and Affect: Mood normal.      Media Information   Document Information  Photos  Left wrist  09/15/2023 00:16  Attached To:  Hospital Encounter on 09/14/23  Source  Information  Tilden Fossa, MD  [facility]  Document History      Media Information   Document Information  Photos  Right forearm  09/15/2023 00:17  Attached To:  Hospital Encounter on 09/14/23  Source Information  Tilden Fossa, MD  [facility]  Document History    Labs on Admission: I have personally reviewed following labs and imaging studies  CBC: Recent Labs  Lab 09/15/23 0045  WBC 11.0*  NEUTROABS 8.9*  HGB 7.7*  HCT 23.6*  MCV 74.9*  PLT 211   Basic Metabolic Panel: Recent Labs  Lab 09/15/23 0045  NA 135  K 3.5  CL 102  CO2 22  GLUCOSE 123*  BUN 19  CREATININE 0.90  CALCIUM 7.7*   GFR: Estimated Creatinine Clearance: 77 mL/min (by C-G formula based on SCr of 0.9 mg/dL). Liver Function Tests: Recent Labs  Lab  09/15/23 0045  AST 34  ALT 127*  ALKPHOS 135*  BILITOT 1.7*  PROT 7.0  ALBUMIN 2.5*   No results for input(s): "LIPASE", "AMYLASE" in the last 168 hours. No results for input(s): "AMMONIA" in the last 168 hours. Coagulation Profile: Recent Labs  Lab 09/15/23 0045  INR 1.4*   Cardiac Enzymes: Recent Labs  Lab 09/15/23 0045 09/15/23 0244  TROPONINIHS 5 5   BNP (last 3 results) No results for input(s): "BNP" in the last 8760 hours. HbA1C: No results for input(s): "HGBA1C" in the last 72 hours. CBG: No results for input(s): "GLUCAP" in the last 168 hours. Lipid Profile: No results for input(s): "CHOL", "HDL", "LDLCALC", "TRIG", "CHOLHDL", "LDLDIRECT" in the last 72 hours. Thyroid Function Tests: No results for input(s): "TSH", "T4TOTAL", "FREET4", "T3FREE", "THYROIDAB" in the last 72 hours. Anemia Panel: No results for input(s): "VITAMINB12", "FOLATE", "FERRITIN", "TIBC", "IRON", "RETICCTPCT" in the last 72 hours. Urine analysis:    Component Value Date/Time   COLORURINE AMBER (A) 09/15/2023 0045   APPEARANCEUR CLEAR 09/15/2023 0045   APPEARANCEUR Cloudy 11/06/2014 2301   LABSPEC 1.026 09/15/2023  0045   LABSPEC 1.005 11/06/2014 2301   PHURINE 5.0 09/15/2023 0045   GLUCOSEU NEGATIVE 09/15/2023 0045   GLUCOSEU Negative 11/06/2014 2301   HGBUR SMALL (A) 09/15/2023 0045   BILIRUBINUR NEGATIVE 09/15/2023 0045   BILIRUBINUR negative 01/09/2016 1448   BILIRUBINUR Negative 11/06/2014 2301   KETONESUR NEGATIVE 09/15/2023 0045   PROTEINUR 30 (A) 09/15/2023 0045   UROBILINOGEN 4.0 01/09/2016 1448   UROBILINOGEN 2.0 (H) 08/31/2015 1840   NITRITE NEGATIVE 09/15/2023 0045   LEUKOCYTESUR TRACE (A) 09/15/2023 0045   LEUKOCYTESUR 2+ 11/06/2014 2301    Radiological Exams on Admission: I have personally reviewed images CT Extrem Up Entire Arm R W/CM  Result Date: 09/15/2023 CLINICAL DATA:  Soft tissue mass, forearm, concern for abscess. EXAM: CT OF THE UPPER RIGHT EXTREMITY WITH CONTRAST TECHNIQUE: Multidetector CT imaging of the upper right extremity was performed according to the standard protocol following intravenous contrast administration. RADIATION DOSE REDUCTION: This exam was performed according to the departmental dose-optimization program which includes automated exposure control, adjustment of the mA and/or kV according to patient size and/or use of iterative reconstruction technique. CONTRAST:  OMNIPAQUE IOHEXOL 350 MG/ML SOLN COMPARISON:  None Available. FINDINGS: Bones/Joint/Cartilage No acute fracture or dislocation is seen. No bony erosions or periosteal elevation is identified. No joint effusion is identified. Ligaments Suboptimally assessed by CT. Muscles and Tendons No intramuscular abscess or edema. Soft tissues Diffuse subcutaneous edema is noted in the right upper extremity. No subcutaneous emphysema is seen. Focal subcutaneous nodular densities are noted in the dermis at the antecubital space, mid forearm, and wrist. No abscess is seen. Filling defects are present in the basilic, axillary, and subclavian veins. There is suboptimal opacification of the radial and ulnar veins.  No other acute abnormality is seen. IMPRESSION: 1. Diffuse soft tissue thickening involving the right upper extremity with areas of nodular thickening in the skin. No abscess is seen. 2. Filling defects in the subclavian, axillary, and basilic veins suspicious for deep venous thrombosis. Ultrasound is recommended for further evaluation. 3. No acute osseous abnormality. Critical Value/emergent results were called by telephone at the time of interpretation on 09/15/2023 at 4:53 am to provider [facility] , who verbally acknowledged these results. Electronically Signed   By: Thornell Sartorius M.D.   On: 09/15/2023 04:55   CT Angio Chest PE W and/or Wo Contrast  Result Date: 09/15/2023  CLINICAL DATA:  Pulmonary embolism suspected, high probability. Chest pain and shortness of breath. Family history of factor 5 Leiden. EXAM: CT ANGIOGRAPHY CHEST WITH CONTRAST TECHNIQUE: Multidetector CT imaging of the chest was performed using the standard protocol during bolus administration of intravenous contrast. Multiplanar CT image reconstructions and MIPs were obtained to evaluate the vascular anatomy. RADIATION DOSE REDUCTION: This exam was performed according to the departmental dose-optimization program which includes automated exposure control, adjustment of the mA and/or kV according to patient size and/or use of iterative reconstruction technique. CONTRAST:  OMNIPAQUE IOHEXOL 350 MG/ML SOLN COMPARISON:  None Available. FINDINGS: Cardiovascular: The heart is normal in size and there is no pericardial effusion. The aorta and pulmonary trunk are normal in caliber. No evidence of pulmonary embolism is seen. Mediastinum/Nodes: No mediastinal or hilar lymphadenopathy is seen. Prominent lymph nodes are noted in the right axilla, likely reactive. There is a hypodense nodule in the left lobe of the thyroid gland measuring 1.7 cm. The trachea and esophagus are within normal limits. Lungs/Pleura: Central bronchial wall  thickening is present bilaterally. Nodular opacities are noted in the right upper lobe measuring 1 cm, axial image 55 and 1 cm axial image 82. There is a 9 mm nodular opacity in the right lower lobe, axial image 109. 5 mm nodule is present in the left lower lobe, axial image 82. No effusion or pneumothorax is seen. Upper Abdomen: Hypervascular focus is present in the anterior right lobe of the liver, possible flash filling hemangioma. No acute abnormality is seen. Musculoskeletal: No acute osseous abnormality. Review of the MIP images confirms the above findings. IMPRESSION: 1. No evidence of pulmonary embolism. 2. Scattered nodular opacities in the lungs bilaterally measuring up to 1 cm, which may be infectious, inflammatory, or neoplastic. Non-contrast chest CT at 3-6 months is recommended. If the nodules are stable at time of repeat CT, then future CT at 18-24 months (from today's scan) is considered optional for low-risk patients, but is recommended for high-risk patients. This recommendation follows the consensus statement: Guidelines for Management of Incidental Pulmonary Nodules Detected on CT Images: From the Fleischner Society 2017; Radiology 2017; 284:228-243. 3. 1.7 cm left thyroid nodule. Nonemergent thyroid ultrasound is recommended. Electronically Signed   By: Thornell Sartorius M.D.   On: 09/15/2023 04:25   DG Chest Port 1 View  Result Date: 09/15/2023 CLINICAL DATA:  Possible sepsis. EXAM: PORTABLE CHEST 1 VIEW COMPARISON:  07/11/2014. FINDINGS: The heart size and mediastinal contours are within normal limits. No consolidation, effusion, or pneumothorax. No acute osseous abnormality. IMPRESSION: No active disease. Electronically Signed   By: Thornell Sartorius M.D.   On: 09/15/2023 01:48   DG Knee Complete 4 Views Right  Result Date: 09/15/2023 CLINICAL DATA:  Questionable sepsis EXAM: RIGHT KNEE - COMPLETE 4+ VIEW COMPARISON:  None Available. FINDINGS: No evidence of fracture, dislocation, or joint  effusion. No evidence of arthropathy or other focal bone abnormality. Soft tissues are unremarkable. IMPRESSION: Negative. Electronically Signed   By: Darliss Cheney M.D.   On: 09/15/2023 01:48   DG Elbow Complete Right  Result Date: 09/15/2023 CLINICAL DATA:  Possible sepsis. EXAM: RIGHT ELBOW - COMPLETE 3+ VIEW COMPARISON:  None Available. FINDINGS: There is no evidence of acute fracture, dislocation, or joint effusion. There is no evidence of arthropathy or other focal bone abnormality. Diffuse soft tissue swelling is seen. IMPRESSION: No acute osseous abnormality. Electronically Signed   By: Thornell Sartorius M.D.   On: 09/15/2023 01:48  EKG: My personal interpretation of EKG shows: Normal sinus rhythm heart rate 86.  There is no ST and T wave abnormality.     Assessment/Plan: Principal Problem:   Sepsis (HCC) Active Problems:   Abscess of upper extremity   Septic arthritis of knee, right (HCC)   Multiple lung nodules-secondary to septic emboli   Acute deep vein thrombosis (DVT) of axillary vein of right upper extremity (HCC)   Microcytic anemia   Transaminitis   IVDU (intravenous drug user)   Thyroid nodule   Tobacco dependence due to cigarettes    Assessment and Plan: Sepsis secondary to arm abscess and cellulitis Bilateral upper extremity-cellulitis and abscess (bilateral forearm) Concern for right-sided knee septic arthritis Septic lung emboli IV drug user (IV fentanyl user) > Patient presenting with subjective fever and chill with associated bilateral upper extremity abscess with subsequent drainage by itself and bilateral knee joint pain for last 5 days.  She has history of chronic IV fentanyl use. -Tachycardic, hypotensive and she has source of infection which meets sepsis criteria.  Lactic acid 1.2 WNL. -Physical exam showed bilateral upper extremities multiple abscess formation and pus drainage.  Right-sided upper extremity swelling as compared to left side. - X-ray  bilateral knee did not showed any evidence of joint effusion. - CT right upper extremity showed: Marland Kitchen Diffuse soft tissue thickening involving the right upper extremity with areas of nodular thickening in the skin. No abscess is seen.  No acute osseous abnormality. -CT chest showed cattered nodular opacities in the lungs bilaterally measuring up to 1 cm, which may be infectious, inflammatory, or neoplastic.   -Blood cultures are pending. -Checking urine chlamydia, gonorrhea and RPR PCR. -Hepatitis hepatitis and HIV panel. - Right-sided joint aspiration showed cloudy appearance, WBC count 63,000 and neutrophil 84%.  Lab also reported to Dr. Madilyn Hook that it showed diplococci.  Patient pending culture of the knee joint aspiration. -Concern for disseminated bacterial infection  in the setting of IV drug use - Patient has history of hives with care and penicillin associated allergic skin reaction. -Concern for disseminated gonococcal infection given patient is allergic to penicillin and Keflex deferring to start IV ceftriaxone at this moment and will await for infectious disease recommendation. -Consulted infectious disease for further recommendation and evaluation. -In the ED patient has been resuscitated with 2 L of LR bolus.  Continue LR 125 cc/h. -Continue monitor fever curve, blood culture, urine chlamydia/gonorrhea/RPR PCR and joint fluid culture result. -Obtaining echocardiogram to rule out vegetation. - Continue cardiac monitoring. - Admitting patient to stepdown unit.   Right knee joint effusion Right knee septic arthritis > Patient presenting with bilateral knee joint pain mostly on the right side and unable to bear weight. - Dr. Madilyn Hook performed right knee joint aspiration from medial approach.  Fluid has been sent for cell count, Gram stain culture - Joint fluid cloudy appearance, WBC count 63,084% neutrophils.  Lab reported diplococci seen on Gram stain.  Pending culture.  Added gonorrhea and  chlamydia PCR to knee joint aspiration fluid. -Currently treating with IV vancomycin and meropenem. -Patient has history of allergy and hives with Keflex and penicillin.  Need to discuss with infectious disease in the daytime for further antibiotic guidance. -Consulted orthopedic Dr. Eulah Pont request for right-sided knee joint washout.  Recommended to keep patient n.p.o. and will evaluate patient.   Septic lung emboli - CT chest ruled out pulmonary embolism however it showed Scattered nodular opacities in the lungs bilaterally measuring up to 1 cm, which may  be infectious, inflammatory, or neoplastic.  Recommended 3 to 6 months noncontrast CT follow-up. -Concern for bilateral lung septic emboli in the setting of IV drug use. - Obtaining echocardiogram to rule out vegetation. - Currently treating with IV vancomycin and meropenem.   Right upper extremity DVT-involving subclavian, axillary and basilic vein Family history of factor V Leiden deficiency. > Physical exam showing right upper extremity swelling as compared to left-sided extremity. - CT right upper extremity showed: Filling defects in the subclavian, axillary, and basilic veins suspicious for deep venous thrombosis. Ultrasound is recommended for further evaluation. -With concern for right upper extremity DVT patient has been started with IV heparin drip in the ED. - Plan to continue IV heparin drip. - Obtain right upper extremity DVT ultrasound study to assess for filling defect. -Checking hypercoagulable panel.   Chronic microcytic anemia > Patient denies any menorrhagia, active GI bleed, hemoptysis or hematemesis. -Hemoglobin 7.7, hematocrit 23 and MCV 74 which is baseline - Given patient is currently on IV heparin drip continue to monitor H&H very closely, if hemoglobin drop less than 7 need to transfuse.  Obtaining type and screen, preparing to monitor blood in case patient need transfusion. - Continue oral iron  supplement.  Transaminitis History of hepatitis C-untreated -Patient has history of hepatitis C and has not been treated in the past.  Elevated AST/ALT and bilirubin 127/135 and 1.7.  Elevated PT, INR and APTT. - Patient denies any abdominal pain - Transaminitis secondary to likely untreated hepatitis C. - Elevated bilirubin secondary to dehydration. -Patient possibly has developing cirrhosis in the setting of chronically untreated hepatitis C -Obtaining hepatic ultrasound to look for hepatic contour. -Checking acute hepatitis panel. -Checking HCV quantitative with reflex genotype.  Thyroid nodule -CTA chest showed incidental finding of thyroid nodule - Checking TSH level - Need to follow-up with outpatient ultrasound.  History of polysubstance use History of chronic cocaine use History of amphetamine abuse -Currently patient reported currently using IV fentanyl. -Checking UDS. - Counseled patient at the bedside regarding cessation of IV drug use.  Chronic smoking cigarette - Currently patient is smoking 1 pack cigarette in a day. - She is requesting for nicotine patch. - Counseled the patient side for smoking cessation. - Continue nicotine patch 21 mg daily   DVT prophylaxis:  SQ Heparin Code Status:  Full Code Diet: Currently n.p.o. Family Communication: No family member at bedside now Disposition Plan: Pending clinical improvement. Consults: Infectious disease, Orthopedics and transition care team. Admission status:   Inpatient, Step Down Unit  Severity of Illness: The appropriate patient status for this patient is INPATIENT. Inpatient status is judged to be reasonable and necessary in order to provide the required intensity of service to ensure the patient's safety. The patient's presenting symptoms, physical exam findings, and initial radiographic and laboratory data in the context of their chronic comorbidities is felt to place them at high risk for further clinical  deterioration. Furthermore, it is not anticipated that the patient will be medically stable for discharge from the hospital within 2 midnights of admission.   * I certify that at the point of admission it is my clinical judgment that the patient will require inpatient hospital care spanning beyond 2 midnights from the point of admission due to high intensity of service, high risk for further deterioration and high frequency of surveillance required.Marland Kitchen    Tereasa Coop, MD [facility]  How to contact the [facility] Attending or Consulting provider 7A - 7P or covering provider during after  hours 7P -7A, for this patient.  Check the care team in [facility] and look for a) attending/consulting [facility] provider listed and b) the [facility] team listed Log into [email or web address] and use [facility] universal password to access. If you do not have the password, please contact the hospital operator. Locate the [facility] provider you are looking for under [facility] and page to a number that you can be directly reached. If you still have difficulty reaching the provider, please page the [facility] (Director on Call) for the Hospitalists listed on amion for assistance.  09/15/2023, 6:53 AM

## 2023-09-15 NOTE — Progress Notes (Signed)
PHARMACY - PHYSICIAN COMMUNICATION CRITICAL VALUE ALERT - BLOOD CULTURE IDENTIFICATION (BCID)  Laurie [name] is an 27 y.o. female who presented to [facility] on 09/14/2023 with a chief complaint of multiple abscess with draining pus to upper arms, right knee pain, and injection drug use.    Assessment:  Blood culture 4/4 bottles with GPC chains.  BCID: Streptococcus pyogenes.    Name of physician (or Provider) Contacted:  Dr Jonathon Bellows  Current antibiotics: Ceftriaxone, Vancomycin  Changes to prescribed antibiotics recommended:  Recommendations accepted by provider Noted Cephalexin allergy (hives), and Penicillin allergy (resulted on skin test), but tolerating ceftriaxone Change to: cefazolin 2 GM IV q8h + linezolid 600 mg IV q12h    Results for orders placed or performed during the hospital encounter of 09/14/23  Blood Culture ID Panel (Reflexed) (Collected: 09/15/2023  1:03 AM)  Result Value Ref Range   Enterococcus faecalis NOT DETECTED NOT DETECTED   Enterococcus Faecium NOT DETECTED NOT DETECTED   Listeria monocytogenes NOT DETECTED NOT DETECTED   Staphylococcus species NOT DETECTED NOT DETECTED   Staphylococcus aureus (BCID) NOT DETECTED NOT DETECTED   Staphylococcus epidermidis NOT DETECTED NOT DETECTED   Staphylococcus lugdunensis NOT DETECTED NOT DETECTED   Streptococcus species DETECTED (A) NOT DETECTED   Streptococcus agalactiae NOT DETECTED NOT DETECTED   Streptococcus pneumoniae NOT DETECTED NOT DETECTED   Streptococcus pyogenes DETECTED (A) NOT DETECTED   A.calcoaceticus-baumannii NOT DETECTED NOT DETECTED   Bacteroides fragilis NOT DETECTED NOT DETECTED   Enterobacterales NOT DETECTED NOT DETECTED   Enterobacter cloacae complex NOT DETECTED NOT DETECTED   Escherichia coli NOT DETECTED NOT DETECTED   Klebsiella aerogenes NOT DETECTED NOT DETECTED   Klebsiella oxytoca NOT DETECTED NOT DETECTED   Klebsiella pneumoniae NOT DETECTED NOT DETECTED   Proteus species NOT  DETECTED NOT DETECTED   Salmonella species NOT DETECTED NOT DETECTED   Serratia marcescens NOT DETECTED NOT DETECTED   Haemophilus influenzae NOT DETECTED NOT DETECTED   Neisseria meningitidis NOT DETECTED NOT DETECTED   Pseudomonas aeruginosa NOT DETECTED NOT DETECTED   Stenotrophomonas maltophilia NOT DETECTED NOT DETECTED   Candida albicans NOT DETECTED NOT DETECTED   Candida auris NOT DETECTED NOT DETECTED   Candida glabrata NOT DETECTED NOT DETECTED   Candida krusei NOT DETECTED NOT DETECTED   Candida parapsilosis NOT DETECTED NOT DETECTED   Candida tropicalis NOT DETECTED NOT DETECTED   Cryptococcus neoformans/gattii NOT DETECTED NOT DETECTED    Lynann Beaver PharmD, BCPS [facility] main pharmacy [phone] 09/15/2023 6:13 PM

## 2023-09-15 NOTE — H&P (View-Only) (Signed)
Reason for Consult:Right knee septic joint Referring Physician: Lanae Boast Time called: 1610 Time at bedside: 0953   Laurie [name] is an 27 y.o. female.  HPI: Laurie [name] began to develop BUE ulcers about 3w ago. She was managing these at home but began to have severe right knee pain about 4d ago. She came to the ED last night and arthrocentesis was c/w a septic joint and orthopedic surgery was consulted. She also underwent CT angio of the RUE which did not show any abscesses but did show DVT. She is LHD.  Past Medical History:  Diagnosis Date   Anxiety    Asthma    "grew out"   Chlamydia    Depression    doing really good   Gestational diabetes    with first two pregnancies   Hepatitis C 03/2015   History of heroin abuse (HCC)    Homelessness    Infection    UTI   Polysubstance abuse (HCC) 05/28/2018   UDS positive for cocaine, MJ, meth, opioids   Type A blood, Rh negative     Past Surgical History:  Procedure Laterality Date   CESAREAN SECTION N/A 01/11/2016   Procedure: CESAREAN SECTION;  Surgeon: Brock Bad, MD;  Location: [facility];  Service: Obstetrics;  Laterality: N/A;   CESAREAN SECTION N/A 12/21/2016   Procedure: REPEAT CESAREAN SECTION;  Surgeon: Adam Phenix, MD;  Location: [facility] BIRTHING SUITES;  Service: Obstetrics;  Laterality: N/A;   CESAREAN SECTION N/A 06/05/2018   Procedure: REPEAT CESAREAN SECTION;  Surgeon: [name], MD;  Location: [facility] BIRTHING SUITES;  Service: Obstetrics;  Laterality: N/A;   CESAREAN SECTION WITH BILATERAL TUBAL LIGATION Bilateral 03/08/2020   Procedure: CESAREAN SECTION WITH BILATERAL TUBAL LIGATION;  Surgeon: Kathrynn Running, MD;  Location: [facility] OR[facility];  Service: Obstetrics;  Laterality: Bilateral;    Family History  Problem Relation Age of Onset   Diabetes Mother    Pulmonary embolism Mother    Stroke Mother    Cancer Paternal Grandmother        liver   Epilepsy Brother     Social History:  reports that she has been smoking  cigarettes. She has never used smokeless tobacco. She reports that she does not currently use alcohol. She reports that she does not currently use drugs after having used the following drugs: Marijuana, Heroin, and Other-see comments.  Allergies:  Allergies  Allergen Reactions   Keflex [Cephalexin] Hives   Penicillins Other (See Comments)    Results thru an allergy skin test Has patient had a PCN reaction causing immediate rash, facial/tongue/throat swelling, SOB or lightheadedness with hypotension: No Has patient had a PCN reaction causing severe rash involving mucus membranes or skin necrosis: No Has patient had a PCN reaction that required hospitalization No Has patient had a PCN reaction occurring within the last 10 years: No If all of the above answers are "NO", then may proceed with Cephalosporin use.      Medications: I have reviewed the patient's current medications.  Results for orders placed or performed during the hospital encounter of 09/14/23 (from the past 48 hour(s))  Resp panel by RT-PCR (RSV, Flu A&B, Covid) Urine, Clean Catch     Status: None   Collection Time: 09/15/23 12:45 AM   Specimen: Urine, Clean Catch; Nasal Swab  Result Value Ref Range   SARS Coronavirus 2 by RT PCR NEGATIVE NEGATIVE    Comment: (NOTE) SARS-CoV-2 target nucleic acids are NOT DETECTED.  The  SARS-CoV-2 RNA is generally detectable in upper respiratory specimens during the acute phase of infection. The lowest concentration of SARS-CoV-2 viral copies this assay can detect is 138 copies/mL. A negative result does not preclude SARS-Cov-2 infection and should not be used as the sole basis for treatment or other patient management decisions. A negative result may occur with  improper specimen collection/handling, submission of specimen other than nasopharyngeal swab, presence of viral mutation(s) within the areas targeted by this assay, and inadequate number of viral copies(<138 copies/mL). A  negative result must be combined with clinical observations, patient history, and epidemiological information. The expected result is Negative.  Fact Sheet for Patients:  BloggerCourse.com  Fact Sheet for Healthcare Providers:  SeriousBroker.it  This test is no t yet approved or cleared by the [place] FDA and  has been authorized for detection and/or diagnosis of SARS-CoV-2 by FDA under an Emergency Use Authorization (EUA). This EUA will remain  in effect (meaning this test can be used) for the duration of the COVID-19 declaration under Section 564(b)(1) of the Act, 21 U.S.C.section 360bbb-3(b)(1), unless the authorization is terminated  or revoked sooner.       Influenza A by PCR NEGATIVE NEGATIVE   Influenza B by PCR NEGATIVE NEGATIVE    Comment: (NOTE) The Xpert Xpress SARS-CoV-2/FLU/RSV plus assay is intended as an aid in the diagnosis of influenza from Nasopharyngeal swab specimens and should not be used as a sole basis for treatment. Nasal washings and aspirates are unacceptable for Xpert Xpress SARS-CoV-2/FLU/RSV testing.  Fact Sheet for Patients: BloggerCourse.com  Fact Sheet for Healthcare Providers: SeriousBroker.it  This test is not yet approved or cleared by the [place] FDA and has been authorized for detection and/or diagnosis of SARS-CoV-2 by FDA under an Emergency Use Authorization (EUA). This EUA will remain in effect (meaning this test can be used) for the duration of the COVID-19 declaration under Section 564(b)(1) of the Act, 21 U.S.C. section 360bbb-3(b)(1), unless the authorization is terminated or revoked.     Resp Syncytial Virus by PCR NEGATIVE NEGATIVE    Comment: (NOTE) Fact Sheet for Patients: BloggerCourse.com  Fact Sheet for Healthcare Providers: SeriousBroker.it  This test is not  yet approved or cleared by the [place] FDA and has been authorized for detection and/or diagnosis of SARS-CoV-2 by FDA under an Emergency Use Authorization (EUA). This EUA will remain in effect (meaning this test can be used) for the duration of the COVID-19 declaration under Section 564(b)(1) of the Act, 21 U.S.C. section 360bbb-3(b)(1), unless the authorization is terminated or revoked.  Performed at [facility], [address], [place], [place] [address]   Comprehensive metabolic panel     Status: Abnormal   Collection Time: 09/15/23 12:45 AM  Result Value Ref Range   Sodium 135 135 - 145 mmol/L   Potassium 3.5 3.5 - 5.1 mmol/L   Chloride 102 98 - 111 mmol/L   CO2 22 22 - 32 mmol/L   Glucose, Bld 123 (H) 70 - 99 mg/dL    Comment: Glucose reference range applies only to samples taken after fasting for at least 8 hours.   BUN 19 6 - 20 mg/dL   Creatinine, Ser 9.14 0.44 - 1.00 mg/dL   Calcium 7.7 (L) 8.9 - 10.3 mg/dL   Total Protein 7.0 6.5 - 8.1 g/dL   Albumin 2.5 (L) 3.5 - 5.0 g/dL   AST 34 15 - 41 U/L   ALT 127 (H) 0 - 44 U/L  Alkaline Phosphatase 135 (H) 38 - 126 U/L   Total Bilirubin 1.7 (H) <1.2 mg/dL   GFR, Estimated >44 >01 mL/min    Comment: (NOTE) Calculated using the CKD-EPI Creatinine Equation (2021)    Anion gap 11 5 - 15    Comment: Performed at [facility], [address], [place], [place] [address]  CBC with Differential     Status: Abnormal   Collection Time: 09/15/23 12:45 AM  Result Value Ref Range   WBC 11.0 (H) 4.0 - 10.5 K/uL   RBC 3.15 (L) 3.87 - 5.11 MIL/uL   Hemoglobin 7.7 (L) 12.0 - 15.0 g/dL    Comment: Reticulocyte Hemoglobin testing may be clinically indicated, consider ordering this additional test [id number]    HCT 23.6 (L) 36.0 - 46.0 %   MCV 74.9 (L) 80.0 - 100.0 fL   MCH 24.4 (L) 26.0 - 34.0 pg   MCHC 32.6 30.0 - 36.0 g/dL   RDW 74.2 (H) 59.5 - 63.8 %   Platelets 211 150 - 400 K/uL    nRBC 0.0 0.0 - 0.2 %   Neutrophils Relative % 79 %   Neutro Abs 8.9 (H) 1.7 - 7.7 K/uL   Lymphocytes Relative 15 %   Lymphs Abs 1.8 0.7 - 4.0 K/uL   Monocytes Relative 5 %   Monocytes Absolute 0.5 0.1 - 1.0 K/uL   Eosinophils Relative 0 %   Eosinophils Absolute 0.0 0.0 - 0.5 K/uL   Basophils Relative 0 %   Basophils Absolute 0.0 0.0 - 0.1 K/uL   Immature Granulocytes 1 %   Abs Immature Granulocytes 0.11 (H) 0.00 - 0.07 K/uL    Comment: Performed at [facility], [address] [place], [place] [address]  Protime-INR     Status: Abnormal   Collection Time: 09/15/23 12:45 AM  Result Value Ref Range   Prothrombin Time 17.2 (H) 11.4 - 15.2 seconds   INR 1.4 (H) 0.8 - 1.2    Comment: (NOTE) INR goal varies based on device and disease states. Performed at [facility], [address] [place], [place] [address]   APTT     Status: Abnormal   Collection Time: 09/15/23 12:45 AM  Result Value Ref Range   aPTT 39 (H) 24 - 36 seconds    Comment:        IF BASELINE aPTT IS ELEVATED, SUGGEST PATIENT RISK ASSESSMENT BE USED TO DETERMINE APPROPRIATE ANTICOAGULANT THERAPY. Performed at [facility], [address], [place], [place] [address]   Blood Culture (routine x 2)     Status: None (Preliminary result)   Collection Time: 09/15/23 12:45 AM   Specimen: BLOOD LEFT FOREARM  Result Value Ref Range   Specimen Description BLOOD LEFT FOREARM    Special Requests      BOTTLES DRAWN AEROBIC AND ANAEROBIC Blood Culture adequate volume Performed at [facility], [address], [place], [place] [address]    Culture PENDING    Report Status PENDING   Urinalysis, w/ Reflex to Culture (Infection Suspected) -Urine, Clean Catch     Status: Abnormal   Collection Time: 09/15/23 12:45 AM  Result Value Ref Range   Specimen Source URINE, CLEAN CATCH    Color, Urine AMBER (A) YELLOW    Comment: BIOCHEMICALS MAY BE AFFECTED BY COLOR    APPearance CLEAR CLEAR   Specific Gravity, Urine 1.026 1.005 - 1.030   pH 5.0 5.0 - 8.0   Glucose, UA NEGATIVE  NEGATIVE mg/dL   Hgb urine dipstick SMALL (A) NEGATIVE   Bilirubin Urine NEGATIVE NEGATIVE   Ketones, ur NEGATIVE NEGATIVE mg/dL   Protein, ur 30 (A) NEGATIVE mg/dL   Nitrite NEGATIVE NEGATIVE   Leukocytes,Ua TRACE (A) NEGATIVE   RBC / HPF 0-5 0 - 5 RBC/hpf   WBC, UA 6-10 0 - 5 WBC/hpf    Comment:        Reflex urine culture not performed if WBC <=10, OR if Squamous epithelial cells >5. If Squamous epithelial cells >5 suggest recollection.    Bacteria, UA NONE SEEN NONE SEEN   Squamous Epithelial / HPF 0-5 0 - 5 /HPF   Mucus PRESENT     Comment: Performed at [facility], [address], [place], [place] [address]  Troponin I (High Sensitivity)     Status: None   Collection Time: 09/15/23 12:45 AM  Result Value Ref Range   Troponin I (High Sensitivity) 5 <18 ng/L    Comment: (NOTE) Elevated high sensitivity troponin I (hsTnI) values and significant  changes across serial measurements may suggest ACS but many other  chronic and acute conditions are known to elevate hsTnI results.  Refer to the "Links" section for chest pain algorithms and additional  guidance. Performed at [facility], [address], [place], [place] [address]   Pregnancy, urine     Status: None   Collection Time: 09/15/23 12:45 AM  Result Value Ref Range   Preg Test, Ur NEGATIVE NEGATIVE    Comment:        THE SENSITIVITY OF THIS METHODOLOGY IS >25 mIU/mL. Performed at [facility], [address] [place], [place] [address]   Blood Culture (routine x 2)     Status: None (Preliminary result)   Collection Time: 09/15/23  1:03 AM   Specimen: BLOOD LEFT ARM  Result Value Ref Range   Specimen Description BLOOD LEFT ARM    Special Requests      BOTTLES DRAWN AEROBIC AND ANAEROBIC Blood Culture adequate volume Performed at [facility], [address] [place], [place] [address]    Culture PENDING    Report Status PENDING   Aerobic/Anaerobic Culture w Gram Stain (surgical/deep wound)     Status: None (Preliminary result)   Collection Time: 09/15/23  1:43 AM   Specimen: BLOOD LEFT FOREARM; Abscess  Result Value Ref Range   Specimen Description      BLOOD LEFT FOREARM Performed at [facility], [address] [place], [place] [address]    Special Requests      NONE Performed at [facility], [address], [place], [place] [address]    Gram Stain      FEW WBC PRESENT, PREDOMINANTLY MONONUCLEAR FEW GRAM POSITIVE COCCI IN PAIRS AND CHAINS FEW GRAM VARIABLE ROD Performed at [facility], [address], [place], [place] [address]    Culture PENDING    Report Status PENDING   Type and screen [facility]     Status: None   Collection Time: 09/15/23  1:43 AM  Result Value Ref Range   ABO/RH(D) A NEG    Antibody Screen NEG    Sample Expiration      09/18/2023,2359 Performed at [facility], [address] [place], [place] [address]   Lactic acid, plasma     Status: None   Collection Time: 09/15/23  1:43 AM  Result Value Ref Range  Lactic Acid, Venous 1.2 0.5 - 1.9 mmol/L    Comment: Performed at [facility], [address] [place], [place] [address]  Body fluid culture w Gram Stain     Status: None (Preliminary result)   Collection Time: 09/15/23  2:37 AM   Specimen: KNEE; Body Fluid  Result Value Ref Range   Specimen Description      KNEE RIGHT Performed at [facility], [address], [place], [place] [address]    Special Requests      NONE Performed at [facility], [address], [place], [place] [address]    Gram Stain      FEW WBC PRESENT,BOTH PMN AND MONONUCLEAR RARE GRAM POSITIVE COCCI IN PAIRS Performed at [facility], [address] [place], [place] [address]    Culture PENDING    Report Status PENDING   Cell count + diff,  w/ cryst-synvl fld     Status: Abnormal   Collection Time: 09/15/23  2:37 AM  Result Value Ref Range   Color, Synovial RED (A) YELLOW   Appearance-Synovial CLOUDY (A) CLEAR   Crystals, Fluid NO CRYSTALS SEEN    WBC, Synovial 63,200 (H) 0 - 200 /cu mm   Neutrophil, Synovial 84 (H) 0 - 25 %   Lymphocytes-Synovial Fld 3 0 - 20 %   Monocyte-Macrophage-Synovial Fluid 8 (L) 50 - 90 %   Eosinophils-Synovial 5 (H) 0 - 1 %   Other Cells-SYN      Intra and/or extracellular organisms present, correlate with Microbiology.    Comment: Performed at [facility], [address], [place], [place] [address]  Troponin I (High Sensitivity)     Status: None   Collection Time: 09/15/23  2:44 AM  Result Value Ref Range   Troponin I (High Sensitivity) 5 <18 ng/L    Comment: (NOTE) Elevated high sensitivity troponin I (hsTnI) values and significant  changes across serial measurements may suggest ACS but many other  chronic and acute conditions are known to elevate hsTnI results.  Refer to the "Links" section for chest pain algorithms and additional  guidance. Performed at [facility], [address], [place], [place] [address]   Rapid urine drug screen (hospital performed)     Status: Abnormal   Collection Time: 09/15/23  5:40 AM  Result Value Ref Range   Opiates POSITIVE (A) NONE DETECTED   Cocaine POSITIVE (A) NONE DETECTED   Benzodiazepines NONE DETECTED NONE DETECTED   Amphetamines POSITIVE (A) NONE DETECTED   Tetrahydrocannabinol POSITIVE (A) NONE DETECTED   Barbiturates NONE DETECTED NONE DETECTED    Comment: (NOTE) DRUG SCREEN FOR MEDICAL PURPOSES ONLY.  IF CONFIRMATION IS NEEDED FOR ANY PURPOSE, NOTIFY LAB WITHIN 5 DAYS.  LOWEST DETECTABLE LIMITS FOR URINE DRUG SCREEN Drug Class                     Cutoff (ng/mL) Amphetamine and metabolites     1000 Barbiturate and metabolites    200 Benzodiazepine                 200 Opiates and metabolites        300 Cocaine and metabolites        300 THC                            50 Performed at [facility], [address], [place], [place] [address]   Type and screen [facility]     Status: None (Preliminary result)   Collection Time: 09/15/23  8:12 AM  Result Value Ref Range   ABO/RH(D) A NEG    Antibody Screen NEG    Sample Expiration 09/18/2023,2359    Unit Number [id number]    Blood Component Type RED CELLS,LR    Unit division 00    Status of Unit ALLOCATED    Transfusion Status OK TO TRANSFUSE    Crossmatch Result      Compatible Performed at [facility], [address], [place], [place] [address]    Unit Number [id number]    Blood Component Type RBC LR PHER1    Unit division 00    Status of Unit ALLOCATED    Transfusion Status OK TO TRANSFUSE    Crossmatch Result Compatible   Prepare RBC (crossmatch)     Status: None   Collection Time: 09/15/23  8:12 AM  Result Value Ref Range   Order Confirmation      ORDER PROCESSED BY BLOOD BANK Performed at [facility], [address] [place], [place] [address]   TSH     Status: None   Collection Time: 09/15/23  8:12 AM  Result Value Ref Range   TSH 1.366 0.350 - 4.500 uIU/mL    Comment: Performed by a 3rd Generation assay with a functional sensitivity of <=0.01 uIU/mL. Performed at [facility], [address] [place], [place] [address]     VAS [place] UPPER EXTREMITY VENOUS DUPLEX  Result Date: 09/15/2023 UPPER VENOUS STUDY  Patient Name:  Laurie [name]  Date of Exam:   09/15/2023 Medical Rec #: [id number]      Accession #:    [id number] Date of Birth: 1996/10/18      Patient Gender: F Patient Age:   76 years Exam Location:  [facility] Procedure:      VAS [place] UPPER EXTREMITY VENOUS DUPLEX Referring Phys: Eulah Pont SUNDIL  --------------------------------------------------------------------------------  Indications: Filling defects w/CT Risk Factors: None identified. Limitations: Open wound. Comparison Study: 09/14/2024 - CT Extrem Up Entire Arm R W/CM                   IMPRESSION:                   1. Diffuse soft tissue thickening involving the right upper                   extremity with areas of nodular thickening in the skin. No                   abscess                   is seen.                   2. Filling defects in the subclavian, axillary, and basilic                   veins                   suspicious for deep venous thrombosis. Ultrasound is                   recommended for                   further evaluation.  3. No acute osseous abnormality. Performing Technologist: Chanda Busing RVT  Examination Guidelines: A complete evaluation includes B-mode imaging, spectral Doppler, color Doppler, and power Doppler as needed of all accessible portions of each vessel. Bilateral testing is considered an integral part of a complete examination. Limited examinations for reoccurring indications may be performed as noted.  Right Findings: +----------+------------+---------+-----------+----------+-------+ RIGHT     CompressiblePhasicitySpontaneousPropertiesSummary +----------+------------+---------+-----------+----------+-------+ IJV           Full       Yes       Yes                      +----------+------------+---------+-----------+----------+-------+ Subclavian    None       No        No                Acute  +----------+------------+---------+-----------+----------+-------+ Axillary      None       No        No                Acute  +----------+------------+---------+-----------+----------+-------+ Brachial    Partial      No        No                Acute  +----------+------------+---------+-----------+----------+-------+ Radial        Full                                           +----------+------------+---------+-----------+----------+-------+ Ulnar         Full                                          +----------+------------+---------+-----------+----------+-------+ Cephalic      None                                   Acute  +----------+------------+---------+-----------+----------+-------+ Basilic       None                                   Acute  +----------+------------+---------+-----------+----------+-------+  Left Findings: +----------+------------+---------+-----------+----------+-------+ LEFT      CompressiblePhasicitySpontaneousPropertiesSummary +----------+------------+---------+-----------+----------+-------+ Subclavian    Full       Yes       Yes                      +----------+------------+---------+-----------+----------+-------+  Summary:  Right: Findings consistent with acute deep vein thrombosis involving the right subclavian vein, right axillary vein and right brachial veins. Findings consistent with acute superficial vein thrombosis involving the right basilic vein and right cephalic vein.  Left: No evidence of thrombosis in the subclavian.  *See table(s) above for measurements and observations.     Preliminary    US Abdomen Complete  Result Date: 09/15/2023 CLINICAL DATA:  27 year old female with chest pain and shortness of breath. EXAM: ABDOMEN ULTRASOUND COMPLETE COMPARISON:  CTA chest 0342 hours today. FINDINGS: Gallbladder: Partially contracted. This appears to account for the appearance of the gallbladder wall on image 3. No sludge or stones identified. No sonographic Murphy sign elicited. Common bile  duct: Diameter: Could not be visualized. Liver: No focal lesion identified. Within normal limits in parenchymal echogenicity. Portal vein is patent on color Doppler imaging with normal direction of blood flow towards the liver. IVC: No abnormality visualized. Pancreas: Visualized portion unremarkable. Spleen:  Splenomegaly. Estimated splenic volume 695 mL (normal splenic volume range 83 - 412 mL). No discrete splenic lesion. Right Kidney: Length: 11.4 cm. Echogenicity within normal limits. No mass or hydronephrosis visualized. Left Kidney: Length: 12.6 cm. Echogenicity within normal limits. No mass or hydronephrosis visualized. Abdominal aorta: No aneurysm visualized. Other findings: No free fluid identified. IMPRESSION: 1. Splenomegaly. 2. Partially contracted gallbladder. No evidence of cholelithiasis or acute cholecystitis. 3. Common bile duct could not be visualized but otherwise negative ultrasound appearance of the liver. Electronically Signed   By: Odessa Fleming M.D.   On: 09/15/2023 07:14   CT Extrem Up Entire Arm R W/CM  Result Date: 09/15/2023 CLINICAL DATA:  Soft tissue mass, forearm, concern for abscess. EXAM: CT OF THE UPPER RIGHT EXTREMITY WITH CONTRAST TECHNIQUE: Multidetector CT imaging of the upper right extremity was performed according to the standard protocol following intravenous contrast administration. RADIATION DOSE REDUCTION: This exam was performed according to the departmental dose-optimization program which includes automated exposure control, adjustment of the mA and/or kV according to patient size and/or use of iterative reconstruction technique. CONTRAST:  OMNIPAQUE IOHEXOL 350 MG/ML SOLN COMPARISON:  None Available. FINDINGS: Bones/Joint/Cartilage No acute fracture or dislocation is seen. No bony erosions or periosteal elevation is identified. No joint effusion is identified. Ligaments Suboptimally assessed by CT. Muscles and Tendons No intramuscular abscess or edema. Soft tissues Diffuse subcutaneous edema is noted in the right upper extremity. No subcutaneous emphysema is seen. Focal subcutaneous nodular densities are noted in the dermis at the antecubital space, mid forearm, and wrist. No abscess is seen. Filling defects are present in the basilic, axillary, and subclavian veins.  There is suboptimal opacification of the radial and ulnar veins. No other acute abnormality is seen. IMPRESSION: 1. Diffuse soft tissue thickening involving the right upper extremity with areas of nodular thickening in the skin. No abscess is seen. 2. Filling defects in the subclavian, axillary, and basilic veins suspicious for deep venous thrombosis. Ultrasound is recommended for further evaluation. 3. No acute osseous abnormality. Critical Value/emergent results were called by telephone at the time of interpretation on 09/15/2023 at 4:53 am to provider [facility] , who verbally acknowledged these results. Electronically Signed   By: Thornell Sartorius M.D.   On: 09/15/2023 04:55   CT Angio Chest PE W and/or Wo Contrast  Result Date: 09/15/2023 CLINICAL DATA:  Pulmonary embolism suspected, high probability. Chest pain and shortness of breath. Family history of factor 5 Leiden. EXAM: CT ANGIOGRAPHY CHEST WITH CONTRAST TECHNIQUE: Multidetector CT imaging of the chest was performed using the standard protocol during bolus administration of intravenous contrast. Multiplanar CT image reconstructions and MIPs were obtained to evaluate the vascular anatomy. RADIATION DOSE REDUCTION: This exam was performed according to the departmental dose-optimization program which includes automated exposure control, adjustment of the mA and/or kV according to patient size and/or use of iterative reconstruction technique. CONTRAST:  OMNIPAQUE IOHEXOL 350 MG/ML SOLN COMPARISON:  None Available. FINDINGS: Cardiovascular: The heart is normal in size and there is no pericardial effusion. The aorta and pulmonary trunk are normal in caliber. No evidence of pulmonary embolism is seen. Mediastinum/Nodes: No mediastinal or hilar lymphadenopathy is seen. Prominent lymph nodes are noted in the right axilla, likely  reactive. There is a hypodense nodule in the left lobe of the thyroid gland measuring 1.7 cm. The trachea and esophagus are  within normal limits. Lungs/Pleura: Central bronchial wall thickening is present bilaterally. Nodular opacities are noted in the right upper lobe measuring 1 cm, axial image 55 and 1 cm axial image 82. There is a 9 mm nodular opacity in the right lower lobe, axial image 109. 5 mm nodule is present in the left lower lobe, axial image 82. No effusion or pneumothorax is seen. Upper Abdomen: Hypervascular focus is present in the anterior right lobe of the liver, possible flash filling hemangioma. No acute abnormality is seen. Musculoskeletal: No acute osseous abnormality. Review of the MIP images confirms the above findings. IMPRESSION: 1. No evidence of pulmonary embolism. 2. Scattered nodular opacities in the lungs bilaterally measuring up to 1 cm, which may be infectious, inflammatory, or neoplastic. Non-contrast chest CT at 3-6 months is recommended. If the nodules are stable at time of repeat CT, then future CT at 18-24 months (from today's scan) is considered optional for low-risk patients, but is recommended for high-risk patients. This recommendation follows the consensus statement: Guidelines for Management of Incidental Pulmonary Nodules Detected on CT Images: From the Fleischner Society 2017; Radiology 2017; 284:228-243. 3. 1.7 cm left thyroid nodule. Nonemergent thyroid ultrasound is recommended. Electronically Signed   By: Thornell Sartorius M.D.   On: 09/15/2023 04:25   DG Chest Port 1 View  Result Date: 09/15/2023 CLINICAL DATA:  Possible sepsis. EXAM: PORTABLE CHEST 1 VIEW COMPARISON:  07/11/2014. FINDINGS: The heart size and mediastinal contours are within normal limits. No consolidation, effusion, or pneumothorax. No acute osseous abnormality. IMPRESSION: No active disease. Electronically Signed   By: Thornell Sartorius M.D.   On: 09/15/2023 01:48   DG Knee Complete 4 Views Right  Result Date: 09/15/2023 CLINICAL DATA:  Questionable sepsis EXAM: RIGHT KNEE - COMPLETE 4+ VIEW COMPARISON:  None  Available. FINDINGS: No evidence of fracture, dislocation, or joint effusion. No evidence of arthropathy or other focal bone abnormality. Soft tissues are unremarkable. IMPRESSION: Negative. Electronically Signed   By: Darliss Cheney M.D.   On: 09/15/2023 01:48   DG Elbow Complete Right  Result Date: 09/15/2023 CLINICAL DATA:  Possible sepsis. EXAM: RIGHT ELBOW - COMPLETE 3+ VIEW COMPARISON:  None Available. FINDINGS: There is no evidence of acute fracture, dislocation, or joint effusion. There is no evidence of arthropathy or other focal bone abnormality. Diffuse soft tissue swelling is seen. IMPRESSION: No acute osseous abnormality. Electronically Signed   By: Thornell Sartorius M.D.   On: 09/15/2023 01:48    Review of Systems  Constitutional:  Negative for chills, diaphoresis and fever.  HENT:  Negative for ear discharge, ear pain, hearing loss and tinnitus.   Eyes:  Negative for photophobia and pain.  Respiratory:  Negative for cough and shortness of breath.   Cardiovascular:  Negative for chest pain.  Gastrointestinal:  Negative for abdominal pain, nausea and vomiting.  Genitourinary:  Negative for dysuria, flank pain, frequency and urgency.  Musculoskeletal:  Positive for arthralgias (Right knee, bilateral FA). Negative for back pain, myalgias and neck pain.  Neurological:  Negative for dizziness and headaches.  Hematological:  Does not bruise/bleed easily.  Psychiatric/Behavioral:  The patient is not nervous/anxious.    Blood pressure 129/62, pulse 86, temperature 98.5 F (36.9 C), resp. rate 16, last menstrual period 07/29/2023, SpO2 99%, unknown if currently breastfeeding. Physical Exam Constitutional:      General: She is not  in acute distress.    Appearance: She is well-developed. She is not diaphoretic.  HENT:     Head: Normocephalic and atraumatic.  Eyes:     General: No scleral icterus.       Right eye: No discharge.        Left eye: No discharge.     Conjunctiva/sclera:  Conjunctivae normal.  Cardiovascular:     Rate and Rhythm: Normal rate and regular rhythm.  Pulmonary:     Effort: Pulmonary effort is normal. No respiratory distress.  Musculoskeletal:     Cervical back: Normal range of motion.     Comments: Bilateral shoulder, elbow, wrist, digits- Multiple scattered ulcerations, RUE edematous right FA volar FA nodules, mild diffuse TTP, no instability, no blocks to motion  Sens  Ax/R/M/U intact  Mot   Ax/ R/ PIN/ M/ AIN/ U intact  Rad 2+  RLE No traumatic wounds, ecchymosis, or rash  Mod TTP but able to AROM 15 degrees  No ankle effusion  Sens DPN, SPN, TN intact  Motor EHL, ext, flex, evers 5/5  DP 2+, PT 2+, No significant edema  Skin:    General: Skin is warm and dry.  Neurological:     Mental Status: She is alert.  Psychiatric:        Mood and Affect: Mood normal.        Behavior: Behavior normal.     Assessment/Plan: Right knee septic joint -- Plan I&D today with Dr. Blanchie Dessert. Please keep NPO. BUE lesions -- Will get CT LUE to r/o abscess but no obvious surgical lesions or septic joints present. Multiple medical problems including polysubstance use-include cocaine, amphetamine use disorder, polysubstance dependence, chronic smoking/tobacco use, hepatitis C-untreated, posttraumatic stress disorder, substance-induced mood disorder, chronic anxiety, chronic depression, and IV drug  -- per primary service    Freeman Caldron, PA-C Orthopedic Surgery [phone] 09/15/2023, 9:59 AM

## 2023-09-15 NOTE — Consult Note (Signed)
Aspiration results of her R knee are highly concerning for septic arthritis. Tentative plan is for urgent I&D of her knee today.   Formal consult to follow   Sheral Apley

## 2023-09-15 NOTE — Progress Notes (Signed)
PROGRESS NOTE Laurie [name]  ZOX:[id number] DOB: 11-11-1995 DOA: 09/14/2023 PCP: Pcp, No  Brief Narrative/Hospital Course: 27 y.o.f w/ IVDA and polysubstance use-including cocaine, amphetamine,fentanyl iv, factor V Leiden deficiency in family, chronic smoking/tobacco use, hepatitis C-untreated, PTSD, substance-induced mood disorder/chronic anxiety and depression elevated not feeling well, having central chest pain pressor/feeling tired, heavy., bilateral knee joint pain  x 5 days and multiple abscess in her arm.  Having subjective fever and not able to bear weight on right knee x 2 days.  In ED: Workup showed evidence of sepsis with leukocytosis, hypotension, tachycardia w/ anemia hemoglobin 7.7 baseline 7-8 RVP panel> negative. Labs> transaminitis with elevated AST 127, alkaline phosphatase 135 and elevated bilirubin 1.7 Elevated pro time and INR. UA amber appearance, hemoglobin small, protein 30 and trace leukocyte esterase, no bacteria. Negative pregnancy test. Troponin 5 wnl CXR, X-ray right kneE, X-ray right elbow>> no acute abnormalities CT extremity of the right arm>>Diffuse soft tissue thickening involving the right upper extremity with areas of nodular thickening in the skin. No abscess is seen. Filling defects in the subclavian, axillary, and basilic veins suspicious for deep venous thrombosis. CTA chest>> No P,E Scattered nodular opacities in the lungs bilaterally measuring up to 1 cm, which may be infectious, inflammatory, or neoplastic. Non-contrast chest CT at 3-6 months is recommended. 1.7 cm left thyroid nodule. Nonemergent thyroid ultrasound is recommended. S/P RT knee aspiration>> 60K WBC SEEN, w/ deep leukocoria , Orthopedcs Dr Eulah Pont consulted for washout Blood culture sentIn the ED right knee joint aspiration has been done by Dr. Madilyn Hook and fluid has been sent to cell count, Gram stain and culture. Blood culture sent, heparin drip started patient admitted for right knee septic  arthritis, bilateral forearm abscess/cellulitis < right upper extremity DVT septic lung emboli transaminitis, placed on IV meropenem (history of allergy to Keflex and penicillin).   Subjective: Patient seen and examined this morning Alert awake oriented complains of pain on her extremities Just had CT scan of left upper extremity   Assessment and Plan: Principal Problem:   Sepsis (HCC) Active Problems:   Abscess of upper extremity   Septic arthritis of knee, right (HCC)   Multiple lung nodules-secondary to septic emboli   Acute deep vein thrombosis (DVT) of axillary vein of right upper extremity (HCC)   Microcytic anemia   Transaminitis   IVDU (intravenous drug user)   Thyroid nodule   Tobacco dependence due to cigarettes   Patient seen and examined personally, I reviewed the chart, history and physical and admission note, done by admitting physician this morning and agree with the same with following addendum.  Please refer to the morning admission note for more detailed plan of care.  Severe sepsis POA Right knee septic arthritis with deep leukocoria and Gram stain Bilateral upper extremities abscess/cellulitis Septic lung emboli: Vitals appear stable currently afebrile.  Lactic acid was normal.  Given meropenem, vancomycin> switched to ceftriaxone and vancomycin as per ID. Dr. Eulah Pont from Ortho has been consulted for septic right knee. Follow GC, consulted ID-left UE CT with contrast obtained showed soft tissue swelling scattered air bubbles within the dorsal and radial aspect of the distal forearm consistent with cellulitis no drainable fluid collection.Follow echocardiogram TTE high risk for endocarditis.  High risk for decompensation monitoring in stepdown unit  Acute DVT in subclavian axillary and basilic vein based on CT extremities: follow-up ultrasound report, continue heparin drip for now factor V Leiden deficiency and family hypercoagulable workup in  process.  IVDA Polysubstance abuse:  UDS positive for amphetamine, opiates, cocaine and THC.  Admits using IV fentanyl regularly. Consulted psych-patient  Thyroid nodules: Nonemergent thyroid ultrasound is recommended at some point  Substance-induced mood disorder, chronic anxiety/depression: Resume home oral Bactrim propranolol ( if bp allowes)  Chronic microcytic anemia: Hemoglobin stable.  Monitor while on anticoagulation.  Type and screen has been ordered transfuse if less than 7 g  Transaminitis Hepatitis C history: AST normal but ALT elevated ALT slightly up to be, likely from sepsis and untreated hepatitis C.  Monitor  DVT prophylaxis: SCDs Start: 09/15/23 0531 Place TED hose Start: 09/15/23 0531 Code Status:   Code Status: Full Code Family Communication: plan of care discussed with patient at bedside. Patient status is: Inpatient because of sepsis Level of care: Stepdown   Dispo: The patient is from: home            Anticipated disposition: TBD Objective: Vitals last 24 hrs: Vitals:   09/15/23 1026 09/15/23 1045 09/15/23 1130 09/15/23 1200  BP: (!) 96/47 (!) 114/53 124/61 134/71  Pulse: 71  69 73  Resp: 11 12 16 18   Temp: 98.4 F (36.9 C)     TempSrc: Oral     SpO2: 98%  100% 100%   Weight change:   Physical Examination: General exam: alert awake, older than stated age HEENT:Oral mucosa moist, Ear/Nose WNL grossly Respiratory system: bilaterally clear BS, no use of accessory muscle Cardiovascular system: S1 & S2 +, No JVD. Gastrointestinal system: Abdomen soft,NT,ND, BS+ Nervous System:Alert, awake, moving extremities. Extremities: LE edema neg,distal peripheral pulses palpable.  Skin: No rashes,no icterus. skin lesions at various stages of healing. MSK: Normal muscle bulk,tone, power  Medications reviewed:  Scheduled Meds:  sodium chloride   Intravenous Once   ferrous sulfate  325 mg Oral Q breakfast   nicotine  21 mg Transdermal Daily   sodium  chloride flush  3 mL Intravenous Q12H   sodium chloride flush  3 mL Intravenous Q12H  Continuous Infusions:  sodium chloride     cefTRIAXone (ROCEPHIN)  IV     lactated ringers     lactated ringers 125 mL/hr at 09/15/23 0848   vancomycin      Diet Order             Diet NPO time specified Except for: Sips with Meds  Diet effective now                  No intake or output data in the 24 hours ending 09/15/23 1236 Net IO Since Admission: No IO data has been entered for this period [09/15/23 1236]  Wt Readings from Last 3 Encounters:  09/12/23 54.4 kg  05/03/20 64.4 kg  03/08/20 64.4 kg     Unresulted Labs (From admission, onward)     Start     Ordered   09/16/23 0500  CBC  Daily,   R      09/15/23 0508   09/16/23 0500  Comprehensive metabolic panel  Daily,   R      09/15/23 0532   09/15/23 1300  Hemoglobin and hematocrit, blood  3 times daily,   R (with TIMED occurrences)      09/15/23 0629   09/15/23 0634  HCV RNA quant rflx ultra or genotyp  Once,   R        09/15/23 0633   09/15/23 0621  RPR  (GC, Chlamydia, RPR Panel (PNL))  Once,   R        09/15/23  2440   09/15/23 0552  Antithrombin III  (Hypercoagulable Panel, Comprehensive (PNL))  Add-on,   AD        09/15/23 0551   09/15/23 0552  Protein C activity  (Hypercoagulable Panel, Comprehensive (PNL))  Add-on,   AD        09/15/23 0551   09/15/23 0552  Protein C, total  (Hypercoagulable Panel, Comprehensive (PNL))  Add-on,   AD        09/15/23 0551   09/15/23 0552  Protein S activity  (Hypercoagulable Panel, Comprehensive (PNL))  Add-on,   AD        09/15/23 0551   09/15/23 0552  Protein S, total  (Hypercoagulable Panel, Comprehensive (PNL))  Add-on,   AD        09/15/23 0551   09/15/23 0552  Lupus anticoagulant panel  (Hypercoagulable Panel, Comprehensive (PNL))  Once,   R        09/15/23 0551   09/15/23 0552  Beta-2-glycoprotein i abs, IgG/M/A  (Hypercoagulable Panel, Comprehensive (PNL))  Add-on,   AD         09/15/23 0551   09/15/23 0552  Homocysteine, serum  (Hypercoagulable Panel, Comprehensive (PNL))  Add-on,   AD        09/15/23 0551   09/15/23 0552  Factor 5 leiden  (Hypercoagulable Panel, Comprehensive (PNL))  Add-on,   AD        09/15/23 0551   09/15/23 0552  Prothrombin gene mutation  (Hypercoagulable Panel, Comprehensive (PNL))  Add-on,   AD        09/15/23 0551   09/15/23 0552  Cardiolipin antibodies, IgG, IgM, IgA  (Hypercoagulable Panel, Comprehensive (PNL))  Once,   R        09/15/23 0551   09/15/23 0532  Hepatitis panel, acute  Once,   R        09/15/23 0532   09/15/23 0530  HIV Antibody (routine testing w rflx)  (HIV Antibody (Routine testing w reflex) panel)  Once,   R        09/15/23 0532   09/15/23 0237  Pathologist smear review  Once,   R        09/15/23 0237   09/15/23 0228  Glucose, Body Fluid Other  ([facility] ED BODY FLUID PANEL)  ONCE - STAT,   URGENT        09/15/23 0228           Data Reviewed: I have personally reviewed following labs and imaging studies CBC: Recent Labs  Lab 09/15/23 0045  WBC 11.0*  NEUTROABS 8.9*  HGB 7.7*  HCT 23.6*  MCV 74.9*  PLT 211   Basic Metabolic Panel:  Recent Labs  Lab 09/15/23 0045  NA 135  K 3.5  CL 102  CO2 22  GLUCOSE 123*  BUN 19  CREATININE 0.90  CALCIUM 7.7*   GFR: Estimated Creatinine Clearance: 77 mL/min (by C-G formula based on SCr of 0.9 mg/dL). Liver Function Tests:  Recent Labs  Lab 09/15/23 0045  AST 34  ALT 127*  ALKPHOS 135*  BILITOT 1.7*  PROT 7.0  ALBUMIN 2.5*   No results for input(s): "LIPASE", "AMYLASE" in the last 168 hours. No results for input(s): "AMMONIA" in the last 168 hours. Coagulation Profile:  Recent Labs  Lab 09/15/23 0045  INR 1.4*   No results for input(s): "PROBNP" in the last 168 hours.  No results for input(s): "HGBA1C" in the last 72 hours. No results for input(s): "GLUCAP" in the  last 168 hours. No results for input(s): "CHOL", "HDL", "LDLCALC", "TRIG",  "CHOLHDL", "LDLDIRECT" in the last 72 hours. Recent Labs    09/15/23 0812  TSH 1.366   Sepsis Labs: Recent Labs  Lab 09/15/23 0143  LATICACIDVEN 1.2    Recent Results (from the past 240 hour(s))  Resp panel by RT-PCR (RSV, Flu A&B, Covid) Urine, Clean Catch     Status: None   Collection Time: 09/15/23 12:45 AM   Specimen: Urine, Clean Catch; Nasal Swab  Result Value Ref Range Status   SARS Coronavirus 2 by RT PCR NEGATIVE NEGATIVE Final    Comment: (NOTE) SARS-CoV-2 target nucleic acids are NOT DETECTED.  The SARS-CoV-2 RNA is generally detectable in upper respiratory specimens during the acute phase of infection. The lowest concentration of SARS-CoV-2 viral copies this assay can detect is 138 copies/mL. A negative result does not preclude SARS-Cov-2 infection and should not be used as the sole basis for treatment or other patient management decisions. A negative result may occur with  improper specimen collection/handling, submission of specimen other than nasopharyngeal swab, presence of viral mutation(s) within the areas targeted by this assay, and inadequate number of viral copies(<138 copies/mL). A negative result must be combined with clinical observations, patient history, and epidemiological information. The expected result is Negative.  Fact Sheet for Patients:  BloggerCourse.com  Fact Sheet for Healthcare Providers:  SeriousBroker.it  This test is no t yet approved or cleared by the [place] FDA and  has been authorized for detection and/or diagnosis of SARS-CoV-2 by FDA under an Emergency Use Authorization (EUA). This EUA will remain  in effect (meaning this test can be used) for the duration of the COVID-19 declaration under Section 564(b)(1) of the Act, 21 U.S.C.section 360bbb-3(b)(1), unless the authorization is terminated  or revoked sooner.       Influenza A by PCR NEGATIVE NEGATIVE Final    Influenza B by PCR NEGATIVE NEGATIVE Final    Comment: (NOTE) The Xpert Xpress SARS-CoV-2/FLU/RSV plus assay is intended as an aid in the diagnosis of influenza from Nasopharyngeal swab specimens and should not be used as a sole basis for treatment. Nasal washings and aspirates are unacceptable for Xpert Xpress SARS-CoV-2/FLU/RSV testing.  Fact Sheet for Patients: BloggerCourse.com  Fact Sheet for Healthcare Providers: SeriousBroker.it  This test is not yet approved or cleared by the [place] FDA and has been authorized for detection and/or diagnosis of SARS-CoV-2 by FDA under an Emergency Use Authorization (EUA). This EUA will remain in effect (meaning this test can be used) for the duration of the COVID-19 declaration under Section 564(b)(1) of the Act, 21 U.S.C. section 360bbb-3(b)(1), unless the authorization is terminated or revoked.     Resp Syncytial Virus by PCR NEGATIVE NEGATIVE Final    Comment: (NOTE) Fact Sheet for Patients: BloggerCourse.com  Fact Sheet for Healthcare Providers: SeriousBroker.it  This test is not yet approved or cleared by the [place] FDA and has been authorized for detection and/or diagnosis of SARS-CoV-2 by FDA under an Emergency Use Authorization (EUA). This EUA will remain in effect (meaning this test can be used) for the duration of the COVID-19 declaration under Section 564(b)(1) of the Act, 21 U.S.C. section 360bbb-3(b)(1), unless the authorization is terminated or revoked.  Performed at [facility], [address] [place], [place] [address]   Blood Culture (routine x 2)     Status: None (Preliminary result)   Collection Time: 09/15/23 12:45 AM   Specimen: BLOOD LEFT FOREARM  Result Value Ref Range Status   Specimen Description BLOOD LEFT FOREARM  Final   Special Requests   Final    BOTTLES DRAWN AEROBIC  AND ANAEROBIC Blood Culture adequate volume Performed at [facility], [address], [place], [place] [address]    Culture PENDING  Incomplete   Report Status PENDING  Incomplete  Blood Culture (routine x 2)     Status: None (Preliminary result)   Collection Time: 09/15/23  1:03 AM   Specimen: BLOOD LEFT ARM  Result Value Ref Range Status   Specimen Description BLOOD LEFT ARM  Final   Special Requests   Final    BOTTLES DRAWN AEROBIC AND ANAEROBIC Blood Culture adequate volume Performed at [facility], [address], [place], [place] [address]    Culture PENDING  Incomplete   Report Status PENDING  Incomplete  Aerobic/Anaerobic Culture w Gram Stain (surgical/deep wound)     Status: None (Preliminary result)   Collection Time: 09/15/23  1:43 AM   Specimen: BLOOD LEFT FOREARM; Abscess  Result Value Ref Range Status   Specimen Description   Final    BLOOD LEFT FOREARM Performed at [facility], [address], [place], [place] [address]    Special Requests   Final    NONE Performed at [facility], [address] [place], [place] [address]    Gram Stain   Final    FEW WBC PRESENT, PREDOMINANTLY MONONUCLEAR FEW GRAM POSITIVE COCCI IN PAIRS AND CHAINS FEW GRAM VARIABLE ROD Performed at [facility], [address], [place], [place] [address]    Culture PENDING  Incomplete   Report Status PENDING  Incomplete  Body fluid culture w Gram Stain     Status: None (Preliminary result)   Collection Time: 09/15/23  2:37 AM   Specimen: KNEE; Body Fluid  Result Value Ref Range Status   Specimen Description   Final    KNEE RIGHT Performed at [facility], [address] [place], [place] [address]    Special Requests   Final    NONE Performed at [facility], [address] [place], [place] [address]    Gram Stain   Final    FEW WBC PRESENT,BOTH PMN AND MONONUCLEAR RARE GRAM POSITIVE  COCCI IN PAIRS Performed at [facility], [address], [place], [place] [address]    Culture PENDING  Incomplete   Report Status PENDING  Incomplete    Antimicrobials: Anti-infectives (From admission, onward)    Start     Dose/Rate Route Frequency Ordered Stop   09/15/23 1800  vancomycin (VANCOREADY) IVPB 750 mg/150 mL        750 mg 150 mL/hr over 60 Minutes Intravenous Every 12 hours 09/15/23 0530     09/15/23 1600  cefTRIAXone (ROCEPHIN) 2 g in sodium chloride 0.9 % 100 mL IVPB        2 g 200 mL/hr over 30 Minutes Intravenous Every 24 hours 09/15/23 1048     09/15/23 0900  meropenem (MERREM) 1 g in sodium chloride 0.9 % 100 mL IVPB  Status:  Discontinued        1 g 200 mL/hr over 30 Minutes Intravenous Every 8 hours 09/15/23 0534 09/15/23 1004   09/15/23 0530  vancomycin (VANCOCIN) IVPB 1000 mg/200 mL premix        1,000 mg 200 mL/hr over 60 Minutes Intravenous  Once 09/15/23 0528 09/15/23 0849  09/15/23 0045  meropenem (MERREM) 1 g in sodium chloride 0.9 % 100 mL IVPB        1 g 200 mL/hr over 30 Minutes Intravenous  Once 09/15/23 0032 09/15/23 0149      Culture/Microbiology    Component Value Date/Time   SDES  09/15/2023 0237    KNEE RIGHT Performed at [facility], [address] [place], [place] [address]    SPECREQUEST  09/15/2023 6045    NONE Performed at [facility], [address], [place], [place] [address]    CULT PENDING 09/15/2023 0237   REPTSTATUS PENDING 09/15/2023 0237    Other culture-see note  Radiology Studies: CT FOREARM LEFT W CONTRAST  Result Date: 09/15/2023 CLINICAL DATA:  Soft tissue infection suspected, forearm, no prior imaging. History of polysubstance abuse. EXAM: CT OF THE UPPER LEFT EXTREMITY WITH CONTRAST TECHNIQUE: Multidetector CT imaging of the left forearm was performed according to the standard protocol following intravenous contrast administration. RADIATION DOSE REDUCTION: This  exam was performed according to the departmental dose-optimization program which includes automated exposure control, adjustment of the mA and/or kV according to patient size and/or use of iterative reconstruction technique. CONTRAST:  50mL OMNIPAQUE IOHEXOL 300 MG/ML  SOLN COMPARISON:  None Available. FINDINGS: Bones/Joint/Cartilage No evidence of acute fracture, dislocation or osteomyelitis. No significant elbow or wrist joint effusions demonstrated. The joint spaces appear preserved. Ligaments Suboptimally assessed by CT. Muscles and Tendons No focal intramuscular fluid collection or abnormal enhancement identified. No significant tenosynovitis or tendon sheath air is seen. Soft tissues Soft tissue swelling and scattered air bubbles within the dorsal and radial aspect of the distal forearm. There is a small amount of surrounding ill-defined fluid, but no drainable collections are identified. No evidence of foreign body. No acute vascular findings are identified. IMPRESSION: 1. Soft tissue swelling and scattered air bubbles within the dorsal and radial aspect of the distal forearm consistent with cellulitis. No drainable collections identified. 2. No CT evidence of osteomyelitis or septic arthritis. Electronically Signed   By: Carey Bullocks M.D.   On: 09/15/2023 11:24   VAS [place] UPPER EXTREMITY VENOUS DUPLEX  Result Date: 09/15/2023 UPPER VENOUS STUDY  Patient Name:  MATILYN OLINSKI Runkles  Date of Exam:   09/15/2023 Medical Rec #: [id number]      Accession #:    [id number] Date of Birth: 1996/09/26      Patient Gender: F Patient Age:   33 years Exam Location:  [facility] Procedure:      VAS [place] UPPER EXTREMITY VENOUS DUPLEX Referring Phys: Eulah Pont SUNDIL --------------------------------------------------------------------------------  Indications: Filling defects w/CT Risk Factors: None identified. Limitations: Open wound. Comparison Study: 09/14/2024 - CT Extrem Up Entire Arm R W/CM                    IMPRESSION:                   1. Diffuse soft tissue thickening involving the right upper                   extremity with areas of nodular thickening in the skin. No                   abscess                   is seen.  2. Filling defects in the subclavian, axillary, and basilic                   veins                   suspicious for deep venous thrombosis. Ultrasound is                   recommended for                   further evaluation.                   3. No acute osseous abnormality. Performing Technologist: Chanda Busing RVT  Examination Guidelines: A complete evaluation includes B-mode imaging, spectral Doppler, color Doppler, and power Doppler as needed of all accessible portions of each vessel. Bilateral testing is considered an integral part of a complete examination. Limited examinations for reoccurring indications may be performed as noted.  Right Findings: +----------+------------+---------+-----------+----------+-------+ RIGHT     CompressiblePhasicitySpontaneousPropertiesSummary +----------+------------+---------+-----------+----------+-------+ IJV           Full       Yes       Yes                      +----------+------------+---------+-----------+----------+-------+ Subclavian    None       No        No                Acute  +----------+------------+---------+-----------+----------+-------+ Axillary      None       No        No                Acute  +----------+------------+---------+-----------+----------+-------+ Brachial    Partial      No        No                Acute  +----------+------------+---------+-----------+----------+-------+ Radial        Full                                          +----------+------------+---------+-----------+----------+-------+ Ulnar         Full                                          +----------+------------+---------+-----------+----------+-------+ Cephalic      None                                    Acute  +----------+------------+---------+-----------+----------+-------+ Basilic       None                                   Acute  +----------+------------+---------+-----------+----------+-------+  Left Findings: +----------+------------+---------+-----------+----------+-------+ LEFT      CompressiblePhasicitySpontaneousPropertiesSummary +----------+------------+---------+-----------+----------+-------+ Subclavian    Full       Yes       Yes                      +----------+------------+---------+-----------+----------+-------+  Summary:  Right: Findings consistent with acute deep vein thrombosis involving the  right subclavian vein, right axillary vein and right brachial veins. Findings consistent with acute superficial vein thrombosis involving the right basilic vein and right cephalic vein.  Left: No evidence of thrombosis in the subclavian.  *See table(s) above for measurements and observations.     Preliminary    US Abdomen Complete  Result Date: 09/15/2023 CLINICAL DATA:  27 year old female with chest pain and shortness of breath. EXAM: ABDOMEN ULTRASOUND COMPLETE COMPARISON:  CTA chest 0342 hours today. FINDINGS: Gallbladder: Partially contracted. This appears to account for the appearance of the gallbladder wall on image 3. No sludge or stones identified. No sonographic Murphy sign elicited. Common bile duct: Diameter: Could not be visualized. Liver: No focal lesion identified. Within normal limits in parenchymal echogenicity. Portal vein is patent on color Doppler imaging with normal direction of blood flow towards the liver. IVC: No abnormality visualized. Pancreas: Visualized portion unremarkable. Spleen: Splenomegaly. Estimated splenic volume 695 mL (normal splenic volume range 83 - 412 mL). No discrete splenic lesion. Right Kidney: Length: 11.4 cm. Echogenicity within normal limits. No mass or hydronephrosis visualized. Left Kidney: Length: 12.6 cm.  Echogenicity within normal limits. No mass or hydronephrosis visualized. Abdominal aorta: No aneurysm visualized. Other findings: No free fluid identified. IMPRESSION: 1. Splenomegaly. 2. Partially contracted gallbladder. No evidence of cholelithiasis or acute cholecystitis. 3. Common bile duct could not be visualized but otherwise negative ultrasound appearance of the liver. Electronically Signed   By: Odessa Fleming M.D.   On: 09/15/2023 07:14   CT Extrem Up Entire Arm R W/CM  Result Date: 09/15/2023 CLINICAL DATA:  Soft tissue mass, forearm, concern for abscess. EXAM: CT OF THE UPPER RIGHT EXTREMITY WITH CONTRAST TECHNIQUE: Multidetector CT imaging of the upper right extremity was performed according to the standard protocol following intravenous contrast administration. RADIATION DOSE REDUCTION: This exam was performed according to the departmental dose-optimization program which includes automated exposure control, adjustment of the mA and/or kV according to patient size and/or use of iterative reconstruction technique. CONTRAST:  OMNIPAQUE IOHEXOL 350 MG/ML SOLN COMPARISON:  None Available. FINDINGS: Bones/Joint/Cartilage No acute fracture or dislocation is seen. No bony erosions or periosteal elevation is identified. No joint effusion is identified. Ligaments Suboptimally assessed by CT. Muscles and Tendons No intramuscular abscess or edema. Soft tissues Diffuse subcutaneous edema is noted in the right upper extremity. No subcutaneous emphysema is seen. Focal subcutaneous nodular densities are noted in the dermis at the antecubital space, mid forearm, and wrist. No abscess is seen. Filling defects are present in the basilic, axillary, and subclavian veins. There is suboptimal opacification of the radial and ulnar veins. No other acute abnormality is seen. IMPRESSION: 1. Diffuse soft tissue thickening involving the right upper extremity with areas of nodular thickening in the skin. No abscess is seen. 2.  Filling defects in the subclavian, axillary, and basilic veins suspicious for deep venous thrombosis. Ultrasound is recommended for further evaluation. 3. No acute osseous abnormality. Critical Value/emergent results were called by telephone at the time of interpretation on 09/15/2023 at 4:53 am to provider [facility] , who verbally acknowledged these results. Electronically Signed   By: Thornell Sartorius M.D.   On: 09/15/2023 04:55   CT Angio Chest PE W and/or Wo Contrast  Result Date: 09/15/2023 CLINICAL DATA:  Pulmonary embolism suspected, high probability. Chest pain and shortness of breath. Family history of factor 5 Leiden. EXAM: CT ANGIOGRAPHY CHEST WITH CONTRAST TECHNIQUE: Multidetector CT imaging of the chest was performed using the standard protocol during bolus  administration of intravenous contrast. Multiplanar CT image reconstructions and MIPs were obtained to evaluate the vascular anatomy. RADIATION DOSE REDUCTION: This exam was performed according to the departmental dose-optimization program which includes automated exposure control, adjustment of the mA and/or kV according to patient size and/or use of iterative reconstruction technique. CONTRAST:  OMNIPAQUE IOHEXOL 350 MG/ML SOLN COMPARISON:  None Available. FINDINGS: Cardiovascular: The heart is normal in size and there is no pericardial effusion. The aorta and pulmonary trunk are normal in caliber. No evidence of pulmonary embolism is seen. Mediastinum/Nodes: No mediastinal or hilar lymphadenopathy is seen. Prominent lymph nodes are noted in the right axilla, likely reactive. There is a hypodense nodule in the left lobe of the thyroid gland measuring 1.7 cm. The trachea and esophagus are within normal limits. Lungs/Pleura: Central bronchial wall thickening is present bilaterally. Nodular opacities are noted in the right upper lobe measuring 1 cm, axial image 55 and 1 cm axial image 82. There is a 9 mm nodular opacity in the right lower  lobe, axial image 109. 5 mm nodule is present in the left lower lobe, axial image 82. No effusion or pneumothorax is seen. Upper Abdomen: Hypervascular focus is present in the anterior right lobe of the liver, possible flash filling hemangioma. No acute abnormality is seen. Musculoskeletal: No acute osseous abnormality. Review of the MIP images confirms the above findings. IMPRESSION: 1. No evidence of pulmonary embolism. 2. Scattered nodular opacities in the lungs bilaterally measuring up to 1 cm, which may be infectious, inflammatory, or neoplastic. Non-contrast chest CT at 3-6 months is recommended. If the nodules are stable at time of repeat CT, then future CT at 18-24 months (from today's scan) is considered optional for low-risk patients, but is recommended for high-risk patients. This recommendation follows the consensus statement: Guidelines for Management of Incidental Pulmonary Nodules Detected on CT Images: From the Fleischner Society 2017; Radiology 2017; 284:228-243. 3. 1.7 cm left thyroid nodule. Nonemergent thyroid ultrasound is recommended. Electronically Signed   By: Thornell Sartorius M.D.   On: 09/15/2023 04:25   DG Chest Port 1 View  Result Date: 09/15/2023 CLINICAL DATA:  Possible sepsis. EXAM: PORTABLE CHEST 1 VIEW COMPARISON:  07/11/2014. FINDINGS: The heart size and mediastinal contours are within normal limits. No consolidation, effusion, or pneumothorax. No acute osseous abnormality. IMPRESSION: No active disease. Electronically Signed   By: Thornell Sartorius M.D.   On: 09/15/2023 01:48   DG Knee Complete 4 Views Right  Result Date: 09/15/2023 CLINICAL DATA:  Questionable sepsis EXAM: RIGHT KNEE - COMPLETE 4+ VIEW COMPARISON:  None Available. FINDINGS: No evidence of fracture, dislocation, or joint effusion. No evidence of arthropathy or other focal bone abnormality. Soft tissues are unremarkable. IMPRESSION: Negative. Electronically Signed   By: Darliss Cheney M.D.   On: 09/15/2023 01:48    DG Elbow Complete Right  Result Date: 09/15/2023 CLINICAL DATA:  Possible sepsis. EXAM: RIGHT ELBOW - COMPLETE 3+ VIEW COMPARISON:  None Available. FINDINGS: There is no evidence of acute fracture, dislocation, or joint effusion. There is no evidence of arthropathy or other focal bone abnormality. Diffuse soft tissue swelling is seen. IMPRESSION: No acute osseous abnormality. Electronically Signed   By: Thornell Sartorius M.D.   On: 09/15/2023 01:48     LOS: 0 days   Total time spent in review of labs and imaging, patient evaluation, formulation of plan, documentation and communication with family:  minutes  Lanae Boast, MD [facility]  09/15/2023, 12:36 PM

## 2023-09-15 NOTE — Progress Notes (Signed)
Right upper extremity venous duplex has been completed. Preliminary results can be found in CV Proc through chart review.  Results were given to the patient's nurse, Mardella Layman.  09/15/23 9:32 AM Olen Cordial RVT

## 2023-09-15 NOTE — Progress Notes (Signed)
Pt being followed by [facility] for Sepsis protocol. 

## 2023-09-15 NOTE — ED Notes (Signed)
ED TO INPATIENT HANDOFF REPORT  Name/Age/Gender Laurie [name] 27 y.o. female  Code Status    Code Status Orders  (From admission, onward)           Start     Ordered   09/15/23 0532  Full code  Continuous       Question:  By:  Answer:  Consent: discussion documented in EHR   09/15/23 0532           Code Status History     Date Active Date Inactive Code Status Order ID Comments User Context   03/09/2020 1020 03/10/2020 2048 Full Code [id number]  Venora Maples, MD Inpatient   03/08/2020 1154 03/09/2020 1019 Full Code [id number]  Kathrynn Running, MD Inpatient   06/05/2018 2006 06/08/2018 1954 Full Code [id number]  [name], MD Inpatient   05/28/2018 0259 05/28/2018 0750 Full Code [id number]  Farrel Conners, CNM Inpatient   11/13/2016 1819 12/24/2016 1228 Full Code [id number]  Marny Lowenstein, PA-C Inpatient   01/10/2016 0703 01/12/2016 0102 Full Code [id number]  Elson Areas, RN Inpatient       Home/SNF/Other Home  Chief Complaint Sepsis [facility]) [A41.9]  Level of Care/Admitting Diagnosis ED Disposition     ED Disposition  Admit   Condition  --   Comment  Hospital Area: [facility] [[id number]]  Level of Care: Stepdown [14]  Admit to SDU based on following criteria: Other see comments  Comments: Sepsis  May admit patient to [facility] or [facility] if equivalent level of care is available:: No  Covid Evaluation: Asymptomatic - no recent exposure (last 10 days) testing not required  Diagnosis: Sepsis [facility]) [[id number]]  Admitting Physician: Tereasa Coop [[id number]]  Attending Physician: Tereasa Coop [[id number]]  Certification:: I certify this patient will need inpatient services for at least 2 midnights  Expected Medical Readiness: 09/20/2023          Medical History Past Medical History:  Diagnosis Date   Anxiety    Asthma    "grew out"   Chlamydia    Depression    doing really good   Gestational diabetes    with first two  pregnancies   Hepatitis C 03/2015   History of heroin abuse (HCC)    Homelessness    Infection    UTI   Polysubstance abuse (HCC) 05/28/2018   UDS positive for cocaine, MJ, meth, opioids   Type A blood, Rh negative     Allergies Allergies  Allergen Reactions   Keflex [Cephalexin] Hives   Penicillins Other (See Comments)    Results thru an allergy skin test Has patient had a PCN reaction causing immediate rash, facial/tongue/throat swelling, SOB or lightheadedness with hypotension: No Has patient had a PCN reaction causing severe rash involving mucus membranes or skin necrosis: No Has patient had a PCN reaction that required hospitalization No Has patient had a PCN reaction occurring within the last 10 years: No If all of the above answers are "NO", then may proceed with Cephalosporin use.      IV Location/Drains/Wounds Patient Lines/Drains/Airways Status     Active Line/Drains/Airways     Name Placement date Placement time Site Days   Peripheral IV 09/15/23 20 G 1" Left Forearm 09/15/23  0030  Forearm  less than 1   Peripheral IV 09/15/23 20 G 1" Left;Upper Forearm 09/15/23  0050  Forearm  less than 1   Peripheral IV 09/15/23 20 G Left;Posterior Hand 09/15/23  4332  Hand  less than 1            Labs/Imaging Results for orders placed or performed during the hospital encounter of 09/14/23 (from the past 48 hour(s))  Resp panel by RT-PCR (RSV, Flu A&B, Covid) Urine, Clean Catch     Status: None   Collection Time: 09/15/23 12:45 AM   Specimen: Urine, Clean Catch; Nasal Swab  Result Value Ref Range   SARS Coronavirus 2 by RT PCR NEGATIVE NEGATIVE    Comment: (NOTE) SARS-CoV-2 target nucleic acids are NOT DETECTED.  The SARS-CoV-2 RNA is generally detectable in upper respiratory specimens during the acute phase of infection. The lowest concentration of SARS-CoV-2 viral copies this assay can detect is 138 copies/mL. A negative result does not preclude  SARS-Cov-2 infection and should not be used as the sole basis for treatment or other patient management decisions. A negative result may occur with  improper specimen collection/handling, submission of specimen other than nasopharyngeal swab, presence of viral mutation(s) within the areas targeted by this assay, and inadequate number of viral copies(<138 copies/mL). A negative result must be combined with clinical observations, patient history, and epidemiological information. The expected result is Negative.  Fact Sheet for Patients:  BloggerCourse.com  Fact Sheet for Healthcare Providers:  SeriousBroker.it  This test is no t yet approved or cleared by the [place] FDA and  has been authorized for detection and/or diagnosis of SARS-CoV-2 by FDA under an Emergency Use Authorization (EUA). This EUA will remain  in effect (meaning this test can be used) for the duration of the COVID-19 declaration under Section 564(b)(1) of the Act, 21 U.S.C.section 360bbb-3(b)(1), unless the authorization is terminated  or revoked sooner.       Influenza A by PCR NEGATIVE NEGATIVE   Influenza B by PCR NEGATIVE NEGATIVE    Comment: (NOTE) The Xpert Xpress SARS-CoV-2/FLU/RSV plus assay is intended as an aid in the diagnosis of influenza from Nasopharyngeal swab specimens and should not be used as a sole basis for treatment. Nasal washings and aspirates are unacceptable for Xpert Xpress SARS-CoV-2/FLU/RSV testing.  Fact Sheet for Patients: BloggerCourse.com  Fact Sheet for Healthcare Providers: SeriousBroker.it  This test is not yet approved or cleared by the [place] FDA and has been authorized for detection and/or diagnosis of SARS-CoV-2 by FDA under an Emergency Use Authorization (EUA). This EUA will remain in effect (meaning this test can be used) for the duration of the COVID-19  declaration under Section 564(b)(1) of the Act, 21 U.S.C. section 360bbb-3(b)(1), unless the authorization is terminated or revoked.     Resp Syncytial Virus by PCR NEGATIVE NEGATIVE    Comment: (NOTE) Fact Sheet for Patients: BloggerCourse.com  Fact Sheet for Healthcare Providers: SeriousBroker.it  This test is not yet approved or cleared by the [place] FDA and has been authorized for detection and/or diagnosis of SARS-CoV-2 by FDA under an Emergency Use Authorization (EUA). This EUA will remain in effect (meaning this test can be used) for the duration of the COVID-19 declaration under Section 564(b)(1) of the Act, 21 U.S.C. section 360bbb-3(b)(1), unless the authorization is terminated or revoked.  Performed at [facility], [address], [place], [place] [address]   Comprehensive metabolic panel     Status: Abnormal   Collection Time: 09/15/23 12:45 AM  Result Value Ref Range   Sodium 135 135 - 145 mmol/L   Potassium 3.5 3.5 - 5.1 mmol/L   Chloride 102 98 - 111 mmol/L   CO2  22 22 - 32 mmol/L   Glucose, Bld 123 (H) 70 - 99 mg/dL    Comment: Glucose reference range applies only to samples taken after fasting for at least 8 hours.   BUN 19 6 - 20 mg/dL   Creatinine, Ser 8.65 0.44 - 1.00 mg/dL   Calcium 7.7 (L) 8.9 - 10.3 mg/dL   Total Protein 7.0 6.5 - 8.1 g/dL   Albumin 2.5 (L) 3.5 - 5.0 g/dL   AST 34 15 - 41 U/L   ALT 127 (H) 0 - 44 U/L   Alkaline Phosphatase 135 (H) 38 - 126 U/L   Total Bilirubin 1.7 (H) <1.2 mg/dL   GFR, Estimated >78 >46 mL/min    Comment: (NOTE) Calculated using the CKD-EPI Creatinine Equation (2021)    Anion gap 11 5 - 15    Comment: Performed at [facility], [address] [place], [place] [address]  CBC with Differential     Status: Abnormal   Collection Time: 09/15/23 12:45 AM  Result Value Ref Range   WBC 11.0 (H) 4.0 - 10.5 K/uL   RBC 3.15  (L) 3.87 - 5.11 MIL/uL   Hemoglobin 7.7 (L) 12.0 - 15.0 g/dL    Comment: Reticulocyte Hemoglobin testing may be clinically indicated, consider ordering this additional test [id number]    HCT 23.6 (L) 36.0 - 46.0 %   MCV 74.9 (L) 80.0 - 100.0 fL   MCH 24.4 (L) 26.0 - 34.0 pg   MCHC 32.6 30.0 - 36.0 g/dL   RDW 01.0 (H) 27.2 - 53.6 %   Platelets 211 150 - 400 K/uL   nRBC 0.0 0.0 - 0.2 %   Neutrophils Relative % 79 %   Neutro Abs 8.9 (H) 1.7 - 7.7 K/uL   Lymphocytes Relative 15 %   Lymphs Abs 1.8 0.7 - 4.0 K/uL   Monocytes Relative 5 %   Monocytes Absolute 0.5 0.1 - 1.0 K/uL   Eosinophils Relative 0 %   Eosinophils Absolute 0.0 0.0 - 0.5 K/uL   Basophils Relative 0 %   Basophils Absolute 0.0 0.0 - 0.1 K/uL   Immature Granulocytes 1 %   Abs Immature Granulocytes 0.11 (H) 0.00 - 0.07 K/uL    Comment: Performed at [facility], [address] [place], [place] [address]  Protime-INR     Status: Abnormal   Collection Time: 09/15/23 12:45 AM  Result Value Ref Range   Prothrombin Time 17.2 (H) 11.4 - 15.2 seconds   INR 1.4 (H) 0.8 - 1.2    Comment: (NOTE) INR goal varies based on device and disease states. Performed at [facility], [address] [place], [place] [address]   APTT     Status: Abnormal   Collection Time: 09/15/23 12:45 AM  Result Value Ref Range   aPTT 39 (H) 24 - 36 seconds    Comment:        IF BASELINE aPTT IS ELEVATED, SUGGEST PATIENT RISK ASSESSMENT BE USED TO DETERMINE APPROPRIATE ANTICOAGULANT THERAPY. Performed at [facility], [address], [place], [place] [address]   Blood Culture (routine x 2)     Status: None (Preliminary result)   Collection Time: 09/15/23 12:45 AM   Specimen: BLOOD LEFT FOREARM  Result Value Ref Range   Specimen Description BLOOD LEFT FOREARM    Special Requests      BOTTLES DRAWN AEROBIC AND ANAEROBIC Blood Culture adequate volume Performed at [facility], [address] [place], [place] [address]    Culture PENDING    Report Status PENDING   Urinalysis, w/ Reflex to Culture (Infection Suspected) -Urine, Clean Catch     Status: Abnormal   Collection Time: 09/15/23 12:45 AM  Result Value Ref Range   Specimen Source URINE, CLEAN CATCH    Color, Urine AMBER (A) YELLOW    Comment: BIOCHEMICALS MAY BE AFFECTED BY COLOR   APPearance CLEAR CLEAR   Specific Gravity, Urine 1.026 1.005 - 1.030   pH 5.0 5.0 - 8.0   Glucose, UA NEGATIVE NEGATIVE mg/dL   Hgb urine dipstick SMALL (A) NEGATIVE   Bilirubin Urine NEGATIVE NEGATIVE   Ketones, ur NEGATIVE NEGATIVE mg/dL   Protein, ur 30 (A) NEGATIVE mg/dL   Nitrite NEGATIVE NEGATIVE   Leukocytes,Ua TRACE (A) NEGATIVE   RBC / HPF 0-5 0 - 5 RBC/hpf   WBC, UA 6-10 0 - 5 WBC/hpf    Comment:        Reflex urine culture not performed if WBC <=10, OR if Squamous epithelial cells >5. If Squamous epithelial cells >5 suggest recollection.    Bacteria, UA NONE SEEN NONE SEEN   Squamous Epithelial / HPF 0-5 0 - 5 /HPF   Mucus PRESENT     Comment: Performed at [facility], [address], [place], [place] [address]  Troponin I (High Sensitivity)     Status: None   Collection Time: 09/15/23 12:45 AM  Result Value Ref Range   Troponin I (High Sensitivity) 5 <18 ng/L    Comment: (NOTE) Elevated high sensitivity troponin I (hsTnI) values and significant  changes across serial measurements may suggest ACS but many other  chronic and acute conditions are known to elevate hsTnI results.  Refer to the "Links" section for chest pain algorithms and additional  guidance. Performed at [facility], [address], [place], [place] [address]   Pregnancy, urine     Status: None   Collection Time: 09/15/23 12:45 AM  Result Value Ref Range   Preg Test, Ur NEGATIVE NEGATIVE    Comment:        THE SENSITIVITY OF THIS METHODOLOGY IS >25 mIU/mL. Performed at [facility], [address] [place], [place] [address]   Blood Culture (routine x 2)     Status: None (Preliminary result)   Collection Time: 09/15/23  1:03 AM   Specimen: BLOOD LEFT ARM  Result Value Ref Range   Specimen Description BLOOD LEFT ARM    Special Requests      BOTTLES DRAWN AEROBIC AND ANAEROBIC Blood Culture adequate volume Performed at [facility], [address] [place], [place] [address]    Culture PENDING    Report Status PENDING   Aerobic/Anaerobic Culture w Gram Stain (surgical/deep wound)     Status: None (Preliminary result)   Collection Time: 09/15/23  1:43 AM   Specimen: BLOOD LEFT FOREARM; Abscess  Result Value Ref Range   Specimen Description      BLOOD LEFT FOREARM Performed at [facility], [address] [place], [place] [address]    Special Requests      NONE Performed at [facility], [address] [place], [place] [address]    Gram Stain      FEW WBC PRESENT, PREDOMINANTLY MONONUCLEAR FEW GRAM POSITIVE COCCI IN PAIRS AND CHAINS FEW GRAM VARIABLE ROD Performed at [facility], [address] [place], [place] [address]  Culture PENDING    Report Status PENDING   [facility] screen  [facility]     Status: None   Collection Time: 09/15/23  1:43 AM  Result Value Ref Range   ABO/RH(D) A NEG    Antibody Screen NEG    Sample Expiration      09/18/2023,2359 Performed at [facility], [address] [place], [place] [address]   Lactic acid, plasma     Status: None   Collection Time: 09/15/23  1:43 AM  Result Value Ref Range   Lactic Acid, Venous 1.2 0.5 - 1.9 mmol/L    Comment: Performed at [facility], [address] [place], [place] [address]  Body fluid culture w Gram Stain     Status: None (Preliminary result)   Collection Time: 09/15/23  2:37 AM   Specimen: KNEE; Body Fluid  Result Value Ref Range   Specimen  Description      KNEE RIGHT Performed at [facility], [address], [place], [place] [address]    Special Requests      NONE Performed at [facility], [address], [place], [place] [address]    Gram Stain      FEW WBC PRESENT,BOTH PMN AND MONONUCLEAR RARE GRAM POSITIVE COCCI IN PAIRS Performed at [facility], [address], [place], [place] [address]    Culture PENDING    Report Status PENDING   Cell count + diff,  w/ cryst-synvl fld     Status: Abnormal   Collection Time: 09/15/23  2:37 AM  Result Value Ref Range   Color, Synovial RED (A) YELLOW   Appearance-Synovial CLOUDY (A) CLEAR   Crystals, Fluid NO CRYSTALS SEEN    WBC, Synovial 63,200 (H) 0 - 200 /cu mm   Neutrophil, Synovial 84 (H) 0 - 25 %   Lymphocytes-Synovial Fld 3 0 - 20 %   Monocyte-Macrophage-Synovial Fluid 8 (L) 50 - 90 %   Eosinophils-Synovial 5 (H) 0 - 1 %   Other Cells-SYN      Intra and/or extracellular organisms present, correlate with Microbiology.    Comment: Performed at [facility], [address] [place], [place] [address]  Troponin I (High Sensitivity)     Status: None   Collection Time: 09/15/23  2:44 AM  Result Value Ref Range   Troponin I (High Sensitivity) 5 <18 ng/L    Comment: (NOTE) Elevated high sensitivity troponin I (hsTnI) values and significant  changes across serial measurements may suggest ACS but many other  chronic and acute conditions are known to elevate hsTnI results.  Refer to the "Links" section for chest pain algorithms and additional  guidance. Performed at [facility], [address], [place], [place] [address]   Rapid urine drug screen (hospital performed)     Status: Abnormal   Collection Time: 09/15/23  5:40 AM  Result Value Ref Range   Opiates POSITIVE (A) NONE DETECTED   Cocaine POSITIVE (A) NONE DETECTED   Benzodiazepines NONE DETECTED NONE DETECTED   Amphetamines  POSITIVE (A) NONE DETECTED   Tetrahydrocannabinol POSITIVE (A) NONE DETECTED   Barbiturates NONE DETECTED NONE DETECTED    Comment: (NOTE) DRUG SCREEN FOR MEDICAL PURPOSES ONLY.  IF CONFIRMATION IS NEEDED FOR ANY PURPOSE, NOTIFY LAB WITHIN 5 DAYS.  LOWEST DETECTABLE LIMITS FOR URINE DRUG SCREEN Drug Class  Cutoff (ng/mL) Amphetamine and metabolites    1000 Barbiturate and metabolites    200 Benzodiazepine                 200 Opiates and metabolites        300 Cocaine and metabolites        300 THC                            50 Performed at [facility], [address], [place], [place] [address]   Type and screen [facility]     Status: None (Preliminary result)   Collection Time: 09/15/23  8:12 AM  Result Value Ref Range   ABO/RH(D) A NEG    Antibody Screen NEG    Sample Expiration 09/18/2023,2359    Unit Number [id number]    Blood Component Type RED CELLS,LR    Unit division 00    Status of Unit ALLOCATED    Transfusion Status OK TO TRANSFUSE    Crossmatch Result      Compatible Performed at [facility], [address], [place], [place] [address]    Unit Number [id number]    Blood Component Type RBC LR PHER1    Unit division 00    Status of Unit ALLOCATED    Transfusion Status OK TO TRANSFUSE    Crossmatch Result Compatible   Prepare RBC (crossmatch)     Status: None   Collection Time: 09/15/23  8:12 AM  Result Value Ref Range   Order Confirmation      ORDER PROCESSED BY BLOOD BANK Performed at [facility], [address] [place], [place] [address]   TSH     Status: None   Collection Time: 09/15/23  8:12 AM  Result Value Ref Range   TSH 1.366 0.350 - 4.500 uIU/mL    Comment: Performed by a 3rd Generation assay with a functional sensitivity of <=0.01 uIU/mL. Performed at [facility], [address] [place], [place] [address]    CT  FOREARM LEFT W CONTRAST  Result Date: 09/15/2023 CLINICAL DATA:  Soft tissue infection suspected, forearm, no prior imaging. History of polysubstance abuse. EXAM: CT OF THE UPPER LEFT EXTREMITY WITH CONTRAST TECHNIQUE: Multidetector CT imaging of the left forearm was performed according to the standard protocol following intravenous contrast administration. RADIATION DOSE REDUCTION: This exam was performed according to the departmental dose-optimization program which includes automated exposure control, adjustment of the mA and/or kV according to patient size and/or use of iterative reconstruction technique. CONTRAST:  50mL OMNIPAQUE IOHEXOL 300 MG/ML  SOLN COMPARISON:  None Available. FINDINGS: Bones/Joint/Cartilage No evidence of acute fracture, dislocation or osteomyelitis. No significant elbow or wrist joint effusions demonstrated. The joint spaces appear preserved. Ligaments Suboptimally assessed by CT. Muscles and Tendons No focal intramuscular fluid collection or abnormal enhancement identified. No significant tenosynovitis or tendon sheath air is seen. Soft tissues Soft tissue swelling and scattered air bubbles within the dorsal and radial aspect of the distal forearm. There is a small amount of surrounding ill-defined fluid, but no drainable collections are identified. No evidence of foreign body. No acute vascular findings are identified. IMPRESSION: 1. Soft tissue swelling and scattered air bubbles within the dorsal and radial aspect of the distal forearm consistent with cellulitis. No drainable collections identified. 2. No CT evidence of osteomyelitis or septic arthritis. Electronically Signed   By: Hilarie Fredrickson.D.  On: 09/15/2023 11:24   VAS [place] UPPER EXTREMITY VENOUS DUPLEX  Result Date: 09/15/2023 UPPER VENOUS STUDY  Patient Name:  CYNTORIA HETZEL Criscuolo  Date of Exam:   09/15/2023 Medical Rec #: [id number]      Accession #:    [id number] Date of Birth: April 17, 1996      Patient Gender: F Patient  Age:   2 years Exam Location:  [facility] Procedure:      VAS [place] UPPER EXTREMITY VENOUS DUPLEX Referring Phys: Eulah Pont SUNDIL --------------------------------------------------------------------------------  Indications: Filling defects w/CT Risk Factors: None identified. Limitations: Open wound. Comparison Study: 09/14/2024 - CT Extrem Up Entire Arm R W/CM                   IMPRESSION:                   1. Diffuse soft tissue thickening involving the right upper                   extremity with areas of nodular thickening in the skin. No                   abscess                   is seen.                   2. Filling defects in the subclavian, axillary, and basilic                   veins                   suspicious for deep venous thrombosis. Ultrasound is                   recommended for                   further evaluation.                   3. No acute osseous abnormality. Performing Technologist: Chanda Busing RVT  Examination Guidelines: A complete evaluation includes B-mode imaging, spectral Doppler, color Doppler, and power Doppler as needed of all accessible portions of each vessel. Bilateral testing is considered an integral part of a complete examination. Limited examinations for reoccurring indications may be performed as noted.  Right Findings: +----------+------------+---------+-----------+----------+-------+ RIGHT     CompressiblePhasicitySpontaneousPropertiesSummary +----------+------------+---------+-----------+----------+-------+ IJV           Full       Yes       Yes                      +----------+------------+---------+-----------+----------+-------+ Subclavian    None       No        No                Acute  +----------+------------+---------+-----------+----------+-------+ Axillary      None       No        No                Acute  +----------+------------+---------+-----------+----------+-------+ Brachial    Partial      No        No                 Acute  +----------+------------+---------+-----------+----------+-------+ Radial        Full                                          +----------+------------+---------+-----------+----------+-------+  Ulnar         Full                                          +----------+------------+---------+-----------+----------+-------+ Cephalic      None                                   Acute  +----------+------------+---------+-----------+----------+-------+ Basilic       None                                   Acute  +----------+------------+---------+-----------+----------+-------+  Left Findings: +----------+------------+---------+-----------+----------+-------+ LEFT      CompressiblePhasicitySpontaneousPropertiesSummary +----------+------------+---------+-----------+----------+-------+ Subclavian    Full       Yes       Yes                      +----------+------------+---------+-----------+----------+-------+  Summary:  Right: Findings consistent with acute deep vein thrombosis involving the right subclavian vein, right axillary vein and right brachial veins. Findings consistent with acute superficial vein thrombosis involving the right basilic vein and right cephalic vein.  Left: No evidence of thrombosis in the subclavian.  *See table(s) above for measurements and observations.     Preliminary    US Abdomen Complete  Result Date: 09/15/2023 CLINICAL DATA:  27 year old female with chest pain and shortness of breath. EXAM: ABDOMEN ULTRASOUND COMPLETE COMPARISON:  CTA chest 0342 hours today. FINDINGS: Gallbladder: Partially contracted. This appears to account for the appearance of the gallbladder wall on image 3. No sludge or stones identified. No sonographic Murphy sign elicited. Common bile duct: Diameter: Could not be visualized. Liver: No focal lesion identified. Within normal limits in parenchymal echogenicity. Portal vein is patent on color Doppler imaging with  normal direction of blood flow towards the liver. IVC: No abnormality visualized. Pancreas: Visualized portion unremarkable. Spleen: Splenomegaly. Estimated splenic volume 695 mL (normal splenic volume range 83 - 412 mL). No discrete splenic lesion. Right Kidney: Length: 11.4 cm. Echogenicity within normal limits. No mass or hydronephrosis visualized. Left Kidney: Length: 12.6 cm. Echogenicity within normal limits. No mass or hydronephrosis visualized. Abdominal aorta: No aneurysm visualized. Other findings: No free fluid identified. IMPRESSION: 1. Splenomegaly. 2. Partially contracted gallbladder. No evidence of cholelithiasis or acute cholecystitis. 3. Common bile duct could not be visualized but otherwise negative ultrasound appearance of the liver. Electronically Signed   By: Odessa Fleming M.D.   On: 09/15/2023 07:14   CT Extrem Up Entire Arm R W/CM  Result Date: 09/15/2023 CLINICAL DATA:  Soft tissue mass, forearm, concern for abscess. EXAM: CT OF THE UPPER RIGHT EXTREMITY WITH CONTRAST TECHNIQUE: Multidetector CT imaging of the upper right extremity was performed according to the standard protocol following intravenous contrast administration. RADIATION DOSE REDUCTION: This exam was performed according to the departmental dose-optimization program which includes automated exposure control, adjustment of the mA and/or kV according to patient size and/or use of iterative reconstruction technique. CONTRAST:  OMNIPAQUE IOHEXOL 350 MG/ML SOLN COMPARISON:  None Available. FINDINGS: Bones/Joint/Cartilage No acute fracture or dislocation is seen. No bony erosions or periosteal elevation is identified. No joint effusion is identified. Ligaments Suboptimally assessed by CT. Muscles and Tendons No intramuscular abscess or edema. Soft tissues Diffuse subcutaneous  edema is noted in the right upper extremity. No subcutaneous emphysema is seen. Focal subcutaneous nodular densities are noted in the dermis at the  antecubital space, mid forearm, and wrist. No abscess is seen. Filling defects are present in the basilic, axillary, and subclavian veins. There is suboptimal opacification of the radial and ulnar veins. No other acute abnormality is seen. IMPRESSION: 1. Diffuse soft tissue thickening involving the right upper extremity with areas of nodular thickening in the skin. No abscess is seen. 2. Filling defects in the subclavian, axillary, and basilic veins suspicious for deep venous thrombosis. Ultrasound is recommended for further evaluation. 3. No acute osseous abnormality. Critical Value/emergent results were called by telephone at the time of interpretation on 09/15/2023 at 4:53 am to provider [facility] , who verbally acknowledged these results. Electronically Signed   By: Thornell Sartorius M.D.   On: 09/15/2023 04:55   CT Angio Chest PE W and/or Wo Contrast  Result Date: 09/15/2023 CLINICAL DATA:  Pulmonary embolism suspected, high probability. Chest pain and shortness of breath. Family history of factor 5 Leiden. EXAM: CT ANGIOGRAPHY CHEST WITH CONTRAST TECHNIQUE: Multidetector CT imaging of the chest was performed using the standard protocol during bolus administration of intravenous contrast. Multiplanar CT image reconstructions and MIPs were obtained to evaluate the vascular anatomy. RADIATION DOSE REDUCTION: This exam was performed according to the departmental dose-optimization program which includes automated exposure control, adjustment of the mA and/or kV according to patient size and/or use of iterative reconstruction technique. CONTRAST:  OMNIPAQUE IOHEXOL 350 MG/ML SOLN COMPARISON:  None Available. FINDINGS: Cardiovascular: The heart is normal in size and there is no pericardial effusion. The aorta and pulmonary trunk are normal in caliber. No evidence of pulmonary embolism is seen. Mediastinum/Nodes: No mediastinal or hilar lymphadenopathy is seen. Prominent lymph nodes are noted in the right  axilla, likely reactive. There is a hypodense nodule in the left lobe of the thyroid gland measuring 1.7 cm. The trachea and esophagus are within normal limits. Lungs/Pleura: Central bronchial wall thickening is present bilaterally. Nodular opacities are noted in the right upper lobe measuring 1 cm, axial image 55 and 1 cm axial image 82. There is a 9 mm nodular opacity in the right lower lobe, axial image 109. 5 mm nodule is present in the left lower lobe, axial image 82. No effusion or pneumothorax is seen. Upper Abdomen: Hypervascular focus is present in the anterior right lobe of the liver, possible flash filling hemangioma. No acute abnormality is seen. Musculoskeletal: No acute osseous abnormality. Review of the MIP images confirms the above findings. IMPRESSION: 1. No evidence of pulmonary embolism. 2. Scattered nodular opacities in the lungs bilaterally measuring up to 1 cm, which may be infectious, inflammatory, or neoplastic. Non-contrast chest CT at 3-6 months is recommended. If the nodules are stable at time of repeat CT, then future CT at 18-24 months (from today's scan) is considered optional for low-risk patients, but is recommended for high-risk patients. This recommendation follows the consensus statement: Guidelines for Management of Incidental Pulmonary Nodules Detected on CT Images: From the Fleischner Society 2017; Radiology 2017; 284:228-243. 3. 1.7 cm left thyroid nodule. Nonemergent thyroid ultrasound is recommended. Electronically Signed   By: Thornell Sartorius M.D.   On: 09/15/2023 04:25   DG Chest Port 1 View  Result Date: 09/15/2023 CLINICAL DATA:  Possible sepsis. EXAM: PORTABLE CHEST 1 VIEW COMPARISON:  07/11/2014. FINDINGS: The heart size and mediastinal contours are within normal limits. No consolidation, effusion, or pneumothorax.  No acute osseous abnormality. IMPRESSION: No active disease. Electronically Signed   By: Thornell Sartorius M.D.   On: 09/15/2023 01:48   DG Knee Complete 4  Views Right  Result Date: 09/15/2023 CLINICAL DATA:  Questionable sepsis EXAM: RIGHT KNEE - COMPLETE 4+ VIEW COMPARISON:  None Available. FINDINGS: No evidence of fracture, dislocation, or joint effusion. No evidence of arthropathy or other focal bone abnormality. Soft tissues are unremarkable. IMPRESSION: Negative. Electronically Signed   By: Darliss Cheney M.D.   On: 09/15/2023 01:48   DG Elbow Complete Right  Result Date: 09/15/2023 CLINICAL DATA:  Possible sepsis. EXAM: RIGHT ELBOW - COMPLETE 3+ VIEW COMPARISON:  None Available. FINDINGS: There is no evidence of acute fracture, dislocation, or joint effusion. There is no evidence of arthropathy or other focal bone abnormality. Diffuse soft tissue swelling is seen. IMPRESSION: No acute osseous abnormality. Electronically Signed   By: Thornell Sartorius M.D.   On: 09/15/2023 01:48    Pending Labs Unresulted Labs (From admission, onward)     Start     Ordered   09/16/23 0500  CBC  Daily,   R      09/15/23 0508   09/16/23 0500  Comprehensive metabolic panel  Daily,   R      09/15/23 0532   09/15/23 1300  Hemoglobin and hematocrit, blood  3 times daily,   R (with TIMED occurrences)      09/15/23 0629   09/15/23 0634  HCV RNA quant rflx ultra or genotyp  Once,   R        09/15/23 0633   09/15/23 0621  RPR  (GC, Chlamydia, RPR Panel (PNL))  Once,   R        09/15/23 0620   09/15/23 0552  Antithrombin III  (Hypercoagulable Panel, Comprehensive (PNL))  Add-on,   AD        09/15/23 0551   09/15/23 0552  Protein C activity  (Hypercoagulable Panel, Comprehensive (PNL))  Add-on,   AD        09/15/23 0551   09/15/23 0552  Protein C, total  (Hypercoagulable Panel, Comprehensive (PNL))  Add-on,   AD        09/15/23 0551   09/15/23 0552  Protein S activity  (Hypercoagulable Panel, Comprehensive (PNL))  Add-on,   AD        09/15/23 0551   09/15/23 0552  Protein S, total  (Hypercoagulable Panel, Comprehensive (PNL))  Add-on,   AD        09/15/23 0551    09/15/23 0552  Lupus anticoagulant panel  (Hypercoagulable Panel, Comprehensive (PNL))  Once,   R        09/15/23 0551   09/15/23 0552  Beta-2-glycoprotein i abs, IgG/M/A  (Hypercoagulable Panel, Comprehensive (PNL))  Add-on,   AD        09/15/23 0551   09/15/23 0552  Homocysteine, serum  (Hypercoagulable Panel, Comprehensive (PNL))  Add-on,   AD        09/15/23 0551   09/15/23 0552  Factor 5 leiden  (Hypercoagulable Panel, Comprehensive (PNL))  Add-on,   AD        09/15/23 0551   09/15/23 0552  Prothrombin gene mutation  (Hypercoagulable Panel, Comprehensive (PNL))  Add-on,   AD        09/15/23 0551   09/15/23 0552  Cardiolipin antibodies, IgG, IgM, IgA  (Hypercoagulable Panel, Comprehensive (PNL))  Once,   R        09/15/23 0551  09/15/23 0532  Hepatitis panel, acute  Once,   R        09/15/23 0532   09/15/23 0530  HIV Antibody (routine testing w rflx)  (HIV Antibody (Routine testing w reflex) panel)  Once,   R        09/15/23 0532   09/15/23 0237  Pathologist smear review  Once,   R        09/15/23 0237   09/15/23 0228  Glucose, Body Fluid Other  ([facility] ED BODY FLUID PANEL)  ONCE - STAT,   URGENT        09/15/23 0228            Vitals/Pain Today's Vitals   09/15/23 0545 09/15/23 0600 09/15/23 0645 09/15/23 1026  BP: (!) 106/49 (!) 113/49 129/62 (!) 96/47  Pulse: 87 80 86 71  Resp:   16 11  Temp:   98.5 F (36.9 C) 98.4 F (36.9 C)  TempSrc:    Oral  SpO2: 98% 98% 99% 98%  PainSc:        Isolation Precautions No active isolations  Medications Medications  lactated ringers bolus 1,000 mL (0 mLs Intravenous Stopped 09/15/23 0227)    And  lactated ringers bolus 500 mL (0 mLs Intravenous Stopped 09/15/23 0227)    And  lactated ringers bolus 250 mL (250 mLs Intravenous Not Given 09/15/23 0201)  vancomycin (VANCOREADY) IVPB 750 mg/150 mL (has no administration in time range)  ferrous sulfate tablet 325 mg (325 mg Oral Given 09/15/23 0916)  sodium chloride flush (NS)  0.9 % injection 3 mL (has no administration in time range)  sodium chloride flush (NS) 0.9 % injection 3 mL (has no administration in time range)  sodium chloride flush (NS) 0.9 % injection 3 mL (has no administration in time range)  0.9 %  sodium chloride infusion (has no administration in time range)  acetaminophen (TYLENOL) tablet 650 mg (has no administration in time range)    Or  acetaminophen (TYLENOL) suppository 650 mg (has no administration in time range)  senna-docusate (Senokot-S) tablet 1 tablet (has no administration in time range)  ondansetron (ZOFRAN) tablet 4 mg (has no administration in time range)    Or  ondansetron (ZOFRAN) injection 4 mg (has no administration in time range)  0.9 %  sodium chloride infusion (Manually program via Guardrails IV Fluids) (has no administration in time range)  lactated ringers infusion ( Intravenous New Bag/Given 09/15/23 0848)  nicotine (NICODERM CQ - dosed in mg/24 hours) patch 21 mg (21 mg Transdermal Patch Applied 09/15/23 0656)  ketorolac (TORADOL) 15 MG/ML injection 15 mg (15 mg Intravenous Given 09/15/23 0917)  cefTRIAXone (ROCEPHIN) 2 g in sodium chloride 0.9 % 100 mL IVPB (has no administration in time range)  HYDROmorphone (DILAUDID) injection 1 mg (1 mg Intravenous Given 09/15/23 0126)  meropenem (MERREM) 1 g in sodium chloride 0.9 % 100 mL IVPB (0 g Intravenous Stopped 09/15/23 0149)  lidocaine (XYLOCAINE) 2 % (with pres) injection 200 mg (200 mg Other Given 09/15/23 0213)  iohexol (OMNIPAQUE) 350 MG/ML injection 100 mL (100 mLs Intravenous Contrast Given 09/15/23 0336)  heparin bolus via infusion 3,000 Units (3,000 Units Intravenous Bolus from Bag 09/15/23 0515)  vancomycin (VANCOCIN) IVPB 1000 mg/200 mL premix (0 mg Intravenous Stopped 09/15/23 0849)  iohexol (OMNIPAQUE) 300 MG/ML solution 75 mL (50 mLs Intravenous Contrast Given 09/15/23 1056)    Mobility walks

## 2023-09-15 NOTE — Plan of Care (Signed)
  Problem: Nutrition: Goal: Adequate nutrition will be maintained Outcome: Progressing   Problem: Coping: Goal: Level of anxiety will decrease Outcome: Progressing   Problem: Pain Management: Goal: General experience of comfort will improve Outcome: Progressing

## 2023-09-15 NOTE — Op Note (Signed)
09/15/2023  4:38 PM  PATIENT:  Laurie [name]    PRE-OPERATIVE DIAGNOSIS:  Right knee septic arthritis  POST-OPERATIVE DIAGNOSIS:  Same  PROCEDURE: Right knee arthrotomy with irrigation debridement of the knee joint, partial synovectomy  SURGEON:  Faraaz Wolin A Romey Cohea, MD  PHYSICIAN ASSISTANT: None  ANESTHESIA:   General  PREOPERATIVE INDICATIONS:  Laurie [name] is a  27 y.o. female with a history of IV drug use presents with 1 day of acute right knee pain and swelling.  Aspiration by emergency room was concerning for septic osteoarthritis with a nucleated cell count of over 60,000 and gram-positive cocci on the Gram stain.  Also blood cultures positive with gram-positive cocci.  Given the risk of damage to the knee joint and cartilage from a persistent infection as well as poor response from septic arthritis to antibiotics alone recommended surgical irrigation debridement and partial synovectomy.  The risks benefits and alternatives were discussed with the patient preoperatively including but not limited to the risks of infection, bleeding, nerve injury, cardiopulmonary complications, the need for revision surgery, among others, and the patient was willing to proceed.  ESTIMATED BLOOD LOSS: 50cc  OPERATIVE IMPLANTS: none  OPERATIVE FINDINGS: Homero Fellers pus inside the right knee joint purulence in the suprapatellar synovium thoroughly debrided and washed.  OPERATIVE PROCEDURE:   Once adequate anesthesia was induced, the patient was positioned supine with a right thigh tourniquet placed.  The right lower extremity was prepped and draped in sterile fashion.  The patient actively being treated with broad-spectrum antibiotics which were continued.  A time-out was performed identifying the patient, planned procedure, and the appropriate extremity.     The leg was elevated and tourniquet elevated to 250 mmHg.  A 3 inch longitudinal incision was made just medial to the patella.  A small median  parapatellar arthrotomy was performed.  Frank purulent fluid was appreciated upon entry of the knee joint.  Some of this fluid was collected and sent for culture.  The remainder of the knee was then evaluated and irrigated with pulse lavage normal saline.  We then assessed the synovium which was felt to be also purulent.  The suprapatellar synovial pouch was resected down to healthy benign looking tissue.  The cartilage itself appeared to be healthy and benign.  1 g of vancomycin powder was placed into the knee.  A medium Hemovac drain was also placed into the knee exiting in the superolateral aspect of the knee.  The tourniquet had been let down.  No significant hemostasis was required.  The medial parapatellar arthrotomy was then reapproximated using #1 PDS.  The remaining wound was closed with 2-0 moncryl and 3-0 nylon. The knee was cleaned, dried, dressed sterilely using mepliex dressing and wrapped with cast padding and an ace wrap.  The patient was then brought to recovery room in stable condition, tolerating the procedure  well. There were no complications.  Post op recs: WB: WBAT Abx: Continue antibiotics per infectious disease Imaging: None Dressing: keep intact until follow up, document drain output every 12 hours.  Plan to remove drain once output less than 30 cc per shift likely after about 48 hours. DVT prophylaxis: Okay to resume prophylaxis anticoagulation postop day 1, would not start full dose anticoagulation for at least 24 hours pending drain output. Follow up: 2 weeks after surgery for a wound check with Dr. Blanchie Dessert at [facility].  Address: [address], [place], [place] [address]  Office Phone: [phone]  Reuel Boom  Blanchie Dessert, MD Orthopaedic Surgery

## 2023-09-15 NOTE — Hospital Course (Addendum)
27 y.o.f w/ IVDA and polysubstance use-including cocaine, amphetamine,fentanyl iv, factor V Leiden deficiency in family, chronic smoking/tobacco use, hepatitis C-untreated, PTSD, substance-induced mood disorder/chronic anxiety and depression elevated not feeling well, having central chest pain pressor/feeling tired, heavy., bilateral knee joint pain  x 5 days and multiple abscess in her arm.  Having subjective fever and not able to bear weight on right knee x 2 days.  In ED: Workup showed evidence of sepsis with leukocytosis, hypotension, tachycardia w/ anemia hemoglobin 7.7 baseline 7-8 RVP panel> negative. Labs> transaminitis with elevated AST 127, alkaline phosphatase 135 and elevated bilirubin 1.7 Elevated pro time and INR. UA amber appearance, hemoglobin small, protein 30 and trace leukocyte esterase, no bacteria. Negative pregnancy test. Troponin 5 wnl CXR, X-ray right kneE, X-ray right elbow>> no acute abnormalities CT extremity of the right arm>>Diffuse soft tissue thickening involving the right upper extremity with areas of nodular thickening in the skin. No abscess is seen. Filling defects in the subclavian, axillary, and basilic veins suspicious for deep venous thrombosis. CTA chest>> No P,E Scattered nodular opacities in the lungs bilaterally measuring up to 1 cm, which may be infectious, inflammatory, or neoplastic. Non-contrast chest CT at 3-6 months is recommended. 1.7 cm left thyroid nodule. Nonemergent thyroid ultrasound is recommended. S/P RT knee aspiration>> 60K WBC SEEN, w/ deep leukocoria , Orthopedcs Dr Eulah Pont consulted for washout Blood culture sentIn the ED right knee joint aspiration has been done by Dr. Madilyn Hook and fluid has been sent to cell count, Gram stain and culture. Blood culture sent, heparin drip started patient admitted for right knee septic arthritis, bilateral forearm abscess/cellulitis < right upper extremity DVT septic lung emboli transaminitis, placed on IV  meropenem (history of allergy to Keflex and penicillin).

## 2023-09-15 NOTE — ED Notes (Signed)
Provider ok with patient eating a sandwhich at this time. Patient stated she has not ate in 3 days

## 2023-09-15 NOTE — ED Notes (Signed)
Patient bed linens changed due to being soiled with urine.

## 2023-09-15 NOTE — Anesthesia Procedure Notes (Signed)
Procedure Name: LMA Insertion Date/Time: 09/15/2023 3:59 PM  Performed by: Deri Fuelling, CRNAPre-anesthesia Checklist: Patient identified, Emergency Drugs available, Suction available and Patient being monitored Patient Re-evaluated:Patient Re-evaluated prior to induction Oxygen Delivery Method: Circle system utilized Preoxygenation: Pre-oxygenation with 100% oxygen Induction Type: IV induction Ventilation: Mask ventilation without difficulty LMA: LMA flexible inserted LMA Size: 4.0 Tube type: Oral Number of attempts: 1 Airway Equipment and Method: Stylet and Oral airway Placement Confirmation: ETT inserted through vocal cords under direct vision, positive ETCO2 and breath sounds checked- equal and bilateral Tube secured with: Tape Dental Injury: Teeth and Oropharynx as per pre-operative assessment

## 2023-09-15 NOTE — Progress Notes (Signed)
       Overnight   NAME: Laurie [name] MRN: [id number] DOB : July 16, 1996    Date of Service   09/15/2023   HPI/Events of Note    Notified by RN staff for family member producing a vape for patient. Staff advised that this is not allowed on [facility] property.  At some point prior to bedside visit staff notified Hospital Security/[employer] in-house Officer.  On arrival to bedside patient had decided to stay and vape was removed. Family member was escorted out by Security/[employer] without incident..     Interventions/ Plan   Continue all previous orders  Reinforce smoking cessation      Chinita Greenland BSN MSNA MSN ACNPC-AG Acute Care Nurse Practitioner [facility]

## 2023-09-15 NOTE — Consult Note (Cosign Needed Addendum)
[facility] Face-to-Face Psychiatry Consult   Reason for Consult: Suboxone induction/opiate use disorder Referring Physician: Dr. Jonathon Bellows Patient Identification: Laurie [name] MRN:  [id number] Principal Diagnosis: Sepsis [facility]) Diagnosis:  Principal Problem:   Sepsis (HCC) Active Problems:   Abscess of upper extremity   Septic arthritis of knee, right (HCC)   Multiple lung nodules-secondary to septic emboli   Acute deep vein thrombosis (DVT) of axillary vein of right upper extremity (HCC)   Microcytic anemia   Transaminitis   IVDU (intravenous drug user)   Thyroid nodule   Tobacco dependence due to cigarettes   Total Time spent with patient: 1 hour  Subjective:   Laurie [name] is a 27 y.o. female patient admitted with cellulitis, septic arthritis, and septic emboli (CT confirmed).Patient has history of IVDU.  She has a 93 year old son who is with her mother. She is unemployed at this time. She report psychiatric history of depression and anxiety that was previously managed with Wellbutrin. Patient reports using about 1gr of fentanyl up to two times a day, when unable to get fentanyl she uses heroin. She reports intermittent episodes of methamphetamine use and no knowing intention of xylazine use. Psych consulted for assistance with opiate use disorder and suboxone induction.   The patient conveys baseline.  No consumption equivalent to 1 g up to 2 times daily, and conveys that she has been consuming fentanyl at this typical daily frequency for greater than 3 to 4 months.  Her preferred method is snorting and or smoking, however recently used IV a few weeks ago.  She reports longest period of sobriety 1 month in July 2024 in which she was incarcerated.  Denies any recent life stressors that result in baseline daily consumption.  Specifically over the last month, the patient notes that she has been depressed and endorsing symptoms of poor sleep, guilty, worthlessness, decreased concentration, sadness,  emotional.  However she denies any suicidal ideations and or recurrent thoughts of death.  She believes that her most recent fentanyl consumption occurred sometime on November 24 at or around 8 PM.  She acknowledges occasional use of marijuana, cocaine, methamphetamine, and or heroin when unable to access fentanyl.  Acknowledges that she has no previous alcohol use history.  She denies any history of fentanyl withdrawal complications.  Specifically when assessing for XYlazine use she denies, however states it may be possible.  At present she denies any chest pain, shortness of breath, cough, nausea, vomiting, diarrhea, abdominal pain, and or muscle aches.  In regards to the second half of a psychiatric consult for suboxone consideration.  Readiness to change screening completed. Patient suspects having a positive experience and better outcomes for success with methadone, however understands follow up and placement can be challenging.  Patient is unable to communicate effectively her goals for change, but she is wishing to start immediately as she does not wish to return to use of fentanyl at this time.  She does understand it will require ongoing monitoring to identify relapse, barriers, and boundaries.  She is also open to inpatient rehab, as part of her plan to progress for change.  After careful discussion patient appears to be in the action stage of change, and is ready to change her behavior for a long-term outcome however she does seem skeptical when addressing inpatient rehab.   She does appear to be competent with making changes at this time, and initiating suboxone during her hospitalization.  She  is finally able to verbalize that she  no longer has intent to return to use of illicit substances upon discharge.   HPI:  Laurie [name] is a 27 y.o. female who presents to the Emergency Department complaining of sick.  She presents to the emergency for feeling sick.  She states that she is on her arms 3 weeks  ago and they started popping over the last few days.  5 days ago she started feeling sick and unwell with central chest pain and feeling tight and heavy with difficulty breathing.  She does not have a cough.  She reports 2 days of subjective fevers.  She reports bilateral knee pain over the last day but now she is unable to bear weight on her right knee secondary to severe pain.   Past Psychiatric History: Depression and anxiety. Previous medications included Wellbutrin. SHe reports ongoing use of illicit substances for the past 10 years. She denies history of NSSIB, suicide attempts, suicidal ideations and or suicidal thoughts. She recently incarcerated in July 2024. She endorses history of illicit substances and 1ppd cigarette since age 25.   Risk to [place]:   Denies Risk to Others:   Denies Prior Inpatient Therapy:   Denies admission. Reports history of inpatient rehab admission in 2018 for methamphetamines.  Prior Outpatient Therapy:  Denies current has been to [facility] and [facility] .   Past Medical History:  Past Medical History:  Diagnosis Date   Anxiety    Asthma    "grew out"   Chlamydia    Depression    doing really good   Gestational diabetes    with first two pregnancies   Hepatitis C 03/2015   History of heroin abuse (HCC)    Homelessness    Infection    UTI   Polysubstance abuse (HCC) 05/28/2018   UDS positive for cocaine, MJ, meth, opioids   Type A blood, Rh negative     Past Surgical History:  Procedure Laterality Date   CESAREAN SECTION N/A 01/11/2016   Procedure: CESAREAN SECTION;  Surgeon: Brock Bad, MD;  Location: [facility];  Service: Obstetrics;  Laterality: N/A;   CESAREAN SECTION N/A 12/21/2016   Procedure: REPEAT CESAREAN SECTION;  Surgeon: Adam Phenix, MD;  [facility];  Service: Obstetrics;  Laterality: N/A;   CESAREAN SECTION N/A 06/05/2018   Procedure: REPEAT CESAREAN SECTION;  Surgeon: [name], MD;  Location: [facility] BIRTHING SUIT[facility];   Service: Obstetrics;  Laterality: N/A;   CESAREAN SECTION WITH BILATERAL TUBAL LIGATION Bilateral 03/08/2020   Procedure: CESAREAN SECTION WITH BILATERAL TUBAL LIGATION;  Surgeon: Kathrynn Running, MD;  Location: [facility] ORS;  Service: Obstetrics;  Laterality: Bilateral;   Family History:  Family History  Problem Relation Age of Onset   Diabetes Mother    Pulmonary embolism Mother    Stroke Mother    Cancer Paternal Grandmother        liver   Epilepsy Brother    Family Psychiatric  History: Denies Social History:  Social History   Substance and Sexual Activity  Alcohol Use Not Currently   Comment: occasionally     Social History   Substance and Sexual Activity  Drug Use Not Currently   Types: Marijuana, Heroin, Other-see comments   Comment: 05/28/18 +UDS for meth, cocaine, opioids, MJ;  heroin 3 years ago    Social History   Socioeconomic History   Marital status: Single    Spouse name: Not on file   Number of children: 2   Years  of education: Not on file   Highest education level: Not on file  Occupational History   Not on file  Tobacco Use   Smoking status: Some Days    Current packs/day: 0.25    Types: Cigarettes   Smokeless tobacco: Never   Tobacco comments:    4 cigs per day  Vaping Use   Vaping status: Never Used  Substance and Sexual Activity   Alcohol use: Not Currently    Comment: occasionally   Drug use: Not Currently    Types: Marijuana, Heroin, Other-see comments    Comment: 05/28/18 +UDS for meth, cocaine, opioids, MJ;  heroin 3 years ago   Sexual activity: Yes    Birth control/protection: None  Other Topics Concern   Not on file  Social History Narrative   Not on file   Social Determinants of Health   Financial Resource Strain: Low Risk  (06/04/2018)   Overall Financial Resource Strain (CARDIA)    Difficulty of Paying Living Expenses: Not hard at all  Food Insecurity: Food Insecurity Present (09/15/2023)   Hunger Vital Sign    Worried About  Running Out of Food in the Last Year: Sometimes true    Ran Out of Food in the Last Year: Sometimes true  Transportation Needs: Unmet Transportation Needs (09/15/2023)   PRAPARE - Administrator, Civil Service (Medical): Yes    Lack of Transportation (Non-Medical): Yes  Physical Activity: Not on file  Stress: Not on file  Social Connections: Unknown (03/05/2022)   [facility]   Social Network    Social Network: Not on file   Additional Social History:    Allergies:   Allergies  Allergen Reactions   Keflex [Cephalexin] Hives   Penicillins Other (See Comments)    Results thru an allergy skin test Has patient had a PCN reaction causing immediate rash, facial/tongue/throat swelling, SOB or lightheadedness with hypotension: No Has patient had a PCN reaction causing severe rash involving mucus membranes or skin necrosis: No Has patient had a PCN reaction that required hospitalization No Has patient had a PCN reaction occurring within the last 10 years: No If all of the above answers are "NO", then may proceed with Cephalosporin use.      Labs:  Results for orders placed or performed during the hospital encounter of 09/14/23 (from the past 48 hour(s))  Resp panel by RT-PCR (RSV, Flu A&B, Covid) Urine, Clean Catch     Status: None   Collection Time: 09/15/23 12:45 AM   Specimen: Urine, Clean Catch; Nasal Swab  Result Value Ref Range   SARS Coronavirus 2 by RT PCR NEGATIVE NEGATIVE    Comment: (NOTE) SARS-CoV-2 target nucleic acids are NOT DETECTED.  The SARS-CoV-2 RNA is generally detectable in upper respiratory specimens during the acute phase of infection. The lowest concentration of SARS-CoV-2 viral copies this assay can detect is 138 copies/mL. A negative result does not preclude SARS-Cov-2 infection and should not be used as the sole basis for treatment or other patient management decisions. A negative result may occur with   improper specimen collection/handling, submission of specimen other than nasopharyngeal swab, presence of viral mutation(s) within the areas targeted by this assay, and inadequate number of viral copies(<138 copies/mL). A negative result must be combined with clinical observations, patient history, and epidemiological information. The expected result is Negative.  Fact Sheet for Patients:  BloggerCourse.com  Fact Sheet for Healthcare Providers:  SeriousBroker.it  This test is no t  yet approved or cleared by the [place] and  has been authorized for detection and/or diagnosis of SARS-CoV-2 by FDA under an Emergency Use Authorization (EUA). This EUA will remain  in effect (meaning this test can be used) for the duration of the COVID-19 declaration under Section 564(b)(1) of the Act, 21 U.S.C.section 360bbb-3(b)(1), unless the authorization is terminated  or revoked sooner.       Influenza A by PCR NEGATIVE NEGATIVE   Influenza B by PCR NEGATIVE NEGATIVE    Comment: (NOTE) The Xpert Xpress SARS-CoV-2/FLU/RSV plus assay is intended as an aid in the diagnosis of influenza from Nasopharyngeal swab specimens and should not be used as a sole basis for treatment. Nasal washings and aspirates are unacceptable for Xpert Xpress SARS-CoV-2/FLU/RSV testing.  Fact Sheet for Patients: BloggerCourse.com  Fact Sheet for Healthcare Providers: SeriousBroker.it  This test is not yet approved or cleared by the [place] FDA and has been authorized for detection and/or diagnosis of SARS-CoV-2 by FDA under an Emergency Use Authorization (EUA). This EUA will remain in effect (meaning this test can be used) for the duration of the COVID-19 declaration under Section 564(b)(1) of the Act, 21 U.S.C. section 360bbb-3(b)(1), unless the authorization is terminated or revoked.     Resp  Syncytial Virus by PCR NEGATIVE NEGATIVE    Comment: (NOTE) Fact Sheet for Patients: BloggerCourse.com  Fact Sheet for Healthcare Providers: SeriousBroker.it  This test is not yet approved or cleared by the [place] FDA and has been authorized for detection and/or diagnosis of SARS-CoV-2 by FDA under an Emergency Use Authorization (EUA). This EUA will remain in effect (meaning this test can be used) for the duration of the COVID-19 declaration under Section 564(b)(1) of the Act, 21 U.S.C. section 360bbb-3(b)(1), unless the authorization is terminated or revoked.  Performed at [facility] : [facility], [address] [place], [place] [address]   Comprehensive metabolic panel     Status: Abnormal   Collection Time: 09/15/23 12:45 AM  Result Value Ref Range   Sodium 135 135 - 145 mmol/L   Potassium 3.5 3.5 - 5.1 mmol/L   Chloride 102 98 - 111 mmol/L   CO2 22 22 - 32 mmol/L   Glucose, Bld 123 (H) 70 - 99 mg/dL    Comment: Glucose reference range applies only to samples taken after fasting for at least 8 hours.   BUN 19 6 - 20 mg/dL   Creatinine, Ser 1.30 0.44 - 1.00 mg/dL   Calcium 7.7 (L) 8.9 - 10.3 mg/dL   Total Protein 7.0 6.5 - 8.1 g/dL   Albumin 2.5 (L) 3.5 - 5.0 g/dL   AST 34 15 - 41 U/L   ALT 127 (H) 0 - 44 U/L   Alkaline Phosphatase 135 (H) 38 - 126 U/L   Total Bilirubin 1.7 (H) <1.2 mg/dL   GFR, Estimated >86 >57 mL/min    Comment: (NOTE) Calculated using the CKD-EPI Creatinine Equation (2021)    Anion gap 11 5 - 15    Comment: Performed at [facility], [address] [place] [address] [place], [place] [address]  CBC with Differential     Status: Abnormal   Collection Time: 09/15/23 12:45 AM  Result Value Ref Range   WBC 11.0 (H) 4.0 - 10.5 K/uL   RBC 3.15 (L) 3.87 - 5.11 MIL/uL   Hemoglobin 7.7 (L) 12.0 - 15.0 g/dL    Comment: Reticulocyte Hemoglobin testing may be clinically indicated, consider  ordering this additional test [id number]  HCT 23.6 (L) 36.0 - 46.0 %   MCV 74.9 (L) 80.0 - 100.0 fL   MCH 24.4 (L) 26.0 - 34.0 pg   MCHC 32.6 30.0 - 36.0 g/dL   RDW 21.3 (H) 08.6 - 57.8 %   Platelets 211 150 - 400 K/uL   nRBC 0.0 0.0 - 0.2 %   Neutrophils Relative % 79 %   Neutro Abs 8.9 (H) 1.7 - 7.7 K/uL   Lymphocytes Relative 15 %   Lymphs Abs 1.8 0.7 - 4.0 K/uL   Monocytes Relative 5 %   Monocytes Absolute 0.5 0.1 - 1.0 K/uL   Eosinophils Relative 0 %   Eosinophils Absolute 0.0 0.0 - 0.5 K/uL   Basophils Relative 0 %   Basophils Absolute 0.0 0.0 - 0.1 K/uL   Immature Granulocytes 1 %   Abs Immature Granulocytes 0.11 (H) 0.00 - 0.07 K/uL    Comment: Performed at [facility], [address], [place], [place] [address]  Protime-INR     Status: Abnormal   Collection Time: 09/15/23 12:45 AM  Result Value Ref Range   Prothrombin Time 17.2 (H) 11.4 - 15.2 seconds   INR 1.4 (H) 0.8 - 1.2    Comment: (NOTE) INR goal varies based on device and disease states. Performed at [facility], [address] [place], [place] [address]   APTT     Status: Abnormal   Collection Time: 09/15/23 12:45 AM  Result Value Ref Range   aPTT 39 (H) 24 - 36 seconds    Comment:        IF BASELINE aPTT IS ELEVATED, SUGGEST PATIENT RISK ASSESSMENT BE USED TO DETERMINE APPROPRIATE ANTICOAGULANT THERAPY. Performed at [facility], [address] [place], [place] [address]   Blood Culture (routine x 2)     Status: None (Preliminary result)   Collection Time: 09/15/23 12:45 AM   Specimen: BLOOD LEFT FOREARM  Result Value Ref Range   Specimen Description BLOOD LEFT FOREARM    Special Requests      BOTTLES DRAWN AEROBIC AND ANAEROBIC Blood Culture adequate volume Performed at [facility], [address], [place], [place] [address]    Culture PENDING    Report Status PENDING   Urinalysis, w/ Reflex to Culture (Infection Suspected)  -Urine, Clean Catch     Status: Abnormal   Collection Time: 09/15/23 12:45 AM  Result Value Ref Range   Specimen Source URINE, CLEAN CATCH    Color, Urine AMBER (A) YELLOW    Comment: BIOCHEMICALS MAY BE AFFECTED BY COLOR   APPearance CLEAR CLEAR   Specific Gravity, Urine 1.026 1.005 - 1.030   pH 5.0 5.0 - 8.0   Glucose, UA NEGATIVE NEGATIVE mg/dL   Hgb urine dipstick SMALL (A) NEGATIVE   Bilirubin Urine NEGATIVE NEGATIVE   Ketones, ur NEGATIVE NEGATIVE mg/dL   Protein, ur 30 (A) NEGATIVE mg/dL   Nitrite NEGATIVE NEGATIVE   Leukocytes,Ua TRACE (A) NEGATIVE   RBC / HPF 0-5 0 - 5 RBC/hpf   WBC, UA 6-10 0 - 5 WBC/hpf    Comment:        Reflex urine culture not performed if WBC <=10, OR if Squamous epithelial cells >5. If Squamous epithelial cells >5 suggest recollection.    Bacteria, UA NONE SEEN NONE SEEN   Squamous Epithelial / HPF 0-5 0 - 5 /HPF   Mucus PRESENT     Comment: Performed at [facility], [address] [place], [place] [address]  Troponin I (High Sensitivity)     Status: None   Collection Time: 09/15/23 12:45 AM  Result Value Ref Range   Troponin I (High Sensitivity) 5 <18 ng/L    Comment: (NOTE) Elevated high sensitivity troponin I (hsTnI) values and significant  changes across serial measurements may suggest ACS but many other  chronic and acute conditions are known to elevate hsTnI results.  Refer to the "Links" section for chest pain algorithms and additional  guidance. Performed at [facility], [address], [place], [place] [address]   Pregnancy, urine     Status: None   Collection Time: 09/15/23 12:45 AM  Result Value Ref Range   Preg Test, Ur NEGATIVE NEGATIVE    Comment:        THE SENSITIVITY OF THIS METHODOLOGY IS >25 mIU/mL. Performed at [facility], [address], [place], [place] [address]   Blood Culture (routine x 2)     Status: None (Preliminary result)   Collection Time:  09/15/23  1:03 AM   Specimen: BLOOD LEFT ARM  Result Value Ref Range   Specimen Description BLOOD LEFT ARM    Special Requests      BOTTLES DRAWN AEROBIC AND ANAEROBIC Blood Culture adequate volume Performed at [facility], [address], [place], [place] [address]    Culture PENDING    Report Status PENDING   Aerobic/Anaerobic Culture w Gram Stain (surgical/deep wound)     Status: None (Preliminary result)   Collection Time: 09/15/23  1:43 AM   Specimen: BLOOD LEFT FOREARM; Abscess  Result Value Ref Range   Specimen Description      BLOOD LEFT FOREARM Performed at [facility], [address], [place], [place] [address]    Special Requests      NONE Performed at [facility], [address], [place], [place] [address]    Gram Stain      FEW WBC PRESENT, PREDOMINANTLY MONONUCLEAR FEW GRAM POSITIVE COCCI IN PAIRS AND CHAINS FEW GRAM VARIABLE ROD Performed at [facility], [address] [place], [place] [address]    Culture PENDING    Report Status PENDING   Type and screen [facility]OSPITAL     Status: None   Collection Time: 09/15/23  1:43 AM  Result Value Ref Range   ABO/RH(D) A NEG    Antibody Screen NEG    Sample Expiration      09/18/2023,2359 Performed at [facility], [address], [place], [place] [address]   Lactic acid, plasma     Status: None   Collection Time: 09/15/23  1:43 AM  Result Value Ref Range   Lactic Acid, Venous 1.2 0.5 - 1.9 mmol/L    Comment: Performed at [facility], [address], [place], [place] [address]  Body fluid culture w Gram Stain     Status: None (Preliminary result)   Collection Time: 09/15/23  2:37 AM   Specimen: KNEE; Body Fluid  Result Value Ref Range   Specimen Description      KNEE RIGHT Performed at [facility], [address], [place], [place] [address]    Special Requests      NONE Performed  at [facility], [address] [place], [place] [address]    Gram Stain      FEW WBC PRESENT,BOTH PMN AND MONONUCLEAR RARE GRAM POSITIVE COCCI IN  PAIRS Performed at [facility], [address], [place], [place] [address]    Culture PENDING    Report Status PENDING   Cell count + diff,  w/ cryst-synvl fld     Status: Abnormal   Collection Time: 09/15/23  2:37 AM  Result Value Ref Range   Color, Synovial RED (A) YELLOW   Appearance-Synovial CLOUDY (A) CLEAR   Crystals, Fluid NO CRYSTALS SEEN    WBC, Synovial 63,200 (H) 0 - 200 /cu mm   Neutrophil, Synovial 84 (H) 0 - 25 %   Lymphocytes-Synovial Fld 3 0 - 20 %   Monocyte-Macrophage-Synovial Fluid 8 (L) 50 - 90 %   Eosinophils-Synovial 5 (H) 0 - 1 %   Other Cells-SYN      Intra and/or extracellular organisms present, correlate with Microbiology.    Comment: Performed at [facility], [address] [place], [place] [address]  Troponin I (High Sensitivity)     Status: None   Collection Time: 09/15/23  2:44 AM  Result Value Ref Range   Troponin I (High Sensitivity) 5 <18 ng/L    Comment: (NOTE) Elevated high sensitivity troponin I (hsTnI) values and significant  changes across serial measurements may suggest ACS but many other  chronic and acute conditions are known to elevate hsTnI results.  Refer to the "Links" section for chest pain algorithms and additional  guidance. Performed at [facility], [address] [place], [place] [address]   Rapid urine drug screen (hospital performed)     Status: Abnormal   Collection Time: 09/15/23  5:40 AM  Result Value Ref Range   Opiates POSITIVE (A) NONE DETECTED   Cocaine POSITIVE (A) NONE DETECTED   Benzodiazepines NONE DETECTED NONE DETECTED   Amphetamines POSITIVE (A) NONE DETECTED   Tetrahydrocannabinol POSITIVE (A) NONE DETECTED   Barbiturates NONE DETECTED NONE DETECTED    Comment: (NOTE) DRUG SCREEN FOR MEDICAL  PURPOSES ONLY.  IF CONFIRMATION IS NEEDED FOR ANY PURPOSE, NOTIFY LAB WITHIN 5 DAYS.  LOWEST DETECTABLE LIMITS FOR URINE DRUG SCREEN Drug Class                     Cutoff (ng/mL) Amphetamine and metabolites    1000 Barbiturate and metabolites    200 Benzodiazepine                 200 Opiates and metabolites        300 Cocaine and metabolites        300 THC                            50 Performed at [facility], [address] [place], [place] [address]   HIV Antibody (routine testing w rflx)     Status: None   Collection Time: 09/15/23  8:12 AM  Result Value Ref Range   HIV Screen 4th Generation wRfx Non Reactive Non Reactive    Comment: Performed at [facility], [address] [place], [place] [address]  Hepatitis panel, acute     Status: None (Preliminary result)   Collection Time: 09/15/23  8:12 AM  Result Value Ref Range   Hepatitis B Surface Ag NON REACTIVE NON REACTIVE   HCV Ab PENDING NON REACTIVE   Hep A IgM NON REACTIVE NON REACTIVE   Hep B C IgM NON REACTIVE NON REACTIVE    Comment: Performed at [facility], [address], [place], [place] [address]  Type and screen [facility]     Status: None (Preliminary result)   Collection Time: 09/15/23  8:12 AM  Result Value Ref Range   ABO/RH(D) A NEG    Antibody Screen NEG    Sample Expiration 09/18/2023,2359    Unit Number [id number]    Blood Component Type RED CELLS,LR    Unit division 00    Status of Unit ALLOCATED    Transfusion Status OK TO TRANSFUSE    Crossmatch Result      Compatible Performed at [facility], [address] [place], [place] [address]    Unit Number [id number]    Blood Component Type RBC LR PHER1    Unit division 00    Status of Unit ALLOCATED    Transfusion Status OK TO TRANSFUSE    Crossmatch Result Compatible   Prepare RBC (crossmatch)     Status: None   Collection Time: 09/15/23  8:12 AM  Result Value  Ref Range   Order Confirmation      ORDER PROCESSED BY BLOOD BANK Performed at [facility], [address] [place], [place] [address]   TSH     Status: None   Collection Time: 09/15/23  8:12 AM  Result Value Ref Range   TSH 1.366 0.350 - 4.500 uIU/mL    Comment: Performed by a 3rd Generation assay with a functional sensitivity of <=0.01 uIU/mL. Performed at [facility], [address], [place], [place] [address]   Hemoglobin and hematocrit, blood     Status: Abnormal   Collection Time: 09/15/23  1:19 PM  Result Value Ref Range   Hemoglobin 7.2 (L) 12.0 - 15.0 g/dL   HCT 36.6 (L) 44.0 - 34.7 %    Comment: Performed at [facility], [address] [address], [place], [place] [address]    Current Facility-Administered Medications  Medication Dose Route Frequency Provider Last Rate Last Admin   [facility] Hold] 0.9 %  sodium chloride infusion (Manually program via Guardrails IV Fluids)   Intravenous Once Janalyn Shy, Subrina, MD       [facility] Hold] 0.9 %  sodium chloride infusion  250 mL Intravenous PRN Janalyn Shy, Subrina, MD       [facility] Hold] acetaminophen (TYLENOL) tablet 650 mg  650 mg Oral Q6H PRN Janalyn Shy, Subrina, MD   650 mg at 09/15/23 1208   Or   [MAR Hold] acetaminophen (TYLENOL) suppository 650 mg  650 mg Rectal Q6H PRN Janalyn Shy, Subrina, MD       [facility] Hold] cefTRIAXone (ROCEPHIN) 2 g in sodium chloride 0.9 % 100 mL IVPB  2 g Intravenous Q24H Judyann Munson, MD       chlorhexidine (HIBICLENS) 4 % liquid 4 Application  60 mL Topical Once Freeman Caldron, PA-C       chlorhexidine (PERIDEX) 0.12 % solution 15 mL  15 mL Mouth/Throat Once Trevor Iha, MD       Or   Oral care mouth rinse  15 mL Mouth Rinse Once Trevor Iha, MD       chlorhexidine (PERIDEX) 0.12 % solution 15 mL  15 mL Mouth/Throat Once Trevor Iha, MD       [MAR Hold] Chlorhexidine Gluconate Cloth 2 % PADS 6 each  6 each Topical Daily Lanae Boast, MD   6 each at  09/15/23 1325   [MAR Hold] ferrous sulfate tablet 325 mg  325 mg  Oral Q breakfast Janalyn Shy, Subrina, MD   325 mg at 09/15/23 0916   [MAR Hold] ketorolac (TORADOL) 15 MG/ML injection 15 mg  15 mg Intravenous Q6H PRN Lanae Boast, MD   15 mg at 09/15/23 0917   [MAR Hold] lactated ringers bolus 250 mL  250 mL Intravenous Once Sundil, Subrina, MD       lactated ringers infusion   Intravenous Continuous Janalyn Shy, Subrina, MD 125 mL/hr at 09/15/23 0848 New Bag at 09/15/23 0848   lactated ringers infusion   Intravenous Continuous Trevor Iha, MD       [START ON 09/16/2023] levofloxacin (LEVAQUIN) IVPB 500 mg  500 mg Intravenous On Call to OR Freeman Caldron, PA-C       [facility] Hold] nicotine (NICODERM CQ - dosed in mg/24 hours) patch 21 mg  21 mg Transdermal Daily Sundil, Subrina, MD   21 mg at 09/15/23 0656   [MAR Hold] ondansetron (ZOFRAN) tablet 4 mg  4 mg Oral Q6H PRN Tereasa Coop, MD       Or   [facility] Hold] ondansetron [facility]) injection 4 mg  4 mg Intravenous Q6H PRN Janalyn Shy, Subrina, MD       [facility] Hold] Oral care mouth rinse  15 mL Mouth Rinse 4 times per day Lanae Boast, MD       [facility] Hold] Oral care mouth rinse  15 mL Mouth Rinse PRN Kc, Ramesh, MD       povidone-iodine 10 % swab 2 Application  2 Application Topical Once Freeman Caldron, PA-C       [facility] Hold] senna-docusate (Senokot-S) tablet 1 tablet  1 tablet Oral QHS PRN Janalyn Shy, Subrina, MD       [facility] Hold] sodium chloride flush (NS) 0.9 % injection 3 mL  3 mL Intravenous Q12H Sundil, Subrina, MD       [facility] Hold] sodium chloride flush (NS) 0.9 % injection 3 mL  3 mL Intravenous Q12H Sundil, Subrina, MD       [facility] Hold] sodium chloride flush (NS) 0.9 % injection 3 mL  3 mL Intravenous PRN Sundil, Subrina, MD       tranexamic acid (CYKLOKAPRON) IVPB 1,000 mg  1,000 mg Intravenous To OR Freeman Caldron, PA-C       [facility] Hold] vancomycin (VANCOREADY) IVPB 750 mg/150 mL  750 mg Intravenous Q12H Poindexter, Sharion Balloon, RPH         Musculoskeletal: Strength & Muscle Tone: within normal limits Gait & Station: normal Patient leans: N/A            Psychiatric Specialty Exam:  Presentation  General Appearance:  Disheveled (appears older than age stated)  Eye Contact: Fair  Speech: Clear and Coherent; Normal Rate  Speech Volume: Decreased  Handedness: Right   Mood and Affect  Mood: Depressed; Anxious  Affect: Congruent   Thought Process  Thought Processes: Linear; Coherent; Goal Directed  Descriptions of Associations:Circumstantial  Orientation:Full (Time, Place and Person)  Thought Content:Logical  History of Schizophrenia/Schizoaffective disorder:No data recorded Duration of Psychotic Symptoms:No data recorded Hallucinations:Hallucinations: None  Ideas of Reference:None  Suicidal Thoughts:Suicidal Thoughts: No  Homicidal Thoughts:Homicidal Thoughts: No   Sensorium  Memory: Immediate Fair; Recent Fair  Judgment: Fair  Insight: Fair   Art therapist  Concentration: Good  Attention Span: Fair  Recall: Good  Fund of Knowledge: Good  Language: Fair   Psychomotor Activity  Psychomotor Activity: Psychomotor Activity: Normal   Assets  Assets: Communication Skills; Desire for Improvement; Housing; Physical Health; Resilience;  Social Support   Sleep  Sleep: Sleep: Poor Number of Hours of Sleep: 4   Physical Exam: Physical Exam Vitals and nursing note reviewed.  Constitutional:      General: She is awake.     Appearance: She is normal weight.  HENT:     Mouth/Throat:     Dentition: Abnormal dentition. Dental caries present.  Skin:    General: Skin is warm.     Findings: Abscess, ecchymosis, erythema, rash and wound (multiple, necrotic ulcers noted to L dorsal aspect of wrist) present.  Neurological:     General: No focal deficit present.     Mental Status: She is alert and oriented to person, place, and time.  Psychiatric:         Mood and Affect: Mood normal.        Behavior: Behavior normal. Behavior is cooperative.    Review of Systems  Psychiatric/Behavioral:  Positive for substance abuse (fentanyl, heroin, and meth).   All other systems reviewed and are negative.  Blood pressure (!) 158/72, pulse 73, temperature 98.4 F (36.9 C), temperature source Oral, resp. rate 18, last menstrual period 07/29/2023, SpO2 100%, unknown if currently breastfeeding. There is no height or weight on file to calculate BMI.  Treatment Plan Summary: Daily contact with patient to assess and evaluate symptoms and progress in treatment, Medication management, and Plan   Due to the complexity of illicit substance use to include suspect invalidating, fentanyl, heroin will require careful consideration for precipitated withdrawal and management of symptoms will require perfect timing.  It is important to start Suboxone only when patient is experiencing opiate withdrawal symptoms (agitation, sweating, muscle pain, nausea or vomiting or diarrhea).  Will suspect withdrawal symptoms beginning 12 to 24 hours after the last use.  We will start his Suboxone at this time, however patient may need to transition to methadone depending on length of stay due to extensive use of opiate medication daily.   Suspect Xylazine use clinical findings consistent with use (peripheral edema, necrotic ulcers, abscesses, skin tracks, and skin infections).  -Will recommend Clonidine detox, due not suspect her pressures will tolerate a Clonidine patch.  -Will start Suboxone treatment, see attending attestation for recommendations.  Will likely start at 8 mg due to higher opioid tolerance; would like to reduce precipitated withdrawal and chances of leaving AMA. -TOC referral for inpatient rehab, patient in action phase.  If inpatient rehab is not available, consider Suboxone outpatient follow-up, prior to discharge.  Once patient is more medically stable will address  underlying psychiatric conditions such as depression/anxiety and consider starting an antidepressant depending on length of stay.  As noted above primary goal is to treat opiate use disorder and prevent patient from leaving AMA due to high medical complexities.  There do not appear to be any barriers to discharge, and or acute psychiatric problems that warrant inpatient psychiatric admission at this time.  Labs reviewed and assessed to include urine drug screen positive for amphetamines, opiates, cocaine, THC.  White blood count 11.0 hemoglobin 7.2, hematocrit 23.2, elevated alkaline phosphatase 135, low albumin 2.5, elevated bilirubin 1.7, and elevated ALT 127.  CT scan shows scattered nodular opacities in the lungs bilaterally consistent with infectious, inflammatory, and or neoplastic.  Disposition: No evidence of imminent risk to Barto[place]ti or others at present.   Patient does not meet criteria for psychiatric inpatient admission. Supportive therapy provided about ongoing stressors. Refer to IOP. Discussed crisis plan, support from social network, calling 911, coming to the  Emergency Department, and calling Suicide Hotline. Open to inpatient rehab    Maryagnes Amos, FNP 09/15/2023 2:50 PM

## 2023-09-15 NOTE — ED Notes (Signed)
Called lab for patient rapid urine drug screen to be added on

## 2023-09-15 NOTE — Transfer of Care (Signed)
Immediate Anesthesia Transfer of Care Note  Patient: Laurie [name]  Procedure(s) Performed: Procedure(s): IRRIGATION AND DEBRIDEMENT KNEE (Right)  Patient Location: PACU  Anesthesia Type:General  Level of Consciousness:  sedated, patient cooperative and responds to stimulation  Airway & Oxygen Therapy:Patient Spontanous Breathing and Patient connected to face mask oxgen  Post-op Assessment:  Report given to PACU RN and Post -op Vital signs reviewed and stable  Post vital signs:  Reviewed and stable  Last Vitals:  Vitals:   09/15/23 1300 09/15/23 1454  BP: (!) 158/72 127/64  Pulse: 73 67  Resp: 18 19  Temp:  36.9 C  SpO2: 100% 100%    Complications: No apparent anesthesia complications

## 2023-09-15 NOTE — ED Notes (Signed)
Provided patient with 2 more warm blankets for comfort

## 2023-09-15 NOTE — Progress Notes (Signed)
PHARMACY - ANTICOAGULATION CONSULT NOTE  Pharmacy Consult for heparin Indication: DVT  Allergies  Allergen Reactions   Keflex [Cephalexin] Hives   Penicillins Other (See Comments)    Results thru an allergy skin test Has patient had a PCN reaction causing immediate rash, facial/tongue/throat swelling, SOB or lightheadedness with hypotension: No Has patient had a PCN reaction causing severe rash involving mucus membranes or skin necrosis: No Has patient had a PCN reaction that required hospitalization No Has patient had a PCN reaction occurring within the last 10 years: No If all of the above answers are "NO", then may proceed with Cephalosporin use.      Patient Measurements:   Heparin Dosing Weight: n/a. Use total body weight 54 kg  Vital Signs: Temp: 98.5 F (36.9 C) (11/25 0645) Temp Source: Oral (11/24 2345) BP: 129/62 (11/25 0645) Pulse Rate: 86 (11/25 0645)  Labs: Recent Labs    09/15/23 0045 09/15/23 0244  HGB 7.7*  --   HCT 23.6*  --   PLT 211  --   APTT 39*  --   LABPROT 17.2*  --   INR 1.4*  --   CREATININE 0.90  --   TROPONINIHS 5 5    Estimated Creatinine Clearance: 77 mL/min (by C-G formula based on SCr of 0.9 mg/dL).   Medical History: Past Medical History:  Diagnosis Date   Anxiety    Asthma    "grew out"   Chlamydia    Depression    doing really good   Gestational diabetes    with first two pregnancies   Hepatitis C 03/2015   History of heroin abuse (HCC)    Homelessness    Infection    UTI   Polysubstance abuse (HCC) 05/28/2018   UDS positive for cocaine, MJ, meth, opioids   Type A blood, Rh negative     Medications: No prescription medications PTA  Assessment: Pt is a 55 yoF with PMH significant for hep C, polysubstance abuse with ongoing IVDU. Venous doppler revealed findings consistent with acute DVT of the right subclavian vein, right axillary vein, and right brachial veins. CTA negative for PE. Pharmacy consulted to dose  heparin drip.   Pt also started on broad spectrum antibiotics for sepsis with arm wound/abscess/cellulitis and concern for septic arthritis, septic lung emboli. Ortho consulted for potential for I&D.   Baseline INR, aPTT elevated.  Today, 09/15/23 CBC: Hgb (7.7) low at baseline, Plt WNL  Reached out to ortho - planning for I&D today. Verbal order to turn off heparin now pre-op.   Goal of Therapy:  Heparin level 0.3-0.7 units/ml Monitor platelets by anticoagulation protocol: Yes   Plan:  Discontinue heparin at this time. Will await guidance from ortho on when safe to resume post-op.   Cindi Carbon, PharmD 09/15/2023,9:58 AM

## 2023-09-15 NOTE — Consult Note (Signed)
WOC Nurse Consult Note: Reason for Consult: multiple abscesses  Wound type: full thickness ulcerations  Pressure Injury POA: NA Measurement:see nursing notes Wound OZH:YQMVHQ/IONG  Drainage (amount, consistency, odor) see nursing flow  Periwound: erythema  Dressing procedure/placement/frequency: Cleanse UE wounds with Vashe (#[id number]), top with single layer of xeroform, top with foam. Discussed POC with patient and bedside nurse.  Re consult if needed, will not follow at this time. Thanks  Lathon Adan [employer], RN,CWOCN, CNS, CWON-AP [phone])

## 2023-09-15 NOTE — Progress Notes (Signed)
  Echocardiogram 2D Echocardiogram has been performed.  Ocie Doyne RDCS 09/15/2023, 2:38 PM

## 2023-09-15 NOTE — ED Notes (Signed)
Nurse in room to hold patients hand for comfort due to patient stating "I'm afraid" while fluid was removed from her right knee by Provider Tilden Fossa. Patient tolerated well.

## 2023-09-15 NOTE — Anesthesia Preprocedure Evaluation (Addendum)
Anesthesia Evaluation  Patient identified by MRN, date of birth, ID band Patient awake  General Assessment Comment:27 y.o.f w/ IVDA and polysubstance use-including cocaine, amphetamine,fentanyl iv, factor V Leiden deficiency in family, chronic smoking/tobacco use, hepatitis C-untreated, PTSD, substance-induced mood disorder/chronic anxiety and depression   Reviewed: Allergy & Precautions, H&P , NPO status , Patient's Chart, lab work & pertinent test results  Airway Mallampati: I  TM Distance: >3 FB Neck ROM: Full    Dental  (+) Dental Advisory Given, Missing, Poor Dentition, Chipped   Pulmonary Current Smoker and Patient abstained from smoking.   Pulmonary exam normal breath sounds clear to auscultation       Cardiovascular + DVT  Normal cardiovascular exam Rhythm:Regular Rate:Normal     Neuro/Psych negative neurological ROS  negative psych ROS   GI/Hepatic negative GI ROS,,,(+)     substance abuse  cocaine use, methamphetamine use and IV drug use, Hepatitis -, C  Endo/Other  diabetes    Renal/GU negative Renal ROS  negative genitourinary   Musculoskeletal negative musculoskeletal ROS (+)  narcotic dependent  Abdominal   Peds negative pediatric ROS (+)  Hematology  (+) Blood dyscrasia, anemia   Anesthesia Other Findings   Reproductive/Obstetrics negative OB ROS                             Anesthesia Physical Anesthesia Plan  ASA: 3  Anesthesia Plan: General   Post-op Pain Management: Tylenol PO (pre-op)*   Induction: Intravenous  PONV Risk Score and Plan: 2 and Ondansetron, Dexamethasone and Treatment may vary due to age or medical condition  Airway Management Planned: LMA  Additional Equipment:   Intra-op Plan:   Post-operative Plan: Extubation in OR  Informed Consent: I have reviewed the patients History and Physical, chart, labs and discussed the procedure including the  risks, benefits and alternatives for the proposed anesthesia with the patient or authorized representative who has indicated his/her understanding and acceptance.     Dental advisory given  Plan Discussed with: CRNA and Surgeon  Anesthesia Plan Comments:        Anesthesia Quick Evaluation

## 2023-09-15 NOTE — Consult Note (Signed)
Reason for Consult:Right knee septic joint Referring Physician: Lanae Boast Time called: [id number] Time at bedside: 0953   Anila E Yaklin is an 27 y.o. female.  HPI: P[place]scilla began to develop BUE ulcers about 3w ago. She was managing these at home but began to have severe right knee pain about 4d ago. She came to the ED last night and arthrocentesis was c/w a septic joint and orthopedic surgery was consulted. She also underwent CT angio of the RUE which did not show any abscesses but did show DVT. She is LHD.  Past Medical History:  Diagnosis Date   Anxiety    Asthma    "grew out"   Chlamydia    Depression    doing really good   Gestational diabetes    with first two pregnancies   Hepatitis C 03/2015   History of heroin abuse (HCC)    Homelessness    Infection    UTI   Polysubstance abuse (HCC) 05/28/2018   UDS positive for cocaine, MJ, meth, opioids   Type A blood, Rh negative     Past Surgical History:  Procedure Laterality Date   CESAREAN SECTION N/A 01/11/2016   Procedure: CESAREAN SECTION;  Surgeon: Brock Bad, MD;  Location: [facility];  Service: Obstetrics;  Laterality: N/A;   CESAREAN SECTION N/A 12/21/2016   Procedure: REPEAT CESAREAN SECTION;  Surgeon: Adam Phenix, MD;  Location: [facility] BIRTHING SUITES;  Service: Obstetrics;  Laterality: N/A;   CESAREAN SECTION N/A 06/05/2018   Procedure: REPEAT CESAREAN SECTION;  Surgeon: [name], MD;  Location: [facility] BIRTHING SUITES;  Service: Obstetrics;  Laterality: N/A;   CESAREAN SECTION WITH BILATERAL TUBAL LIGATION Bilateral 03/08/2020   Procedure: CESAREAN SECTION WITH BILATERAL TUBAL LIGATION;  Surgeon: Kathrynn Running, MD;  Location: [facility] LD ORS;  Service: Obstetrics;  Laterality: Bilateral;    Family History  Problem Relation Age of Onset   Diabetes Mother    Pulmonary embolism Mother    Stroke Mother    Cancer Paternal Grandmother        liver   Epilepsy Brother     Social History:  reports that she has been smoking  cigarettes. She has never used smokeless tobacco. She reports that she does not currently use alcohol. She reports that she does not currently use drugs after having used the following drugs: Marijuana, Heroin, and Other-see comments.  Allergies:  Allergies  Allergen Reactions   Keflex [Cephalexin] Hives   Penicillins Other (See Comments)    Results thru an allergy skin test Has patient had a PCN reaction causing immediate rash, facial/tongue/throat swelling, SOB or lightheadedness with hypotension: No Has patient had a PCN reaction causing severe rash involving mucus membranes or skin necrosis: No Has patient had a PCN reaction that required hospitalization No Has patient had a PCN reaction occurring within the last 10 years: No If all of the above answers are "NO", then may proceed with Cephalosporin use.      Medications: I have reviewed the patient's current medications.  Results for orders placed or performed during the hospital encounter of 09/14/23 (from the past 48 hour(s))  Resp panel by RT-PCR (RSV, Flu A&B, Covid) Urine, Clean Catch     Status: None   Collection Time: 09/15/23 12:45 AM   Specimen: Urine, Clean Catch; Nasal Swab  Result Value Ref Range   SARS Coronavirus 2 by RT PCR NEGATIVE NEGATIVE    Comment: (NOTE) SARS-CoV-2 target nucleic acids are NOT DETECTED.  The  SARS-CoV-2 RNA is generally detectable in upper respiratory specimens during the acute phase of infection. The lowest concentration of SARS-CoV-2 viral copies this assay can detect is 138 copies/mL. A negative result does not preclude SARS-Cov-2 infection and should not be used as the sole basis for treatment or other patient management decisions. A negative result may occur with  improper specimen collection/handling, submission of specimen other than nasopharyngeal swab, presence of viral mutation(s) within the areas targeted by this assay, and inadequate number of viral copies(<138 copies/mL). A  negative result must be combined with clinical observations, patient history, and epidemiological information. The expected result is Negative.  Fact Sheet for Patients:  BloggerCourse.com  Fact Sheet for Healthcare Providers:  SeriousBroker.it  This test is no t yet approved or cleared by the [place] FDA and  has been authorized for detection and/or diagnosis of SARS-CoV-2 by FDA under an Emergency Use Authorization (EUA). This EUA will remain  in effect (meaning this test can be used) for the duration of the COVID-19 declaration under Section 564(b)(1) of the Act, 21 U.S.C.section 360bbb-3(b)(1), unless the authorization is terminated  or revoked sooner.       Influenza A by PCR NEGATIVE NEGATIVE   Influenza B by PCR NEGATIVE NEGATIVE    Comment: (NOTE) The Xpert Xpress SARS-CoV-2/FLU/RSV plus assay is intended as an aid in the diagnosis of influenza from Nasopharyngeal swab specimens and should not be used as a sole basis for treatment. Nasal washings and aspirates are unacceptable for Xpert Xpress SARS-CoV-2/FLU/RSV testing.  Fact Sheet for Patients: BloggerCourse.com  Fact Sheet for Healthcare Providers: SeriousBroker.it  This test is not yet approved or cleared by the [place] FDA and has been authorized for detection and/or diagnosis of SARS-CoV-2 by FDA under an Emergency Use Authorization (EUA). This EUA will remain in effect (meaning this test can be used) for the duration of the COVID-19 declaration under Section 564(b)(1) of the Act, 21 U.S.C. section 360bbb-3(b)(1), unless the authorization is terminated or revoked.     Resp Syncytial Virus by PCR NEGATIVE NEGATIVE    Comment: (NOTE) Fact Sheet for Patients: BloggerCourse.com  Fact Sheet for Healthcare Providers: SeriousBroker.it  This test is not  yet approved or cleared by the [place] FDA and has been authorized for detection and/or diagnosis of SARS-CoV-2 by FDA under an Emergency Use Authorization (EUA). This EUA will remain in effect (meaning this test can be used) for the duration of the COVID-19 declaration under Section 564(b)(1) of the Act, 21 U.S.C. section 360bbb-3(b)(1), unless the authorization is terminated or revoked.  Performed at [facility], [address] [place], [place] [address]   Comprehensive metabolic panel     Status: Abnormal   Collection Time: 09/15/23 12:45 AM  Result Value Ref Range   Sodium 135 135 - 145 mmol/L   Potassium 3.5 3.5 - 5.1 mmol/L   Chloride 102 98 - 111 mmol/L   CO2 22 22 - 32 mmol/L   Glucose, Bld 123 (H) 70 - 99 mg/dL    Comment: Glucose reference range applies only to samples taken after fasting for at least 8 hours.   BUN 19 6 - 20 mg/dL   Creatinine, Ser 2.95 0.44 - 1.00 mg/dL   Calcium 7.7 (L) 8.9 - 10.3 mg/dL   Total Protein 7.0 6.5 - 8.1 g/dL   Albumin 2.5 (L) 3.5 - 5.0 g/dL   AST 34 15 - 41 U/L   ALT 127 (H) 0 - 44 U/L  Alkaline Phosphatase 135 (H) 38 - 126 U/L   Total Bilirubin 1.7 (H) <1.2 mg/dL   GFR, Estimated >40 >98 mL/min    Comment: (NOTE) Calculated using the CKD-EPI Creatinine Equation (2021)    Anion gap 11 5 - 15    Comment: Performed at [facility], [address], [place], [place] [address]  CBC with Differential     Status: Abnormal   Collection Time: 09/15/23 12:45 AM  Result Value Ref Range   WBC 11.0 (H) 4.0 - 10.5 K/uL   RBC 3.15 (L) 3.87 - 5.11 MIL/uL   Hemoglobin 7.7 (L) 12.0 - 15.0 g/dL    Comment: Reticulocyte Hemoglobin testing may be clinically indicated, consider ordering this additional test [id number]    HCT 23.6 (L) 36.0 - 46.0 %   MCV 74.9 (L) 80.0 - 100.0 fL   MCH 24.4 (L) 26.0 - 34.0 pg   MCHC 32.6 30.0 - 36.0 g/dL   RDW 30.8 (H) 65.7 - 84.6 %   Platelets 211 150 - 400 K/uL    nRBC 0.0 0.0 - 0.2 %   Neutrophils Relative % 79 %   Neutro Abs 8.9 (H) 1.7 - 7.7 K/uL   Lymphocytes Relative 15 %   Lymphs Abs 1.8 0.7 - 4.0 K/uL   Monocytes Relative 5 %   Monocytes Absolute 0.5 0.1 - 1.0 K/uL   Eosinophils Relative 0 %   Eosinophils Absolute 0.0 0.0 - 0.5 K/uL   Basophils Relative 0 %   Basophils Absolute 0.0 0.0 - 0.1 K/uL   Immature Granulocytes 1 %   Abs Immature Granulocytes 0.11 (H) 0.00 - 0.07 K/uL    Comment: Performed at [facility], [address] [place], [place] [address]  Protime-INR     Status: Abnormal   Collection Time: 09/15/23 12:45 AM  Result Value Ref Range   Prothrombin Time 17.2 (H) 11.4 - 15.2 seconds   INR 1.4 (H) 0.8 - 1.2    Comment: (NOTE) INR goal varies based on device and disease states. Performed at [facility], [address], [place], [place] [address]   APTT     Status: Abnormal   Collection Time: 09/15/23 12:45 AM  Result Value Ref Range   aPTT 39 (H) 24 - 36 seconds    Comment:        IF BASELINE aPTT IS ELEVATED, SUGGEST PATIENT RISK ASSESSMENT BE USED TO DETERMINE APPROPRIATE ANTICOAGULANT THERAPY. Performed at [facility], [address], [place], [place] [address]   Blood Culture (routine x 2)     Status: None (Preliminary result)   Collection Time: 09/15/23 12:45 AM   Specimen: BLOOD LEFT FOREARM  Result Value Ref Range   Specimen Description BLOOD LEFT FOREARM    Special Requests      BOTTLES DRAWN AEROBIC AND ANAEROBIC Blood Culture adequate volume Performed at [facility], [address] [place], [place] [address]    Culture PENDING    Report Status PENDING   Urinalysis, w/ Reflex to Culture (Infection Suspected) -Urine, Clean Catch     Status: Abnormal   Collection Time: 09/15/23 12:45 AM  Result Value Ref Range   Specimen Source URINE, CLEAN CATCH    Color, Urine AMBER (A) YELLOW    Comment: BIOCHEMICALS MAY BE AFFECTED BY COLOR    APPearance CLEAR CLEAR   Specific Gravity, Urine 1.026 1.005 - 1.030   pH 5.0 5.0 - 8.0   Glucose, UA NEGATIVE  NEGATIVE mg/dL   Hgb urine dipstick SMALL (A) NEGATIVE   Bilirubin Urine NEGATIVE NEGATIVE   Ketones, ur NEGATIVE NEGATIVE mg/dL   Protein, ur 30 (A) NEGATIVE mg/dL   Nitrite NEGATIVE NEGATIVE   Leukocytes,Ua TRACE (A) NEGATIVE   RBC / HPF 0-5 0 - 5 RBC/hpf   WBC, UA 6-10 0 - 5 WBC/hpf    Comment:        Reflex urine culture not performed if WBC <=10, OR if Squamous epithelial cells >5. If Squamous epithelial cells >5 suggest recollection.    Bacteria, UA NONE SEEN NONE SEEN   Squamous Epithelial / HPF 0-5 0 - 5 /HPF   Mucus PRESENT     Comment: Performed at [facility], [address], [place], [place] [address]  Troponin I (High Sensitivity)     Status: None   Collection Time: 09/15/23 12:45 AM  Result Value Ref Range   Troponin I (High Sensitivity) 5 <18 ng/L    Comment: (NOTE) Elevated high sensitivity troponin I (hsTnI) values and significant  changes across serial measurements may suggest ACS but many other  chronic and acute conditions are known to elevate hsTnI results.  Refer to the "Links" section for chest pain algorithms and additional  guidance. Performed at [facility], [address], [place], [place] [address]   Pregnancy, urine     Status: None   Collection Time: 09/15/23 12:45 AM  Result Value Ref Range   Preg Test, Ur NEGATIVE NEGATIVE    Comment:        THE SENSITIVITY OF THIS METHODOLOGY IS >25 mIU/mL. Performed at [facility], [address] [place], [place] [address]   Blood Culture (routine x 2)     Status: None (Preliminary result)   Collection Time: 09/15/23  1:03 AM   Specimen: BLOOD LEFT ARM  Result Value Ref Range   Specimen Description BLOOD LEFT ARM    Special Requests      BOTTLES DRAWN AEROBIC AND ANAEROBIC Blood Culture adequate volume Performed at [facility], [address], [place], [place] [address]    Culture PENDING    Report Status PENDING   Aerobic/Anaerobic Culture w Gram Stain (surgical/deep wound)     Status: None (Preliminary result)   Collection Time: 09/15/23  1:43 AM   Specimen: BLOOD LEFT FOREARM; Abscess  Result Value Ref Range   Specimen Description      BLOOD LEFT FOREARM Performed at [facility], [address] [place], [place] [address]    Special Requests      NONE Performed at [facility], [address], [place], [place] [address]    Gram Stain      FEW WBC PRESENT, PREDOMINANTLY MONONUCLEAR FEW GRAM POSITIVE COCCI IN PAIRS AND CHAINS FEW GRAM VARIABLE ROD Performed at [facility], [address], [place], [place] [address]    Culture PENDING    Report Status PENDING   Type and screen [facility]     Status: None   Collection Time: 09/15/23  1:43 AM  Result Value Ref Range   ABO/RH(D) A NEG    Antibody Screen NEG    Sample Expiration      09/18/2023,2359 Performed at [facility], [address], [place], [place] [address]   Lactic acid, plasma     Status: None   Collection Time: 09/15/23  1:43 AM  Result Value Ref Range  Lactic Acid, Venous 1.2 0.5 - 1.9 mmol/L    Comment: Performed at [facility], [address] [place], [place] [address]  Body fluid culture w Gram Stain     Status: None (Preliminary result)   Collection Time: 09/15/23  2:37 AM   Specimen: KNEE; Body Fluid  Result Value Ref Range   Specimen Description      KNEE RIGHT Performed at [facility], [address], [place], [place] [address]    Special Requests      NONE Performed at [facility], [address] [place], [place] [address]    Gram Stain      FEW WBC PRESENT,BOTH PMN AND MONONUCLEAR RARE GRAM POSITIVE COCCI IN PAIRS Performed at [facility], [address] [place], [place] [address]    Culture PENDING    Report Status PENDING   Cell count + diff,  w/ cryst-synvl fld     Status: Abnormal   Collection Time: 09/15/23  2:37 AM  Result Value Ref Range   Color, Synovial RED (A) YELLOW   Appearance-Synovial CLOUDY (A) CLEAR   Crystals, Fluid NO CRYSTALS SEEN    WBC, Synovial 63,200 (H) 0 - 200 /cu mm   Neutrophil, Synovial 84 (H) 0 - 25 %   Lymphocytes-Synovial Fld 3 0 - 20 %   Monocyte-Macrophage-Synovial Fluid 8 (L) 50 - 90 %   Eosinophils-Synovial 5 (H) 0 - 1 %   Other Cells-SYN      Intra and/or extracellular organisms present, correlate with Microbiology.    Comment: Performed at [facility], [address] [place], [place] [address]  Troponin I (High Sensitivity)     Status: None   Collection Time: 09/15/23  2:44 AM  Result Value Ref Range   Troponin I (High Sensitivity) 5 <18 ng/L    Comment: (NOTE) Elevated high sensitivity troponin I (hsTnI) values and significant  changes across serial measurements may suggest ACS but many other  chronic and acute conditions are known to elevate hsTnI results.  Refer to the "Links" section for chest pain algorithms and additional  guidance. Performed at [facility], [address], [place], [place] [address]   Rapid urine drug screen (hospital performed)     Status: Abnormal   Collection Time: 09/15/23  5:40 AM  Result Value Ref Range   Opiates POSITIVE (A) NONE DETECTED   Cocaine POSITIVE (A) NONE DETECTED   Benzodiazepines NONE DETECTED NONE DETECTED   Amphetamines POSITIVE (A) NONE DETECTED   Tetrahydrocannabinol POSITIVE (A) NONE DETECTED   Barbiturates NONE DETECTED NONE DETECTED    Comment: (NOTE) DRUG SCREEN FOR MEDICAL PURPOSES ONLY.  IF CONFIRMATION IS NEEDED FOR ANY PURPOSE, NOTIFY LAB WITHIN 5 DAYS.  LOWEST DETECTABLE LIMITS FOR URINE DRUG SCREEN Drug Class                     Cutoff (ng/mL) Amphetamine and metabolites     1000 Barbiturate and metabolites    200 Benzodiazepine                 200 Opiates and metabolites        300 Cocaine and metabolites        300 THC                            50 Performed at [facility], [address], [place], [place] [address]   Type and screen [facility]     Status: None (Preliminary result)   Collection Time: 09/15/23  8:12 AM  Result Value Ref Range   ABO/RH(D) A NEG    Antibody Screen NEG    Sample Expiration 09/18/2023,2359    Unit Number [id number]    Blood Component Type RED CELLS,LR    Unit division 00    Status of Unit ALLOCATED    Transfusion Status OK TO TRANSFUSE    Crossmatch Result      Compatible Performed at [facility], [address] [place], [place] [address]    Unit Number [id number]    Blood Component Type RBC LR PHER1    Unit division 00    Status of Unit ALLOCATED    Transfusion Status OK TO TRANSFUSE    Crossmatch Result Compatible   Prepare RBC (crossmatch)     Status: None   Collection Time: 09/15/23  8:12 AM  Result Value Ref Range   Order Confirmation      ORDER PROCESSED BY BLOOD BANK Performed at [facility], [address], [place], [place] [address]   TSH     Status: None   Collection Time: 09/15/23  8:12 AM  Result Value Ref Range   TSH 1.366 0.350 - 4.500 uIU/mL    Comment: Performed by a 3rd Generation assay with a functional sensitivity of <=0.01 uIU/mL. Performed at [facility], [address], [place], [place] [address]     VAS [place] UPPER EXTREMITY VENOUS DUPLEX  Result Date: 09/15/2023 UPPER VENOUS STUDY  Patient Name:  SOPHEY STREHLE Richwine  Date of Exam:   09/15/2023 Medical Rec #: [id number]      Accession #:    [id number] Date of Birth: [birth year]      Patient Gender: F Patient Age:   89 years Exam Location:  [facility] Procedure:      VAS [place] UPPER EXTREMITY VENOUS DUPLEX Referring Phys: Eulah Pont SUNDIL  --------------------------------------------------------------------------------  Indications: Filling defects w/CT Risk Factors: None identified. Limitations: Open wound. Comparison Study: 09/14/2024 - CT Extrem Up Entire Arm R W/CM                   IMPRESSION:                   1. Diffuse soft tissue thickening involving the right upper                   extremity with areas of nodular thickening in the skin. No                   abscess                   is seen.                   2. Filling defects in the subclavian, axillary, and basilic                   veins                   suspicious for deep venous thrombosis. Ultrasound is                   recommended for                   further evaluation.  3. No acute osseous abnormality. Performing Technologist: Chanda Busing RVT  Examination Guidelines: A complete evaluation includes B-mode imaging, spectral Doppler, color Doppler, and power Doppler as needed of all accessible portions of each vessel. Bilateral testing is considered an integral part of a complete examination. Limited examinations for reoccurring indications may be performed as noted.  Right Findings: +----------+------------+---------+-----------+----------+-------+ RIGHT     CompressiblePhasicitySpontaneousPropertiesSummary +----------+------------+---------+-----------+----------+-------+ IJV           Full       Yes       Yes                      +----------+------------+---------+-----------+----------+-------+ Subclavian    None       No        No                Acute  +----------+------------+---------+-----------+----------+-------+ Axillary      None       No        No                Acute  +----------+------------+---------+-----------+----------+-------+ Brachial    Partial      No        No                Acute  +----------+------------+---------+-----------+----------+-------+ Radial        Full                                           +----------+------------+---------+-----------+----------+-------+ Ulnar         Full                                          +----------+------------+---------+-----------+----------+-------+ Cephalic      None                                   Acute  +----------+------------+---------+-----------+----------+-------+ Basilic       None                                   Acute  +----------+------------+---------+-----------+----------+-------+  Left Findings: +----------+------------+---------+-----------+----------+-------+ LEFT      CompressiblePhasicitySpontaneousPropertiesSummary +----------+------------+---------+-----------+----------+-------+ Subclavian    Full       Yes       Yes                      +----------+------------+---------+-----------+----------+-------+  Summary:  Right: Findings consistent with acute deep vein thrombosis involving the right subclavian vein, right axillary vein and right brachial veins. Findings consistent with acute superficial vein thrombosis involving the right basilic vein and right cephalic vein.  Left: No evidence of thrombosis in the subclavian.  *See table(s) above for measurements and observations.     Preliminary    US Abdomen Complete  Result Date: 09/15/2023 CLINICAL DATA:  27 year old female with chest pain and shortness of breath. EXAM: ABDOMEN ULTRASOUND COMPLETE COMPARISON:  CTA chest 0342 hours today. FINDINGS: Gallbladder: Partially contracted. This appears to account for the appearance of the gallbladder wall on image 3. No sludge or stones identified. No sonographic Murphy sign elicited. Common bile  duct: Diameter: Could not be visualized. Liver: No focal lesion identified. Within normal limits in parenchymal echogenicity. Portal vein is patent on color Doppler imaging with normal direction of blood flow towards the liver. IVC: No abnormality visualized. Pancreas: Visualized portion unremarkable. Spleen:  Splenomegaly. Estimated splenic volume 695 mL (normal splenic volume range 83 - 412 mL). No discrete splenic lesion. Right Kidney: Length: 11.4 cm. Echogenicity within normal limits. No mass or hydronephrosis visualized. Left Kidney: Length: 12.6 cm. Echogenicity within normal limits. No mass or hydronephrosis visualized. Abdominal aorta: No aneurysm visualized. Other findings: No free fluid identified. IMPRESSION: 1. Splenomegaly. 2. Partially contracted gallbladder. No evidence of cholelithiasis or acute cholecystitis. 3. Common bile duct could not be visualized but otherwise negative ultrasound appearance of the liver. Electronically Signed   By: Odessa Fleming M.D.   On: 09/15/2023 07:14   CT Extrem Up Entire Arm R W/CM  Result Date: 09/15/2023 CLINICAL DATA:  Soft tissue mass, forearm, concern for abscess. EXAM: CT OF THE UPPER RIGHT EXTREMITY WITH CONTRAST TECHNIQUE: Multidetector CT imaging of the upper right extremity was performed according to the standard protocol following intravenous contrast administration. RADIATION DOSE REDUCTION: This exam was performed according to the departmental dose-optimization program which includes automated exposure control, adjustment of the mA and/or kV according to patient size and/or use of iterative reconstruction technique. CONTRAST:  OMNIPAQUE IOHEXOL 350 MG/ML SOLN COMPARISON:  None Available. FINDINGS: Bones/Joint/Cartilage No acute fracture or dislocation is seen. No bony erosions or periosteal elevation is identified. No joint effusion is identified. Ligaments Suboptimally assessed by CT. Muscles and Tendons No intramuscular abscess or edema. Soft tissues Diffuse subcutaneous edema is noted in the right upper extremity. No subcutaneous emphysema is seen. Focal subcutaneous nodular densities are noted in the dermis at the antecubital space, mid forearm, and wrist. No abscess is seen. Filling defects are present in the basilic, axillary, and subclavian veins.  There is suboptimal opacification of the radial and ulnar veins. No other acute abnormality is seen. IMPRESSION: 1. Diffuse soft tissue thickening involving the right upper extremity with areas of nodular thickening in the skin. No abscess is seen. 2. Filling defects in the subclavian, axillary, and basilic veins suspicious for deep venous thrombosis. Ultrasound is recommended for further evaluation. 3. No acute osseous abnormality. Critical Value/emergent results were called by telephone at the time of interpretation on 09/15/2023 at 4:53 am to provider [facility] , who verbally acknowledged these results. Electronically Signed   By: Thornell Sartorius M.D.   On: 09/15/2023 04:55   CT Angio Chest PE W and/or Wo Contrast  Result Date: 09/15/2023 CLINICAL DATA:  Pulmonary embolism suspected, high probability. Chest pain and shortness of breath. Family history of factor 5 Leiden. EXAM: CT ANGIOGRAPHY CHEST WITH CONTRAST TECHNIQUE: Multidetector CT imaging of the chest was performed using the standard protocol during bolus administration of intravenous contrast. Multiplanar CT image reconstructions and MIPs were obtained to evaluate the vascular anatomy. RADIATION DOSE REDUCTION: This exam was performed according to the departmental dose-optimization program which includes automated exposure control, adjustment of the mA and/or kV according to patient size and/or use of iterative reconstruction technique. CONTRAST:  OMNIPAQUE IOHEXOL 350 MG/ML SOLN COMPARISON:  None Available. FINDINGS: Cardiovascular: The heart is normal in size and there is no pericardial effusion. The aorta and pulmonary trunk are normal in caliber. No evidence of pulmonary embolism is seen. Mediastinum/Nodes: No mediastinal or hilar lymphadenopathy is seen. Prominent lymph nodes are noted in the right axilla, likely  reactive. There is a hypodense nodule in the left lobe of the thyroid gland measuring 1.7 cm. The trachea and esophagus are  within normal limits. Lungs/Pleura: Central bronchial wall thickening is present bilaterally. Nodular opacities are noted in the right upper lobe measuring 1 cm, axial image 55 and 1 cm axial image 82. There is a 9 mm nodular opacity in the right lower lobe, axial image 109. 5 mm nodule is present in the left lower lobe, axial image 82. No effusion or pneumothorax is seen. Upper Abdomen: Hypervascular focus is present in the anterior right lobe of the liver, possible flash filling hemangioma. No acute abnormality is seen. Musculoskeletal: No acute osseous abnormality. Review of the MIP images confirms the above findings. IMPRESSION: 1. No evidence of pulmonary embolism. 2. Scattered nodular opacities in the lungs bilaterally measuring up to 1 cm, which may be infectious, inflammatory, or neoplastic. Non-contrast chest CT at 3-6 months is recommended. If the nodules are stable at time of repeat CT, then future CT at 18-24 months (from today's scan) is considered optional for low-risk patients, but is recommended for high-risk patients. This recommendation follows the consensus statement: Guidelines for Management of Incidental Pulmonary Nodules Detected on CT Images: From the Fleischner Society 2017; Radiology 2017; 284:228-243. 3. 1.7 cm left thyroid nodule. Nonemergent thyroid ultrasound is recommended. Electronically Signed   By: Thornell Sartorius M.D.   On: 09/15/2023 04:25   DG Chest Port 1 View  Result Date: 09/15/2023 CLINICAL DATA:  Possible sepsis. EXAM: PORTABLE CHEST 1 VIEW COMPARISON:  07/11/2014. FINDINGS: The heart size and mediastinal contours are within normal limits. No consolidation, effusion, or pneumothorax. No acute osseous abnormality. IMPRESSION: No active disease. Electronically Signed   By: Thornell Sartorius M.D.   On: 09/15/2023 01:48   DG Knee Complete 4 Views Right  Result Date: 09/15/2023 CLINICAL DATA:  Questionable sepsis EXAM: RIGHT KNEE - COMPLETE 4+ VIEW COMPARISON:  None  Available. FINDINGS: No evidence of fracture, dislocation, or joint effusion. No evidence of arthropathy or other focal bone abnormality. Soft tissues are unremarkable. IMPRESSION: Negative. Electronically Signed   By: Darliss Cheney M.D.   On: 09/15/2023 01:48   DG Elbow Complete Right  Result Date: 09/15/2023 CLINICAL DATA:  Possible sepsis. EXAM: RIGHT ELBOW - COMPLETE 3+ VIEW COMPARISON:  None Available. FINDINGS: There is no evidence of acute fracture, dislocation, or joint effusion. There is no evidence of arthropathy or other focal bone abnormality. Diffuse soft tissue swelling is seen. IMPRESSION: No acute osseous abnormality. Electronically Signed   By: Thornell Sartorius M.D.   On: 09/15/2023 01:48    Review of Systems  Constitutional:  Negative for chills, diaphoresis and fever.  HENT:  Negative for ear discharge, ear pain, hearing loss and tinnitus.   Eyes:  Negative for photophobia and pain.  Respiratory:  Negative for cough and shortness of breath.   Cardiovascular:  Negative for chest pain.  Gastrointestinal:  Negative for abdominal pain, nausea and vomiting.  Genitourinary:  Negative for dysuria, flank pain, frequency and urgency.  Musculoskeletal:  Positive for arthralgias (Right knee, bilateral FA). Negative for back pain, myalgias and neck pain.  Neurological:  Negative for dizziness and headaches.  Hematological:  Does not bruise/bleed easily.  Psychiatric/Behavioral:  The patient is not nervous/anxious.    Blood pressure 129/62, pulse 86, temperature 98.5 F (36.9 C), resp. rate 16, last menstrual period 07/29/2023, SpO2 99%, unknown if currently breastfeeding. Physical Exam Constitutional:      General: She is not  in acute distress.    Appearance: She is well-developed. She is not diaphoretic.  HENT:     Head: Normocephalic and atraumatic.  Eyes:     General: No scleral icterus.       Right eye: No discharge.        Left eye: No discharge.     Conjunctiva/sclera:  Conjunctivae normal.  Cardiovascular:     Rate and Rhythm: Normal rate and regular rhythm.  Pulmonary:     Effort: Pulmonary effort is normal. No respiratory distress.  Musculoskeletal:     Cervical back: Normal range of motion.     Comments: Bilateral shoulder, elbow, wrist, digits- Multiple scattered ulcerations, RUE edematous right FA volar FA nodules, mild diffuse TTP, no instability, no blocks to motion  Sens  Ax/R/M/U intact  Mot   Ax/ R/ PIN/ M/ AIN/ U intact  Rad 2+  RLE No traumatic wounds, ecchymosis, or rash  Mod TTP but able to AROM 15 degrees  No ankle effusion  Sens DPN, SPN, TN intact  Motor EHL, ext, flex, evers 5/5  DP 2+, PT 2+, No significant edema  Skin:    General: Skin is warm and dry.  Neurological:     Mental Status: She is alert.  Psychiatric:        Mood and Affect: Mood normal.        Behavior: Behavior normal.     Assessment/Plan: Right knee septic joint -- Plan I&D today with Dr. Blanchie Dessert. Please keep NPO. BUE lesions -- Will get CT LUE to r/o abscess but no obvious surgical lesions or septic joints present. Multiple medical problems including polysubstance use-include cocaine, amphetamine use disorder, polysubstance dependence, chronic smoking/tobacco use, hepatitis C-untreated, posttraumatic stress disorder, substance-induced mood disorder, chronic anxiety, chronic depression, and IV drug  -- per primary service    Freeman Caldron, PA-C Orthopedic Surgery [phone] 09/15/2023, 9:59 AM

## 2023-09-15 NOTE — Progress Notes (Addendum)
PHARMACY - ANTICOAGULATION CONSULT NOTE  Pharmacy Consult for Heparin Indication: DVT  Allergies  Allergen Reactions   Keflex [Cephalexin] Hives   Penicillins Other (See Comments)    Results thru an allergy skin test Has patient had a PCN reaction causing immediate rash, facial/tongue/throat swelling, SOB or lightheadedness with hypotension: No Has patient had a PCN reaction causing severe rash involving mucus membranes or skin necrosis: No Has patient had a PCN reaction that required hospitalization No Has patient had a PCN reaction occurring within the last 10 years: No If all of the above answers are "NO", then may proceed with Cephalosporin use.      Patient Measurements:   Height = 63" Weight 54.4 kg (09/12/2023) Heparin Dosing Weight: actual body weight  Vital Signs: Temp: 98.6 F (37 C) (11/25 0400) Temp Source: Oral (11/24 2345) BP: 93/50 (11/25 0330) Pulse Rate: 83 (11/25 0330)  Labs: Recent Labs    09/15/23 0045 09/15/23 0244  HGB 7.7*  --   HCT 23.6*  --   PLT 211  --   APTT 39*  --   LABPROT 17.2*  --   INR 1.4*  --   CREATININE 0.90  --   TROPONINIHS 5 5    Estimated Creatinine Clearance: 77 mL/min (by C-G formula based on SCr of 0.9 mg/dL).   Medical History: Past Medical History:  Diagnosis Date   Anxiety    Asthma    "grew out"   Chlamydia    Depression    doing really good   Gestational diabetes    with first two pregnancies   Hepatitis C 03/2015   History of heroin abuse (HCC)    Homelessness    Infection    UTI   Polysubstance abuse (HCC) 05/28/2018   UDS positive for cocaine, MJ, meth, opioids   Type A blood, Rh negative     Medications:  No oral anticoagulation PTA  Assessment: 26 yr female with arm abscesses.  H/O IVDA, hepatitis C.  Patient also reports bilateral knee pain; family history of Factor V Leiden Baseline INR = 1.4 and aPTT = 39 sec; both elevated Hg on admission = 7.7; PLTC wnl CTAngio negative for PE CT  of right arm suspicious for DVT Pharmacy consulted to start IV heparin for DVT Possible surgical intervention may be required  Goal of Therapy:  Heparin level 0.3-0.7 units/ml Monitor platelets by anticoagulation protocol: Yes   Plan:  Heparin 3000 unit IV bolus x 1 Heparin gtt @ 950 units/hr Check heparin level 6 hr after infusion started Daily heparin level & CBC Monitor for signs & symptoms of bleeding  Maryellen Pile, PharmD 09/15/2023,5:03 AM

## 2023-09-15 NOTE — Progress Notes (Signed)
Pharmacy Antibiotic Note  Laurie [name] is a 27 y.o. female admitted on 09/14/2023 with sepsis.  PMH significant for polysubstance abuse, IVDA, hepatitis C.  Patient with multiple abscess in her arm and c/o bilateral knee pain.  Pharmacy has been consulted for Vancomycin and Meropenem dosing.  Plan: Meropenem 1gm IV q8h Vancomycin 1gm IV x 1 followed by Vancomycin 750 mg IV Q 12 hrs. Goal AUC 400-600  Expected AUC: 560.7  SCr used: 0.9 Follow renal function F/u culture results and sensitivities    Temp (24hrs), Avg:98.5 F (36.9 C), Min:98.4 F (36.9 C), Max:98.6 F (37 C)  Recent Labs  Lab 09/15/23 0045 09/15/23 0143  WBC 11.0*  --   CREATININE 0.90  --   LATICACIDVEN  --  1.2    Estimated Creatinine Clearance: 77 mL/min (by C-G formula based on SCr of 0.9 mg/dL).    Allergies  Allergen Reactions   Keflex [Cephalexin] Hives   Penicillins Other (See Comments)    Results thru an allergy skin test Has patient had a PCN reaction causing immediate rash, facial/tongue/throat swelling, SOB or lightheadedness with hypotension: No Has patient had a PCN reaction causing severe rash involving mucus membranes or skin necrosis: No Has patient had a PCN reaction that required hospitalization No Has patient had a PCN reaction occurring within the last 10 years: No If all of the above answers are "NO", then may proceed with Cephalosporin use.      Antimicrobials this admission: 11/25 Meropenem >>   11/25 Vancomycin >>    Dose adjustments this admission:    Microbiology results: 11/25 BCx:   11/25 Body Fluid (knee joint):       Thank you for allowing pharmacy to be a part of this patient's care.  Maryellen Pile, PharmD 09/15/2023 5:35 AM

## 2023-09-16 ENCOUNTER — Encounter (HOSPITAL_COMMUNITY): Payer: Self-pay | Admitting: Orthopedic Surgery

## 2023-09-16 DIAGNOSIS — F1190 Opioid use, unspecified, uncomplicated: Secondary | ICD-10-CM

## 2023-09-16 DIAGNOSIS — F1990 Other psychoactive substance use, unspecified, uncomplicated: Secondary | ICD-10-CM | POA: Diagnosis not present

## 2023-09-16 DIAGNOSIS — A419 Sepsis, unspecified organism: Secondary | ICD-10-CM | POA: Diagnosis not present

## 2023-09-16 DIAGNOSIS — L02415 Cutaneous abscess of right lower limb: Secondary | ICD-10-CM | POA: Diagnosis not present

## 2023-09-16 DIAGNOSIS — B950 Streptococcus, group A, as the cause of diseases classified elsewhere: Secondary | ICD-10-CM | POA: Diagnosis not present

## 2023-09-16 DIAGNOSIS — I82621 Acute embolism and thrombosis of deep veins of right upper extremity: Secondary | ICD-10-CM | POA: Diagnosis not present

## 2023-09-16 DIAGNOSIS — R7881 Bacteremia: Secondary | ICD-10-CM

## 2023-09-16 DIAGNOSIS — M00261 Other streptococcal arthritis, right knee: Principal | ICD-10-CM

## 2023-09-16 DIAGNOSIS — M009 Pyogenic arthritis, unspecified: Secondary | ICD-10-CM | POA: Diagnosis not present

## 2023-09-16 DIAGNOSIS — R918 Other nonspecific abnormal finding of lung field: Secondary | ICD-10-CM | POA: Diagnosis not present

## 2023-09-16 DIAGNOSIS — B955 Unspecified streptococcus as the cause of diseases classified elsewhere: Secondary | ICD-10-CM

## 2023-09-16 LAB — CBC
HCT: 21.6 % — ABNORMAL LOW (ref 36.0–46.0)
Hemoglobin: 6.7 g/dL — CL (ref 12.0–15.0)
MCH: 24.1 pg — ABNORMAL LOW (ref 26.0–34.0)
MCHC: 31 g/dL (ref 30.0–36.0)
MCV: 77.7 fL — ABNORMAL LOW (ref 80.0–100.0)
Platelets: 238 10*3/uL (ref 150–400)
RBC: 2.78 MIL/uL — ABNORMAL LOW (ref 3.87–5.11)
RDW: 19.2 % — ABNORMAL HIGH (ref 11.5–15.5)
WBC: 9.3 10*3/uL (ref 4.0–10.5)
nRBC: 0 % (ref 0.0–0.2)

## 2023-09-16 LAB — COMPREHENSIVE METABOLIC PANEL
ALT: 126 U/L — ABNORMAL HIGH (ref 0–44)
AST: 148 U/L — ABNORMAL HIGH (ref 15–41)
Albumin: 1.9 g/dL — ABNORMAL LOW (ref 3.5–5.0)
Alkaline Phosphatase: 135 U/L — ABNORMAL HIGH (ref 38–126)
Anion gap: 7 (ref 5–15)
BUN: 15 mg/dL (ref 6–20)
CO2: 23 mmol/L (ref 22–32)
Calcium: 8 mg/dL — ABNORMAL LOW (ref 8.9–10.3)
Chloride: 110 mmol/L (ref 98–111)
Creatinine, Ser: 0.57 mg/dL (ref 0.44–1.00)
GFR, Estimated: >60 mL/min (ref >60)
Glucose, Bld: 174 mg/dL — ABNORMAL HIGH (ref 70–99)
Potassium: 4.1 mmol/L (ref 3.5–5.1)
Sodium: 140 mmol/L (ref 135–145)
Total Bilirubin: 0.7 mg/dL (ref <1.2)
Total Protein: 6.2 g/dL — ABNORMAL LOW (ref 6.5–8.1)

## 2023-09-16 LAB — RETICULOCYTES
RBC.: 2.9 MIL/uL — ABNORMAL LOW (ref 3.87–5.11)
Retic Ct Pct: 0.4 % (ref 0.4–3.1)

## 2023-09-16 LAB — GC/CHLAMYDIA PROBE AMP (~~LOC~~) NOT AT ARMC
Chlamydia: NEGATIVE
Comment: NEGATIVE
Comment: NORMAL
Neisseria Gonorrhea: NEGATIVE

## 2023-09-16 LAB — IRON AND TIBC
Iron: 66 ug/dL (ref 28–170)
Saturation Ratios: 31 % (ref 10.4–31.8)
TIBC: 211 ug/dL — ABNORMAL LOW (ref 250–450)
UIBC: 145 ug/dL

## 2023-09-16 LAB — VITAMIN B12: Vitamin B-12: 1309 pg/mL — ABNORMAL HIGH (ref 180–914)

## 2023-09-16 LAB — HOMOCYSTEINE: Homocysteine: 7.1 umol/L (ref 0.0–14.5)

## 2023-09-16 LAB — FERRITIN: Ferritin: 229 ng/mL (ref 11–307)

## 2023-09-16 LAB — HEMOGLOBIN AND HEMATOCRIT, BLOOD
HCT: 23 % — ABNORMAL LOW (ref 36.0–46.0)
Hemoglobin: 7.2 g/dL — ABNORMAL LOW (ref 12.0–15.0)

## 2023-09-16 LAB — GLUCOSE, BODY FLUID OTHER: Glucose, Body Fluid Other: 79 mg/dL

## 2023-09-16 LAB — PREPARE RBC (CROSSMATCH)

## 2023-09-16 LAB — FOLATE: Folate: 10.8 ng/mL (ref >5.9)

## 2023-09-16 MED ORDER — METHOCARBAMOL 500 MG PO TABS
500.0000 mg | ORAL_TABLET | Freq: Three times a day (TID) | ORAL | Status: AC | PRN
Start: 2023-09-16 — End: 2023-09-21
  Administered 2023-09-20: 500 mg via ORAL
  Filled 2023-09-16: qty 1

## 2023-09-16 MED ORDER — LOPERAMIDE HCL 2 MG PO CAPS: 2.0000 mg | ORAL_CAPSULE | ORAL | Status: AC | PRN

## 2023-09-16 MED ORDER — HYDROXYZINE HCL 25 MG PO TABS
25.0000 mg | ORAL_TABLET | Freq: Four times a day (QID) | ORAL | Status: AC | PRN
Start: 2023-09-16 — End: 2023-09-21
  Administered 2023-09-16 – 2023-09-20 (×9): 25 mg via ORAL
  Filled 2023-09-16 (×9): qty 1

## 2023-09-16 MED ORDER — MELATONIN 5 MG PO TABS
5.0000 mg | ORAL_TABLET | Freq: Once | ORAL | Status: AC
  Administered 2023-09-16: 5 mg via ORAL
  Filled 2023-09-16: qty 1

## 2023-09-16 MED ORDER — CLONIDINE HCL 0.1 MG PO TABS
0.1000 mg | ORAL_TABLET | ORAL | Status: AC
  Administered 2023-09-18 – 2023-09-19 (×3): 0.1 mg via ORAL
  Filled 2023-09-16 (×3): qty 1

## 2023-09-16 MED ORDER — BUPRENORPHINE HCL-NALOXONE HCL 8-2 MG SL SUBL
1.0000 | SUBLINGUAL_TABLET | Freq: Three times a day (TID) | SUBLINGUAL | Status: DC
  Administered 2023-09-16 – 2023-09-30 (×42): 1 via SUBLINGUAL
  Filled 2023-09-16 (×42): qty 1

## 2023-09-16 MED ORDER — NAPROXEN 250 MG PO TABS: 500.0000 mg | ORAL_TABLET | Freq: Two times a day (BID) | ORAL | Status: AC | PRN

## 2023-09-16 MED ORDER — SODIUM CHLORIDE 0.9% IV SOLUTION: Freq: Once | INTRAVENOUS | Status: AC

## 2023-09-16 MED ORDER — CLONIDINE HCL 0.1 MG PO TABS
0.1000 mg | ORAL_TABLET | Freq: Four times a day (QID) | ORAL | Status: AC
  Administered 2023-09-16 – 2023-09-17 (×8): 0.1 mg via ORAL
  Filled 2023-09-16 (×8): qty 1

## 2023-09-16 MED ORDER — NICOTINE POLACRILEX 2 MG MT GUM
2.0000 mg | CHEWING_GUM | OROMUCOSAL | Status: DC | PRN
  Administered 2023-09-17 – 2023-09-21 (×2): 2 mg via ORAL
  Filled 2023-09-16 (×4): qty 1

## 2023-09-16 MED ORDER — CLONIDINE HCL 0.1 MG PO TABS
0.1000 mg | ORAL_TABLET | Freq: Every day | ORAL | Status: AC
  Administered 2023-09-20 – 2023-09-21 (×2): 0.1 mg via ORAL
  Filled 2023-09-16 (×2): qty 1

## 2023-09-16 MED ORDER — DICYCLOMINE HCL 20 MG PO TABS: 20.0000 mg | ORAL_TABLET | Freq: Four times a day (QID) | ORAL | Status: AC | PRN

## 2023-09-16 NOTE — Plan of Care (Signed)

## 2023-09-16 NOTE — Progress Notes (Signed)
     Subjective:  Patient reports knee pain as mild.  Lying comfortably in bed this morning. Denies distal n/t. No concerns.  Objective:   VITALS:   Vitals:   09/16/23 0100 09/16/23 0200 09/16/23 0300 09/16/23 0430  BP: (!) 127/48 (!) 136/54 (!) 123/45   Pulse: 64 65 66   Resp: 16 20 17    Temp:    98.4 F (36.9 C)  TempSrc:    Oral  SpO2: 99% 99% 99%     Sensation intact distally Intact pulses distally Dorsiflexion/Plantar flexion intact Incision: dressing C/D/I Compartment soft Hemovac drain with minimal serosang drainage in cannister  Lab Results  Component Value Date   WBC 11.0 (H) 09/15/2023   HGB 7.0 (L) 09/15/2023   HCT 22.8 (L) 09/15/2023   MCV 74.9 (L) 09/15/2023   PLT 211 09/15/2023   BMET    Component Value Date/Time   NA 135 09/15/2023 0045   NA 140 01/09/2016 1538   K 3.5 09/15/2023 0045   CL 102 09/15/2023 0045   CO2 22 09/15/2023 0045   GLUCOSE 123 (H) 09/15/2023 0045   BUN 19 09/15/2023 0045   BUN 13 01/09/2016 1538   CREATININE 0.90 09/15/2023 0045   CALCIUM 7.7 (L) 09/15/2023 0045   GFRNONAA >60 09/15/2023 0045    Xray: none  Assessment/Plan: 1 Day Post-Op   Principal Problem:   Sepsis (HCC) Active Problems:   Abscess of upper extremity   Septic arthritis of knee, right (HCC)   Multiple lung nodules-secondary to septic emboli   Acute deep vein thrombosis (DVT) of axillary vein of right upper extremity (HCC)   Microcytic anemia   Transaminitis   IVDU (intravenous drug user)   Thyroid nodule   Tobacco dependence due to cigarettes   Opiate use  S/p R knee I&D 11/25  Post op recs: WB: WBAT Abx: Continue antibiotics per infectious disease, currently on linezolid and ancef Imaging: None Dressing: keep intact until follow up, document drain output every 12 hours.  Plan to remove drain once output less than 30 cc per shift likely after about 48 hours. DVT prophylaxis: Okay to resume prophylaxis anticoagulation postop day 1, would  not start full dose anticoagulation for at least 24 hours pending drain output. Follow up: 2 weeks after surgery for a wound check with Dr. Blanchie Dessert at [facility].  Address: [address], [place], [place] [address]  Office Phone: [phone]     Joen Laura 09/16/2023, 6:26 AM   Weber Cooks, MD  Contact information:   [phone] 7am-5pm epic message Dr. Blanchie Dessert, or call office for patient follow up: [phone] After hours and holidays please check [email or web address] for group call information for [facility]

## 2023-09-16 NOTE — Progress Notes (Signed)
[facility]    Date of Admission:  09/14/2023   Total days of antibiotics 3   ID: Laurie [name] is a 27 y.o. female with  disseminated group a strep infection, involving soft tissue infection,bacteremia and right knee septic arthritis concerning for endocarditis Principal Problem:   Sepsis (HCC) Active Problems:   Abscess of upper extremity   Septic arthritis of knee, right (HCC)   Multiple lung nodules-secondary to septic emboli   Acute deep vein thrombosis (DVT) of axillary vein of right upper extremity (HCC)   Microcytic anemia   Transaminitis   IVDU (intravenous drug user)   Thyroid nodule   Tobacco dependence due to cigarettes   Opiate use    Subjective: Afebrile. Starting to have less swelling to right forearm. Draining wound from right AC fossa. Drain in place for I x D of right knee yesterday, POD#1  Medications:   sodium chloride   Intravenous Once   buprenorphine-naloxone  1 tablet Sublingual TID   Chlorhexidine Gluconate Cloth  6 each Topical Daily   cloNIDine  0.1 mg Oral QID   Followed by   Melene Muller ON 09/18/2023] cloNIDine  0.1 mg Oral BH-qamhs   Followed by   Melene Muller ON 09/20/2023] cloNIDine  0.1 mg Oral QAC breakfast   ferrous sulfate  325 mg Oral Q breakfast   nicotine  21 mg Transdermal Daily   mouth rinse  15 mL Mouth Rinse 4 times per day   sodium chloride flush  3 mL Intravenous Q12H   sodium chloride flush  3 mL Intravenous Q12H    Objective: Vital signs in last 24 hours: Temp:  [98 F (36.7 C)-99.4 F (37.4 C)] 98.1 F (36.7 C) (11/26 1200) Pulse Rate:  [52-95] 60 (11/26 1500) Resp:  [6-20] 16 (11/26 1500) BP: (107-156)/(45-76) 124/62 (11/26 1500) SpO2:  [89 %-100 %] 100 % (11/26 1500) Physical Exam  Constitutional:  oriented to person, place, and time. appears well-developed and well-nourished. No distress.  HENT: [place]/AT, PERRLA, no scleral icterus Mouth/Throat: Oropharynx is clear and moist. No oropharyngeal  exudate.  ZOX:WRUEA arm, small ac open wound, purulence drainage VWU:JWJXB knee, wrapped, drain in place Lymphadenopathy: no cervical adenopathy. No axillary adenopathy Neurological: alert and oriented to person, place, and time.  Skin: Skin is warm and dry. No rash noted. No erythema.  Psychiatric: a normal mood and affect.  behavior is normal.    Lab Results Recent Labs    09/15/23 0045 09/15/23 1319 09/15/23 1844 09/16/23 1424  WBC 11.0*  --   --   --   HGB 7.7*   < > 7.0* 7.2*  HCT 23.6*   < > 22.8* 23.0*  NA 135  --   --   --   K 3.5  --   --   --   CL 102  --   --   --   CO2 22  --   --   --   BUN 19  --   --   --   CREATININE 0.90  --   --   --    < > = values in this interval not displayed.   Liver Panel Recent Labs    09/15/23 0045  PROT 7.0  ALBUMIN 2.5*  AST 34  ALT 127*  ALKPHOS 135*  BILITOT 1.7*   Sedimentation Rate No results for input(s): "ESRSEDRATE" in the last 72 hours. C-Reactive Protein No results for input(s): "CRP" in the last 72 hours.  Microbiology: Blood cx 11/24 group a strep Studies/Results: VAS [place] UPPER EXTREMITY VENOUS DUPLEX  Result Date: 09/16/2023 UPPER VENOUS STUDY  Patient Name:  Laurie [name]  Date of Exam:   09/15/2023 Medical Rec #: [id number]      Accession #:    [id number] Date of Birth: [birth year]      Patient Gender: F Patient Age:   71 years Exam Location:  [facility] Procedure:      VAS [place] UPPER EXTREMITY VENOUS DUPLEX Referring Phys: Eulah Pont SUNDIL --------------------------------------------------------------------------------  Indications: Filling defects w/CT Risk Factors: None identified. Limitations: Open wound. Comparison Study: 09/14/2024 - CT Extrem Up Entire Arm R W/CM                   IMPRESSION:                   1. Diffuse soft tissue thickening involving the right upper                   extremity with areas of nodular thickening in the skin. No                   abscess                   is seen.                    2. Filling defects in the subclavian, axillary, and basilic                   veins                   suspicious for deep venous thrombosis. Ultrasound is                   recommended for                   further evaluation.                   3. No acute osseous abnormality. Performing Technologist: Chanda Busing RVT  Examination Guidelines: A complete evaluation includes B-mode imaging, spectral Doppler, color Doppler, and power Doppler as needed of all accessible portions of each vessel. Bilateral testing is considered an integral part of a complete examination. Limited examinations for reoccurring indications may be performed as noted.  Right Findings: +----------+------------+---------+-----------+----------+-------+ RIGHT     CompressiblePhasicitySpontaneousPropertiesSummary +----------+------------+---------+-----------+----------+-------+ IJV           Full       Yes       Yes                      +----------+------------+---------+-----------+----------+-------+ Subclavian    None       No        No                Acute  +----------+------------+---------+-----------+----------+-------+ Axillary      None       No        No                Acute  +----------+------------+---------+-----------+----------+-------+ Brachial    Partial      No        No                Acute  +----------+------------+---------+-----------+----------+-------+ Radial        Full                                          +----------+------------+---------+-----------+----------+-------+  Ulnar         Full                                          +----------+------------+---------+-----------+----------+-------+ Cephalic      None                                   Acute  +----------+------------+---------+-----------+----------+-------+ Basilic       None                                   Acute   +----------+------------+---------+-----------+----------+-------+  Left Findings: +----------+------------+---------+-----------+----------+-------+ LEFT      CompressiblePhasicitySpontaneousPropertiesSummary +----------+------------+---------+-----------+----------+-------+ Subclavian    Full       Yes       Yes                      +----------+------------+---------+-----------+----------+-------+  Summary:  Right: Findings consistent with acute deep vein thrombosis involving the right subclavian vein, right axillary vein and right brachial veins. Findings consistent with acute superficial vein thrombosis involving the right basilic vein and right cephalic vein.  Left: No evidence of thrombosis in the subclavian.  *See table(s) above for measurements and observations.  Diagnosing physician: Carolynn Sayers Electronically signed by Carolynn Sayers on 09/16/2023 at 7:31:24 AM.    Final    ECHOCARDIOGRAM COMPLETE  Result Date: 09/15/2023    ECHOCARDIOGRAM REPORT   Patient Name:   MAHUM GRABEN Swigert Date of Exam: 09/15/2023 Medical Rec #:  [id number]     Height:       63.0 in Accession #:    [id number]    Weight:       120.0 lb Date of Birth:  [birth year]     BSA:          1.556 m Patient Age:    27 years      BP:           134/71 mmHg Patient Gender: F             HR:           69 bpm. Exam Location:  Inpatient Procedure: 2D Echo, Cardiac Doppler and Color Doppler Indications:    IVDU  History:        Patient has no prior history of Echocardiogram examinations.  Sonographer:    Vern Claude Referring Phys: [id number] SUBRINA SUNDIL IMPRESSIONS  1. Left ventricular ejection fraction, by estimation, is 55 to 60%. The left ventricle has normal function. The left ventricle has no regional wall motion abnormalities. Left ventricular diastolic parameters were normal.  2. Right ventricular systolic function is normal. The right ventricular size is normal.  3. The mitral valve is normal in structure. Trivial mitral  valve regurgitation. No evidence of mitral stenosis.  4. The aortic valve is normal in structure. Aortic valve regurgitation is not visualized. No aortic stenosis is present.  5. The inferior vena cava is normal in size with greater than 50% respiratory variability, suggesting right atrial pressure of 3 mmHg. FINDINGS  Left Ventricle: Left ventricular ejection fraction, by estimation, is 55 to 60%. The left ventricle has normal function. The left ventricle has no regional wall motion abnormalities. The left ventricular internal cavity size was normal  in size. There is  no left ventricular hypertrophy. Left ventricular diastolic parameters were normal. Right Ventricle: The right ventricular size is normal. No increase in right ventricular wall thickness. Right ventricular systolic function is normal. Left Atrium: Left atrial size was normal in size. Right Atrium: Right atrial size was normal in size. Pericardium: There is no evidence of pericardial effusion. Mitral Valve: The mitral valve is normal in structure. Trivial mitral valve regurgitation. No evidence of mitral valve stenosis. Tricuspid Valve: The tricuspid valve is normal in structure. Tricuspid valve regurgitation is trivial. No evidence of tricuspid stenosis. Aortic Valve: The aortic valve is normal in structure. Aortic valve regurgitation is not visualized. No aortic stenosis is present. Aortic valve mean gradient measures 3.0 mmHg. Aortic valve peak gradient measures 6.0 mmHg. Aortic valve area, by VTI measures 1.64 cm. Pulmonic Valve: The pulmonic valve was normal in structure. Pulmonic valve regurgitation is trivial. No evidence of pulmonic stenosis. Aorta: The aortic root is normal in size and structure. Venous: The inferior vena cava is normal in size with greater than 50% respiratory variability, suggesting right atrial pressure of 3 mmHg. IAS/Shunts: No atrial level shunt detected by color flow Doppler.  LEFT VENTRICLE PLAX 2D LVIDd:         4.80  cm      Diastology LVIDs:         3.00 cm      LV e' medial:    11.40 cm/s LV PW:         0.70 cm      LV E/e' medial:  8.2 LV IVS:        0.50 cm      LV e' lateral:   11.50 cm/s LVOT diam:     1.80 cm      LV E/e' lateral: 8.1 LV SV:         40 LV SV Index:   26 LVOT Area:     2.54 cm  LV Volumes (MOD) LV vol d, MOD A2C: 91.2 ml LV vol d, MOD A4C: 117.0 ml LV vol s, MOD A2C: 33.8 ml LV vol s, MOD A4C: 48.6 ml LV SV MOD A2C:     57.4 ml LV SV MOD A4C:     117.0 ml LV SV MOD BP:      60.5 ml RIGHT VENTRICLE             IVC RV Basal diam:  3.70 cm     IVC diam: 1.80 cm RV Mid diam:    3.20 cm RV S prime:     12.10 cm/s TAPSE (M-mode): 2.8 cm LEFT ATRIUM             Index        RIGHT ATRIUM           Index LA Vol (A2C):   33.4 ml 21.46 ml/m  RA Area:     10.90 cm LA Vol (A4C):   30.6 ml 19.66 ml/m  RA Volume:   26.40 ml  16.96 ml/m LA Biplane Vol: 33.6 ml 21.59 ml/m  AORTIC VALVE                    PULMONIC VALVE AV Area (Vmax):    1.77 cm     PV Vmax:          0.93 m/s AV Area (Vmean):   1.60 cm     PV Peak grad:     3.5 mmHg AV Area (  VTI):     1.64 cm     PR End Diast Vel: 1.74 msec AV Vmax:           122.00 cm/s AV Vmean:          81.600 cm/s AV VTI:            0.247 m AV Peak Grad:      6.0 mmHg AV Mean Grad:      3.0 mmHg LVOT Vmax:         85.00 cm/s LVOT Vmean:        51.200 cm/s LVOT VTI:          0.159 m LVOT/AV VTI ratio: 0.64  AORTA Ao Root diam: 2.40 cm Ao Asc diam:  2.30 cm MITRAL VALVE MV Area (PHT): 4.31 cm    SHUNTS MV Decel Time: 176 msec    Systemic VTI:  0.16 m MV E velocity: 93.50 cm/s  Systemic Diam: 1.80 cm MV A velocity: 44.30 cm/s MV E/A ratio:  2.11 Arvilla Meres MD Electronically signed by Arvilla Meres MD Signature Date/Time: 09/15/2023/2:42:46 PM    Final    CT FOREARM LEFT W CONTRAST  Result Date: 09/15/2023 CLINICAL DATA:  Soft tissue infection suspected, forearm, no prior imaging. History of polysubstance abuse. EXAM: CT OF THE UPPER LEFT EXTREMITY WITH CONTRAST  TECHNIQUE: Multidetector CT imaging of the left forearm was performed according to the standard protocol following intravenous contrast administration. RADIATION DOSE REDUCTION: This exam was performed according to the departmental dose-optimization program which includes automated exposure control, adjustment of the mA and/or kV according to patient size and/or use of iterative reconstruction technique. CONTRAST:  50mL OMNIPAQUE IOHEXOL 300 MG/ML  SOLN COMPARISON:  None Available. FINDINGS: Bones/Joint/Cartilage No evidence of acute fracture, dislocation or osteomyelitis. No significant elbow or wrist joint effusions demonstrated. The joint spaces appear preserved. Ligaments Suboptimally assessed by CT. Muscles and Tendons No focal intramuscular fluid collection or abnormal enhancement identified. No significant tenosynovitis or tendon sheath air is seen. Soft tissues Soft tissue swelling and scattered air bubbles within the dorsal and radial aspect of the distal forearm. There is a small amount of surrounding ill-defined fluid, but no drainable collections are identified. No evidence of foreign body. No acute vascular findings are identified. IMPRESSION: 1. Soft tissue swelling and scattered air bubbles within the dorsal and radial aspect of the distal forearm consistent with cellulitis. No drainable collections identified. 2. No CT evidence of osteomyelitis or septic arthritis. Electronically Signed   By: Carey Bullocks M.D.   On: 09/15/2023 11:24   US Abdomen Complete  Result Date: 09/15/2023 CLINICAL DATA:  27 year old female with chest pain and shortness of breath. EXAM: ABDOMEN ULTRASOUND COMPLETE COMPARISON:  CTA chest 0342 hours today. FINDINGS: Gallbladder: Partially contracted. This appears to account for the appearance of the gallbladder wall on image 3. No sludge or stones identified. No sonographic Murphy sign elicited. Common bile duct: Diameter: Could not be visualized. Liver: No focal lesion  identified. Within normal limits in parenchymal echogenicity. Portal vein is patent on color Doppler imaging with normal direction of blood flow towards the liver. IVC: No abnormality visualized. Pancreas: Visualized portion unremarkable. Spleen: Splenomegaly. Estimated splenic volume 695 mL (normal splenic volume range 83 - 412 mL). No discrete splenic lesion. Right Kidney: Length: 11.4 cm. Echogenicity within normal limits. No mass or hydronephrosis visualized. Left Kidney: Length: 12.6 cm. Echogenicity within normal limits. No mass or hydronephrosis visualized. Abdominal aorta: No aneurysm visualized. Other findings: No free  fluid identified. IMPRESSION: 1. Splenomegaly. 2. Partially contracted gallbladder. No evidence of cholelithiasis or acute cholecystitis. 3. Common bile duct could not be visualized but otherwise negative ultrasound appearance of the liver. Electronically Signed   By: Odessa Fleming M.D.   On: 09/15/2023 07:14   CT Extrem Up Entire Arm R W/CM  Result Date: 09/15/2023 CLINICAL DATA:  Soft tissue mass, forearm, concern for abscess. EXAM: CT OF THE UPPER RIGHT EXTREMITY WITH CONTRAST TECHNIQUE: Multidetector CT imaging of the upper right extremity was performed according to the standard protocol following intravenous contrast administration. RADIATION DOSE REDUCTION: This exam was performed according to the departmental dose-optimization program which includes automated exposure control, adjustment of the mA and/or kV according to patient size and/or use of iterative reconstruction technique. CONTRAST:  OMNIPAQUE IOHEXOL 350 MG/ML SOLN COMPARISON:  None Available. FINDINGS: Bones/Joint/Cartilage No acute fracture or dislocation is seen. No bony erosions or periosteal elevation is identified. No joint effusion is identified. Ligaments Suboptimally assessed by CT. Muscles and Tendons No intramuscular abscess or edema. Soft tissues Diffuse subcutaneous edema is noted in the right upper  extremity. No subcutaneous emphysema is seen. Focal subcutaneous nodular densities are noted in the dermis at the antecubital space, mid forearm, and wrist. No abscess is seen. Filling defects are present in the basilic, axillary, and subclavian veins. There is suboptimal opacification of the radial and ulnar veins. No other acute abnormality is seen. IMPRESSION: 1. Diffuse soft tissue thickening involving the right upper extremity with areas of nodular thickening in the skin. No abscess is seen. 2. Filling defects in the subclavian, axillary, and basilic veins suspicious for deep venous thrombosis. Ultrasound is recommended for further evaluation. 3. No acute osseous abnormality. Critical Value/emergent results were called by telephone at the time of interpretation on 09/15/2023 at 4:53 am to provider [facility] , who verbally acknowledged these results. Electronically Signed   By: Thornell Sartorius M.D.   On: 09/15/2023 04:55   CT Angio Chest PE W and/or Wo Contrast  Result Date: 09/15/2023 CLINICAL DATA:  Pulmonary embolism suspected, high probability. Chest pain and shortness of breath. Family history of factor 5 Leiden. EXAM: CT ANGIOGRAPHY CHEST WITH CONTRAST TECHNIQUE: Multidetector CT imaging of the chest was performed using the standard protocol during bolus administration of intravenous contrast. Multiplanar CT image reconstructions and MIPs were obtained to evaluate the vascular anatomy. RADIATION DOSE REDUCTION: This exam was performed according to the departmental dose-optimization program which includes automated exposure control, adjustment of the mA and/or kV according to patient size and/or use of iterative reconstruction technique. CONTRAST:  OMNIPAQUE IOHEXOL 350 MG/ML SOLN COMPARISON:  None Available. FINDINGS: Cardiovascular: The heart is normal in size and there is no pericardial effusion. The aorta and pulmonary trunk are normal in caliber. No evidence of pulmonary embolism is seen.  Mediastinum/Nodes: No mediastinal or hilar lymphadenopathy is seen. Prominent lymph nodes are noted in the right axilla, likely reactive. There is a hypodense nodule in the left lobe of the thyroid gland measuring 1.7 cm. The trachea and esophagus are within normal limits. Lungs/Pleura: Central bronchial wall thickening is present bilaterally. Nodular opacities are noted in the right upper lobe measuring 1 cm, axial image 55 and 1 cm axial image 82. There is a 9 mm nodular opacity in the right lower lobe, axial image 109. 5 mm nodule is present in the left lower lobe, axial image 82. No effusion or pneumothorax is seen. Upper Abdomen: Hypervascular focus is present in the anterior right  lobe of the liver, possible flash filling hemangioma. No acute abnormality is seen. Musculoskeletal: No acute osseous abnormality. Review of the MIP images confirms the above findings. IMPRESSION: 1. No evidence of pulmonary embolism. 2. Scattered nodular opacities in the lungs bilaterally measuring up to 1 cm, which may be infectious, inflammatory, or neoplastic. Non-contrast chest CT at 3-6 months is recommended. If the nodules are stable at time of repeat CT, then future CT at 18-24 months (from today's scan) is considered optional for low-risk patients, but is recommended for high-risk patients. This recommendation follows the consensus statement: Guidelines for Management of Incidental Pulmonary Nodules Detected on CT Images: From the Fleischner Society 2017; Radiology 2017; 284:228-243. 3. 1.7 cm left thyroid nodule. Nonemergent thyroid ultrasound is recommended. Electronically Signed   By: Thornell Sartorius M.D.   On: 09/15/2023 04:25   DG Chest Port 1 View  Result Date: 09/15/2023 CLINICAL DATA:  Possible sepsis. EXAM: PORTABLE CHEST 1 VIEW COMPARISON:  07/11/2014. FINDINGS: The heart size and mediastinal contours are within normal limits. No consolidation, effusion, or pneumothorax. No acute osseous abnormality. IMPRESSION:  No active disease. Electronically Signed   By: Thornell Sartorius M.D.   On: 09/15/2023 01:48   DG Knee Complete 4 Views Right  Result Date: 09/15/2023 CLINICAL DATA:  Questionable sepsis EXAM: RIGHT KNEE - COMPLETE 4+ VIEW COMPARISON:  None Available. FINDINGS: No evidence of fracture, dislocation, or joint effusion. No evidence of arthropathy or other focal bone abnormality. Soft tissues are unremarkable. IMPRESSION: Negative. Electronically Signed   By: Darliss Cheney M.D.   On: 09/15/2023 01:48   DG Elbow Complete Right  Result Date: 09/15/2023 CLINICAL DATA:  Possible sepsis. EXAM: RIGHT ELBOW - COMPLETE 3+ VIEW COMPARISON:  None Available. FINDINGS: There is no evidence of acute fracture, dislocation, or joint effusion. There is no evidence of arthropathy or other focal bone abnormality. Diffuse soft tissue swelling is seen. IMPRESSION: No acute osseous abnormality. Electronically Signed   By: Thornell Sartorius M.D.   On: 09/15/2023 01:48     Assessment/Plan: Group a strep bacteremia with pulm nodules, concern for endocarditis c/b SSTi of arms, and right knee septic arthritis  - continue on cefazolin. Can stop linezolid after 2 more days - repeat blood cx tomorrow - TTE appears negative, but would still like to pursue TEE  Right forearm soft tissue abscess = will continue to monitor as they evolve, Pacini drain. Continue with local wound care  Septic right knee = also likely due to group a strep. Continue on cefazolin 2 gm IV Q 8hr  acute deep vein thrombosis involving the right  subclavian vein, right axillary vein and right brachial veins. Findings  consistent with acute superficial vein thrombosis involving the right  basilic vein and right cephalic vein. = prothrombotic state in setting of bacteremia, and injection drug use. Contineus on anticoagulation  Opiate use disorder =defer to primary team for management, switch to suboxone.       [facility] for Infectious  Diseases Pager: [phone]  09/16/2023, 4:10 PM

## 2023-09-16 NOTE — Progress Notes (Signed)
    Discussed with patient over the phone regarding transesophageal echocardiogram, which has been requested by primary team.  We have reviewed risk versus benefit. The risks [esophageal damage, perforation (1:10,000 risk), bleeding, pharyngeal hematoma as well as other potential complications associated with conscious sedation including aspiration, arrhythmia, respiratory failure and death. Patient wished to have more time to think over this.  She will inform primary team, please reach out back to cardiology if she agrees.  She is tentatively scheduled on 09/22/2023 Monday, will cancel if she declines. Orders not done at this time.

## 2023-09-16 NOTE — Progress Notes (Signed)
PT Cancellation Note  Patient Details Name: AARVI STABLES MRN: [id number] DOB: 01/20/1996   Cancelled Treatment:    Reason Eval/Treat Not Completed: Patient declined, stated that she wanted to sleep. Agreeable  for therapy tomorrow. Blanchard Kelch PT Acute Rehabilitation Services Office [phone] Weekend pager-[phone]   Rada Hay 09/16/2023, 3:55 PM

## 2023-09-16 NOTE — Progress Notes (Signed)
Report called. Pt notified of transfer. Belongings collected. Pt stable at time of transfer.

## 2023-09-16 NOTE — Progress Notes (Signed)
CDate and time results received: 09/16/23 2110 (use smartphrase ".now" to insert current time)  Test: HgB  Critical Value: 6.7  Name of Provider Notified: Johann Capers  Orders Received? Or Actions Taken?: Actions Taken: new orders for TYPE / screen, consent, PRBS  pt denies in explanation from providers regarding receiving blood. Informed on call pt needs education.

## 2023-09-16 NOTE — Progress Notes (Signed)
Chaplain attempted to see Laurie [name], but she was resting at the time of visit.  We will attempt to follow up at a later time.

## 2023-09-16 NOTE — Progress Notes (Addendum)
[facility] PROGRESS NOTE   Laurie [name] NGE:[id number] DOB: [birth year] DOA: 09/14/2023  PCP: Pcp, No  Brief History: 27 y.o.f w/ IVDA and polysubstance use-including cocaine, amphetamine,fentanyl iv, factor V Leiden deficiency in family, chronic smoking/tobacco use, hepatitis C-untreated, PTSD, substance-induced mood disorder/chronic anxiety and depression elevated not feeling well, having central chest pain pressor/feeling tired, heavy., bilateral knee joint pain  x 5 days and multiple abscess in her arm.  Having subjective fever and not able to bear weight on right knee x 2 days.  In ED: Workup showed evidence of sepsis with leukocytosis, hypotension, tachycardia w/ anemia hemoglobin 7.7 baseline 7-8 RVP panel> negative. Labs> transaminitis with elevated AST 127, alkaline phosphatase 135 and elevated bilirubin 1.7 Elevated pro time and INR. UA amber appearance, hemoglobin small, protein 30 and trace leukocyte esterase, no bacteria. Negative pregnancy test. Troponin 5 wnl CXR, X-ray right kneE, X-ray right elbow>> no acute abnormalities CT extremity of the right arm>>Diffuse soft tissue thickening involving the right upper extremity with areas of nodular thickening in the skin. No abscess is seen. Filling defects in the subclavian, axillary, and basilic veins suspicious for deep venous thrombosis. CTA chest>> No P,E Scattered nodular opacities in the lungs bilaterally measuring up to 1 cm, which may be infectious, inflammatory, or neoplastic. Non-contrast chest CT at 3-6 months is recommended. 1.7 cm left thyroid nodule. Nonemergent thyroid ultrasound is recommended. S/P RT knee aspiration>> 60K WBC, Orthopedcs Dr Eulah Pont consulted for washout Blood culture sentIn the ED right knee joint aspiration has been done by Dr. Madilyn Hook and fluid has been sent to cell count, Gram stain and culture. Blood culture sent, heparin drip started patient admitted for right knee septic arthritis, bilateral  forearm abscess/cellulitis < right upper extremity DVT septic lung emboli transaminitis, placed on IV meropenem (history of allergy to Keflex and penicillin).    Subjective/Interval History: Patient complains of pain in the right knee area.  Also complains of pain in the right arm.  States that her withdrawal symptoms and not controlled.    Assessment/Plan:  Severe sepsis POA Right knee septic arthritis  Bilateral upper extremities abscess/cellulitis Septic lung emboli: Patient was seen by orthopedics.  Underwent right knee arthrotomy with irrigation and debridement of the knee joint and partial synovectomy.  Management per orthopedics. Infectious diseases also following. Patient noted to be on cefazolin and linezolid.  Antibiotics per ID. Transthoracic echocardiogram does not show any obvious endocarditis.  May need to undergo TEE. Cards contacted for TEE. Hemodynamically stable.   Acute DVT RUE: Noted on CT and [place]. Likely brought on by IV drug use. Patient was on heparin infusion.  This was held for surgery.  Per orthopedic note they recommended not starting full dose anticoagulation for at least 24 hours pending drain output.  Will need to await further input tomorrow to see if it is safe to resume anticoagulation at that time.   IVDA Polysubstance abuse Substance-induced mood disorder, chronic anxiety/depression: UDS positive for amphetamine, opiates, cocaine and THC.  Admits using IV fentanyl regularly.  Psychiatry was consulted.  Patient started on Suboxone.  Thyroid nodules: Nonemergent thyroid ultrasound is recommended at some point.  TSH normal at 1.36.   Chronic microcytic anemia: Hemoglobin is low but stable.  Repeat labs are pending from today.  May need to be transfused.  Will check anemia panel.  Apparently patient refused labs this morning. She was strongly urged to reconsider.   Transaminitis Hepatitis C history: Monitor LFTs. [place] negative for acute  findings.  DVT Prophylaxis: SCDs Code Status: Full code Family Communication: Discussed with patient.  No family at bedside Disposition Plan: To be determined     Medications: Scheduled:  sodium chloride   Intravenous Once   buprenorphine-naloxone  1 tablet Sublingual BID   Chlorhexidine Gluconate Cloth  6 each Topical Daily   ferrous sulfate  325 mg Oral Q breakfast   nicotine  21 mg Transdermal Daily   mouth rinse  15 mL Mouth Rinse 4 times per day   sodium chloride flush  3 mL Intravenous Q12H   sodium chloride flush  3 mL Intravenous Q12H   Continuous:   ceFAZolin (ANCEF) IV     lactated ringers     linezolid (ZYVOX) IV Stopped (09/15/23 2157)   WNU:[id number] **OR** acetaminophen, buprenorphine-naloxone, ketorolac, ondansetron **OR** ondansetron (ZOFRAN) IV, mouth rinse, senna-docusate, sodium chloride flush  Antibiotics: Anti-infectives (From admission, onward)    Start     Dose/Rate Route Frequency Ordered Stop   09/16/23 1400  ceFAZolin (ANCEF) IVPB 2g/100 mL premix        2 g 200 mL/hr over 30 Minutes Intravenous Every 8 hours 09/15/23 1850     09/16/23 0600  levofloxacin (LEVAQUIN) IVPB 500 mg        500 mg 100 mL/hr over 60 Minutes Intravenous On call to O.R. 09/15/23 1428 09/15/23 1553   09/16/23 0600  vancomycin (VANCOCIN) IVPB 1000 mg/200 mL premix  Status:  Discontinued        1,000 mg 200 mL/hr over 60 Minutes Intravenous On call to O.R. 09/15/23 1428 09/15/23 1431   09/15/23 1945  linezolid (ZYVOX) IVPB 600 mg        600 mg 300 mL/hr over 60 Minutes Intravenous Every 12 hours 09/15/23 1850     09/15/23 1800  vancomycin (VANCOREADY) IVPB 750 mg/150 mL  Status:  Discontinued        750 mg 150 mL/hr over 60 Minutes Intravenous Every 12 hours 09/15/23 0530 09/15/23 1850   09/15/23 1617  vancomycin (VANCOCIN) powder  Status:  Discontinued          As needed 09/15/23 1617 09/15/23 1806   09/15/23 1600  cefTRIAXone (ROCEPHIN) 2 g in sodium chloride 0.9 %  100 mL IVPB  Status:  Discontinued        2 g 200 mL/hr over 30 Minutes Intravenous Every 24 hours 09/15/23 1048 09/15/23 1850   09/15/23 1456  vancomycin (VANCOCIN) 1-5 GM/200ML-% IVPB       Note to Pharmacy: Minor, Anneita S: cabinet override      09/15/23 1456 09/15/23 1515   09/15/23 0900  meropenem (MERREM) 1 g in sodium chloride 0.9 % 100 mL IVPB  Status:  Discontinued        1 g 200 mL/hr over 30 Minutes Intravenous Every 8 hours 09/15/23 0534 09/15/23 1004   09/15/23 0530  vancomycin (VANCOCIN) IVPB 1000 mg/200 mL premix        1,000 mg 200 mL/hr over 60 Minutes Intravenous  Once 09/15/23 0528 09/15/23 0849   09/15/23 0045  meropenem (MERREM) 1 g in sodium chloride 0.9 % 100 mL IVPB        1 g 200 mL/hr over 30 Minutes Intravenous  Once 09/15/23 0032 09/15/23 0149       Objective:  Vital Signs  Vitals:   09/16/23 0430 09/16/23 0500 09/16/23 0600 09/16/23 0800  BP:  138/64 139/62 (!) 156/66  Pulse:  67 73 81  Resp:  16 17 14   Temp:  98.4 F (36.9 C)   98 F (36.7 C)  TempSrc: Oral   Oral  SpO2:  98% 99% 97%    Intake/Output Summary (Last 24 hours) at 09/16/2023 1610 Last data filed at 09/16/2023 0900 Gross per 24 hour  Intake 2294.42 ml  Output 600 ml  Net 1694.42 ml   There were no vitals filed for this visit.  General appearance: Awake alert.  In no distress Resp: Clear to auscultation bilaterally.  Normal effort Cardio: S1-S2 is normal regular.  No S3-S4.  No rubs murmurs or bruit GI: Abdomen is soft.  Nontender nondistended.  Bowel sounds are present normal.  No masses organomegaly Extremities: Swollen right arm is noted.  Right knee is covered in dressing.  Good pedal pulses   Lab Results:  Data Reviewed: I have personally reviewed following labs and reports of the imaging studies  CBC: Recent Labs  Lab 09/15/23 0045 09/15/23 1319 09/15/23 1844  WBC 11.0*  --   --   NEUTROABS 8.9*  --   --   HGB 7.7* 7.2* 7.0*  HCT 23.6* 23.2* 22.8*  MCV  74.9*  --   --   PLT 211  --   --     Basic Metabolic Panel: Recent Labs  Lab 09/15/23 0045  NA 135  K 3.5  CL 102  CO2 22  GLUCOSE 123*  BUN 19  CREATININE 0.90  CALCIUM 7.7*    GFR: Estimated Creatinine Clearance: 77 mL/min (by C-G formula based on SCr of 0.9 mg/dL).  Liver Function Tests: Recent Labs  Lab 09/15/23 0045  AST 34  ALT 127*  ALKPHOS 135*  BILITOT 1.7*  PROT 7.0  ALBUMIN 2.5*      Coagulation Profile: Recent Labs  Lab 09/15/23 0045  INR 1.4*     Thyroid Function Tests: Recent Labs    09/15/23 0812  TSH 1.366     Recent Results (from the past 240 hour(s))  Resp panel by RT-PCR (RSV, Flu A&B, Covid) Urine, Clean Catch     Status: None   Collection Time: 09/15/23 12:45 AM   Specimen: Urine, Clean Catch; Nasal Swab  Result Value Ref Range Status   SARS Coronavirus 2 by RT PCR NEGATIVE NEGATIVE Final    Comment: (NOTE) SARS-CoV-2 target nucleic acids are NOT DETECTED.  The SARS-CoV-2 RNA is generally detectable in upper respiratory specimens during the acute phase of infection. The lowest concentration of SARS-CoV-2 viral copies this assay can detect is 138 copies/mL. A negative result does not preclude SARS-Cov-2 infection and should not be used as the sole basis for treatment or other patient management decisions. A negative result may occur with  improper specimen collection/handling, submission of specimen other than nasopharyngeal swab, presence of viral mutation(s) within the areas targeted by this assay, and inadequate number of viral copies(<138 copies/mL). A negative result must be combined with clinical observations, patient history, and epidemiological information. The expected result is Negative.  Fact Sheet for Patients:  BloggerCourse.com  Fact Sheet for Healthcare Providers:  SeriousBroker.it  This test is no t yet approved or cleared by the [place] FDA and   has been authorized for detection and/or diagnosis of SARS-CoV-2 by FDA under an Emergency Use Authorization (EUA). This EUA will remain  in effect (meaning this test can be used) for the duration of the COVID-19 declaration under Section 564(b)(1) of the Act, 21 U.S.C.section 360bbb-3(b)(1), unless the authorization is terminated  or revoked sooner.       Influenza  A by PCR NEGATIVE NEGATIVE Final   Influenza B by PCR NEGATIVE NEGATIVE Final    Comment: (NOTE) The Xpert Xpress SARS-CoV-2/FLU/RSV plus assay is intended as an aid in the diagnosis of influenza from Nasopharyngeal swab specimens and should not be used as a sole basis for treatment. Nasal washings and aspirates are unacceptable for Xpert Xpress SARS-CoV-2/FLU/RSV testing.  Fact Sheet for Patients: BloggerCourse.com  Fact Sheet for Healthcare Providers: SeriousBroker.it  This test is not yet approved or cleared by the [place] FDA and has been authorized for detection and/or diagnosis of SARS-CoV-2 by FDA under an Emergency Use Authorization (EUA). This EUA will remain in effect (meaning this test can be used) for the duration of the COVID-19 declaration under Section 564(b)(1) of the Act, 21 U.S.C. section 360bbb-3(b)(1), unless the authorization is terminated or revoked.     Resp Syncytial Virus by PCR NEGATIVE NEGATIVE Final    Comment: (NOTE) Fact Sheet for Patients: BloggerCourse.com  Fact Sheet for Healthcare Providers: SeriousBroker.it  This test is not yet approved or cleared by the [place] FDA and has been authorized for detection and/or diagnosis of SARS-CoV-2 by FDA under an Emergency Use Authorization (EUA). This EUA will remain in effect (meaning this test can be used) for the duration of the COVID-19 declaration under Section 564(b)(1) of the Act, 21 U.S.C. section 360bbb-3(b)(1),  unless the authorization is terminated or revoked.  Performed at [facility], [address], [place], [place] [address]   Blood Culture (routine x 2)     Status: None (Preliminary result)   Collection Time: 09/15/23 12:45 AM   Specimen: BLOOD LEFT FOREARM  Result Value Ref Range Status   Specimen Description BLOOD LEFT FOREARM  Final   Special Requests   Final    BOTTLES DRAWN AEROBIC AND ANAEROBIC Blood Culture adequate volume   Culture  Setup Time   Final    GRAM POSITIVE COCCI IN CHAINS IN BOTH AEROBIC AND ANAEROBIC BOTTLES CRITICAL RESULT CALLED TO, READ BACK BY AND VERIFIED WITH: PHARMD C. SHADE [id number] @ 1810 FH Performed at [facility], [address] [place], [place] [address]    Culture GRAM POSITIVE COCCI  Final   Report Status PENDING  Incomplete  Blood Culture (routine x 2)     Status: None (Preliminary result)   Collection Time: 09/15/23  1:03 AM   Specimen: BLOOD LEFT ARM  Result Value Ref Range Status   Specimen Description BLOOD LEFT ARM  Final   Special Requests   Final    BOTTLES DRAWN AEROBIC AND ANAEROBIC Blood Culture adequate volume   Culture  Setup Time   Final    GRAM POSITIVE COCCI IN CHAINS IN BOTH AEROBIC AND ANAEROBIC BOTTLES CRITICAL RESULT CALLED TO, READ BACK BY AND VERIFIED WITH: PHARMD C. SHADE [id number] @ 1810 FH Performed at [facility], [address] [place], [place] [address]    Culture GRAM POSITIVE COCCI IN CHAINS  Final   Report Status PENDING  Incomplete  Blood Culture ID Panel (Reflexed)     Status: Abnormal   Collection Time: 09/15/23  1:03 AM  Result Value Ref Range Status   Enterococcus faecalis NOT DETECTED NOT DETECTED Final   Enterococcus Faecium NOT DETECTED NOT DETECTED Final   Listeria monocytogenes NOT DETECTED NOT DETECTED Final   Staphylococcus species NOT DETECTED NOT DETECTED Final   Staphylococcus aureus (BCID) NOT DETECTED NOT DETECTED Final   Staphylococcus epidermidis NOT DETECTED  NOT DETECTED  Final   Staphylococcus lugdunensis NOT DETECTED NOT DETECTED Final   Streptococcus species DETECTED (A) NOT DETECTED Final    Comment: CRITICAL RESULT CALLED TO, READ BACK BY AND VERIFIED WITH: PHARMD C. SHADE [id number] @ 1810 FH    Streptococcus agalactiae NOT DETECTED NOT DETECTED Final   Streptococcus pneumoniae NOT DETECTED NOT DETECTED Final   Streptococcus pyogenes DETECTED (A) NOT DETECTED Final    Comment: CRITICAL RESULT CALLED TO, READ BACK BY AND VERIFIED WITH: PHARMD C. SHADE [id number] @ 1810 FH    A.calcoaceticus-baumannii NOT DETECTED NOT DETECTED Final   Bacteroides fragilis NOT DETECTED NOT DETECTED Final   Enterobacterales NOT DETECTED NOT DETECTED Final   Enterobacter cloacae complex NOT DETECTED NOT DETECTED Final   Escherichia coli NOT DETECTED NOT DETECTED Final   Klebsiella aerogenes NOT DETECTED NOT DETECTED Final   Klebsiella oxytoca NOT DETECTED NOT DETECTED Final   Klebsiella pneumoniae NOT DETECTED NOT DETECTED Final   Proteus species NOT DETECTED NOT DETECTED Final   Salmonella species NOT DETECTED NOT DETECTED Final   Serratia marcescens NOT DETECTED NOT DETECTED Final   Haemophilus influenzae NOT DETECTED NOT DETECTED Final   Neisseria meningitidis NOT DETECTED NOT DETECTED Final   Pseudomonas aeruginosa NOT DETECTED NOT DETECTED Final   Stenotrophomonas maltophilia NOT DETECTED NOT DETECTED Final   Candida albicans NOT DETECTED NOT DETECTED Final   Candida auris NOT DETECTED NOT DETECTED Final   Candida glabrata NOT DETECTED NOT DETECTED Final   Candida krusei NOT DETECTED NOT DETECTED Final   Candida parapsilosis NOT DETECTED NOT DETECTED Final   Candida tropicalis NOT DETECTED NOT DETECTED Final   Cryptococcus neoformans/gattii NOT DETECTED NOT DETECTED Final    Comment: Performed at [facility], [address], [place], [place] [address]  Aerobic/Anaerobic Culture w Gram Stain (surgical/deep wound)     Status: None (Preliminary  result)   Collection Time: 09/15/23  1:43 AM   Specimen: BLOOD LEFT FOREARM; Abscess  Result Value Ref Range Status   Specimen Description   Final    BLOOD LEFT FOREARM Performed at [facility], [address], [place], [place] [address]    Special Requests   Final    NONE Performed at [facility], [address], [place], [place] [address]    Gram Stain   Final    FEW WBC PRESENT, PREDOMINANTLY MONONUCLEAR FEW GRAM POSITIVE COCCI IN PAIRS AND CHAINS FEW GRAM VARIABLE ROD    Culture   Final    CULTURE REINCUBATED FOR BETTER GROWTH Performed at [facility], [address] [place], [place] [address]    Report Status PENDING  Incomplete  Body fluid culture w Gram Stain     Status: None (Preliminary result)   Collection Time: 09/15/23  2:37 AM   Specimen: KNEE; Body Fluid  Result Value Ref Range Status   Specimen Description   Final    KNEE RIGHT Performed at [facility], [address] [place], [place] [address]    Special Requests   Final    NONE Performed at [facility], [address], [place], [place] [address]    Gram Stain   Final    FEW WBC PRESENT,BOTH PMN AND MONONUCLEAR RARE GRAM POSITIVE COCCI IN PAIRS Performed at [facility], [address] [place], [place] [address]    Culture PENDING  Incomplete   Report Status PENDING  Incomplete  Aerobic/Anaerobic Culture w Gram Stain (surgical/deep wound)  Status: None (Preliminary result)   Collection Time: 09/15/23  4:12 PM   Specimen: PATH Cytology Misc. fluid; Body Fluid  Result Value Ref Range Status   Specimen Description   Final    KNEE Performed at [facility], [address] [place], [place] [address]    Special Requests   Final    RIGHT Performed at [facility], [address], [place], [place] [address]    Gram Stain   Final    FEW WBC PRESENT,BOTH PMN AND  MONONUCLEAR NO ORGANISMS SEEN Performed at [facility], [address], [place], [place] [address]    Culture PENDING  Incomplete   Report Status PENDING  Incomplete      Radiology Studies: VAS [place] UPPER EXTREMITY VENOUS DUPLEX  Result Date: 09/16/2023 UPPER VENOUS STUDY  Patient Name:  Laurie [name]  Date of Exam:   09/15/2023 Medical Rec #: [id number]      Accession #:    [id number] Date of Birth: 09/08/1996      Patient Gender: F Patient Age:   64 years Exam Location:  [facility] Procedure:      VAS [place] UPPER EXTREMITY VENOUS DUPLEX Referring Phys: Eulah Pont SUNDIL --------------------------------------------------------------------------------  Indications: Filling defects w/CT Risk Factors: None identified. Limitations: Open wound. Comparison Study: 09/14/2024 - CT Extrem Up Entire Arm R W/CM                   IMPRESSION:                   1. Diffuse soft tissue thickening involving the right upper                   extremity with areas of nodular thickening in the skin. No                   abscess                   is seen.                   2. Filling defects in the subclavian, axillary, and basilic                   veins                   suspicious for deep venous thrombosis. Ultrasound is                   recommended for                   further evaluation.                   3. No acute osseous abnormality. Performing Technologist: Chanda Busing RVT  Examination Guidelines: A complete evaluation includes B-mode imaging, spectral Doppler, color Doppler, and power Doppler as needed of all accessible portions of each vessel. Bilateral testing is considered an integral part of a complete examination. Limited examinations for reoccurring indications may be performed as noted.  Right Findings: +----------+------------+---------+-----------+----------+-------+ RIGHT     CompressiblePhasicitySpontaneousPropertiesSummary  +----------+------------+---------+-----------+----------+-------+ IJV           Full       Yes       Yes                      +----------+------------+---------+-----------+----------+-------+ Subclavian    None  No        No                Acute  +----------+------------+---------+-----------+----------+-------+ Axillary      None       No        No                Acute  +----------+------------+---------+-----------+----------+-------+ Brachial    Partial      No        No                Acute  +----------+------------+---------+-----------+----------+-------+ Radial        Full                                          +----------+------------+---------+-----------+----------+-------+ Ulnar         Full                                          +----------+------------+---------+-----------+----------+-------+ Cephalic      None                                   Acute  +----------+------------+---------+-----------+----------+-------+ Basilic       None                                   Acute  +----------+------------+---------+-----------+----------+-------+  Left Findings: +----------+------------+---------+-----------+----------+-------+ LEFT      CompressiblePhasicitySpontaneousPropertiesSummary +----------+------------+---------+-----------+----------+-------+ Subclavian    Full       Yes       Yes                      +----------+------------+---------+-----------+----------+-------+  Summary:  Right: Findings consistent with acute deep vein thrombosis involving the right subclavian vein, right axillary vein and right brachial veins. Findings consistent with acute superficial vein thrombosis involving the right basilic vein and right cephalic vein.  Left: No evidence of thrombosis in the subclavian.  *See table(s) above for measurements and observations.  Diagnosing physician: Carolynn Sayers Electronically signed by Carolynn Sayers on  09/16/2023 at 7:31:24 AM.    Final    ECHOCARDIOGRAM COMPLETE  Result Date: 09/15/2023    ECHOCARDIOGRAM REPORT   Patient Name:   Laurie [name] Date of Exam: 09/15/2023 Medical Rec #:  [id number]     Height:       63.0 in Accession #:    [id number]    Weight:       120.0 lb Date of Birth:  May 22, 1996     BSA:          1.556 m Patient Age:    27 years      BP:           134/71 mmHg Patient Gender: F             HR:           69 bpm. Exam Location:  Inpatient Procedure: 2D Echo, Cardiac Doppler and Color Doppler Indications:    IVDU  History:        Patient has no prior history of Echocardiogram examinations.  Sonographer:  Vern Claude Referring Phys: [id number] SUBRINA SUNDIL IMPRESSIONS  1. Left ventricular ejection fraction, by estimation, is 55 to 60%. The left ventricle has normal function. The left ventricle has no regional wall motion abnormalities. Left ventricular diastolic parameters were normal.  2. Right ventricular systolic function is normal. The right ventricular size is normal.  3. The mitral valve is normal in structure. Trivial mitral valve regurgitation. No evidence of mitral stenosis.  4. The aortic valve is normal in structure. Aortic valve regurgitation is not visualized. No aortic stenosis is present.  5. The inferior vena cava is normal in size with greater than 50% respiratory variability, suggesting right atrial pressure of 3 mmHg. FINDINGS  Left Ventricle: Left ventricular ejection fraction, by estimation, is 55 to 60%. The left ventricle has normal function. The left ventricle has no regional wall motion abnormalities. The left ventricular internal cavity size was normal in size. There is  no left ventricular hypertrophy. Left ventricular diastolic parameters were normal. Right Ventricle: The right ventricular size is normal. No increase in right ventricular wall thickness. Right ventricular systolic function is normal. Left Atrium: Left atrial size was normal in size. Right Atrium:  Right atrial size was normal in size. Pericardium: There is no evidence of pericardial effusion. Mitral Valve: The mitral valve is normal in structure. Trivial mitral valve regurgitation. No evidence of mitral valve stenosis. Tricuspid Valve: The tricuspid valve is normal in structure. Tricuspid valve regurgitation is trivial. No evidence of tricuspid stenosis. Aortic Valve: The aortic valve is normal in structure. Aortic valve regurgitation is not visualized. No aortic stenosis is present. Aortic valve mean gradient measures 3.0 mmHg. Aortic valve peak gradient measures 6.0 mmHg. Aortic valve area, by VTI measures 1.64 cm. Pulmonic Valve: The pulmonic valve was normal in structure. Pulmonic valve regurgitation is trivial. No evidence of pulmonic stenosis. Aorta: The aortic root is normal in size and structure. Venous: The inferior vena cava is normal in size with greater than 50% respiratory variability, suggesting right atrial pressure of 3 mmHg. IAS/Shunts: No atrial level shunt detected by color flow Doppler.  LEFT VENTRICLE PLAX 2D LVIDd:         4.80 cm      Diastology LVIDs:         3.00 cm      LV e' medial:    11.40 cm/s LV PW:         0.70 cm      LV E/e' medial:  8.2 LV IVS:        0.50 cm      LV e' lateral:   11.50 cm/s LVOT diam:     1.80 cm      LV E/e' lateral: 8.1 LV SV:         40 LV SV Index:   26 LVOT Area:     2.54 cm  LV Volumes (MOD) LV vol d, MOD A2C: 91.2 ml LV vol d, MOD A4C: 117.0 ml LV vol s, MOD A2C: 33.8 ml LV vol s, MOD A4C: 48.6 ml LV SV MOD A2C:     57.4 ml LV SV MOD A4C:     117.0 ml LV SV MOD BP:      60.5 ml RIGHT VENTRICLE             IVC RV Basal diam:  3.70 cm     IVC diam: 1.80 cm RV Mid diam:    3.20 cm RV S prime:     12.10 cm/s TAPSE (  M-mode): 2.8 cm LEFT ATRIUM             Index        RIGHT ATRIUM           Index LA Vol (A2C):   33.4 ml 21.46 ml/m  RA Area:     10.90 cm LA Vol (A4C):   30.6 ml 19.66 ml/m  RA Volume:   26.40 ml  16.96 ml/m LA Biplane Vol: 33.6 ml  21.59 ml/m  AORTIC VALVE                    PULMONIC VALVE AV Area (Vmax):    1.77 cm     PV Vmax:          0.93 m/s AV Area (Vmean):   1.60 cm     PV Peak grad:     3.5 mmHg AV Area (VTI):     1.64 cm     PR End Diast Vel: 1.74 msec AV Vmax:           122.00 cm/s AV Vmean:          81.600 cm/s AV VTI:            0.247 m AV Peak Grad:      6.0 mmHg AV Mean Grad:      3.0 mmHg LVOT Vmax:         85.00 cm/s LVOT Vmean:        51.200 cm/s LVOT VTI:          0.159 m LVOT/AV VTI ratio: 0.64  AORTA Ao Root diam: 2.40 cm Ao Asc diam:  2.30 cm MITRAL VALVE MV Area (PHT): 4.31 cm    SHUNTS MV Decel Time: 176 msec    Systemic VTI:  0.16 m MV E velocity: 93.50 cm/s  Systemic Diam: 1.80 cm MV A velocity: 44.30 cm/s MV E/A ratio:  2.11 Arvilla Meres MD Electronically signed by Arvilla Meres MD Signature Date/Time: 09/15/2023/2:42:46 PM    Final    CT FOREARM LEFT W CONTRAST  Result Date: 09/15/2023 CLINICAL DATA:  Soft tissue infection suspected, forearm, no prior imaging. History of polysubstance abuse. EXAM: CT OF THE UPPER LEFT EXTREMITY WITH CONTRAST TECHNIQUE: Multidetector CT imaging of the left forearm was performed according to the standard protocol following intravenous contrast administration. RADIATION DOSE REDUCTION: This exam was performed according to the departmental dose-optimization program which includes automated exposure control, adjustment of the mA and/or kV according to patient size and/or use of iterative reconstruction technique. CONTRAST:  50mL OMNIPAQUE IOHEXOL 300 MG/ML  SOLN COMPARISON:  None Available. FINDINGS: Bones/Joint/Cartilage No evidence of acute fracture, dislocation or osteomyelitis. No significant elbow or wrist joint effusions demonstrated. The joint spaces appear preserved. Ligaments Suboptimally assessed by CT. Muscles and Tendons No focal intramuscular fluid collection or abnormal enhancement identified. No significant tenosynovitis or tendon sheath air is seen. Soft  tissues Soft tissue swelling and scattered air bubbles within the dorsal and radial aspect of the distal forearm. There is a small amount of surrounding ill-defined fluid, but no drainable collections are identified. No evidence of foreign body. No acute vascular findings are identified. IMPRESSION: 1. Soft tissue swelling and scattered air bubbles within the dorsal and radial aspect of the distal forearm consistent with cellulitis. No drainable collections identified. 2. No CT evidence of osteomyelitis or septic arthritis. Electronically Signed   By: Carey Bullocks M.D.   On: 09/15/2023 11:24   US Abdomen Complete  Result  Date: 09/15/2023 CLINICAL DATA:  27 year old female with chest pain and shortness of breath. EXAM: ABDOMEN ULTRASOUND COMPLETE COMPARISON:  CTA chest 0342 hours today. FINDINGS: Gallbladder: Partially contracted. This appears to account for the appearance of the gallbladder wall on image 3. No sludge or stones identified. No sonographic Murphy sign elicited. Common bile duct: Diameter: Could not be visualized. Liver: No focal lesion identified. Within normal limits in parenchymal echogenicity. Portal vein is patent on color Doppler imaging with normal direction of blood flow towards the liver. IVC: No abnormality visualized. Pancreas: Visualized portion unremarkable. Spleen: Splenomegaly. Estimated splenic volume 695 mL (normal splenic volume range 83 - 412 mL). No discrete splenic lesion. Right Kidney: Length: 11.4 cm. Echogenicity within normal limits. No mass or hydronephrosis visualized. Left Kidney: Length: 12.6 cm. Echogenicity within normal limits. No mass or hydronephrosis visualized. Abdominal aorta: No aneurysm visualized. Other findings: No free fluid identified. IMPRESSION: 1. Splenomegaly. 2. Partially contracted gallbladder. No evidence of cholelithiasis or acute cholecystitis. 3. Common bile duct could not be visualized but otherwise negative ultrasound appearance of the  liver. Electronically Signed   By: Odessa Fleming M.D.   On: 09/15/2023 07:14   CT Extrem Up Entire Arm R W/CM  Result Date: 09/15/2023 CLINICAL DATA:  Soft tissue mass, forearm, concern for abscess. EXAM: CT OF THE UPPER RIGHT EXTREMITY WITH CONTRAST TECHNIQUE: Multidetector CT imaging of the upper right extremity was performed according to the standard protocol following intravenous contrast administration. RADIATION DOSE REDUCTION: This exam was performed according to the departmental dose-optimization program which includes automated exposure control, adjustment of the mA and/or kV according to patient size and/or use of iterative reconstruction technique. CONTRAST:  OMNIPAQUE IOHEXOL 350 MG/ML SOLN COMPARISON:  None Available. FINDINGS: Bones/Joint/Cartilage No acute fracture or dislocation is seen. No bony erosions or periosteal elevation is identified. No joint effusion is identified. Ligaments Suboptimally assessed by CT. Muscles and Tendons No intramuscular abscess or edema. Soft tissues Diffuse subcutaneous edema is noted in the right upper extremity. No subcutaneous emphysema is seen. Focal subcutaneous nodular densities are noted in the dermis at the antecubital space, mid forearm, and wrist. No abscess is seen. Filling defects are present in the basilic, axillary, and subclavian veins. There is suboptimal opacification of the radial and ulnar veins. No other acute abnormality is seen. IMPRESSION: 1. Diffuse soft tissue thickening involving the right upper extremity with areas of nodular thickening in the skin. No abscess is seen. 2. Filling defects in the subclavian, axillary, and basilic veins suspicious for deep venous thrombosis. Ultrasound is recommended for further evaluation. 3. No acute osseous abnormality. Critical Value/emergent results were called by telephone at the time of interpretation on 09/15/2023 at 4:53 am to provider [facility] , who verbally acknowledged these results.  Electronically Signed   By: Thornell Sartorius M.D.   On: 09/15/2023 04:55   CT Angio Chest PE W and/or Wo Contrast  Result Date: 09/15/2023 CLINICAL DATA:  Pulmonary embolism suspected, high probability. Chest pain and shortness of breath. Family history of factor 5 Leiden. EXAM: CT ANGIOGRAPHY CHEST WITH CONTRAST TECHNIQUE: Multidetector CT imaging of the chest was performed using the standard protocol during bolus administration of intravenous contrast. Multiplanar CT image reconstructions and MIPs were obtained to evaluate the vascular anatomy. RADIATION DOSE REDUCTION: This exam was performed according to the departmental dose-optimization program which includes automated exposure control, adjustment of the mA and/or kV according to patient size and/or use of iterative reconstruction technique. CONTRAST:  OMNIPAQUE IOHEXOL  350 MG/ML SOLN COMPARISON:  None Available. FINDINGS: Cardiovascular: The heart is normal in size and there is no pericardial effusion. The aorta and pulmonary trunk are normal in caliber. No evidence of pulmonary embolism is seen. Mediastinum/Nodes: No mediastinal or hilar lymphadenopathy is seen. Prominent lymph nodes are noted in the right axilla, likely reactive. There is a hypodense nodule in the left lobe of the thyroid gland measuring 1.7 cm. The trachea and esophagus are within normal limits. Lungs/Pleura: Central bronchial wall thickening is present bilaterally. Nodular opacities are noted in the right upper lobe measuring 1 cm, axial image 55 and 1 cm axial image 82. There is a 9 mm nodular opacity in the right lower lobe, axial image 109. 5 mm nodule is present in the left lower lobe, axial image 82. No effusion or pneumothorax is seen. Upper Abdomen: Hypervascular focus is present in the anterior right lobe of the liver, possible flash filling hemangioma. No acute abnormality is seen. Musculoskeletal: No acute osseous abnormality. Review of the MIP images confirms the above  findings. IMPRESSION: 1. No evidence of pulmonary embolism. 2. Scattered nodular opacities in the lungs bilaterally measuring up to 1 cm, which may be infectious, inflammatory, or neoplastic. Non-contrast chest CT at 3-6 months is recommended. If the nodules are stable at time of repeat CT, then future CT at 18-24 months (from today's scan) is considered optional for low-risk patients, but is recommended for high-risk patients. This recommendation follows the consensus statement: Guidelines for Management of Incidental Pulmonary Nodules Detected on CT Images: From the Fleischner Society 2017; Radiology 2017; 284:228-243. 3. 1.7 cm left thyroid nodule. Nonemergent thyroid ultrasound is recommended. Electronically Signed   By: Thornell Sartorius M.D.   On: 09/15/2023 04:25   DG Chest Port 1 View  Result Date: 09/15/2023 CLINICAL DATA:  Possible sepsis. EXAM: PORTABLE CHEST 1 VIEW COMPARISON:  07/11/2014. FINDINGS: The heart size and mediastinal contours are within normal limits. No consolidation, effusion, or pneumothorax. No acute osseous abnormality. IMPRESSION: No active disease. Electronically Signed   By: Thornell Sartorius M.D.   On: 09/15/2023 01:48   DG Knee Complete 4 Views Right  Result Date: 09/15/2023 CLINICAL DATA:  Questionable sepsis EXAM: RIGHT KNEE - COMPLETE 4+ VIEW COMPARISON:  None Available. FINDINGS: No evidence of fracture, dislocation, or joint effusion. No evidence of arthropathy or other focal bone abnormality. Soft tissues are unremarkable. IMPRESSION: Negative. Electronically Signed   By: Darliss Cheney M.D.   On: 09/15/2023 01:48   DG Elbow Complete Right  Result Date: 09/15/2023 CLINICAL DATA:  Possible sepsis. EXAM: RIGHT ELBOW - COMPLETE 3+ VIEW COMPARISON:  None Available. FINDINGS: There is no evidence of acute fracture, dislocation, or joint effusion. There is no evidence of arthropathy or other focal bone abnormality. Diffuse soft tissue swelling is seen. IMPRESSION: No acute  osseous abnormality. Electronically Signed   By: Thornell Sartorius M.D.   On: 09/15/2023 01:48       LOS: 1 day   Clearance Chenault  [facility] Pager on [email or web address]  09/16/2023, 9:39 AM

## 2023-09-16 NOTE — Consult Note (Signed)
[facility] Face-to-Face Psychiatry Consult   Reason for Consult: Suboxone induction/opiate use disorder Referring Physician: Dr. Jonathon Bellows Patient Identification: Laurie [name] MRN:  [id number] Principal Diagnosis: Sepsis [facility]) Diagnosis:  Principal Problem:   Sepsis (HCC) Active Problems:   Abscess of upper extremity   Septic arthritis of knee, right (HCC)   Multiple lung nodules-secondary to septic emboli   Acute deep vein thrombosis (DVT) of axillary vein of right upper extremity (HCC)   Microcytic anemia   Transaminitis   IVDU (intravenous drug user)   Thyroid nodule   Tobacco dependence due to cigarettes   Opiate use   Total Time spent with patient: 30 minutes  Subjective:   Laurie [name] is a 27 y.o. female patient admitted with cellulitis, septic arthritis, and septic emboli (CT confirmed).Patient has history of IVDU.  She has a 49 year old son who is with her mother. She is unemployed at this time. She report psychiatric history of depression and anxiety that was previously managed with Wellbutrin. Patient reports using about 1gr of fentanyl up to two times a day, when unable to get fentanyl she uses heroin. She reports intermittent episodes of methamphetamine use and no knowing intention of xylazine use. Psych consulted for assistance with opiate use disorder and suboxone induction.   Patient was given suboxone and has received a total of 8mg  + 6mg  prn doses since the reassessment while in the hospital. While in the hospital patient initially reported having "really bad "withdrawal symptoms and she endorsed yawning, shakes, irritability, sweating, aches, and piloerection. SHe reports sleeping ok and has a fair appetite. She is observed eating fresh fruit as her breakfast with juice. She is able to advocate for herself by saying I would like to have my suboxone increased, and may I be started on nicotine. She is able to show some improved insight as she is able to request clonidine, with  correlation that her blood pressure is no longer "low and I can have it now. I think that will help me a lot and reduce the number of prns I am getting." She endorses some depressive rating it 9/10 with 10 being the worse on a Likert scale and rates her anxiety 10/10 with 10 being the worse on a Likert scale. She remains motivated for treatment and understand the detox cycle, yet endorses some hopelessness and guilty associated with relapse. She denies si/hi/avh at this time.    HPI:  Laurie [name] is a 27 y.o. female who presents to the Emergency Department complaining of sick.  She presents to the emergency for feeling sick.  She states that she is on her arms 3 weeks ago and they started popping over the last few days.  5 days ago she started feeling sick and unwell with central chest pain and feeling tight and heavy with difficulty breathing.  She does not have a cough.  She reports 2 days of subjective fevers.  She reports bilateral knee pain over the last day but now she is unable to bear weight on her right knee secondary to severe pain.   Past Psychiatric History: Depression and anxiety. Previous medications included Wellbutrin. SHe reports ongoing use of illicit substances for the past 10 years. She denies history of NSSIB, suicide attempts, suicidal ideations and or suicidal thoughts. She recently incarcerated in July 2024. She endorses history of illicit substances and 1ppd cigarette since age 19.   Risk to [place]:   Denies Risk to Others:   Denies Prior Inpatient Therapy:  Denies admission. Reports history of inpatient rehab admission in 2018 for methamphetamines.  Prior Outpatient Therapy:  Denies current has been to [facility] and [facility] stop .   Past Medical History:  Past Medical History:  Diagnosis Date   Anxiety    Asthma    "grew out"   Chlamydia    Depression    doing really good   Gestational diabetes    with first two pregnancies   Hepatitis C 03/2015   History of heroin abuse  (HCC)    Homelessness    Infection    UTI   Polysubstance abuse (HCC) 05/28/2018   UDS positive for cocaine, MJ, meth, opioids   Type A blood, Rh negative     Past Surgical History:  Procedure Laterality Date   CESAREAN SECTION N/A 01/11/2016   Procedure: CESAREAN SECTION;  Surgeon: Brock Bad, MD;  Location: [facility] ORS;  Service: Obstetrics;  Laterality: N/A;   CESAREAN SECTION N/A 12/21/2016   Procedure: REPEAT CESAREAN SECTION;  Surgeon: Adam Phenix, MD;  Location: [facility] SU[facility];  Service: Obstetrics;  Laterality: N/A;   CESAREAN SECTION N/A 06/05/2018   Procedure: REPEAT CESAREAN SECTION;  Surgeon: [name], MD;  Location: [facility] BIRTHING SUITES;  Service: Obstetrics;  Laterality: N/A;   CESAREAN SECTION WITH BILATERAL TUBAL LIGATION Bilateral 03/08/2020   Procedure: CESAREAN SECTION WITH BILATERAL TUBAL LIGATION;  Surgeon: Kathrynn Running, MD;  Location: [facility] LD ORS;  Service: Obstetrics;  Laterality: Bilateral;   IRRIGATION AND DEBRIDEMENT KNEE Right 09/15/2023   Procedure: IRRIGATION AND DEBRIDEMENT KNEE;  Surgeon: Joen Laura, MD;  Location: [facility] ORS;  Service: Orthopedics;  Laterality: Right;   Family History:  Family History  Problem Relation Age of Onset   Diabetes Mother    Pulmonary embolism Mother    Stroke Mother    Cancer Paternal Grandmother        liver   Epilepsy Brother    Family Psychiatric  History: Denies Social History:  Social History   Substance and Sexual Activity  Alcohol Use Not Currently   Comment: occasionally     Social History   Substance and Sexual Activity  Drug Use Not Currently   Types: Marijuana, Heroin, Other-see comments   Comment: 05/28/18 +UDS for meth, cocaine, opioids, MJ;  heroin 3 years ago    Social History   Socioeconomic History   Marital status: Single    Spouse name: Not on file   Number of children: 2   Years of education: Not on file   Highest education level: Not on file  Occupational History    Not on file  Tobacco Use   Smoking status: Some Days    Current packs/day: 0.25    Types: Cigarettes   Smokeless tobacco: Never   Tobacco comments:    4 cigs per day  Vaping Use   Vaping status: Never Used  Substance and Sexual Activity   Alcohol use: Not Currently    Comment: occasionally   Drug use: Not Currently    Types: Marijuana, Heroin, Other-see comments    Comment: 05/28/18 +UDS for meth, cocaine, opioids, MJ;  heroin 3 years ago   Sexual activity: Yes    Birth control/protection: None  Other Topics Concern   Not on file  Social History Narrative   Not on file   Social Determinants of Health   Financial Resource Strain: Low Risk  (06/04/2018)   Overall Financial Resource Strain (CARDIA)    Difficulty of Paying Living  Expenses: Not hard at all  Food Insecurity: Food Insecurity Present (09/15/2023)   Hunger Vital Sign    Worried About Running Out of Food in the Last Year: Sometimes true    Ran Out of Food in the Last Year: Sometimes true  Transportation Needs: Unmet Transportation Needs (09/15/2023)   PRAPARE - Administrator, Civil Service (Medical): Yes    Lack of Transportation (Non-Medical): Yes  Physical Activity: Not on file  Stress: Not on file  Social Connections: Unknown (03/05/2022)   [facility]   Social Network    Social Network: Not on file   Additional Social History:    Allergies:   Allergies  Allergen Reactions   Keflex [Cephalexin] Hives   Penicillins Other (See Comments)    Results thru an allergy skin test Has patient had a PCN reaction causing immediate rash, facial/tongue/throat swelling, SOB or lightheadedness with hypotension: No Has patient had a PCN reaction causing severe rash involving mucus membranes or skin necrosis: No Has patient had a PCN reaction that required hospitalization No Has patient had a PCN reaction occurring within the last 10 years: No If all of the above answers are "NO",  then may proceed with Cephalosporin use.      Labs:  Results for orders placed or performed during the hospital encounter of 09/14/23 (from the past 48 hour(s))  Resp panel by RT-PCR (RSV, Flu A&B, Covid) Urine, Clean Catch     Status: None   Collection Time: 09/15/23 12:45 AM   Specimen: Urine, Clean Catch; Nasal Swab  Result Value Ref Range   SARS Coronavirus 2 by RT PCR NEGATIVE NEGATIVE    Comment: (NOTE) SARS-CoV-2 target nucleic acids are NOT DETECTED.  The SARS-CoV-2 RNA is generally detectable in upper respiratory specimens during the acute phase of infection. The lowest concentration of SARS-CoV-2 viral copies this assay can detect is 138 copies/mL. A negative result does not preclude SARS-Cov-2 infection and should not be used as the sole basis for treatment or other patient management decisions. A negative result may occur with  improper specimen collection/handling, submission of specimen other than nasopharyngeal swab, presence of viral mutation(s) within the areas targeted by this assay, and inadequate number of viral copies(<138 copies/mL). A negative result must be combined with clinical observations, patient history, and epidemiological information. The expected result is Negative.  Fact Sheet for Patients:  BloggerCourse.com  Fact Sheet for Healthcare Providers:  SeriousBroker.it  This test is no t yet approved or cleared by the [place] FDA and  has been authorized for detection and/or diagnosis of SARS-CoV-2 by FDA under an Emergency Use Authorization (EUA). This EUA will remain  in effect (meaning this test can be used) for the duration of the COVID-19 declaration under Section 564(b)(1) of the Act, 21 U.S.C.section 360bbb-3(b)(1), unless the authorization is terminated  or revoked sooner.       Influenza A by PCR NEGATIVE NEGATIVE   Influenza B by PCR NEGATIVE NEGATIVE    Comment: (NOTE) The  Xpert Xpress SARS-CoV-2/FLU/RSV plus assay is intended as an aid in the diagnosis of influenza from Nasopharyngeal swab specimens and should not be used as a sole basis for treatment. Nasal washings and aspirates are unacceptable for Xpert Xpress SARS-CoV-2/FLU/RSV testing.  Fact Sheet for Patients: BloggerCourse.com  Fact Sheet for Healthcare Providers: SeriousBroker.it  This test is not yet approved or cleared by the [place] FDA and has been authorized for detection and/or diagnosis of SARS-CoV-2  by FDA under an Emergency Use Authorization (EUA). This EUA will remain in effect (meaning this test can be used) for the duration of the COVID-19 declaration under Section 564(b)(1) of the Act, 21 U.S.C. section 360bbb-3(b)(1), unless the authorization is terminated or revoked.     Resp Syncytial Virus by PCR NEGATIVE NEGATIVE    Comment: (NOTE) Fact Sheet for Patients: BloggerCourse.com  Fact Sheet for Healthcare Providers: SeriousBroker.it  This test is not yet approved or cleared by the [place] FDA and has been authorized for detection and/or diagnosis of SARS-CoV-2 by FDA under an Emergency Use Authorization (EUA). This EUA will remain in effect (meaning this test can be used) for the duration of the COVID-19 declaration under Section 564(b)(1) of the Act, 21 U.S.C. section 360bbb-3(b)(1), unless the authorization is terminated or revoked.  Performed at [facility], [address], [place], [place] [address]   Comprehensive metabolic panel     Status: Abnormal   Collection Time: 09/15/23 12:45 AM  Result Value Ref Range   Sodium 135 135 - 145 mmol/L   Potassium 3.5 3.5 - 5.1 mmol/L   Chloride 102 98 - 111 mmol/L   CO2 22 22 - 32 mmol/L   Glucose, Bld 123 (H) 70 - 99 mg/dL    Comment: Glucose reference range applies only to samples taken after  fasting for at least 8 hours.   BUN 19 6 - 20 mg/dL   Creatinine, Ser 3.08 0.44 - 1.00 mg/dL   Calcium 7.7 (L) 8.9 - 10.3 mg/dL   Total Protein 7.0 6.5 - 8.1 g/dL   Albumin 2.5 (L) 3.5 - 5.0 g/dL   AST 34 15 - 41 U/L   ALT 127 (H) 0 - 44 U/L   Alkaline Phosphatase 135 (H) 38 - 126 U/L   Total Bilirubin 1.7 (H) <1.2 mg/dL   GFR, Estimated >65 >78 mL/min    Comment: (NOTE) Calculated using the CKD-EPI Creatinine Equation (2021)    Anion gap 11 5 - 15    Comment: Performed at [facility], [address] [place], [place] [address]  CBC with Differential     Status: Abnormal   Collection Time: 09/15/23 12:45 AM  Result Value Ref Range   WBC 11.0 (H) 4.0 - 10.5 K/uL   RBC 3.15 (L) 3.87 - 5.11 MIL/uL   Hemoglobin 7.7 (L) 12.0 - 15.0 g/dL    Comment: Reticulocyte Hemoglobin testing may be clinically indicated, consider ordering this additional test [id number]    HCT 23.6 (L) 36.0 - 46.0 %   MCV 74.9 (L) 80.0 - 100.0 fL   MCH 24.4 (L) 26.0 - 34.0 pg   MCHC 32.6 30.0 - 36.0 g/dL   RDW 44.0 (H) 10.2 - 72.5 %   Platelets 211 150 - 400 K/uL   nRBC 0.0 0.0 - 0.2 %   Neutrophils Relative % 79 %   Neutro Abs 8.9 (H) 1.7 - 7.7 K/uL   Lymphocytes Relative 15 %   Lymphs Abs 1.8 0.7 - 4.0 K/uL   Monocytes Relative 5 %   Monocytes Absolute 0.5 0.1 - 1.0 K/uL   Eosinophils Relative 0 %   Eosinophils Absolute 0.0 0.0 - 0.5 K/uL   Basophils Relative 0 %   Basophils Absolute 0.0 0.0 - 0.1 K/uL   Immature Granulocytes 1 %   Abs Immature Granulocytes 0.11 (H) 0.00 - 0.07 K/uL    Comment: Performed at [facility], [address] [place], [place]  [address]  Protime-INR     Status: Abnormal   Collection Time: 09/15/23 12:45 AM  Result Value Ref Range   Prothrombin Time 17.2 (H) 11.4 - 15.2 seconds   INR 1.4 (H) 0.8 - 1.2    Comment: (NOTE) INR goal varies based on device and disease states. Performed at [facility], [address], [place], [place] [address]   APTT     Status: Abnormal   Collection Time: 09/15/23 12:45 AM  Result Value Ref Range   aPTT 39 (H) 24 - 36 seconds    Comment:        IF BASELINE aPTT IS ELEVATED, SUGGEST PATIENT RISK ASSESSMENT BE USED TO DETERMINE APPROPRIATE ANTICOAGULANT THERAPY. Performed at [facility], [address], [place], [place] [address]   Blood Culture (routine x 2)     Status: None (Preliminary result)   Collection Time: 09/15/23 12:45 AM   Specimen: BLOOD LEFT FOREARM  Result Value Ref Range   Specimen Description BLOOD LEFT FOREARM    Special Requests      BOTTLES DRAWN AEROBIC AND ANAEROBIC Blood Culture adequate volume   Culture  Setup Time      GRAM POSITIVE COCCI IN CHAINS IN BOTH AEROBIC AND ANAEROBIC BOTTLES CRITICAL RESULT CALLED TO, READ BACK BY AND VERIFIED WITH: PHARMD C. SHADE [id number] @ 1810 FH Performed at [facility], [address] [place], [place] [address]    Culture GRAM POSITIVE COCCI    Report Status PENDING   Urinalysis, w/ Reflex to Culture (Infection Suspected) -Urine, Clean Catch     Status: Abnormal   Collection Time: 09/15/23 12:45 AM  Result Value Ref Range   Specimen Source URINE, CLEAN CATCH    Color, Urine AMBER (A) YELLOW    Comment: BIOCHEMICALS MAY BE AFFECTED BY COLOR   APPearance CLEAR CLEAR   Specific Gravity, Urine 1.026 1.005 - 1.030   pH 5.0 5.0 - 8.0   Glucose, UA NEGATIVE NEGATIVE mg/dL   Hgb urine dipstick SMALL (A) NEGATIVE   Bilirubin Urine NEGATIVE NEGATIVE   Ketones, ur NEGATIVE NEGATIVE mg/dL   Protein, ur 30 (A) NEGATIVE mg/dL   Nitrite NEGATIVE NEGATIVE   Leukocytes,Ua TRACE (A) NEGATIVE   RBC / HPF 0-5 0 - 5 RBC/hpf   WBC, UA 6-10 0 - 5 WBC/hpf    Comment:        Reflex urine culture not performed if WBC <=10, OR if Squamous epithelial cells >5. If Squamous epithelial cells >5 suggest recollection.    Bacteria, UA NONE SEEN NONE SEEN   Squamous Epithelial / HPF 0-5 0 - 5  /HPF   Mucus PRESENT     Comment: Performed at [facility], [address], [place], [place] [address]  Troponin I (High Sensitivity)     Status: None   Collection Time: 09/15/23 12:45 AM  Result Value Ref Range   Troponin I (High Sensitivity) 5 <18 ng/L    Comment: (NOTE) Elevated high sensitivity troponin I (hsTnI) values and significant  changes across serial measurements may suggest ACS but many other  chronic and acute conditions are known to elevate hsTnI results.  Refer to the "Links" section for chest pain algorithms and additional  guidance. Performed at [facility], [address], [place], [place] [address]   Pregnancy, urine     Status: None   Collection Time: 09/15/23 12:45 AM  Result Value Ref Range   Preg Test, Ur  NEGATIVE NEGATIVE    Comment:        THE SENSITIVITY OF THIS METHODOLOGY IS >25 mIU/mL. Performed at [facility], [address], [place], [place] [address]   Blood Culture (routine x 2)     Status: Abnormal (Preliminary result)   Collection Time: 09/15/23  1:03 AM   Specimen: BLOOD LEFT ARM  Result Value Ref Range   Specimen Description BLOOD LEFT ARM    Special Requests      BOTTLES DRAWN AEROBIC AND ANAEROBIC Blood Culture adequate volume   Culture  Setup Time      GRAM POSITIVE COCCI IN CHAINS IN BOTH AEROBIC AND ANAEROBIC BOTTLES CRITICAL RESULT CALLED TO, READ BACK BY AND VERIFIED WITH: PHARMD C. SHADE [id number] @ 1810 FH    Culture (A)     GROUP A STREP (S.PYOGENES) ISOLATED SUSCEPTIBILITIES TO FOLLOW Performed at [facility], [address] [place], [place] [address]    Report Status PENDING   Blood Culture ID Panel (Reflexed)     Status: Abnormal   Collection Time: 09/15/23  1:03 AM  Result Value Ref Range   Enterococcus faecalis NOT DETECTED NOT DETECTED   Enterococcus Faecium NOT DETECTED NOT DETECTED   Listeria monocytogenes NOT DETECTED NOT DETECTED   Staphylococcus  species NOT DETECTED NOT DETECTED   Staphylococcus aureus (BCID) NOT DETECTED NOT DETECTED   Staphylococcus epidermidis NOT DETECTED NOT DETECTED   Staphylococcus lugdunensis NOT DETECTED NOT DETECTED   Streptococcus species DETECTED (A) NOT DETECTED    Comment: CRITICAL RESULT CALLED TO, READ BACK BY AND VERIFIED WITH: PHARMD C. SHADE [id number] @ 1810 FH    Streptococcus agalactiae NOT DETECTED NOT DETECTED   Streptococcus pneumoniae NOT DETECTED NOT DETECTED   Streptococcus pyogenes DETECTED (A) NOT DETECTED    Comment: CRITICAL RESULT CALLED TO, READ BACK BY AND VERIFIED WITH: PHARMD C. SHADE [id number] @ 1810 FH    A.calcoaceticus-baumannii NOT DETECTED NOT DETECTED   Bacteroides fragilis NOT DETECTED NOT DETECTED   Enterobacterales NOT DETECTED NOT DETECTED   Enterobacter cloacae complex NOT DETECTED NOT DETECTED   Escherichia coli NOT DETECTED NOT DETECTED   Klebsiella aerogenes NOT DETECTED NOT DETECTED   Klebsiella oxytoca NOT DETECTED NOT DETECTED   Klebsiella pneumoniae NOT DETECTED NOT DETECTED   Proteus species NOT DETECTED NOT DETECTED   Salmonella species NOT DETECTED NOT DETECTED   Serratia marcescens NOT DETECTED NOT DETECTED   Haemophilus influenzae NOT DETECTED NOT DETECTED   Neisseria meningitidis NOT DETECTED NOT DETECTED   Pseudomonas aeruginosa NOT DETECTED NOT DETECTED   Stenotrophomonas maltophilia NOT DETECTED NOT DETECTED   Candida albicans NOT DETECTED NOT DETECTED   Candida auris NOT DETECTED NOT DETECTED   Candida glabrata NOT DETECTED NOT DETECTED   Candida krusei NOT DETECTED NOT DETECTED   Candida parapsilosis NOT DETECTED NOT DETECTED   Candida tropicalis NOT DETECTED NOT DETECTED   Cryptococcus neoformans/gattii NOT DETECTED NOT DETECTED    Comment: Performed at [facility], [address], [place], [place] [address]  Aerobic/Anaerobic Culture w Gram Stain (surgical/deep wound)     Status: None (Preliminary result)   Collection Time: 09/15/23   1:43 AM   Specimen: BLOOD LEFT FOREARM; Abscess  Result Value Ref Range   Specimen Description      BLOOD LEFT FOREARM Performed at [facility], [address] [place], [place] [address]    Special Requests      NONE Performed at [facility],  [address] [place], [place] [address]    Gram Stain      FEW WBC PRESENT, PREDOMINANTLY MONONUCLEAR FEW GRAM POSITIVE COCCI IN PAIRS AND CHAINS FEW GRAM VARIABLE ROD    Culture      CULTURE REINCUBATED FOR BETTER GROWTH Performed at [facility], [address], [place], [place] [address]    Report Status PENDING   [facility] and screen  [facility]     Status: None   Collection Time: 09/15/23  1:43 AM  Result Value Ref Range   ABO/RH(D) A NEG    Antibody Screen NEG    Sample Expiration      09/18/2023,2359 Performed at [facility], [address], [place], [place] [address]   Lactic acid, plasma     Status: None   Collection Time: 09/15/23  1:43 AM  Result Value Ref Range   Lactic Acid, Venous 1.2 0.5 - 1.9 mmol/L    Comment: Performed at [facility], [address], [place], [place] [address]  Body fluid culture w Gram Stain     Status: None (Preliminary result)   Collection Time: 09/15/23  2:37 AM   Specimen: KNEE; Body Fluid  Result Value Ref Range   Specimen Description      KNEE RIGHT Performed at [facility], [address], [place], [place] [address]    Special Requests      NONE Performed at [facility], [address] [place], [place] [address]    Gram Stain      FEW WBC PRESENT,BOTH PMN AND MONONUCLEAR RARE GRAM POSITIVE COCCI IN PAIRS Performed at [facility], [address] [place], [place] [address]    Culture PENDING    Report Status PENDING   Cell count + diff,  w/ cryst-synvl fld     Status: Abnormal   Collection Time: 09/15/23  2:37 AM  Result Value Ref  Range   Color, Synovial RED (A) YELLOW   Appearance-Synovial CLOUDY (A) CLEAR   Crystals, Fluid NO CRYSTALS SEEN    WBC, Synovial 63,200 (H) 0 - 200 /cu mm   Neutrophil, Synovial 84 (H) 0 - 25 %   Lymphocytes-Synovial Fld 3 0 - 20 %   Monocyte-Macrophage-Synovial Fluid 8 (L) 50 - 90 %   Eosinophils-Synovial 5 (H) 0 - 1 %   Other Cells-SYN      Intra and/or extracellular organisms present, correlate with Microbiology.    Comment: Performed at [facility], [address], [place], [place] [address]  Troponin I (High Sensitivity)     Status: None   Collection Time: 09/15/23  2:44 AM  Result Value Ref Range   Troponin I (High Sensitivity) 5 <18 ng/L    Comment: (NOTE) Elevated high sensitivity troponin I (hsTnI) values and significant  changes across serial measurements may suggest ACS but many other  chronic and acute conditions are known to elevate hsTnI results.  Refer to the "Links" section for chest pain algorithms and additional  guidance. Performed at [facility], [address] [place], [place] [address]   Rapid urine drug screen (hospital performed)     Status: Abnormal   Collection Time: 09/15/23  5:40 AM  Result Value Ref Range   Opiates POSITIVE (A) NONE DETECTED   Cocaine POSITIVE (A) NONE DETECTED   Benzodiazepines NONE DETECTED NONE DETECTED   Amphetamines POSITIVE (A) NONE DETECTED   Tetrahydrocannabinol POSITIVE (A) NONE  DETECTED   Barbiturates NONE DETECTED NONE DETECTED    Comment: (NOTE) DRUG SCREEN FOR MEDICAL PURPOSES ONLY.  IF CONFIRMATION IS NEEDED FOR ANY PURPOSE, NOTIFY LAB WITHIN 5 DAYS.  LOWEST DETECTABLE LIMITS FOR URINE DRUG SCREEN Drug Class                     Cutoff (ng/mL) Amphetamine and metabolites    1000 Barbiturate and metabolites    200 Benzodiazepine                 200 Opiates and metabolites        300 Cocaine and metabolites        300 THC                            50 Performed at [facility], [address], [place], [place] [address]   HIV Antibody (routine testing w rflx)     Status: None   Collection Time: 09/15/23  8:12 AM  Result Value Ref Range   HIV Screen 4th Generation wRfx Non Reactive Non Reactive    Comment: Performed at [facility], [address] [place], [place] [address]  Hepatitis panel, acute     Status: Abnormal   Collection Time: 09/15/23  8:12 AM  Result Value Ref Range   Hepatitis B Surface Ag NON REACTIVE NON REACTIVE   HCV Ab Reactive (A) NON REACTIVE    Comment: (NOTE) The CDC recommends that a Reactive HCV antibody result be followed up  with a HCV Nucleic Acid Amplification test.     Hep A IgM NON REACTIVE NON REACTIVE   Hep B C IgM NON REACTIVE NON REACTIVE    Comment: Performed at [facility], [address] [address], [place], [place] [address]  Type and screen [facility]     Status: None (Preliminary result)   Collection Time: 09/15/23  8:12 AM  Result Value Ref Range   ABO/RH(D) A NEG    Antibody Screen NEG    Sample Expiration 09/18/2023,2359    Unit Number [id number]    Blood Component Type RED CELLS,LR    Unit division 00    Status of Unit ALLOCATED    Transfusion Status OK TO TRANSFUSE    Crossmatch Result      Compatible Performed at [facility], [address], [place], [place] [address]    Unit Number [id number]    Blood Component Type RBC LR PHER1    Unit division 00    Status of Unit ALLOCATED    Transfusion Status OK TO TRANSFUSE    Crossmatch Result Compatible   Prepare RBC (crossmatch)     Status: None   Collection Time: 09/15/23  8:12 AM  Result Value Ref Range   Order Confirmation      ORDER PROCESSED BY BLOOD BANK Performed at [facility], [address], [place], [place] [address]   TSH     Status: None   Collection Time: 09/15/23  8:12 AM  Result Value Ref Range   TSH 1.366 0.350 - 4.500 uIU/mL    Comment:  Performed by a 3rd Generation assay with a functional sensitivity of <=0.01 uIU/mL. Performed at [facility], [address], [place], [place] [address]   Antithrombin III     Status: Abnormal   Collection Time: 09/15/23  8:12 AM  Result Value  Ref Range   AntiThromb III Func 45 (L) 75 - 120 %    Comment: Performed at [facility], [address] [place], [place] [address]  Hemoglobin and hematocrit, blood     Status: Abnormal   Collection Time: 09/15/23  1:19 PM  Result Value Ref Range   Hemoglobin 7.2 (L) 12.0 - 15.0 g/dL   HCT 46.9 (L) 62.9 - 52.8 %    Comment: Performed at [facility], [address], [place], [place] [address]  Aerobic/Anaerobic Culture w Gram Stain (surgical/deep wound)     Status: None (Preliminary result)   Collection Time: 09/15/23  4:12 PM   Specimen: PATH Cytology Misc. fluid; Body Fluid  Result Value Ref Range   Specimen Description      KNEE Performed at [facility], [address] [place], [place] [address]    Special Requests      RIGHT Performed at [facility], [address], [place], [place] [address]    Gram Stain      FEW WBC PRESENT,BOTH PMN AND MONONUCLEAR NO ORGANISMS SEEN Performed at [facility], [address], [place], [place] [address]    Culture PENDING    Report Status PENDING   Hemoglobin and hematocrit, blood     Status: Abnormal   Collection Time: 09/15/23  6:44 PM  Result Value Ref Range   Hemoglobin 7.0 (L) 12.0 - 15.0 g/dL   HCT 47.4 (L) 25.9 - 56.3 %    Comment: Performed at [facility], [address], [place], [place] [address]    Current Facility-Administered Medications  Medication Dose Route Frequency Provider Last Rate Last Admin   0.9 %  sodium chloride infusion (Manually program via Guardrails IV Fluids)   Intravenous Once Joen Laura, MD       acetaminophen (TYLENOL) tablet 650 mg  650 mg  Oral Q6H PRN Joen Laura, MD   650 mg at 09/15/23 1208   Or   acetaminophen (TYLENOL) suppository 650 mg  650 mg Rectal Q6H PRN Joen Laura, MD       buprenorphine-naloxone (SUBOXONE) 2-0.5 mg per SL tablet 2 tablet  2 tablet Sublingual PRN Maryagnes Amos, FNP   2 tablet at 09/16/23 0819   buprenorphine-naloxone (SUBOXONE) 8-2 mg per SL tablet 1 tablet  1 tablet Sublingual TID Maryagnes Amos, FNP       ceFAZolin (ANCEF) IVPB 2g/100 mL premix  2 g Intravenous Q8H Kc, Ramesh, MD       Chlorhexidine Gluconate Cloth 2 % PADS 6 each  6 each Topical Daily Joen Laura, MD   6 each at 09/15/23 1325   cloNIDine (CATAPRES) tablet 0.1 mg  0.1 mg Oral QID Maryagnes Amos, FNP       Followed by   Melene Muller ON 09/18/2023] cloNIDine (CATAPRES) tablet 0.1 mg  0.1 mg Oral BH-qamhs Starkes-Perry, Juel Burrow, FNP       Followed by   Melene Muller ON 09/20/2023] cloNIDine (CATAPRES) tablet 0.1 mg  0.1 mg Oral QAC breakfast Starkes-Perry, Juel Burrow, FNP       dicyclomine (BENTYL) tablet 20 mg  20 mg Oral Q6H PRN Maryagnes Amos, FNP       ferrous sulfate tablet 325 mg  325 mg Oral Q breakfast Joen Laura, MD   325 mg at 09/16/23 0807   hydrOXYzine (ATARAX) tablet 25 mg  25 mg Oral Q6H PRN Caryn Bee  S, FNP       ketorolac (TORADOL) 15 MG/ML injection 15 mg  15 mg Intravenous Q6H PRN Joen Laura, MD   15 mg at 09/16/23 4098   lactated ringers bolus 250 mL  250 mL Intravenous Once Joen Laura, MD       linezolid (ZYVOX) IVPB 600 mg  600 mg Intravenous Q12H Kc, Dayna Barker, MD 300 mL/hr at 09/16/23 1051 Infusion Verify at 09/16/23 1051   loperamide (IMODIUM) capsule 2-4 mg  2-4 mg Oral PRN Starkes-Perry, Juel Burrow, FNP       methocarbamol (ROBAXIN) tablet 500 mg  500 mg Oral Q8H PRN Starkes-Perry, Juel Burrow, FNP       naproxen (NAPROSYN) tablet 500 mg  500 mg Oral BID PRN Maryagnes Amos, FNP       nicotine (NICODERM CQ - dosed in mg/24 hours)  patch 21 mg  21 mg Transdermal Daily Joen Laura, MD   21 mg at 09/16/23 1015   ondansetron (ZOFRAN) tablet 4 mg  4 mg Oral Q6H PRN Joen Laura, MD       Or   ondansetron (ZOFRAN) injection 4 mg  4 mg Intravenous Q6H PRN Joen Laura, MD       Oral care mouth rinse  15 mL Mouth Rinse 4 times per day Joen Laura, MD   15 mL at 09/15/23 2128   Oral care mouth rinse  15 mL Mouth Rinse PRN Joen Laura, MD       senna-docusate (Senokot-S) tablet 1 tablet  1 tablet Oral QHS PRN Joen Laura, MD       sodium chloride flush (NS) 0.9 % injection 3 mL  3 mL Intravenous Q12H Joen Laura, MD   3 mL at 09/16/23 1015   sodium chloride flush (NS) 0.9 % injection 3 mL  3 mL Intravenous Q12H Joen Laura, MD   3 mL at 09/16/23 1015   sodium chloride flush (NS) 0.9 % injection 3 mL  3 mL Intravenous PRN Joen Laura, MD        Musculoskeletal: Strength & Muscle Tone: within normal limits Gait & Station: normal Patient leans: N/A            Psychiatric Specialty Exam:  Presentation  General Appearance:  Disheveled  Eye Contact: Good  Speech: Clear and Coherent; Normal Rate  Speech Volume: Normal  Handedness: Right   Mood and Affect  Mood: Depressed; Anxious; Irritable  Affect: Appropriate; Congruent   Thought Process  Thought Processes: Coherent; Linear; Goal Directed  Descriptions of Associations:Intact  Orientation:Full (Time, Place and Person)  Thought Content:WDL  History of Schizophrenia/Schizoaffective disorder:No data recorded Duration of Psychotic Symptoms:No data recorded Hallucinations:Hallucinations: None  Ideas of Reference:None  Suicidal Thoughts:Suicidal Thoughts: No  Homicidal Thoughts:Homicidal Thoughts: No   Sensorium  Memory: Immediate Fair; Recent Good  Judgment: Fair  Insight: Fair   Art therapist  Concentration: Good  Attention  Span: Good  Recall: Fair  Fund of Knowledge: Good  Language: Good   Psychomotor Activity  Psychomotor Activity: Psychomotor Activity: Normal   Assets  Assets: Communication Skills; Desire for Improvement; Housing; Resilience; Social Support   Sleep  Sleep: Sleep: Poor Number of Hours of Sleep: 4   Physical Exam: Physical Exam Vitals and nursing note reviewed.  Constitutional:      General: She is awake.     Appearance: She is normal weight.  HENT:     Mouth/Throat:  Dentition: Abnormal dentition. Dental caries present.  Skin:    General: Skin is warm.     Findings: Abscess, ecchymosis, erythema, rash and wound (multiple, necrotic ulcers noted to L dorsal aspect of wrist) present.  Neurological:     General: No focal deficit present.     Mental Status: She is alert and oriented to person, place, and time.  Psychiatric:        Mood and Affect: Mood is anxious.        Behavior: Behavior normal. Behavior is cooperative.    Review of Systems  Psychiatric/Behavioral:  Positive for substance abuse (fentanyl, heroin, and meth).   All other systems reviewed and are negative.  Blood pressure 131/62, pulse 95, temperature 98 F (36.7 C), temperature source Oral, resp. rate 19, last menstrual period 07/29/2023, SpO2 (!) 89%, unknown if currently breastfeeding. There is no height or weight on file to calculate BMI.  Treatment Plan Summary: Daily contact with patient to assess and evaluate symptoms and progress in treatment, Medication management, and Plan   Due to the complexity of illicit substance use to include suspected xylazine, fentanyl, heroin will require careful consideration for precipitated withdrawal and management of symptoms will require perfect timing.     Suspect Xylazine use clinical findings consistent with use (peripheral edema, necrotic ulcers, abscesses, skin tracks, and skin infections).  -Will start Clonidine detox, due not suspect her  pressures will tolerate a Clonidine patch.  -Will increase Suboxone 8-2 TID.  Would like to reduce precipitated withdrawal and chances of leaving AMA. -Will start NRT.  -Continue with limited visitor policy to prevent contraband exposure.  -TOC referral for inpatient rehab, patient in action phase.  If inpatient rehab is not available, consider Suboxone outpatient follow-up, prior to discharge.  Once patient is more medically stable will address underlying psychiatric conditions such as depression/anxiety and consider starting an antidepressant depending on length of stay.  As noted above primary goal is to treat opiate use disorder and prevent patient from leaving AMA due to high medical complexities.  There do not appear to be any barriers to discharge, and or acute psychiatric problems that warrant inpatient psychiatric admission at this time.  Labs reviewed and assessed to include urine drug screen positive for amphetamines, opiates, cocaine, THC.  White blood count 11.0 hemoglobin 7.2, hematocrit 23.2, elevated alkaline phosphatase 135, low albumin 2.5, elevated bilirubin 1.7, and elevated ALT 127.  CT scan shows scattered nodular opacities in the lungs bilaterally consistent with infectious, inflammatory, and or neoplastic.  Disposition: No evidence of imminent risk to Pedretti or others at present.   Patient does not meet criteria for psychiatric inpatient admission. Supportive therapy provided about ongoing stressors. Refer to IOP. Discussed crisis plan, support from social network, calling 911, coming to the Emergency Department, and calling Suicide Hotline. Open to inpatient rehab    Maryagnes Amos, FNP 09/16/2023 10:54 AM

## 2023-09-17 DIAGNOSIS — L02419 Cutaneous abscess of limb, unspecified: Secondary | ICD-10-CM | POA: Diagnosis not present

## 2023-09-17 DIAGNOSIS — R918 Other nonspecific abnormal finding of lung field: Secondary | ICD-10-CM | POA: Diagnosis not present

## 2023-09-17 DIAGNOSIS — B951 Streptococcus, group B, as the cause of diseases classified elsewhere: Secondary | ICD-10-CM | POA: Diagnosis not present

## 2023-09-17 DIAGNOSIS — R7881 Bacteremia: Secondary | ICD-10-CM | POA: Diagnosis not present

## 2023-09-17 DIAGNOSIS — I82A11 Acute embolism and thrombosis of right axillary vein: Secondary | ICD-10-CM | POA: Diagnosis not present

## 2023-09-17 DIAGNOSIS — B955 Unspecified streptococcus as the cause of diseases classified elsewhere: Secondary | ICD-10-CM

## 2023-09-17 DIAGNOSIS — M00261 Other streptococcal arthritis, right knee: Secondary | ICD-10-CM | POA: Diagnosis not present

## 2023-09-17 DIAGNOSIS — B182 Chronic viral hepatitis C: Secondary | ICD-10-CM

## 2023-09-17 LAB — COMPREHENSIVE METABOLIC PANEL
ALT: 194 U/L — ABNORMAL HIGH (ref 0–44)
AST: 247 U/L — ABNORMAL HIGH (ref 15–41)
Albumin: 1.8 g/dL — ABNORMAL LOW (ref 3.5–5.0)
Alkaline Phosphatase: 140 U/L — ABNORMAL HIGH (ref 38–126)
Anion gap: 9 (ref 5–15)
BUN: 23 mg/dL — ABNORMAL HIGH (ref 6–20)
CO2: 22 mmol/L (ref 22–32)
Calcium: 8.1 mg/dL — ABNORMAL LOW (ref 8.9–10.3)
Chloride: 110 mmol/L (ref 98–111)
Creatinine, Ser: 0.77 mg/dL (ref 0.44–1.00)
GFR, Estimated: >60 mL/min (ref >60)
Glucose, Bld: 164 mg/dL — ABNORMAL HIGH (ref 70–99)
Potassium: 4.4 mmol/L (ref 3.5–5.1)
Sodium: 141 mmol/L (ref 135–145)
Total Bilirubin: 0.5 mg/dL (ref <1.2)
Total Protein: 6.1 g/dL — ABNORMAL LOW (ref 6.5–8.1)

## 2023-09-17 LAB — CULTURE, BLOOD (ROUTINE X 2): Special Requests: ADEQUATE

## 2023-09-17 LAB — LUPUS ANTICOAGULANT PANEL
DRVVT: 70.8 s — ABNORMAL HIGH (ref 0.0–47.0)
PTT Lupus Anticoagulant: 57.7 s — ABNORMAL HIGH (ref 0.0–43.5)

## 2023-09-17 LAB — DRVVT MIX: dRVVT Mix: 46.5 s — ABNORMAL HIGH (ref 0.0–40.4)

## 2023-09-17 LAB — CBC WITH DIFFERENTIAL/PLATELET
Abs Immature Granulocytes: 0.07 10*3/uL (ref 0.00–0.07)
Basophils Absolute: 0 10*3/uL (ref 0.0–0.1)
Basophils Relative: 0 %
Eosinophils Absolute: 0 10*3/uL (ref 0.0–0.5)
Eosinophils Relative: 0 %
HCT: 23.1 % — ABNORMAL LOW (ref 36.0–46.0)
Hemoglobin: 7.1 g/dL — ABNORMAL LOW (ref 12.0–15.0)
Immature Granulocytes: 1 %
Lymphocytes Relative: 28 %
Lymphs Abs: 2.4 10*3/uL (ref 0.7–4.0)
MCH: 24.2 pg — ABNORMAL LOW (ref 26.0–34.0)
MCHC: 30.7 g/dL (ref 30.0–36.0)
MCV: 78.8 fL — ABNORMAL LOW (ref 80.0–100.0)
Monocytes Absolute: 0.7 10*3/uL (ref 0.1–1.0)
Monocytes Relative: 8 %
Neutro Abs: 5.5 10*3/uL (ref 1.7–7.7)
Neutrophils Relative %: 63 %
Platelets: 248 10*3/uL (ref 150–400)
RBC: 2.93 MIL/uL — ABNORMAL LOW (ref 3.87–5.11)
RDW: 19.2 % — ABNORMAL HIGH (ref 11.5–15.5)
WBC: 8.7 10*3/uL (ref 4.0–10.5)
nRBC: 0 % (ref 0.0–0.2)

## 2023-09-17 LAB — DRVVT CONFIRM: dRVVT Confirm: 1.2 {ratio} (ref 0.8–1.2)

## 2023-09-17 LAB — HEPATITIS C GENOTYPE

## 2023-09-17 LAB — PROTEIN S ACTIVITY: Protein S Activity: 60 % — ABNORMAL LOW (ref 63–140)

## 2023-09-17 LAB — HCV RNA QUANT RFLX ULTRA OR GENOTYP
HCV RNA Qnt(log copy/mL): 6.161 {Log}
HepC Qn: 1450000 [IU]/mL

## 2023-09-17 LAB — RPR: RPR Ser Ql: NONREACTIVE

## 2023-09-17 LAB — CARDIOLIPIN ANTIBODIES, IGG, IGM, IGA
Anticardiolipin IgA: <9 [APL'U]/mL (ref 0–11)
Anticardiolipin IgG: <9 [GPL'U]/mL (ref 0–14)
Anticardiolipin IgM: 15 [MPL'U]/mL — ABNORMAL HIGH (ref 0–12)

## 2023-09-17 LAB — BETA-2-GLYCOPROTEIN I ABS, IGG/M/A
Beta-2 Glyco I IgG: 12 GPI IgG units (ref 0–20)
Beta-2-Glycoprotein I IgA: 18 GPI IgA units (ref 0–25)
Beta-2-Glycoprotein I IgM: 21 GPI IgM units (ref 0–32)

## 2023-09-17 LAB — PTT-LA MIX: PTT-LA Mix: 56.2 s — ABNORMAL HIGH (ref 0.0–40.5)

## 2023-09-17 LAB — PROTEIN C ACTIVITY: Protein C Activity: 44 % — ABNORMAL LOW (ref 73–180)

## 2023-09-17 LAB — PROTEIN S, TOTAL: Protein S Ag, Total: 73 % (ref 60–150)

## 2023-09-17 LAB — PROTEIN C, TOTAL: Protein C, Total: 30 % — ABNORMAL LOW (ref 60–150)

## 2023-09-17 LAB — HEXAGONAL PHASE PHOSPHOLIPID: Hexagonal Phase Phospholipid: 7 s (ref 0–11)

## 2023-09-17 MED ORDER — PNEUMOCOCCAL 20-VAL CONJ VACC 0.5 ML IM SUSY
0.5000 mL | PREFILLED_SYRINGE | INTRAMUSCULAR | Status: DC
  Filled 2023-09-17: qty 0.5

## 2023-09-17 MED ORDER — RIVAROXABAN 20 MG PO TABS: 20.0000 mg | ORAL_TABLET | Freq: Every day | ORAL | Status: DC

## 2023-09-17 MED ORDER — LINEZOLID 600 MG PO TABS
600.0000 mg | ORAL_TABLET | Freq: Two times a day (BID) | ORAL | Status: AC
  Administered 2023-09-17 – 2023-09-18 (×4): 600 mg via ORAL
  Filled 2023-09-17 (×4): qty 1

## 2023-09-17 MED ORDER — RIVAROXABAN 15 MG PO TABS
15.0000 mg | ORAL_TABLET | Freq: Two times a day (BID) | ORAL | Status: DC
  Administered 2023-09-17 – 2023-09-30 (×26): 15 mg via ORAL
  Filled 2023-09-17 (×26): qty 1

## 2023-09-17 NOTE — Progress Notes (Signed)
   09/17/23 1400  Spiritual Encounters  Type of Visit Attempt (pt unavailable)  Spiritual Framework  Family Stress Factors Not reviewed  Intervention Outcomes  Outcomes Other (comment) (Patient was asleep the previous chaplain tried and they were unable to see patient.)  Spiritual Care Plan  Spiritual Care Issues Still Outstanding Referring to oncoming chaplain for further support   Patient is sleeping Chaplains have on a number of occasions to make contact with the patient.

## 2023-09-17 NOTE — TOC Benefit Eligibility Note (Signed)
Transition of Care [facility]) Benefit Eligibility Note    Patient Details  Name: Laurie [name] MRN: [id number] Date of Birth: 1996/08/03   Medication/Dose: Eliquis/ Apixaban and Xarelto/ Rivaroxaban  Covered?: Yes     Prescription Coverage Preferred Pharmacy: local  Spoke with Person/Company/Phone Number:: [place] [facility]  Co-Pay: $4.00  Prior Approval: No  Deductible: Met  Additional Notes: All Rx's are covered at a $4.00 copay    Caren Macadam Phone Number: 09/17/2023, 10:06 AM

## 2023-09-17 NOTE — Evaluation (Signed)
Physical Therapy Evaluation Patient Details Name: Laurie [name] MRN: [id number] DOB: [birth year] Today's Date: 09/17/2023  History of Present Illness  27 y.o. F with hx IVDA and polysubstance abuse, hx familial factor V Leiden, smoking, PTSD and chronic hep C who presented with malaise, chest discomfort, draining arm wounds and right knee swelling.   Found to have group A strep bacteremia, pulmonary nodules, right arm DVT, right knee septic arthritis.  Clinical Impression  Pt admitted with above diagnosis.  Pt currently with functional limitations due to the deficits listed below (see PT Problem List). Pt will benefit from acute skilled PT to increase their independence and safety with mobility to allow discharge.     The patient did participate in right leg ROM , used belt on foot to [place] assist.  The patient  did stand and placed  a small amount of WB  when standing at Rw. Declined to attempt ambulation. Patient tolerated well.      If plan is discharge home, recommend the following: A little help with walking and/or transfers;Assistance with cooking/housework;Assist for transportation;Help with stairs or ramp for entrance   Can travel by private vehicle        Equipment Recommendations Rolling walker (2 wheels)  Recommendations for Other Services       Functional Status Assessment Patient has had a recent decline in their functional status and demonstrates the ability to make significant improvements in function in a reasonable and predictable amount of time.     Precautions / Restrictions Precautions Precautions: Fall Restrictions Weight Bearing Restrictions: No      Mobility  Bed Mobility Overal bed mobility: Needs Assistance Bed Mobility: Supine to Sit, Sit to Supine     Supine to sit: Supervision Sit to supine: Supervision        Transfers Overall transfer level: Needs assistance Equipment used: Rolling walker (2 wheels) Transfers: Sit to/from Stand Sit to  Stand: Min assist           General transfer comment: encouragement to attempt WB on the right leg, took 2 side steps to left    Ambulation/Gait                  Stairs            Wheelchair Mobility     Tilt Bed    Modified Rankin (Stroke Patients Only)       Balance Overall balance assessment: Mild deficits observed, not formally tested                                           Pertinent Vitals/Pain Pain Assessment Pain Assessment: 0-10 Pain Score: 4  Pain Location: right knee Pain Descriptors / Indicators: Discomfort Pain Intervention(s): Monitored during session, Repositioned    Home Living Family/patient expects to be discharged to:: Shelter/Homeless                        Prior Function Prior Level of Function : Independent/Modified Independent                     Extremity/Trunk Assessment   Upper Extremity Assessment Upper Extremity Assessment: Overall WFL for tasks assessed    Lower Extremity Assessment Lower Extremity Assessment: RLE deficits/detail RLE Deficits / Details: -15-60 knee    Cervical / Trunk Assessment Cervical / Trunk Assessment: Normal  Communication   Communication Communication: No apparent difficulties  Cognition Arousal: Alert Behavior During Therapy: WFL for tasks assessed/performed Overall Cognitive Status: Within Functional Limits for tasks assessed                                          General Comments      Exercises General Exercises - Lower Extremity Ankle Circles/Pumps: AROM, Right Quad Sets: AROM, Right, 10 reps, Supine Heel Slides: AAROM, Right, 10 reps, Supine Straight Leg Raises: AROM, 10 reps, Right   Assessment/Plan    PT Assessment Patient needs continued PT services  PT Problem List         PT Treatment Interventions Functional mobility training;Therapeutic activities;Gait training;Patient/family education;Therapeutic  exercise    PT Goals (Current goals can be found in the Care Plan section)  Acute Rehab PT Goals Patient Stated Goal: to walk PT Goal Formulation: With patient Time For Goal Achievement: 10/01/23 Potential to Achieve Goals: Good    Frequency       Co-evaluation               AM-PAC PT "6 Clicks" Mobility  Outcome Measure Help needed turning from your back to your side while in a flat bed without using bedrails?: None Help needed moving from lying on your back to sitting on the side of a flat bed without using bedrails?: None Help needed moving to and from a bed to a chair (including a wheelchair)?: None Help needed standing up from a chair using your arms (e.g., wheelchair or bedside chair)?: A Little Help needed to walk in hospital room?: Total Help needed climbing 3-5 steps with a railing? : Total 6 Click Score: 17    End of Session Equipment Utilized During Treatment: Gait belt Activity Tolerance: Patient tolerated treatment well Patient left: in bed;with bed alarm set;with call bell/phone within reach Nurse Communication: Mobility status PT Visit Diagnosis: Unsteadiness on feet (R26.81);Pain;Difficulty in walking, not elsewhere classified (R26.2) Pain - Right/Left: Right Pain - part of body: Knee    Time: 1914-7829 PT Time Calculation (min) (ACUTE ONLY): 15 min   Charges:   PT Evaluation $PT Eval Low Complexity: 1 Low   PT General Charges $$ ACUTE PT VISIT: 1 Visit         Blanchard Kelch PT Acute Rehabilitation Services Office [phone] Weekend [phone]   Rada Hay 09/17/2023, 4:51 PM

## 2023-09-17 NOTE — Progress Notes (Signed)
    2 Days Post-Op Procedure(s) (LRB): IRRIGATION AND DEBRIDEMENT KNEE (Right)  Subjective:  Patient reports knee pain as mild.  Slept ok.  Lying comfortably in bed this morning.  Denies distal n/t.  No new concerns.  Objective:   VITALS:   Vitals:   09/16/23 1700 09/16/23 1747 09/16/23 1915 09/17/23 0535  BP: (!) 128/43 (!) 134/57 (!) 119/56 (!) 123/53  Pulse: 64 60 62 (!) 55  Resp: 17 17 20 20   Temp:  97.8 F (36.6 C) 98.7 F (37.1 C) 98 F (36.7 C)  TempSrc:  Oral Oral Oral  SpO2: 98% 100% 98% 99%  Weight:    62.4 kg    Sensation intact distally Intact pulses distally Dorsiflexion/Plantar flexion intact Incision: dressing C/D/I Compartment soft Hemovac drain with minimal serosang drainage in cannister, removed today Wiggles toes appropriately    Lab Results  Component Value Date   WBC 8.7 09/17/2023   HGB 7.1 (L) 09/17/2023   HCT 23.1 (L) 09/17/2023   MCV 78.8 (L) 09/17/2023   PLT 248 09/17/2023   BMET    Component Value Date/Time   NA 141 09/17/2023 0555   NA 140 01/09/2016 1538   K 4.4 09/17/2023 0555   CL 110 09/17/2023 0555   CO2 22 09/17/2023 0555   GLUCOSE 164 (H) 09/17/2023 0555   BUN 23 (H) 09/17/2023 0555   BUN 13 01/09/2016 1538   CREATININE 0.77 09/17/2023 0555   CALCIUM 8.1 (L) 09/17/2023 0555   GFRNONAA >60 09/17/2023 0555    Xray: none  Assessment/Plan: 2 Days Post-Op   Principal Problem:   Sepsis (HCC) Active Problems:   Abscess of upper extremity   Septic arthritis of knee, right (HCC)   Multiple lung nodules-secondary to septic emboli   Acute deep vein thrombosis (DVT) of axillary vein of right upper extremity (HCC)   Microcytic anemia   Transaminitis   IVDU (intravenous drug user)   Thyroid nodule   Tobacco dependence due to cigarettes   Opiate use   Streptococcal bacteremia  S/p R knee I&D 11/25  11/27: Drain removed, change drain dressing PRN if saturated.  Continue abx per ID, cultures pending.  Okay to resume  full-dose DVT anticoagulation per medicine.  Post op recs: WB: WBAT Abx: Continue antibiotics per infectious disease, currently on linezolid and ancef Imaging: None Dressing: keep intact until follow up, document drain output every 12 hours.  Plan to remove drain once output less than 30 cc per shift likely after about 48 hours. DVT prophylaxis: Okay to resume prophylaxis anticoagulation postop day 1, would not start full dose anticoagulation for at least 24 hours pending drain output. Follow up: 2 weeks after surgery for a wound check with Dr. Blanchie Dessert at [facility].  Address: [address], [place], [place] [address]  Office Phone: [phone]    Laurie [name] 09/17/2023, 7:39 AM    Contact information:   Weekdays 7am-5pm epic message Dr. Blanchie Dessert, or call office for patient follow up: [phone] After hours and holidays please check [email or web address] for group call information for [facility]

## 2023-09-17 NOTE — Progress Notes (Signed)
[facility]    Date of Admission:  09/14/2023   Total days of antibiotics 4   ID: Laurie [name] is a 27 y.o. female with  disseminated group a strep infection Principal Problem:   Sepsis (HCC) Active Problems:   Abscess of upper extremity   Septic arthritis of knee, right (HCC)   Multiple lung nodules-secondary to septic emboli   Acute deep vein thrombosis (DVT) of axillary vein of right upper extremity (HCC)   Microcytic anemia   Transaminitis   IVDU (intravenous drug user)   Thyroid nodule   Tobacco dependence due to cigarettes   Opiate use   Streptococcal bacteremia    Subjective: Afebrile, pain to arm is improving.  Medications:   buprenorphine-naloxone  1 tablet Sublingual TID   Chlorhexidine Gluconate Cloth  6 each Topical Daily   cloNIDine  0.1 mg Oral QID   Followed by   Melene Muller ON 09/18/2023] cloNIDine  0.1 mg Oral BH-qamhs   Followed by   Melene Muller ON 09/20/2023] cloNIDine  0.1 mg Oral QAC breakfast   ferrous sulfate  325 mg Oral Q breakfast   linezolid  600 mg Oral Q12H   nicotine  21 mg Transdermal Daily   mouth rinse  15 mL Mouth Rinse 4 times per day   [START ON 09/18/2023] pneumococcal 20-valent conjugate vaccine  0.5 mL Intramuscular Tomorrow-1000   sodium chloride flush  3 mL Intravenous Q12H   sodium chloride flush  3 mL Intravenous Q12H    Objective: Vital signs in last 24 hours: Temp:  [97.8 F (36.6 C)-98.7 F (37.1 C)] 98 F (36.7 C) (11/27 0535) Pulse Rate:  [55-78] 55 (11/27 0535) Resp:  [14-20] 20 (11/27 0535) BP: (119-138)/(43-76) 123/53 (11/27 0535) SpO2:  [98 %-100 %] 99 % (11/27 0535) Weight:  [62.4 kg] 62.4 kg (11/27 0535) Physical Exam  Constitutional:  oriented to person, place, and time. appears well-developed and well-nourished. No distress.  HENT: [place]/AT, PERRLA, no scleral icterus Mouth/Throat: Oropharynx is clear and moist. No oropharyngeal exudate. Poor dentition EPP:IRJJO knee wrapped, right and  left arm scattered ulcers, and 2 areas of draining abscess Abdominal: Soft. Bowel sounds are normal.  exhibits no distension. There is no tenderness.  Lymphadenopathy: no cervical adenopathy. No axillary adenopathy Neurological: alert and oriented to person, place, and time.  Skin: Skin is warm and dry. No rash noted. No erythema.  Psychiatric: a normal mood and affect.  behavior is normal.    Lab Results Recent Labs    09/16/23 1424 09/16/23 2047 09/17/23 0555  WBC  --  9.3 8.7  HGB 7.2* 6.7* 7.1*  HCT 23.0* 21.6* 23.1*  NA 140  --  141  K 4.1  --  4.4  CL 110  --  110  CO2 23  --  22  BUN 15  --  23*  CREATININE 0.57  --  0.77   Liver Panel Recent Labs    09/16/23 1424 09/17/23 0555  PROT 6.2* 6.1*  ALBUMIN 1.9* 1.8*  AST 148* 247*  ALT 126* 194*  ALKPHOS 135* 140*  BILITOT 0.7 0.5     Microbiology:  Studies/Results: ECHOCARDIOGRAM COMPLETE  Result Date: 09/15/2023    ECHOCARDIOGRAM REPORT   Patient Name:   Laurie [name] Date of Exam: 09/15/2023 Medical Rec #:  [id number]     Height:       63.0 in Accession #:    [id number]    Weight:  120.0 lb Date of Birth:  01-02-1996     BSA:          1.556 m Patient Age:    27 years      BP:           134/71 mmHg Patient Gender: F             HR:           69 bpm. Exam Location:  Inpatient Procedure: 2D Echo, Cardiac Doppler and Color Doppler Indications:    IVDU  History:        Patient has no prior history of Echocardiogram examinations.  Sonographer:    Vern Claude Referring Phys: [id number] SUBRINA SUNDIL IMPRESSIONS  1. Left ventricular ejection fraction, by estimation, is 55 to 60%. The left ventricle has normal function. The left ventricle has no regional wall motion abnormalities. Left ventricular diastolic parameters were normal.  2. Right ventricular systolic function is normal. The right ventricular size is normal.  3. The mitral valve is normal in structure. Trivial mitral valve regurgitation. No evidence of mitral  stenosis.  4. The aortic valve is normal in structure. Aortic valve regurgitation is not visualized. No aortic stenosis is present.  5. The inferior vena cava is normal in size with greater than 50% respiratory variability, suggesting right atrial pressure of 3 mmHg. FINDINGS  Left Ventricle: Left ventricular ejection fraction, by estimation, is 55 to 60%. The left ventricle has normal function. The left ventricle has no regional wall motion abnormalities. The left ventricular internal cavity size was normal in size. There is  no left ventricular hypertrophy. Left ventricular diastolic parameters were normal. Right Ventricle: The right ventricular size is normal. No increase in right ventricular wall thickness. Right ventricular systolic function is normal. Left Atrium: Left atrial size was normal in size. Right Atrium: Right atrial size was normal in size. Pericardium: There is no evidence of pericardial effusion. Mitral Valve: The mitral valve is normal in structure. Trivial mitral valve regurgitation. No evidence of mitral valve stenosis. Tricuspid Valve: The tricuspid valve is normal in structure. Tricuspid valve regurgitation is trivial. No evidence of tricuspid stenosis. Aortic Valve: The aortic valve is normal in structure. Aortic valve regurgitation is not visualized. No aortic stenosis is present. Aortic valve mean gradient measures 3.0 mmHg. Aortic valve peak gradient measures 6.0 mmHg. Aortic valve area, by VTI measures 1.64 cm. Pulmonic Valve: The pulmonic valve was normal in structure. Pulmonic valve regurgitation is trivial. No evidence of pulmonic stenosis. Aorta: The aortic root is normal in size and structure. Venous: The inferior vena cava is normal in size with greater than 50% respiratory variability, suggesting right atrial pressure of 3 mmHg. IAS/Shunts: No atrial level shunt detected by color flow Doppler.  LEFT VENTRICLE PLAX 2D LVIDd:         4.80 cm      Diastology LVIDs:         3.00 cm       LV e' medial:    11.40 cm/s LV PW:         0.70 cm      LV E/e' medial:  8.2 LV IVS:        0.50 cm      LV e' lateral:   11.50 cm/s LVOT diam:     1.80 cm      LV E/e' lateral: 8.1 LV SV:         40 LV SV Index:   26 LVOT Area:  2.54 cm  LV Volumes (MOD) LV vol d, MOD A2C: 91.2 ml LV vol d, MOD A4C: 117.0 ml LV vol s, MOD A2C: 33.8 ml LV vol s, MOD A4C: 48.6 ml LV SV MOD A2C:     57.4 ml LV SV MOD A4C:     117.0 ml LV SV MOD BP:      60.5 ml RIGHT VENTRICLE             IVC RV Basal diam:  3.70 cm     IVC diam: 1.80 cm RV Mid diam:    3.20 cm RV S prime:     12.10 cm/s TAPSE (M-mode): 2.8 cm LEFT ATRIUM             Index        RIGHT ATRIUM           Index LA Vol (A2C):   33.4 ml 21.46 ml/m  RA Area:     10.90 cm LA Vol (A4C):   30.6 ml 19.66 ml/m  RA Volume:   26.40 ml  16.96 ml/m LA Biplane Vol: 33.6 ml 21.59 ml/m  AORTIC VALVE                    PULMONIC VALVE AV Area (Vmax):    1.77 cm     PV Vmax:          0.93 m/s AV Area (Vmean):   1.60 cm     PV Peak grad:     3.5 mmHg AV Area (VTI):     1.64 cm     PR End Diast Vel: 1.74 msec AV Vmax:           122.00 cm/s AV Vmean:          81.600 cm/s AV VTI:            0.247 m AV Peak Grad:      6.0 mmHg AV Mean Grad:      3.0 mmHg LVOT Vmax:         85.00 cm/s LVOT Vmean:        51.200 cm/s LVOT VTI:          0.159 m LVOT/AV VTI ratio: 0.64  AORTA Ao Root diam: 2.40 cm Ao Asc diam:  2.30 cm MITRAL VALVE MV Area (PHT): 4.31 cm    SHUNTS MV Decel Time: 176 msec    Systemic VTI:  0.16 m MV E velocity: 93.50 cm/s  Systemic Diam: 1.80 cm MV A velocity: 44.30 cm/s MV E/A ratio:  2.11 Arvilla Meres MD Electronically signed by Arvilla Meres MD Signature Date/Time: 09/15/2023/2:42:46 PM    Final      Assessment/Plan: Group a strep bacteremia/SSTI/ right knee septic arthritis and concern for endocarditis given pulm nodules == continue on cefazolin 2gm IV Q8hr. Can stop linezolid after today and just keep on monotherapy with cefazolin  Given pulm  nodules = would like to have TEE to evaluate for endocarditis. And to help with length of treatment  SSTI = discussed with nursing for dressing changes. Forearm lesions appear improving. One subcut abscess may still need to IX D vs. Erupt. On imaging, it didn't appear well formed  Right knee septic arthritis = washed out. Drain removed.  DVT of upper right arm = defer to dr danford when to restart anticoagulation  Chronic hepatitis c = would explain transaminitis. HCV + VL elevated. Can check INR to see if has synthetic dysfunction .would like to  treat as outpatient after discharge   [facility] for Infectious Diseases Pager: [phone]  09/17/2023, 12:06 PM

## 2023-09-17 NOTE — TOC Initial Note (Signed)
Transition of Care [facility]) - Initial/Assessment Note    Patient Details  Name: Laurie [name] MRN: [id number] Date of Birth: 01/06/1996  [facility]) CM/SW Contact:    Otelia Santee, LCSW Phone Number: 09/17/2023, 11:05 AM  Clinical Narrative:                 TOC consulted for substance use resources/counseling.  Pt currently homeless and has been sleeping under a bridge with her spouse. Pt shares she and her spouse have been homeless for about a year now. They have not stayed at shelters and are not connected to any community agency for housing assistance. Pt is not employed and has no source of income. Pt does not received SNAP benefits and reports not applying for them. Pt is familiar with the bus system and states that this is typically her mode of transportation. Pt agreeable ot having resources and referrals made to community agency for assistance with food and housing.  CSW discussed interest in [place] resources for inpatient rehab. Pt reports she is not interested in inpatient rehab and is only interested in outpatient services. CSW offered to provide inpatient rehab resources should she decide she would like to go at a later time. Pt declined to have any inpatient resources provided to her and states she would only like outpatient resources. Outpatient resources have been added to pt's AVS.   Expected Discharge Plan: Homeless Shelter Barriers to Discharge: No Barriers Identified   Patient Goals and CMS Choice Patient states their goals for this hospitalization and ongoing recovery are:: To feel better CMS Medicare.gov Compare Post Acute Care list provided to:: Patient Choice offered to / list presented to : Patient [place] ownership interest in [facility].provided to::  (NA)    Expected Discharge Plan and Services In-house Referral: Clinical Social Work Discharge Planning Services: NA Post Acute Care Choice: NA Living arrangements for the past 2 months:  Homeless                 DME Arranged: N/A DME Agency: NA                  Prior Living Arrangements/Services Living arrangements for the past 2 months: Homeless Lives with:: Spouse Patient language and need for interpreter reviewed:: Yes Do you feel safe going back to the place where you live?: Yes      Need for Family Participation in Patient Care: No (Comment) Care giver support system in place?: No (comment) Current home services:  (None) Criminal Activity/Legal Involvement Pertinent to Current Situation/Hospitalization: No - Comment as needed  Activities of Daily Living   ADL Screening (condition at time of admission) Independently performs ADLs?: Yes (appropriate for developmental age) Is the patient deaf or have difficulty hearing?: No Does the patient have difficulty seeing, even when wearing glasses/contacts?: No Does the patient have difficulty concentrating, remembering, or making decisions?: No  Permission Sought/Granted   Permission granted to share information with : No              Emotional Assessment Appearance:: Appears stated age Attitude/Demeanor/Rapport: Engaged Affect (typically observed): Pleasant Orientation: : Oriented to [place], Oriented to Place, Oriented to  Time, Oriented to Situation Alcohol / Substance Use: Illicit Drugs (Fentanyl, heroin, cocaine, THC, methamphetamines) Psych Involvement: Yes (comment)  Admission diagnosis:  Abscess [L02.91] Sepsis (HCC) [A41.9] Acute pain of right knee [M25.561] Acute deep vein thrombosis (DVT) of other vein of right upper extremity (HCC) [I82.621] Patient Active Problem  List   Diagnosis Date Noted   Streptococcal bacteremia 09/16/2023   Sepsis (HCC) 09/15/2023   Abscess of upper extremity 09/15/2023   Septic arthritis of knee, right (HCC) 09/15/2023   Multiple lung nodules-secondary to septic emboli 09/15/2023   Acute deep vein thrombosis (DVT) of axillary vein of right upper extremity (HCC)  09/15/2023   Microcytic anemia 09/15/2023   Transaminitis 09/15/2023   IVDU (intravenous drug user) 09/15/2023   Thyroid nodule 09/15/2023   Tobacco dependence due to cigarettes 09/15/2023   Opiate use 09/15/2023   No prenatal care in current pregnancy 03/08/2020   History of cesarean section 03/08/2020   Status post repeat low transverse cesarean section 03/08/2020   Late prenatal care in third trimester 12/20/2019   Encounter for supervision of other normal pregnancy, unspecified trimester 12/20/2019   Gestational thrombocytopenia (HCC) 12/20/2016   Tobacco abuse 11/25/2016   Rh negative state in antepartum period 11/25/2016   Polysubstance abuse (HCC) 08/31/2015   Hepatitis C 03/22/2015   PCP:  Pcp, No Pharmacy:   [id number] [place], [place] - [address] AT [facility] [address] & [facility] [address] [place] [address] Phone: [phone] Fax: [phone]  [facility] [address] [place] [address] Phone: [phone] Fax: [phone]  [facility] #[id number] [place], [place] - [address] AT [facility] OF [address] & [address] [place] [address] Phone: [phone] Fax: [phone]     Social Determinants of Health (SDOH) Social History: SDOH Screenings   Food Insecurity: Food Insecurity Present (09/15/2023)  Housing: High Risk (09/17/2023)  Transportation Needs: Unmet Transportation Needs (09/15/2023)  Utilities: At Risk (09/15/2023)  Depression (PHQ2-9): Medium Risk (04/26/2020)  Financial Resource Strain: Low Risk  (06/04/2018)  Social Connections: Unknown (03/05/2022)   [facility]  Tobacco Use: High Risk (09/15/2023)   SDOH Interventions: Food Insecurity Interventions: Inpatient TOC, [id number] Referral, Other (Comment) (Resources added to AVS) Housing Interventions: Inpatient TOC, [id number] Referral, Other (Comment) (Resource  added to AVS) Transportation Interventions: Inpatient TOC, Other (Comment), Bus Pass Given (Resource added to AVS) Utilities Interventions: Intervention Not Indicated, Inpatient TOC, Other (Comment) (Pt currently homeless and does not have utiliites)   Readmission Risk Interventions    09/17/2023   11:03 AM  Readmission Risk Prevention Plan  Transportation Screening Complete  PCP or Specialist Appt within 5-7 Days Complete  Home Care Screening Complete  Medication Review (RN CM) Complete

## 2023-09-17 NOTE — Consult Note (Addendum)
[facility] Face-to-Face Psychiatry Consult   Reason for Consult: Suboxone induction/opiate use disorder Referring Physician: Dr. Jonathon Bellows Patient Identification: Laurie [name] MRN:  [id number] Principal Diagnosis: Sepsis [facility]) Diagnosis:  Principal Problem:   Sepsis (HCC) Active Problems:   Abscess of upper extremity   Septic arthritis of knee, right (HCC)   Multiple lung nodules-secondary to septic emboli   Acute deep vein thrombosis (DVT) of axillary vein of right upper extremity (HCC)   Microcytic anemia   Transaminitis   IVDU (intravenous drug user)   Thyroid nodule   Tobacco dependence due to cigarettes   Opiate use   Streptococcal bacteremia   Total Time spent with patient: 30 minutes  Subjective:   Laurie [name] is a 27 y.o. female patient admitted with cellulitis, septic arthritis, and septic emboli (CT confirmed).Patient has history of IVDU.  She has a 23 year old son who is with her mother. She is unemployed at this time. She report psychiatric history of depression and anxiety that was previously managed with Wellbutrin. Patient reports using about 1gr of fentanyl up to two times a day, when unable to get fentanyl she uses heroin. She reports intermittent episodes of methamphetamine use and no knowing intention of xylazine use. Psych consulted for assistance with opiate use disorder and suboxone induction.   Patient is seen and assess by this psychiatric nurse practitioner. She is observed to be sleeping in the bed however awakes and communicates with this provider. She endorses moderate relief of withdrawal symptoms since increasing her suboxone dosage yesterday.She is currently taking suboxone 8-2mg  po TID. She has not received any additional prns. She reports that her symptoms are very minimal and being well managed. She only endorses sweats. She is resting comfortably in bed and does not appear to be in any pain or discomfort. She is not displaying any restlessness and reports having good  sleep last night. SHe has not eaten breakfast, however has no changes in her appetite at this time. She continues to deny any suicidal ideations, homicidal ideations and or halluciantions. At this time she does not appear to be of imminent danger to [place] or others.   The patient was initially seen for IV drug use and Suboxone management. She has been started on Suboxone and denies acute psychiatric symptoms at this time. Management will be transferred back to the primary team.  The patient is encouraged to seek an inpatient rehab bed to ensure a successful transition from medical care to rehab and to prevent relapse. A TOC consult will be needed to assist in identifying available facilities. If the patient declines rehab, she is eligible for ongoing management at [facility] or [facility].  Psychiatric consultation will be closed at this time, as there is no need for continued inpatient follow-up.  HPI:  Laurie [name] is a 27 y.o. female who presents to the Emergency Department complaining of sick.  She presents to the emergency for feeling sick.  She states that she is on her arms 3 weeks ago and they started popping over the last few days.  5 days ago she started feeling sick and unwell with central chest pain and feeling tight and heavy with difficulty breathing.  She does not have a cough.  She reports 2 days of subjective fevers.  She reports bilateral knee pain over the last day but now she is unable to bear weight on her right knee secondary to severe pain.   Past Psychiatric History: Depression and anxiety. Previous medications included Wellbutrin.  SHe reports ongoing use of illicit substances for the past 10 years. She denies history of NSSIB, suicide attempts, suicidal ideations and or suicidal thoughts. She recently incarcerated in July 2024. She endorses history of illicit substances and 1ppd cigarette since age 85.   Risk to [place]:   Denies Risk to Others:   Denies Prior Inpatient Therapy:   Denies  admission. Reports history of inpatient rehab admission in 2018 for methamphetamines.  Prior Outpatient Therapy:  Denies current has been to [facility] and [facility] stop .   Past Medical History:  Past Medical History:  Diagnosis Date   Anxiety    Asthma    "grew out"   Chlamydia    Depression    doing really good   Gestational diabetes    with first two pregnancies   Hepatitis C 03/2015   History of heroin abuse (HCC)    Homelessness    Infection    UTI   Polysubstance abuse (HCC) 05/28/2018   UDS positive for cocaine, MJ, meth, opioids   Type A blood, Rh negative     Past Surgical History:  Procedure Laterality Date   CESAREAN SECTION N/A 01/11/2016   Procedure: CESAREAN SECTION;  Surgeon: Brock Bad, MD;  Location: [facility];  Service: Obstetrics;  Laterality: N/A;   CESAREAN SECTION N/A 12/21/2016   Procedure: REPEAT CESAREAN SECTION;  Surgeon: Adam Phenix, MD;  Location: [facility] BIRTHING SUITES;  Service: Obstetrics;  Laterality: N/A;   CESAREAN SECTION N/A 06/05/2018   Procedure: REPEAT CESAREAN SECTION;  Surgeon: [name], MD;  Location: [facility] BIRTHING SUITES;  Service: Obstetrics;  Laterality: N/A;   CESAREAN SECTION WITH BILATERAL TUBAL LIGATION Bilateral 03/08/2020   Procedure: CESAREAN SECTION WITH BILATERAL TUBAL LIGATION;  Surgeon: Kathrynn Running, MD;  Location: [facility] OR[facility];  Service: Obstetrics;  Laterality: Bilateral;   IRRIGATION AND DEBRIDEMENT KNEE Right 09/15/2023   Procedure: IRRIGATION AND DEBRIDEMENT KNEE;  Surgeon: Joen Laura, MD;  Location: [facility];  Service: Orthopedics;  Laterality: Right;   Family History:  Family History  Problem Relation Age of Onset   Diabetes Mother    Pulmonary embolism Mother    Stroke Mother    Cancer Paternal Grandmother        liver   Epilepsy Brother    Family Psychiatric  History: Denies Social History:  Social History   Substance and Sexual Activity  Alcohol Use Not Currently   Comment: occasionally      Social History   Substance and Sexual Activity  Drug Use Not Currently   Types: Marijuana, Heroin, Other-see comments   Comment: 05/28/18 +UDS for meth, cocaine, opioids, MJ;  heroin 3 years ago    Social History   Socioeconomic History   Marital status: Single    Spouse name: Not on file   Number of children: 2   Years of education: Not on file   Highest education level: Not on file  Occupational History   Not on file  Tobacco Use   Smoking status: Some Days    Current packs/day: 0.25    Types: Cigarettes   Smokeless tobacco: Never   Tobacco comments:    4 cigs per day  Vaping Use   Vaping status: Never Used  Substance and Sexual Activity   Alcohol use: Not Currently    Comment: occasionally   Drug use: Not Currently    Types: Marijuana, Heroin, Other-see comments    Comment: 05/28/18 +UDS for meth, cocaine, opioids, MJ;  heroin 3 years ago   Sexual activity: Yes    Birth control/protection: None  Other Topics Concern   Not on file  Social History Narrative   Not on file   Social Determinants of Health   Financial Resource Strain: Low Risk  (06/04/2018)   Overall Financial Resource Strain (CARDIA)    Difficulty of Paying Living Expenses: Not hard at all  Food Insecurity: Food Insecurity Present (09/15/2023)   Hunger Vital Sign    Worried About Running Out of Food in the Last Year: Sometimes true    Ran Out of Food in the Last Year: Sometimes true  Transportation Needs: Unmet Transportation Needs (09/15/2023)   PRAPARE - Administrator, Civil Service (Medical): Yes    Lack of Transportation (Non-Medical): Yes  Physical Activity: Not on file  Stress: Not on file  Social Connections: Unknown (03/05/2022)   Received from [facility]   Social Network    Social Network: Not on file   Additional Social History:    Allergies:   Allergies  Allergen Reactions   Keflex [Cephalexin] Hives   Penicillins Other (See Comments)    Results  thru an allergy skin test Has patient had a PCN reaction causing immediate rash, facial/tongue/throat swelling, SOB or lightheadedness with hypotension: No Has patient had a PCN reaction causing severe rash involving mucus membranes or skin necrosis: No Has patient had a PCN reaction that required hospitalization No Has patient had a PCN reaction occurring within the last 10 years: No If all of the above answers are "NO", then may proceed with Cephalosporin use.      Labs:  Results for orders placed or performed during the hospital encounter of 09/14/23 (from the past 48 hour(s))  Hemoglobin and hematocrit, blood     Status: Abnormal   Collection Time: 09/15/23  1:19 PM  Result Value Ref Range   Hemoglobin 7.2 (L) 12.0 - 15.0 g/dL   HCT 29.5 (L) 62.1 - 30.8 %    Comment: Performed at [facility], [address] [place], [place] [address]  Aerobic/Anaerobic Culture w Gram Stain (surgical/deep wound)     Status: None (Preliminary result)   Collection Time: 09/15/23  4:12 PM   Specimen: PATH Cytology Misc. fluid; Body Fluid  Result Value Ref Range   Specimen Description      KNEE Performed at [facility], [address] [place], [place] [address]    Special Requests      RIGHT Performed at [facility], [address] [place], [place] [address]    Gram Stain      FEW WBC PRESENT,BOTH PMN AND MONONUCLEAR NO ORGANISMS SEEN    Culture      NO GROWTH 1 DAY Performed at [facility], [address] [place], [place] [address]    Report Status PENDING   Hemoglobin and hematocrit, blood     Status: Abnormal   Collection Time: 09/15/23  6:44 PM  Result Value Ref Range   Hemoglobin 7.0 (L) 12.0 - 15.0 g/dL   HCT 10.2 (L) 72.5 - 36.6 %    Comment: Performed at [facility], [address], [place], [place] [address]  Comprehensive metabolic panel     Status: Abnormal   Collection Time: 09/16/23   2:24 PM  Result Value Ref Range   Sodium 140 135 - 145 mmol/L   Potassium 4.1 3.5 - 5.1 mmol/L   Chloride 110  98 - 111 mmol/L   CO2 23 22 - 32 mmol/L   Glucose, Bld 174 (H) 70 - 99 mg/dL    Comment: Glucose reference range applies only to samples taken after fasting for at least 8 hours.   BUN 15 6 - 20 mg/dL   Creatinine, Ser 3.47 0.44 - 1.00 mg/dL   Calcium 8.0 (L) 8.9 - 10.3 mg/dL   Total Protein 6.2 (L) 6.5 - 8.1 g/dL   Albumin 1.9 (L) 3.5 - 5.0 g/dL   AST 425 (H) 15 - 41 U/L   ALT 126 (H) 0 - 44 U/L   Alkaline Phosphatase 135 (H) 38 - 126 U/L   Total Bilirubin 0.7 <1.2 mg/dL   GFR, Estimated >95 >63 mL/min    Comment: (NOTE) Calculated using the CKD-EPI Creatinine Equation (2021)    Anion gap 7 5 - 15    Comment: Performed at [facility], [address], [place], [place] [address]  Hemoglobin and hematocrit, blood     Status: Abnormal   Collection Time: 09/16/23  2:24 PM  Result Value Ref Range   Hemoglobin 7.2 (L) 12.0 - 15.0 g/dL   HCT 33.2 (L) 95.1 - 88.4 %    Comment: Performed at [facility], [address] [place], [place] [address]  Vitamin B12     Status: Abnormal   Collection Time: 09/16/23  2:24 PM  Result Value Ref Range   Vitamin B-12 1,309 (H) 180 - 914 pg/mL    Comment: (NOTE) This assay is not validated for testing neonatal or myeloproliferative syndrome specimens for Vitamin B12 levels. Performed at [facility], [address], [place], [place] [address]   Folate     Status: None   Collection Time: 09/16/23  2:24 PM  Result Value Ref Range   Folate 10.8 >5.9 ng/mL    Comment: Performed at [facility], [address] [place], [place] [address]  Iron and TIBC     Status: Abnormal   Collection Time: 09/16/23  2:24 PM  Result Value Ref Range   Iron 66 28 - 170 ug/dL   TIBC 355 (L) 732 - 202 ug/dL   Saturation Ratios 31 10.4 - 31.8 %   UIBC 145 ug/dL    Comment:  Performed at [facility], [address] [place], [place] [address]  Ferritin     Status: None   Collection Time: 09/16/23  2:24 PM  Result Value Ref Range   Ferritin 229 11 - 307 ng/mL    Comment: Performed at [facility], [address] [place], [place] [address]  Reticulocytes     Status: Abnormal   Collection Time: 09/16/23  2:24 PM  Result Value Ref Range   Retic Ct Pct 0.4 0.4 - 3.1 %    Comment: REPEATED TO VERIFY   RBC. 2.90 (L) 3.87 - 5.11 MIL/uL   Retic Count, Absolute Not Measured 19.0 - 186.0 K/uL   Immature Retic Fract Not Measured 2.3 - 15.9 %    Comment: Performed at [facility], [address], [place], [place] [address]  CBC     Status: Abnormal   Collection Time: 09/16/23  8:47 PM  Result Value Ref Range   WBC 9.3 4.0 - 10.5 K/uL   RBC 2.78 (L) 3.87 - 5.11 MIL/uL   Hemoglobin 6.7 (LL) 12.0 - 15.0 g/dL    Comment: REPEATED TO VERIFY Reticulocyte Hemoglobin testing may be clinically  indicated, consider ordering this additional test [id number] THIS CRITICAL RESULT HAS VERIFIED AND BEEN CALLED TO M.RINGLER,RN BY ATCHISON,MARY ON 11 26 2024 AT 2112, AND HAS BEEN READ BACK.     HCT 21.6 (L) 36.0 - 46.0 %   MCV 77.7 (L) 80.0 - 100.0 fL   MCH 24.1 (L) 26.0 - 34.0 pg   MCHC 31.0 30.0 - 36.0 g/dL   RDW 54.0 (H) 98.1 - 19.1 %   Platelets 238 150 - 400 K/uL   nRBC 0.0 0.0 - 0.2 %    Comment: Performed at [facility], [address] [place], [place] [address]  Prepare RBC (crossmatch)     Status: None   Collection Time: 09/16/23  9:27 PM  Result Value Ref Range   Order Confirmation      ORDER PROCESSED BY BLOOD BANK Performed at [facility], [address] [place], [place] [address]   Comprehensive metabolic panel     Status: Abnormal   Collection Time: 09/17/23  5:55 AM  Result Value Ref Range   Sodium 141 135 - 145 mmol/L   Potassium 4.4 3.5 - 5.1 mmol/L    Chloride 110 98 - 111 mmol/L   CO2 22 22 - 32 mmol/L   Glucose, Bld 164 (H) 70 - 99 mg/dL    Comment: Glucose reference range applies only to samples taken after fasting for at least 8 hours.   BUN 23 (H) 6 - 20 mg/dL   Creatinine, Ser 0.86 0.44 - 1.00 mg/dL   Calcium 8.1 (L) 8.9 - 10.3 mg/dL   Total Protein 6.1 (L) 6.5 - 8.1 g/dL   Albumin 1.8 (L) 3.5 - 5.0 g/dL   AST 578 (H) 15 - 41 U/L   ALT 194 (H) 0 - 44 U/L   Alkaline Phosphatase 140 (H) 38 - 126 U/L   Total Bilirubin 0.5 <1.2 mg/dL   GFR, Estimated >46 >96 mL/min    Comment: (NOTE) Calculated using the CKD-EPI Creatinine Equation (2021)    Anion gap 9 5 - 15    Comment: Performed at [facility], [address], [place], [place] [address]  CBC with Differential/Platelet     Status: Abnormal   Collection Time: 09/17/23  5:55 AM  Result Value Ref Range   WBC 8.7 4.0 - 10.5 K/uL   RBC 2.93 (L) 3.87 - 5.11 MIL/uL   Hemoglobin 7.1 (L) 12.0 - 15.0 g/dL   HCT 41.3 (L) 24.4 - 01.0 %   MCV 78.8 (L) 80.0 - 100.0 fL   MCH 24.2 (L) 26.0 - 34.0 pg   MCHC 30.7 30.0 - 36.0 g/dL   RDW 27.2 (H) 53.6 - 64.4 %   Platelets 248 150 - 400 K/uL   nRBC 0.0 0.0 - 0.2 %   Neutrophils Relative % 63 %   Neutro Abs 5.5 1.7 - 7.7 K/uL   Lymphocytes Relative 28 %   Lymphs Abs 2.4 0.7 - 4.0 K/uL   Monocytes Relative 8 %   Monocytes Absolute 0.7 0.1 - 1.0 K/uL   Eosinophils Relative 0 %   Eosinophils Absolute 0.0 0.0 - 0.5 K/uL   Basophils Relative 0 %   Basophils Absolute 0.0 0.0 - 0.1 K/uL   Immature Granulocytes 1 %   Abs Immature Granulocytes 0.07 0.00 - 0.07 K/uL    Comment: Performed at [facility], [address], [place], [place] [address]    Current Facility-Administered Medications  Medication Dose Route Frequency  Provider Last Rate Last Admin   acetaminophen (TYLENOL) tablet 650 mg  650 mg Oral Q6H PRN Joen Laura, MD   650 mg at 09/15/23 1208   Or   acetaminophen (TYLENOL)  suppository 650 mg  650 mg Rectal Q6H PRN Joen Laura, MD       buprenorphine-naloxone (SUBOXONE) 8-2 mg per SL tablet 1 tablet  1 tablet Sublingual TID Maryagnes Amos, FNP   1 tablet at 09/17/23 0946   ceFAZolin (ANCEF) IVPB 2g/100 mL premix  2 g Intravenous Q8H Kc, Dayna Barker, MD 200 mL/hr at 09/17/23 0556 2 g at 09/17/23 0556   Chlorhexidine Gluconate Cloth 2 % PADS 6 each  6 each Topical Daily Joen Laura, MD   6 each at 09/17/23 1610   cloNIDine (CATAPRES) tablet 0.1 mg  0.1 mg Oral QID Maryagnes Amos, FNP   0.1 mg at 09/17/23 9604   Followed by   Melene Muller ON 09/18/2023] cloNIDine (CATAPRES) tablet 0.1 mg  0.1 mg Oral BH-qamhs Starkes-Perry, Juel Burrow, FNP       Followed by   Melene Muller ON 09/20/2023] cloNIDine (CATAPRES) tablet 0.1 mg  0.1 mg Oral QAC breakfast Starkes-Perry, Juel Burrow, FNP       dicyclomine (BENTYL) tablet 20 mg  20 mg Oral Q6H PRN Maryagnes Amos, FNP       ferrous sulfate tablet 325 mg  325 mg Oral Q breakfast Joen Laura, MD   325 mg at 09/17/23 0947   hydrOXYzine (ATARAX) tablet 25 mg  25 mg Oral Q6H PRN Maryagnes Amos, FNP   25 mg at 09/17/23 5409   ketorolac (TORADOL) 15 MG/ML injection 15 mg  15 mg Intravenous Q6H PRN Joen Laura, MD   15 mg at 09/17/23 0606   linezolid (ZYVOX) tablet 600 mg  600 mg Oral Q12H Herby Abraham, RPH   600 mg at 09/17/23 8119   loperamide (IMODIUM) capsule 2-4 mg  2-4 mg Oral PRN Maryagnes Amos, FNP       methocarbamol (ROBAXIN) tablet 500 mg  500 mg Oral Q8H PRN Starkes-Perry, Juel Burrow, FNP       naproxen (NAPROSYN) tablet 500 mg  500 mg Oral BID PRN Maryagnes Amos, FNP       nicotine (NICODERM CQ - dosed in mg/24 hours) patch 21 mg  21 mg Transdermal Daily Joen Laura, MD   21 mg at 09/17/23 0949   nicotine polacrilex (NICORETTE) gum 2 mg  2 mg Oral PRN Maryagnes Amos, FNP   2 mg at 09/17/23 0607   ondansetron (ZOFRAN) tablet 4 mg  4 mg Oral Q6H PRN  Joen Laura, MD       Or   ondansetron (ZOFRAN) injection 4 mg  4 mg Intravenous Q6H PRN Joen Laura, MD       Oral care mouth rinse  15 mL Mouth Rinse 4 times per day Joen Laura, MD   15 mL at 09/17/23 0948   Oral care mouth rinse  15 mL Mouth Rinse PRN Joen Laura, MD       [START ON 09/18/2023] pneumococcal 20-valent conjugate vaccine (PREVNAR 20) injection 0.5 mL  0.5 mL Intramuscular Tomorrow-1000 Osvaldo Shipper, MD       senna-docusate (Senokot-S) tablet 1 tablet  1 tablet Oral QHS PRN Joen Laura, MD       sodium chloride flush (NS) 0.9 % injection 3 mL  3 mL Intravenous Q12H Marchwiany,  Clarisse Gouge, MD   3 mL at 09/17/23 0947   sodium chloride flush (NS) 0.9 % injection 3 mL  3 mL Intravenous Q12H Joen Laura, MD   3 mL at 09/17/23 0947   sodium chloride flush (NS) 0.9 % injection 3 mL  3 mL Intravenous PRN Joen Laura, MD        Musculoskeletal: Strength & Muscle Tone: within normal limits Gait & Station: normal Patient leans: N/A            Psychiatric Specialty Exam:  Presentation  General Appearance:  Casual  Eye Contact: Fair  Speech: Clear and Coherent; Normal Rate  Speech Volume: Normal  Handedness: Right   Mood and Affect  Mood: Depressed; Anxious; Irritable  Affect: Appropriate; Congruent   Thought Process  Thought Processes: Coherent  Descriptions of Associations:Intact  Orientation:Full (Time, Place and Person)  Thought Content:WDL  History of Schizophrenia/Schizoaffective disorder:No data recorded Duration of Psychotic Symptoms:No data recorded Hallucinations:Hallucinations: None  Ideas of Reference:None  Suicidal Thoughts:Suicidal Thoughts: No  Homicidal Thoughts:Homicidal Thoughts: No   Sensorium  Memory: Immediate Fair; Recent Good  Judgment: Fair  Insight: Fair   Art therapist  Concentration: Good  Attention  Span: Good  Recall: Good  Fund of Knowledge: Good  Language: Good   Psychomotor Activity  Psychomotor Activity: Psychomotor Activity: Normal   Assets  Assets: Communication Skills; Desire for Improvement; Housing; Resilience; Social Support   Sleep  Sleep: Sleep: Fair   Physical Exam: Physical Exam Vitals and nursing note reviewed.  Constitutional:      General: She is awake.     Appearance: She is normal weight.  HENT:     Mouth/Throat:     Dentition: Abnormal dentition. Dental caries present.  Skin:    General: Skin is warm.     Findings: Abscess and wound (multiple, necrotic ulcers noted to L dorsal aspect of wrist) present.  Neurological:     General: No focal deficit present.     Mental Status: She is alert and oriented to person, place, and time.  Psychiatric:        Mood and Affect: Mood is anxious.        Behavior: Behavior normal. Behavior is cooperative.    Review of Systems  Psychiatric/Behavioral:  Positive for substance abuse (fentanyl, heroin, and meth).   All other systems reviewed and are negative.  Blood pressure (!) 123/53, pulse (!) 55, temperature 98 F (36.7 C), temperature source Oral, resp. rate 20, weight 62.4 kg, last menstrual period 07/29/2023, SpO2 99%, unknown if currently breastfeeding. Body mass index is 24.37 kg/m.    Medical Decision Making:  The patient is currently undergoing Suboxone induction for opioid use disorder, with moderate relief of withdrawal symptoms after an increase in her Suboxone dosage. She is resting comfortably in bed with minimal withdrawal symptoms, primarily sweating. She denies any acute psychiatric symptoms, including suicidal or homicidal ideations, and appears to be in stable condition at this time. Regarding her physical condition, although her hemoglobin is noted to be low at 6.7, no immediate physical complaints have been reported, and her clinical presentation does not suggest imminent danger.  The patient remains stable with no signs of acute psychiatric decompensation. Continued Suboxone treatment and monitoring will continue as she is transitioned back to the primary team for further care and management of her opioid use disorder.   Treatment Plan Summary: Daily contact with patient to assess and evaluate symptoms and progress in treatment, Medication management, and Plan  Due to the complexity of illicit substance use to include suspected xylazine, fentanyl, heroin will require careful consideration for precipitated withdrawal and management of symptoms will require perfect timing.     Suspect Xylazine use clinical findings consistent with use (peripheral edema, necrotic ulcers, abscesses, skin tracks, and skin infections).  -Will continue Clonidine detox(BP ok at this time).  -Will increase Suboxone 8-2 TID.  Would prescribe no more than 7day supply of Suboxone at discharge. She will need to follow up outpatient.  -Will continue NRT.  -Continue with limited visitor policy to prevent contraband exposure.  -TOC referral for inpatient rehab, patient in action phase. If patient declines rehab, she is eligible for [facility] or [facility]. -Does not meet criteria for IVC.   Labs reviewed and assessed to include urine drug screen positive for amphetamines, opiates, cocaine, THC.  White blood count 11.0 hemoglobin 7.2, hematocrit 23.2, elevated alkaline phosphatase 135, low albumin 2.5, elevated bilirubin 1.7, and elevated ALT 127.  CT scan shows scattered nodular opacities in the lungs bilaterally consistent with infectious, inflammatory, and or neoplastic.  Disposition: No evidence of imminent risk to [place] or others at present.   Patient does not meet criteria for psychiatric inpatient admission. Supportive therapy provided about ongoing stressors. Refer to IOP. Discussed crisis plan, support from social network, calling 911, coming to the Emergency  Department, and calling Suicide Hotline.    Maryagnes Amos, FNP 09/17/2023 10:18 AM

## 2023-09-17 NOTE — Plan of Care (Signed)

## 2023-09-17 NOTE — Progress Notes (Signed)
Still waiting for provider to explain blood products transfusion for pt consent.

## 2023-09-17 NOTE — Discharge Instructions (Addendum)
Orthopedic Discharge Instructions  Diet: As you were doing prior to hospitalization   Shower:  May shower but keep the wounds dry, use an occlusive plastic wrap, NO SOAKING IN TUB.  If the bandage gets wet, change with a clean dry gauze.  If you have a splint on, leave the splint in place and keep the splint dry with a plastic bag.  Dressing:  You may change your dressing 3-5 days after surgery.  If the dressing remains clean and dry it can also be left on until follow up.  If you change the dressing replace with clean gauze and tape or ace wrap.  For most surgeries, the stitches used are dissolvable and don't need to be removed.  However, depending on your surgery, you may have stitches that will need to be removed in the office in about 2 weeks.    Activity:  Increase activity slowly as tolerated, but follow the weight bearing instructions below.  Do not drive for the next 2-4 weeks.  In addition, you cannot be taking narcotics while you drive, and you must feel in control of the vehicle.    Weight Bearing:   You may bear weight on your surgical leg as tolerated.  Continue working well with physical therapy.  Blood clot treatment: Continue taking your Eliquis as prescribed from the hospitalist during your encounter here at the hospital, for treatment of your deep vein thrombosis (DVT, or blood clot). Be cautious for signs of a pulmonary embolus (blood clot in the lungs) which can include sudden short of breath, feeling lightheaded or dizzy, chest pain with a deep breath, rapid pulse rapid breathing.  Signs of new blood clot(s) in your arms or legs include new unexplained swelling and cramping, warm, red or darkened skin around the painful area.  Please call the office or 911 right away if these signs or symptoms develop.  To prevent constipation: you may use a stool softener such as - Colace (over the counter) 100 mg by mouth twice a day  Drink plenty of fluids (prune juice may be helpful) and high  fiber foods Miralax (over the counter) for constipation as needed.    Itching:  If you experience itching with your medications, try taking only a single pain pill, or even half a pain pill at a time.  You may take up to 10 pain pills per day, and you can also use benadryl over the counter for itching or also to help with sleep.   Precautions:  If you experience chest pain or shortness of breath - call 911 immediately for transfer to the hospital emergency department!!  Call office [phone]) for the following: Temperature greater than 101F Persistent nausea and vomiting Severe uncontrolled pain Redness, tenderness, or signs of infection (pain, swelling, redness, odor or green/yellow discharge around the site) Difficulty breathing, headache or visual disturbances Hives Persistent dizziness or light-headedness Extreme fatigue Any other questions or concerns you may have after discharge  In an emergency, call 911 or go to an Emergency Department at a nearby hospital  Follow- Up Appointment:  Please call for an appointment to be seen approximately 2-3 week after surgery in [facility] with your surgeon Dr. Weber Cooks - [phone] Address: [address], [place], [place] [address]      Information on my medicine - XARELTO (rivaroxaban)  This medication education was reviewed with me or my healthcare representative as part of my discharge preparation.   WHY WAS XARELTO PRESCRIBED FOR YOU? Xarelto  was prescribed to treat blood clots that may have been found in the veins of your legs (deep vein thrombosis) or in your lungs (pulmonary embolism) and to reduce the risk of them occurring again.  What do you need to know about Xarelto? The starting dose is one 15 mg tablet taken TWICE daily with food for the FIRST 21 DAYS then on 10/08/2023 the dose is changed to one 20 mg tablet taken ONCE A DAY with your evening meal.  DO NOT stop taking Xarelto without talking to  the health care provider who prescribed the medication.  Refill your prescription for 20 mg tablets before you run out.  After discharge, you should have regular check-up appointments with your healthcare provider that is prescribing your Xarelto.  In the future your dose may need to be changed if your kidney function changes by a significant amount.  What do you do if you miss a dose? If you are taking Xarelto TWICE DAILY and you miss a dose, take it as soon as you remember. You may take two 15 mg tablets (total 30 mg) at the same time then resume your regularly scheduled 15 mg twice daily the next day.  If you are taking Xarelto ONCE DAILY and you miss a dose, take it as soon as you remember on the same day then continue your regularly scheduled once daily regimen the next day. Do not take two doses of Xarelto at the same time.   Important Safety Information Xarelto is a blood thinner medicine that can cause bleeding. You should call your healthcare provider right away if you experience any of the following: Bleeding from an injury or your nose that does not stop. Unusual colored urine (red or dark brown) or unusual colored stools (red or black). Unusual bruising for unknown reasons. A serious fall or if you hit your head (even if there is no bleeding).  Some medicines may interact with Xarelto and might increase your risk of bleeding while on Xarelto. To help avoid this, consult your healthcare provider or pharmacist prior to using any new prescription or non-prescription medications, including herbals, vitamins, non-steroidal anti-inflammatory drugs (NSAIDs) and supplements.  This website has more information on Xarelto: [email or web address] PANTRY [employer] [address] [phone]  [facility] [employer] [address] [phone]  [facility] [employer] [address] [place] [phone]  [facility]  [facility] [address] [phone]  [facility] [address] [place], [place] [address] [phone]

## 2023-09-17 NOTE — Plan of Care (Signed)
Plan of Care reviewed.

## 2023-09-17 NOTE — Progress Notes (Signed)
  Progress Note   Patient: Laurie [name] [id number]:[id number] DOB: 24-Jul-1996 DOA: 09/14/2023     2 DOS: the patient was seen and examined on 09/17/2023 at 10:50AM      Brief hospital course: 27 y.o. F with hx IVDA and polysubstance abuse, hx familial factor V Leiden, smoking, PTSD and chronic hep C who presented with malaise, chest discomfort, draining arm wounds and right knee swelling.   Found to have group A strep bacteremia, pulmonary nodules, right arm DVT, right knee septic arthritis.     Assessment and Plan: Group A strep bacteremia Concern for endocarditis -Continue cefazolin - TEE next Monday -Consult ID, appreciate expertise   Right knee septic arthritis Status post washout, drain removed today. - Consult orthopedics, appreciate cares - PT/OT  Pulmonary nodules Etiology unclear, have to suspect tricuspid endocarditis - TEE pending  Right upper extremity DVT  Ortho have cleared for resumption of anticoagulation Suspect abnormal ATIII and protein C/S function are from liver disease, maybe smoldering DIC. - Start Xarelto - Repeat hypercoagulable panel in 3 months off anticoagulation  Anemia, microcytic Has had microcytic anemia in the past in the setting of pregnancy, vaginal bleeding, and cesarean section.  Reports no recent clinical bleeding anywhere.  Reticulocyte index hypoproliferative but mineral indices normal.  Suspect this is reduced production in setting of infection. - Trend Hgb - Transfusion threshold 7 g/dL  IVDU - Consult TOC and psychiatry - Continue suboxone  Thyroid nodule - Nonemergent outpatient [place]  Hepatitis C Transaminitis worsening.   INR 1.4 on admission - Follow LFTs - Obtain repeat INR        Subjective: Patient is gradually feeling better, she had very scant bloody drainage from her knee drained before was removed, no other bleeding, no fever, no respiratory symptoms, no confusion.  No other new focal skin findings or joint  pains.     Physical Exam: BP (!) 128/57 (BP Location: Left Leg)   Pulse 69   Temp 98 F (36.7 C) (Oral)   Resp 16   Wt 62.4 kg   LMP 07/29/2023 (Approximate) Comment: Negative urine pregnancy test 09-14-2023                     None in November 2024 yet.  SpO2 99%   BMI 24.37 kg/m   Adult female, lying in bed, interactive and appropriate RRR, no murmurs, no peripheral edema Respiratory rate seems normal, lungs clear without rales or wheezes Abdomen soft no tenderness palpation or guarding Attention normal, affect normal judgment appear normal There are numerous ulcerated wounds on her bilateral arms, he has ability healing well, none of them are draining or have significant residual surrounding redness and edema except for 1 which is somewhat fluctuant on the right forearm.     Data Reviewed: Discussed with infectious disease LFTs trending up Hemoglobin up to 7.1, microcytic Iron studies unremarkable, B12 normal   Family Communication: None present    Disposition: Status is: Inpatient         Author: Alberteen Sam, MD 09/17/2023 5:17 PM  For on call review [email or web address].

## 2023-09-17 NOTE — Progress Notes (Signed)
Spiritual Care Note  Attempted visit per consult for spiritual assessment, but Laurie [name] was sleepy and resting. She is aware of ongoing Spiritual Care availability, and we hope to follow up later in the day when she might feel more able to engage.   Chaplain Rush Barer, MDiv, [facility] 24/7 pager [phone]

## 2023-09-17 NOTE — Progress Notes (Signed)
  Progress Note   Patient: Laurie [name] [id number] DOB: 1995/12/29 DOA: 09/14/2023     2 DOS: the patient was seen and examined on 09/17/2023 at 10:50AM      Brief hospital course: 27 y.o. F with hx IVDA and polysubstance abuse, hx familial factor V Leiden, smoking, PTSD and chronic hep C who presented with malaise, chest discomfort, draining arm wounds and right knee swelling.   Found to have group A strep bacteremia, pulmonary nodules, right arm DVT, right knee septic arthritis.     Assessment and Plan: Sepsis due to group A strep bacteremia -Continue cefazolin - TEE next Monday -Consult ID, appreciate expertise   Right knee septic arthritis Status post washout, drain removed today. - Consult orthopedics, appreciate cares - PT/OT  Pulmonary nodules Etiology unclear, have to suspect tricuspid endocarditis - TEE pending  Right upper extremity DVT  Ortho have cleared for resumption of anticoagulation Suspect abnormal ATIII and protein C/S function are from liver disease, maybe smoldering DIC. - Start Xarelto - Repeat hypercoagulable panel in 3 months off anticoagulation  Anemia, microcytic Has had microcytic anemia in the past in the setting of pregnancy, vaginal bleeding, and cesarean section.  Reports no recent clinical bleeding anywhere.  Reticulocyte index hypoproliferative but mineral indices normal.  Suspect this is reduced production in setting of infection. - Trend Hgb - Transfusion threshold 7 g/dL  IVDU - Consult TOC and psychiatry - Continue suboxone  Thyroid nodule - Nonemergent outpatient [place]  Hepatitis C Transaminitis worsening.   INR 1.4 on admission - Follow LFTs - Obtain repeat INR        Subjective: Patient is gradually feeling better, she had very scant bloody drainage from her knee drained before was removed, no other bleeding, no fever, no respiratory symptoms, no confusion.  No other new focal skin findings or joint  pains.     Physical Exam: BP (!) 128/57 (BP Location: Left Leg)   Pulse 69   Temp 98 F (36.7 C) (Oral)   Resp 16   Wt 62.4 kg   LMP 07/29/2023 (Approximate) Comment: Negative urine pregnancy test 09-14-2023                     None in November 2024 yet.  SpO2 99%   BMI 24.37 kg/m   Adult female, lying in bed, interactive and appropriate RRR, no murmurs, no peripheral edema Respiratory rate seems normal, lungs clear without rales or wheezes Abdomen soft no tenderness palpation or guarding Attention normal, affect normal judgment appear normal There are numerous ulcerated wounds on her bilateral arms, he has ability healing well, none of them are draining or have significant residual surrounding redness and edema except for 1 which is somewhat fluctuant on the right forearm.     Data Reviewed: Discussed with infectious disease LFTs trending up Hemoglobin up to 7.1, microcytic Iron studies unremarkable, B12 normal   Family Communication: None present    Disposition: Status is: Inpatient         Author: Alberteen Sam, MD 09/17/2023 3:13 PM  For on call review [email or web address].

## 2023-09-18 DIAGNOSIS — F1990 Other psychoactive substance use, unspecified, uncomplicated: Secondary | ICD-10-CM | POA: Diagnosis not present

## 2023-09-18 DIAGNOSIS — R7881 Bacteremia: Secondary | ICD-10-CM | POA: Diagnosis not present

## 2023-09-18 DIAGNOSIS — L02419 Cutaneous abscess of limb, unspecified: Secondary | ICD-10-CM | POA: Diagnosis not present

## 2023-09-18 DIAGNOSIS — I82A11 Acute embolism and thrombosis of right axillary vein: Secondary | ICD-10-CM | POA: Diagnosis not present

## 2023-09-18 LAB — BODY FLUID CULTURE W GRAM STAIN

## 2023-09-18 LAB — CULTURE, BLOOD (ROUTINE X 2): Special Requests: ADEQUATE

## 2023-09-18 LAB — AEROBIC/ANAEROBIC CULTURE W GRAM STAIN (SURGICAL/DEEP WOUND)

## 2023-09-18 MED ORDER — INFLUENZA VIRUS VACC SPLIT PF (FLUZONE) 0.5 ML IM SUSY
0.5000 mL | PREFILLED_SYRINGE | INTRAMUSCULAR | Status: DC
  Filled 2023-09-18: qty 0.5

## 2023-09-18 NOTE — Plan of Care (Signed)

## 2023-09-18 NOTE — TOC Progression Note (Signed)
Transition of Care [facility]) - Progression Note    Patient Details  Name: Laurie [name] MRN: [id number] Date of Birth: 18-Jan-1996  Transition of Care [facility]) CM/SW Contact  Otelia Santee, LCSW Phone Number: 09/18/2023, 12:28 PM  Clinical Narrative:    Pt recommended for RW. Pt agreeable to having RW ordered. Will order RW prior to discharge.   Expected Discharge Plan: Homeless Shelter Barriers to Discharge: No Barriers Identified  Expected Discharge Plan and Services In-house Referral: Clinical Social Work Discharge Planning Services: NA Post Acute Care Choice: NA Living arrangements for the past 2 months: Homeless                 DME Arranged: N/A DME Agency: NA                   Social Determinants of Health (SDOH) Interventions SDOH Screenings   Food Insecurity: Food Insecurity Present (09/15/2023)  Housing: High Risk (09/17/2023)  Transportation Needs: Unmet Transportation Needs (09/15/2023)  Utilities: At Risk (09/15/2023)  Depression (PHQ2-9): Medium Risk (04/26/2020)  Financial Resource Strain: Low Risk  (06/04/2018)  Social Connections: Unknown (03/05/2022)   Received from [facility], [facility]  Tobacco Use: High Risk (09/15/2023)    Readmission Risk Interventions    09/17/2023   11:03 AM  Readmission Risk Prevention Plan  Transportation Screening Complete  PCP or Specialist Appt within 5-7 Days Complete  Home Care Screening Complete  Medication Review (RN CM) Complete

## 2023-09-18 NOTE — Progress Notes (Signed)
  Progress Note   Patient: Laurie [name] [id number] DOB: 1996/05/10 DOA: 09/14/2023     3 DOS: the patient was seen and examined on 09/18/2023      Brief hospital course: 27 y.o. F with hx IVDA and polysubstance abuse, hx familial factor V Leiden, smoking, PTSD and chronic hep C who presented with malaise, chest discomfort, draining arm wounds and right knee swelling.   Found to have group A strep bacteremia, pulmonary nodules, right arm DVT, right knee septic arthritis.     Assessment and Plan: Group A strep bacteremia Concern for endocarditis - Continue cefazolin - TEE next Monday - Consult ID, appreciate expertise   Right knee septic arthritis Status post washout, drain removed today. - Consult orthopedics, appreciate cares - PT/OT  Pulmonary nodules Etiology unclear, have to suspect tricuspid endocarditis - TEE pending  Right upper extremity DVT  Ortho have cleared for resumption of anticoagulation Suspect abnormal ATIII and protein C/S function are from liver disease, maybe smoldering DIC. - Continue Xarelto - Repeat hypercoagulable panel in 3 months off anticoagulation  Anemia, microcytic Has had microcytic anemia in the past in the setting of pregnancy, vaginal bleeding, and cesarean section.  Reports no recent clinical bleeding anywhere.  Reticulocyte index hypoproliferative but mineral indices normal.  Suspect this is reduced production in setting of infection. - Trend Hgb - Transfusion threshold 7 g/dL  IVDU - Consult TOC and psychiatry - Continue suboxone  Thyroid nodule - Nonemergent outpatient [place]  Hepatitis C INR 1.4 on admission - Follow LFTs        Subjective: NO new complaints, no nursing concerns     Physical Exam: BP 136/61 (BP Location: Left Leg)   Pulse (!) 51   Temp 98 F (36.7 C) (Oral)   Resp 20   Wt 62.4 kg   LMP 07/29/2023 (Approximate) Comment: Negative urine pregnancy test 09-14-2023                     None in  November 2024 yet.  SpO2 100%   BMI 24.37 kg/m   Adult female, lying in bed, interactive and appropriate RRR, no murmurs, no peripheral edema Respiratory rate seems normal, lungs clear without rales or wheezes Abdomen soft no tenderness palpation or guarding Attention normal, affect normal judgment appear normal There are numerous ulcerated wounds on her bilateral arms, he has ability healing well, none of them are draining or have significant residual surrounding redness and edema except for 1 which is somewhat fluctuant on the right forearm.     Data Reviewed: Refused labs this morning.   Family Communication: None present    Disposition: Status is: Inpatient         Author: Alberteen Sam, MD 09/18/2023 1:01 PM  For on call review [email or web address].

## 2023-09-18 NOTE — Progress Notes (Signed)
OT Cancellation Note  Patient Details Name: Laurie [name] MRN: [id number] DOB: November 01, 1995   Cancelled Treatment:    Reason Eval/Treat Not Completed: Patient declined, no reason specified.   Barry Brunner, OT Acute Rehabilitation Services Office [phone]   [name] 09/18/2023, 11:02 AM

## 2023-09-19 DIAGNOSIS — F1990 Other psychoactive substance use, unspecified, uncomplicated: Secondary | ICD-10-CM | POA: Diagnosis not present

## 2023-09-19 DIAGNOSIS — I82A11 Acute embolism and thrombosis of right axillary vein: Secondary | ICD-10-CM | POA: Diagnosis not present

## 2023-09-19 DIAGNOSIS — L02419 Cutaneous abscess of limb, unspecified: Secondary | ICD-10-CM | POA: Diagnosis not present

## 2023-09-19 DIAGNOSIS — R7881 Bacteremia: Secondary | ICD-10-CM | POA: Diagnosis not present

## 2023-09-19 LAB — COMPREHENSIVE METABOLIC PANEL
ALT: 77 U/L — ABNORMAL HIGH (ref 0–44)
AST: 36 U/L (ref 15–41)
Albumin: 2 g/dL — ABNORMAL LOW (ref 3.5–5.0)
Alkaline Phosphatase: 116 U/L (ref 38–126)
Anion gap: 9 (ref 5–15)
BUN: 21 mg/dL — ABNORMAL HIGH (ref 6–20)
CO2: 25 mmol/L (ref 22–32)
Calcium: 7.9 mg/dL — ABNORMAL LOW (ref 8.9–10.3)
Chloride: 102 mmol/L (ref 98–111)
Creatinine, Ser: 0.78 mg/dL (ref 0.44–1.00)
GFR, Estimated: >60 mL/min (ref >60)
Glucose, Bld: 74 mg/dL (ref 70–99)
Potassium: 4.6 mmol/L (ref 3.5–5.1)
Sodium: 136 mmol/L (ref 135–145)
Total Bilirubin: 1.1 mg/dL (ref <1.2)
Total Protein: 6.5 g/dL (ref 6.5–8.1)

## 2023-09-19 LAB — CBC
HCT: 35.1 % — ABNORMAL LOW (ref 36.0–46.0)
Hemoglobin: 11.3 g/dL — ABNORMAL LOW (ref 12.0–15.0)
MCH: 24.3 pg — ABNORMAL LOW (ref 26.0–34.0)
MCHC: 32.2 g/dL (ref 30.0–36.0)
MCV: 75.5 fL — ABNORMAL LOW (ref 80.0–100.0)
Platelets: 260 10*3/uL (ref 150–400)
RBC: 4.65 MIL/uL (ref 3.87–5.11)
RDW: 19.9 % — ABNORMAL HIGH (ref 11.5–15.5)
WBC: 8.1 10*3/uL (ref 4.0–10.5)
nRBC: 0 % (ref 0.0–0.2)

## 2023-09-19 LAB — BPAM RBC
Blood Product Expiration Date: 202412232359
Blood Product Expiration Date: 202412252359
Blood Product Expiration Date: 202412272359
Unit Type and Rh: 600
Unit Type and Rh: 600
Unit Type and Rh: 600

## 2023-09-19 LAB — TYPE AND SCREEN
ABO/RH(D): A NEG
Antibody Screen: NEGATIVE
Unit division: 0
Unit division: 0
Unit division: 0

## 2023-09-19 LAB — PATHOLOGIST SMEAR REVIEW

## 2023-09-19 LAB — FACTOR 5 LEIDEN

## 2023-09-19 NOTE — Progress Notes (Signed)
PT Cancellation Note  Patient Details Name: Laurie [name] MRN: [id number] DOB: 1996/07/16   Cancelled Treatment:    Reason Eval/Treat Not Completed: Patient declined, no reason specified Pt declined PT.  Tried to encourage/educated but reports "I don't feel like it today."  Encouraged ROM and transfers with nursing.  RN reports pt refusing many things today.  Will f/u as able.  Anise Salvo, PT Acute Rehab [facility] Rehab [phone]   Rayetta Humphrey 09/19/2023, 3:35 PM

## 2023-09-19 NOTE — Progress Notes (Signed)
Pt refusing OOB and to use BSC.

## 2023-09-19 NOTE — Progress Notes (Signed)
   09/19/23 0507  Vitals  Temp (!) 102.1 F (38.9 C)  BP (!) 138/52  MAP (mmHg) 77  BP Location Left Leg  BP Method Automatic  Patient Position (if appropriate) Lying  Pulse Rate 71  Pulse Rate Source Monitor  Resp 18  Level of Consciousness  Level of Consciousness Alert  MEWS COLOR  MEWS Score Color Yellow  Oxygen Therapy  SpO2 96 %  O2 Device Room Air  Pain Assessment  Pain Score Asleep  Glasgow Coma Scale  Eye Opening 4  Best Verbal Response (NON-intubated) 5  Best Motor Response 6  Glasgow Coma Scale Score 15  MEWS Score  MEWS Temp 2  MEWS Systolic 0  MEWS Pulse 0  MEWS RR 0  MEWS LOC 0  MEWS Score 2  Provider Notification  Provider Name/Title Liana Crocker, NP  Date Provider Notified 09/19/23  Time Provider Notified [phone]  Method of Notification Page  Notification Reason Other (Comment) (Yellow)  Rapid Response Notification  Name of Rapid Response RN Notified NA  Date Rapid Response Notified 09/19/23  Time Rapid Response Notified  (NA)   Notified NP, admin tylenol, applied ice pack, removed all but pt personal blanket she refused to let me remove it even after education on fever, turned room temp down.

## 2023-09-19 NOTE — Evaluation (Signed)
Occupational Therapy Evaluation Patient Details Name: Laurie [name] MRN: [id number] DOB: July 20, 1996 Today's Date: 09/19/2023   History of Present Illness 27 y.o. F with hx IVDA and polysubstance abuse, hx familial factor V Leiden, smoking, PTSD and chronic hep C who presented with malaise, chest discomfort, draining arm wounds and right knee swelling.   Found to have group A strep bacteremia, pulmonary nodules, right arm DVT, right knee septic arthritis.   Clinical Impression   Pt admitted with the above diagnosis. Pt currently with functional limitations due to the deficits listed below (see OT Problem List). Prior to admit, pt was independent with all ADL tasks and functional mobility. Pt and spouse are currently homeless. See below for additional details. Evaluation limited due to pt lethargy and fatigue. Speaking with nursing and reviewing previous notes, pt has been refusing OOB activity which makes her at risk for continued weakness, decreased endurance, activity tolerance and additional DVT development. Pt was educated on the importance of getting out of bed 1-2 times. Discussed goals and focus on OT during admission. Pt verbalized understanding.  Pt will benefit from acute skilled OT to increase their safety and independence with ADL and functional mobility for ADL to facilitate discharge. No follow OT or DME needs at this time.         If plan is discharge home, recommend the following: A little help with walking and/or transfers;A little help with bathing/dressing/bathroom;Help with stairs or ramp for entrance    Functional Status Assessment  Patient has had a recent decline in their functional status and demonstrates the ability to make significant improvements in function in a reasonable and predictable amount of time.  Equipment Recommendations  None recommended by OT       Precautions / Restrictions Precautions Precautions: Fall Restrictions Weight Bearing Restrictions: No       Mobility Bed Mobility Overal bed mobility: Needs Assistance Bed Mobility: Supine to Sit, Sit to Supine     Supine to sit: Supervision Sit to supine: Supervision   General bed mobility comments: Based on PT evaluation    Transfers Overall transfer level: Needs assistance Equipment used: Rolling walker (2 wheels) Transfers: Sit to/from Stand Sit to Stand: Min assist           General transfer comment: Based on PT evaluation. Per nursing report, pt has refused OOB activity and has been using the bedpan for toileting. When asked, pt states she uses BSC. Pt educated to use only BSC with nursing and not bedpan to focus on general strength, endurance, and improving mobility. Bedpans removed from room and bucket placed in [facility] for use. NT made aware.      Balance Overall balance assessment: Mild deficits observed, not formally tested (Clinical judgement used based on pt's lack of mobility and refusal to get out of bed with nursing. With pt's 4 day admission of being in bed and septic Right knee, pt is more than likely experiencing difficulty with overall balance due to muscle weakness)          ADL either performed or assessed with clinical judgement   ADL Overall ADL's : Needs assistance/impaired Eating/Feeding: Independent;Bed level     Grooming Details (indicate cue type and reason): Pt declined need for toothbrush/toothpaste even though none was seen in room. Unsure if pt has brushed her teeth in the 4 days she's been here. Mouth rinse is sitting on tray table next to bed. Pt should be able to complete grooming tasks with set-up if needed.  Upper Body Bathing: Set up;Bed level   Lower Body Bathing: Maximal assistance;Bed level   Upper Body Dressing : Set up;Bed level   Lower Body Dressing: Bed level;Moderate assistance Lower Body Dressing Details (indicate cue type and reason): Pt reports that she has been able to don/doff her socks without assistance although not  demonstrated during evaluation. Toilet Transfer: Minimal assistance;BSC/3in1;Rolling walker (2 wheels) Toilet Transfer Details (indicate cue type and reason): based on PT evaluation. Toileting- Clothing Manipulation and Hygiene: Contact guard assist;Sitting/lateral lean               Vision Baseline Vision/History: 0 No visual deficits Ability to See in Adequate Light: 0 Adequate Patient Visual Report: No change from baseline Vision Assessment?: No apparent visual deficits     Perception Perception: Not tested       Praxis Praxis: Not tested       Pertinent Vitals/Pain Pain Assessment Pain Assessment: No/denies pain Pain Score: 0-No pain     Extremity/Trunk Assessment Upper Extremity Assessment Upper Extremity Assessment: Generalized weakness   Lower Extremity Assessment Lower Extremity Assessment: Defer to PT evaluation;Generalized weakness   Cervical / Trunk Assessment Cervical / Trunk Assessment: Normal   Communication Communication Communication: No apparent difficulties   Cognition Arousal: Lethargic Behavior During Therapy: Flat affect Overall Cognitive Status: Within Functional Limits for tasks assessed    General Comments: Pt sleeping upon therapy arrival. Quiet and remained with eyes closed for most of session.     General Comments               Home Living Family/patient expects to be discharged to:: Shelter/Homeless       Additional Comments: Homeless with husband. They sleep under bridge. Goes to friend's home to shower and do laundry. Walks or uses bus for transportation.      Prior Functioning/Environment Prior Level of Function : Independent/Modified Independent         OT Problem List: Impaired balance (sitting and/or standing);Decreased activity tolerance;Decreased strength      OT Treatment/Interventions: Therapeutic activities;[place]-care/ADL training;Therapeutic exercise;Patient/family education;DME and/or AE  instruction;Balance training;Manual therapy;Modalities    OT Goals(Current goals can be found in the care plan section) Acute Rehab OT Goals Patient Stated Goal: to return to being independent as before. OT Goal Formulation: With patient Time For Goal Achievement: 10/03/23 Potential to Achieve Goals: Fair  OT Frequency: Min 1X/week       AM-PAC OT "6 Clicks" Daily Activity     Outcome Measure Help from another person eating meals?: None Help from another person taking care of personal grooming?: None Help from another person toileting, which includes using toliet, bedpan, or urinal?: A Lot Help from another person bathing (including washing, rinsing, drying)?: A Lot Help from another person to put on and taking off regular upper body clothing?: None Help from another person to put on and taking off regular lower body clothing?: A Lot 6 Click Score: 18   End of Session Nurse Communication: Other (comment) (recommendation to get out of bed once a day and use BSC vs. bedpan.)  Activity Tolerance: Patient limited by fatigue;Patient limited by lethargy Patient left: in bed;with call bell/phone within reach;with bed alarm set  OT Visit Diagnosis: Unsteadiness on feet (R26.81);Muscle weakness (generalized) (M62.81);Other (comment) (decreased activity tolerance)                Time: 1025-1040 OT Time Calculation (min): 15 min Charges:  OT General Charges $OT Visit: 1 Visit OT Evaluation $OT Eval Low Complexity:  1 Low  [employer], OTR/L,CBIS  Supplemental OT - [facility] and [employer] Secure Chat Preferred    Zamiah Tollett, Charisse March 09/19/2023, 11:03 AM

## 2023-09-19 NOTE — Progress Notes (Signed)
   [facility] has been requested to perform a transesophageal echocardiogram on Laurie [name] for bacteremia.  After careful review of history and examination, the risks and benefits of transesophageal echocardiogram have been explained including risks of esophageal damage, perforation (1:10,000 risk), bleeding, pharyngeal hematoma as well as other potential complications associated with conscious sedation including aspiration, arrhythmia, respiratory failure and death. Alternatives to treatment were discussed, questions were answered. Patient is willing to proceed.   Abagail Kitchens, PA-C  09/19/2023 8:43 AM

## 2023-09-19 NOTE — Plan of Care (Signed)
  Problem: Education: Goal: Knowledge of General Education information will improve Description: Including pain rating scale, medication(s)/side effects and non-pharmacologic comfort measures Outcome: Progressing   Problem: Health Behavior/Discharge Planning: Goal: Ability to manage health-related needs will improve Outcome: Progressing   Problem: Clinical Measurements: Goal: Ability to maintain clinical measurements within normal limits will improve Outcome: Progressing Goal: Will remain free from infection Outcome: Progressing Goal: Diagnostic test results will improve Outcome: Progressing Goal: Respiratory complications will improve Outcome: Progressing Goal: Cardiovascular complication will be avoided Outcome: Progressing   Problem: Pain Management: Goal: General experience of comfort will improve Outcome: Progressing   Problem: Coping: Goal: Level of anxiety will decrease Outcome: Progressing   Problem: Skin Integrity: Goal: Risk for impaired skin integrity will decrease Outcome: Progressing

## 2023-09-19 NOTE — Progress Notes (Signed)
  Progress Note   Patient: Laurie [name] MWU:[id number] DOB: Mar 12, 1996 DOA: 09/14/2023     4 DOS: the patient was seen and examined on 09/19/2023 at 1:15 PM      Brief hospital course: 27 y.o. F with hx IVDA and polysubstance abuse, hx familial factor V Leiden, smoking, PTSD and chronic hep C who presented with malaise, chest discomfort, draining arm wounds and right knee swelling.   Found to have group A strep bacteremia, pulmonary nodules, right arm DVT, right knee septic arthritis.     Assessment and Plan: Group A strep bacteremia Possible endocarditis Fever overnight.  No new focal joint pain. - Continue cefazolin - TEE next Monday  Right knee septic arthritis This is improving after her washout  Pulmonary nodules Suspect tricuspid endocarditis - Obtain TEE - Continue antibiotics  Right upper extremity DVT - Continue Xarelto  Microcytic anemia Patient is refusing labs the last 2 days. - Attempt to repeat hemoglobin today  Polysubstance abuse - Continue Suboxone       Subjective: Patient had a fever overnight, she had malice this morning, sweats, generalized malaise, no new joint pain, no new skin lesions, all of her skin lesions appear to be getting better.  She has had no cough, no shortness of breath.  No confusion.  No new focal weakness or numbness.     Physical Exam: BP (!) 125/48 (BP Location: Left Leg)   Pulse (!) 56   Temp 98.4 F (36.9 C) (Oral)   Resp 16   Wt 62.4 kg   LMP 07/29/2023 (Approximate) Comment: Negative urine pregnancy test 09-14-2023                     None in November 2024 yet.  SpO2 98%   BMI 24.37 kg/m   Thin adult female, lying in bed, sluggish at first, but later interactive and appropriate, states she just "feels bad" from the fever RRR, no murmurs, no peripheral edema except in the right arm due to her DVT Respiratory rate normal, lungs clear without rales or wheezes Abdomen soft no tenderness palpation, no  guarding Her numerous ulcerated wounds are healing well, none of them have draining or significant residual surrounding redness, including the one on the right forearm  Data Reviewed: Refusing labs  Family Communication: None present    Disposition: Status is: Inpatient         Author: Alberteen Sam, MD 09/19/2023 4:44 PM  For on call review [email or web address].

## 2023-09-20 ENCOUNTER — Inpatient Hospital Stay (HOSPITAL_COMMUNITY): Payer: MEDICAID

## 2023-09-20 DIAGNOSIS — B182 Chronic viral hepatitis C: Secondary | ICD-10-CM | POA: Diagnosis not present

## 2023-09-20 DIAGNOSIS — R918 Other nonspecific abnormal finding of lung field: Secondary | ICD-10-CM | POA: Diagnosis not present

## 2023-09-20 DIAGNOSIS — B950 Streptococcus, group A, as the cause of diseases classified elsewhere: Secondary | ICD-10-CM | POA: Diagnosis not present

## 2023-09-20 DIAGNOSIS — I82A11 Acute embolism and thrombosis of right axillary vein: Secondary | ICD-10-CM | POA: Diagnosis not present

## 2023-09-20 DIAGNOSIS — L02419 Cutaneous abscess of limb, unspecified: Secondary | ICD-10-CM | POA: Diagnosis not present

## 2023-09-20 DIAGNOSIS — Z86718 Personal history of other venous thrombosis and embolism: Secondary | ICD-10-CM

## 2023-09-20 DIAGNOSIS — F1990 Other psychoactive substance use, unspecified, uncomplicated: Secondary | ICD-10-CM | POA: Diagnosis not present

## 2023-09-20 DIAGNOSIS — M00261 Other streptococcal arthritis, right knee: Secondary | ICD-10-CM | POA: Diagnosis not present

## 2023-09-20 NOTE — Plan of Care (Signed)
  Problem: Education: Goal: Knowledge of General Education information will improve Description: Including pain rating scale, medication(s)/side effects and non-pharmacologic comfort measures Outcome: Progressing   Problem: Health Behavior/Discharge Planning: Goal: Ability to manage health-related needs will improve Outcome: Progressing   Problem: Clinical Measurements: Goal: Will remain free from infection Outcome: Progressing Goal: Diagnostic test results will improve Outcome: Progressing Goal: Respiratory complications will improve Outcome: Progressing   Problem: Activity: Goal: Risk for activity intolerance will decrease Outcome: Progressing   Problem: Nutrition: Goal: Adequate nutrition will be maintained Outcome: Progressing   Problem: Coping: Goal: Level of anxiety will decrease Outcome: Progressing   Problem: Elimination: Goal: Will not experience complications related to bowel motility Outcome: Progressing Goal: Will not experience complications related to urinary retention Outcome: Progressing   Problem: Pain Management: Goal: General experience of comfort will improve Outcome: Progressing   Problem: Safety: Goal: Ability to remain free from injury will improve Outcome: Progressing   Problem: Skin Integrity: Goal: Risk for impaired skin integrity will decrease Outcome: Progressing

## 2023-09-20 NOTE — Progress Notes (Signed)
   09/20/23 2106  Assess: MEWS Score  Temp (!) 101.9 F (38.8 C)  BP (!) 116/52  MAP (mmHg) 72  Pulse Rate 76  Resp 18  SpO2 97 %  O2 Device Room Air  Assess: MEWS Score  MEWS Temp 2  MEWS Systolic 0  MEWS Pulse 0  MEWS RR 0  MEWS LOC 0  MEWS Score 2  MEWS Score Color Yellow  Assess: if the MEWS score is Yellow or Red  Were vital signs accurate and taken at a resting state? Yes  Does the patient meet 2 or more of the SIRS criteria? Yes  Does the patient have a confirmed or suspected source of infection? Yes  MEWS guidelines implemented  Yes, yellow  Treat  MEWS Interventions Considered administering scheduled or prn medications/treatments as ordered  Take Vital Signs  Increase Vital Sign Frequency  Yellow: Q2hr x1, continue Q4hrs until patient remains green for 12hrs  Escalate  MEWS: Escalate Yellow: Discuss with charge nurse and consider notifying provider and/or RRT  Notify: Charge Nurse/RN  Name of Charge Nurse/RN Notified Calvin RN  Provider Notification  Provider Name/Title Virgel Manifold NP  Date Provider Notified 09/20/23  Time Provider Notified 2109  Method of Notification Page  Notification Reason Change in status  Assess: SIRS CRITERIA  SIRS Temperature  1  SIRS Pulse 0  SIRS Respirations  0  SIRS WBC 0  SIRS Score Sum  1   Yellow MEWS due to elevated temperature. Alert and oriented x 4. Will follow yellow MEWS protocol.

## 2023-09-20 NOTE — Progress Notes (Signed)
  Progress Note   Patient: Laurie [name] RUE:[id number] DOB: 1996-04-10 DOA: 09/14/2023     5 DOS: the patient was seen and examined on 09/20/2023 at 9:31 AM      Brief hospital course: 27 y.o. F with hx IVDA and polysubstance abuse, hx familial factor V Leiden, smoking, PTSD and chronic hep C who presented with malaise, chest discomfort, draining arm wounds and right knee swelling.   Found to have group A strep bacteremia, pulmonary nodules, right arm DVT, right knee septic arthritis.     Assessment and Plan: Group A strep bacteremia Suspected endocarditis Recurrent fevers Unclear the source of her current fevers.  We know that she has right knee septic arthritis, but this was washed out.  Overall her skin lesions are improving.  She has no focal joint pains, no new back pain, no new abdominal pain, no nuchal rigidity, and no new cough. - Continue cefazolin - Consult ID - Planning for TEE on Monday - Obtain chest x-ray due to new fever  Right upper extremity DVT - Continue Xarelto  Microcytic anemia Repeat hemoglobin shows hemoglobin improved  Polysubstance abuse with opiate withdrawal - Continue Suboxone  Hypoalbuminemia         Subjective: The patient had fever again overnight last night, overall she feels generalized malaise, but denies focal joint pains, worsening skin lesions, abdominal pain, neck pain, back pain.     Physical Exam: BP (!) 136/52   Pulse 67   Temp 98.9 F (37.2 C) (Oral)   Resp 16   Wt 62.4 kg   LMP 07/29/2023 (Approximate) Comment: Negative urine pregnancy test 09-14-2023                     None in November 2024 yet.  SpO2 98%   BMI 24.37 kg/m   Adult female, appears disheveled and tired, lying in bed RRR, no murmurs, no peripheral edema Respiratory rate normal, lungs diminished but no rales or wheezes appreciated Abdomen soft without tenderness palpation Attention normal, affect appropriate, judgment and insight appear  normal     Data Reviewed: Discussed with ID Sodium, potassium, and creatinine normal Hemoglobin 11.3, microcytic  LFTs improving   Family Communication: None present    Disposition: Status is: Inpatient         Author: Alberteen Sam, MD 09/20/2023 5:13 PM  For on call review [email or web address].

## 2023-09-20 NOTE — Plan of Care (Signed)

## 2023-09-21 DIAGNOSIS — F1990 Other psychoactive substance use, unspecified, uncomplicated: Secondary | ICD-10-CM | POA: Diagnosis not present

## 2023-09-21 DIAGNOSIS — L02419 Cutaneous abscess of limb, unspecified: Secondary | ICD-10-CM | POA: Diagnosis not present

## 2023-09-21 DIAGNOSIS — R7881 Bacteremia: Secondary | ICD-10-CM | POA: Diagnosis not present

## 2023-09-21 DIAGNOSIS — I82A11 Acute embolism and thrombosis of right axillary vein: Secondary | ICD-10-CM | POA: Diagnosis not present

## 2023-09-21 LAB — AEROBIC/ANAEROBIC CULTURE W GRAM STAIN (SURGICAL/DEEP WOUND): Culture: NO GROWTH

## 2023-09-21 MED ORDER — MELATONIN 5 MG PO TABS
5.0000 mg | ORAL_TABLET | Freq: Every evening | ORAL | Status: AC | PRN
Start: 2023-09-21 — End: 2023-09-23
  Administered 2023-09-21 – 2023-09-23 (×3): 5 mg via ORAL
  Filled 2023-09-21 (×3): qty 1

## 2023-09-21 NOTE — H&P (View-Only) (Signed)
  Progress Note   Patient: Laurie [name] Erno ZOX:[id number] DOB: [birth year] DOA: 09/14/2023     6 DOS: the patient was seen and examined on 09/21/2023 at 9:15AM      Brief hospital course: 27 y.o. F with hx IVDA and polysubstance abuse, hx familial factor V Leiden, smoking, PTSD and chronic hep C who presented with malaise, chest discomfort, draining arm wounds and right knee swelling.   Found to have group A strep bacteremia, pulmonary nodules, right arm DVT, right knee septic arthritis.     Assessment and Plan:  Group A strep bacteremia Suspected endocarditis -Continue cefazolin - Planning for TEE on Monday   Recurrent fevers Unclear what the source of this is, chest x-ray yesterday no new opacities.  ID suspect possibly aspirating.  Possibly from her skin and soft tissue infections.  Fevers too high to be from the DVT.  Antibiotic related? - TEE on Monday - Follow repeat cultures  Right knee septic arthritis Drain has been removed -Consult orthopedics -PT/OT  Pulmonary nodules -TEE on Monday  Right upper extremity DVT -Continue Xarelto  Polysubstance abuse with opiate withdrawal -Continue Suboxone  Microcytic anemia Resolved - We needed another CBC, but the patient is very resistant for any lab draws  Hypoalbuminemia        Subjective: Patient feels generally unwell, fevered again yesterday, but she has no new skin lesions, no new joint swelling, no neck pain, chest pain, dyspnea, cough, abdominal pain.       Physical Exam: BP (!) 96/43 (BP Location: Left Arm)   Pulse 71   Temp 99.1 F (37.3 C) (Oral)   Resp 16   Ht 5\' 3"  (1.6 m)   Wt 62.4 kg   LMP 07/29/2023 (Approximate) Comment: Negative urine pregnancy test 09-14-2023                     None in November 2024 yet.  SpO2 97%   BMI 24.37 kg/m   Adult female, lying in bed, interactive but tired RRR, no murmurs, no peripheral edema Respiratory rate normal, lungs clear without rales or  wheezes Abdomen soft without tenderness palpation or guarding Attention normal, affect blunted, appears very tired and sluggish, face symmetric, oriented to person, place, and time, generalized weakness but symmetric strength, 4/5 bilaterally Skin lesions are observed and are all improving, even the one on the right forearm that does not draining    Data Reviewed: Blood culture pending, no growth Chest x-ray from yesterday no new infiltrate Aspirate from knee, no growth  Family Communication: No family present    Disposition: Status is: Inpatient         Author: Alberteen Sam, MD 09/21/2023 11:45 AM  For on call review [email or web address].

## 2023-09-21 NOTE — Progress Notes (Signed)
  Progress Note   Patient: Laurie [name] Erno ZOX:[id number] DOB: [birth year] DOA: 09/14/2023     6 DOS: the patient was seen and examined on 09/21/2023 at 9:15AM      Brief hospital course: 27 y.o. F with hx IVDA and polysubstance abuse, hx familial factor V Leiden, smoking, PTSD and chronic hep C who presented with malaise, chest discomfort, draining arm wounds and right knee swelling.   Found to have group A strep bacteremia, pulmonary nodules, right arm DVT, right knee septic arthritis.     Assessment and Plan:  Group A strep bacteremia Suspected endocarditis -Continue cefazolin - Planning for TEE on Monday   Recurrent fevers Unclear what the source of this is, chest x-ray yesterday no new opacities.  ID suspect possibly aspirating.  Possibly from her skin and soft tissue infections.  Fevers too high to be from the DVT.  Antibiotic related? - TEE on Monday - Follow repeat cultures  Right knee septic arthritis Drain has been removed -Consult orthopedics -PT/OT  Pulmonary nodules -TEE on Monday  Right upper extremity DVT -Continue Xarelto  Polysubstance abuse with opiate withdrawal -Continue Suboxone  Microcytic anemia Resolved - We needed another CBC, but the patient is very resistant for any lab draws  Hypoalbuminemia        Subjective: Patient feels generally unwell, fevered again yesterday, but she has no new skin lesions, no new joint swelling, no neck pain, chest pain, dyspnea, cough, abdominal pain.       Physical Exam: BP (!) 96/43 (BP Location: Left Arm)   Pulse 71   Temp 99.1 F (37.3 C) (Oral)   Resp 16   Ht 5\' 3"  (1.6 m)   Wt 62.4 kg   LMP 07/29/2023 (Approximate) Comment: Negative urine pregnancy test 09-14-2023                     None in November 2024 yet.  SpO2 97%   BMI 24.37 kg/m   Adult female, lying in bed, interactive but tired RRR, no murmurs, no peripheral edema Respiratory rate normal, lungs clear without rales or  wheezes Abdomen soft without tenderness palpation or guarding Attention normal, affect blunted, appears very tired and sluggish, face symmetric, oriented to person, place, and time, generalized weakness but symmetric strength, 4/5 bilaterally Skin lesions are observed and are all improving, even the one on the right forearm that does not draining    Data Reviewed: Blood culture pending, no growth Chest x-ray from yesterday no new infiltrate Aspirate from knee, no growth  Family Communication: No family present    Disposition: Status is: Inpatient         Author: Alberteen Sam, MD 09/21/2023 11:45 AM  For on call review [email or web address].

## 2023-09-21 NOTE — Plan of Care (Signed)
  Problem: Clinical Measurements: Goal: Ability to maintain clinical measurements within normal limits will improve Outcome: Progressing Goal: Respiratory complications will improve Outcome: Progressing Goal: Cardiovascular complication will be avoided Outcome: Progressing   Problem: Coping: Goal: Level of anxiety will decrease Outcome: Progressing   Problem: Pain Management: Goal: General experience of comfort will improve Outcome: Progressing

## 2023-09-21 NOTE — Progress Notes (Signed)
[facility]  Date of Admission:  09/14/2023    Principal Problem:   Streptococcal bacteremia Active Problems:   Abscess of upper extremity   Septic arthritis of knee, right (HCC)   Multiple lung nodules-secondary to septic emboli   Acute deep vein thrombosis (DVT) of axillary vein of right upper extremity (HCC)   Microcytic anemia   Transaminitis   IVDU (intravenous drug user)   Thyroid nodule   Tobacco dependence due to cigarettes   Opiate use          Assessment: 27 year old female admitted with: #Group A strep bacteremia/SSTI/right knee septic arthritis concern for endocarditis given pulmonary nodule #Fever #Polysubstance abuse - She was initially cefazolin linezolid then transition to cefazolin alone #DVT of upper right arm - Defer to primary for anticoagulation - Given SSTI and fevers, patient notes no worsening of lesions. #Chronic hep C - Viral load for hepatitis C is elevated at 1.45 million.  INR slightly elevated.  1.4 - Management outpatient Recommendations: -Repeat blood cultures - Patient denies any respiratory symptoms but does report eating in bed.  I wonder if she is aspirating.  I discussed aspiration precautions with her.  She does have subcutaneous abscesses, would go encourage wound care.  Certainly SSTI could be contributing to fevers as well. -TEE planned on Monday to eval for for endocarditis  Microbiology:   Antibiotics: cefazolin Cultures: Blood 11/27 NG Urine  Other   SUBJECTIVE: Resting inbed. No new complaints Interval: tmax 101.9wbc 8.1k  Review of Systems: Review of Systems  All other systems reviewed and are negative.    Scheduled Meds:  buprenorphine-naloxone  1 tablet Sublingual TID   Chlorhexidine Gluconate Cloth  6 each Topical Daily   ferrous sulfate  325 mg Oral Q breakfast   influenza vac split trivalent PF  0.5 mL Intramuscular Tomorrow-1000   nicotine  21 mg Transdermal Daily    mouth rinse  15 mL Mouth Rinse 4 times per day   pneumococcal 20-valent conjugate vaccine  0.5 mL Intramuscular Tomorrow-1000   Rivaroxaban  15 mg Oral BID WC   Followed by   Melene Muller ON 10/08/2023] rivaroxaban  20 mg Oral Q supper   sodium chloride flush  3 mL Intravenous Q12H   sodium chloride flush  3 mL Intravenous Q12H   Continuous Infusions:   ceFAZolin (ANCEF) IV 2 g (09/21/23 0621)   PRN Meds:.acetaminophen **OR** acetaminophen, dicyclomine, hydrOXYzine, loperamide, methocarbamol, naproxen, nicotine polacrilex, ondansetron **OR** ondansetron (ZOFRAN) IV, mouth rinse, senna-docusate, sodium chloride flush Allergies  Allergen Reactions   Keflex [Cephalexin] Hives   Penicillins Other (See Comments)    Results thru an allergy skin test Has patient had a PCN reaction causing immediate rash, facial/tongue/throat swelling, SOB or lightheadedness with hypotension: No Has patient had a PCN reaction causing severe rash involving mucus membranes or skin necrosis: No Has patient had a PCN reaction that required hospitalization No Has patient had a PCN reaction occurring within the last 10 years: No If all of the above answers are "NO", then may proceed with Cephalosporin use.      OBJECTIVE: Vitals:   09/21/23 0602 09/21/23 0708 09/21/23 0724 09/21/23 0907  BP: (!) 90/50 (!) 96/43    Pulse: 68 71    Resp:  16    Temp:   99.3 F (37.4 C) 99.1 F (37.3 C)  TempSrc:   Oral Oral  SpO2:  97%    Weight:  Height:       Body mass index is 24.37 kg/m.  Physical Exam Constitutional:      Appearance: Normal appearance.  HENT:     Head: Normocephalic and atraumatic.     Right Ear: Tympanic membrane normal.     Left Ear: Tympanic membrane normal.     Nose: Nose normal.     Mouth/Throat:     Mouth: Mucous membranes are moist.  Eyes:     Extraocular Movements: Extraocular movements intact.     Conjunctiva/sclera: Conjunctivae normal.     Pupils: Pupils are equal, round, and  reactive to light.  Cardiovascular:     Rate and Rhythm: Normal rate and regular rhythm.     Heart sounds: No murmur heard.    No friction rub. No gallop.  Pulmonary:     Effort: Pulmonary effort is normal.     Breath sounds: Normal breath sounds.  Abdominal:     General: Abdomen is flat.     Palpations: Abdomen is soft.  Musculoskeletal:        General: Normal range of motion.  Skin:    General: Skin is warm and dry.  Neurological:     General: No focal deficit present.     Mental Status: She is alert and oriented to person, place, and time.  Psychiatric:        Mood and Affect: Mood normal.       Lab Results Lab Results  Component Value Date   WBC 8.1 09/19/2023   HGB 11.3 (L) 09/19/2023   HCT 35.1 (L) 09/19/2023   MCV 75.5 (L) 09/19/2023   PLT 260 09/19/2023    Lab Results  Component Value Date   CREATININE 0.78 09/19/2023   BUN 21 (H) 09/19/2023   NA 136 09/19/2023   K 4.6 09/19/2023   CL 102 09/19/2023   CO2 25 09/19/2023    Lab Results  Component Value Date   ALT 77 (H) 09/19/2023   AST 36 09/19/2023   ALKPHOS 116 09/19/2023   BILITOT 1.1 09/19/2023        Danelle Earthly, MD [facility] 09/21/2023, 9:11 AM   I have personally spent 52 minutes involved in face-to-face and non-face-to-face activities for this patient on the day of the visit. Professional time spent includes the following activities: Preparing to see the patient (review of tests), Obtaining and/or reviewing separately obtained history (admission/discharge record), Performing a medically appropriate examination and/or evaluation , Ordering medications/tests/procedures, referring and communicating with other health care professionals, Documenting clinical information in the EMR, Independently interpreting results (not separately reported), Communicating results to the patient/family/caregiver, Counseling and educating the  patient/family/caregiver and Care coordination (not separately reported).

## 2023-09-22 ENCOUNTER — Inpatient Hospital Stay (HOSPITAL_COMMUNITY): Payer: MEDICAID | Admitting: Registered Nurse

## 2023-09-22 ENCOUNTER — Encounter (HOSPITAL_COMMUNITY)
Admission: EM | Disposition: A | Discharge status: Home / Self Care | Payer: Self-pay | Source: Home / Self Care | Attending: Family Medicine

## 2023-09-22 ENCOUNTER — Inpatient Hospital Stay (HOSPITAL_COMMUNITY)
Admit: 2023-09-22 | Discharge: 2023-09-22 | Disposition: A | Discharge status: Home / Self Care | Payer: MEDICAID | Attending: Cardiology | Admitting: Cardiology

## 2023-09-22 ENCOUNTER — Encounter (HOSPITAL_COMMUNITY): Payer: Self-pay | Admitting: Cardiovascular Disease

## 2023-09-22 DIAGNOSIS — R509 Fever, unspecified: Secondary | ICD-10-CM

## 2023-09-22 DIAGNOSIS — I82A11 Acute embolism and thrombosis of right axillary vein: Secondary | ICD-10-CM | POA: Diagnosis not present

## 2023-09-22 DIAGNOSIS — M00261 Other streptococcal arthritis, right knee: Secondary | ICD-10-CM | POA: Diagnosis not present

## 2023-09-22 DIAGNOSIS — R7881 Bacteremia: Secondary | ICD-10-CM

## 2023-09-22 DIAGNOSIS — L02419 Cutaneous abscess of limb, unspecified: Secondary | ICD-10-CM | POA: Diagnosis not present

## 2023-09-22 DIAGNOSIS — R918 Other nonspecific abnormal finding of lung field: Secondary | ICD-10-CM | POA: Diagnosis not present

## 2023-09-22 HISTORY — PX: TRANSESOPHAGEAL ECHOCARDIOGRAM (CATH LAB): EP1270

## 2023-09-22 LAB — CULTURE, BLOOD (ROUTINE X 2)
Culture: NO GROWTH
Culture: NO GROWTH

## 2023-09-22 LAB — CBC
HCT: 24.6 % — ABNORMAL LOW (ref 36.0–46.0)
Hemoglobin: 7.6 g/dL — ABNORMAL LOW (ref 12.0–15.0)
MCH: 24.3 pg — ABNORMAL LOW (ref 26.0–34.0)
MCHC: 30.9 g/dL (ref 30.0–36.0)
MCV: 78.6 fL — ABNORMAL LOW (ref 80.0–100.0)
Platelets: 397 10*3/uL (ref 150–400)
RBC: 3.13 MIL/uL — ABNORMAL LOW (ref 3.87–5.11)
RDW: 19.9 % — ABNORMAL HIGH (ref 11.5–15.5)
WBC: 9.2 10*3/uL (ref 4.0–10.5)
nRBC: 0 % (ref 0.0–0.2)

## 2023-09-22 LAB — PROTHROMBIN GENE MUTATION

## 2023-09-22 LAB — ECHO TEE

## 2023-09-22 SURGERY — TRANSESOPHAGEAL ECHOCARDIOGRAM (TEE) (CATHLAB)
Anesthesia: Monitor Anesthesia Care

## 2023-09-22 MED ORDER — PROPOFOL 10 MG/ML IV BOLUS
INTRAVENOUS | Status: DC | PRN
  Administered 2023-09-22: 100 mg via INTRAVENOUS
  Administered 2023-09-22: 125 ug/kg/min via INTRAVENOUS

## 2023-09-22 MED ORDER — SODIUM CHLORIDE 0.9 % IV SOLN: INTRAVENOUS | Status: DC

## 2023-09-22 MED ORDER — LIDOCAINE HCL (CARDIAC) PF 100 MG/5ML IV SOSY
PREFILLED_SYRINGE | INTRAVENOUS | Status: AC
  Filled 2023-09-22: qty 5

## 2023-09-22 MED ORDER — LIDOCAINE 2% (20 MG/ML) 5 ML SYRINGE
INTRAMUSCULAR | Status: DC | PRN
  Administered 2023-09-22: 60 mg via INTRAVENOUS

## 2023-09-22 NOTE — Anesthesia Preprocedure Evaluation (Addendum)
Anesthesia Evaluation  Patient identified by MRN, date of birth, ID band Patient awake    Reviewed: Allergy & Precautions, NPO status , Patient's Chart, lab work & pertinent test results  Airway Mallampati: II       Dental  (+) Poor Dentition, Missing, Loose, Chipped   Pulmonary asthma , Current Smoker and Patient abstained from smoking.    + decreased breath sounds      Cardiovascular negative cardio ROS  Rhythm:Regular Rate:Normal     Neuro/Psych  PSYCHIATRIC DISORDERS Anxiety Depression    negative neurological ROS     GI/Hepatic negative GI ROS,,,(+) Hepatitis -, C  Endo/Other  diabetes    Renal/GU negative Renal ROS     Musculoskeletal  (+) Arthritis ,    Abdominal   Peds  Hematology  (+) Blood dyscrasia, anemia   Anesthesia Other Findings   Reproductive/Obstetrics                             Anesthesia Physical Anesthesia Plan  ASA: 3  Anesthesia Plan: MAC   Post-op Pain Management: Minimal or no pain anticipated   Induction: Intravenous  PONV Risk Score and Plan: 0 and Propofol infusion  Airway Management Planned: Natural Airway and Nasal Cannula  Additional Equipment: None  Intra-op Plan:   Post-operative Plan:   Informed Consent: I have reviewed the patients History and Physical, chart, labs and discussed the procedure including the risks, benefits and alternatives for the proposed anesthesia with the patient or authorized representative who has indicated his/her understanding and acceptance.       Plan Discussed with: CRNA  Anesthesia Plan Comments:        Anesthesia Quick Evaluation

## 2023-09-22 NOTE — Assessment & Plan Note (Addendum)
Possible endocarditis TEE negative but constellation of streptococcal bacteremia, right knee septic arthritis and lung nodules implies endocarditis.  ID recommend 2 weeks IV antibiotics then 4 more weeks oral antibiotics. - Continue cefazolin until Dec 10 then oral therapy with EOT Jan 7 - Consult ID

## 2023-09-22 NOTE — Progress Notes (Signed)
PT Cancellation Note  Patient Details Name: Laurie [name] MRN: [id number] DOB: Mar 02, 1996   Cancelled Treatment:    Reason Eval/Treat Not Completed: Patient at procedure or test/unavailable/TEE. Blanchard Kelch PT Acute Rehabilitation Services Office [phone] Weekend [phone]    Rada Hay 09/22/2023, 9:49 AM

## 2023-09-22 NOTE — Interval H&P Note (Signed)
History and Physical Interval Note:  09/22/2023 7:47 AM  Laurie [name]  has presented today for surgery, with the diagnosis of BACTEREMIA.  The various methods of treatment have been discussed with the patient and family. After consideration of risks, benefits and other options for treatment, the patient has consented to  Procedure(s): TRANSESOPHAGEAL ECHOCARDIOGRAM (N/A) as a surgical intervention.  The patient's history has been reviewed, patient examined, no change in status, stable for surgery.  I have reviewed the patient's chart and labs.  Questions were answered to the patient's satisfaction.     Locklan Canoy

## 2023-09-22 NOTE — Assessment & Plan Note (Signed)
Spiked fever to 102 last 3 days, none in the last 24 hours.  Repeat cultures negative.  No new metastatic focus.  CXR showed no aspiration.  Unclear source.   Now resolved

## 2023-09-22 NOTE — Progress Notes (Signed)
OT Cancellation Note  Patient Details Name: Laurie [name] MRN: [id number] DOB: 01-15-1996   Cancelled Treatment:    Reason Eval/Treat Not Completed: Medical issues which prohibited therapy;Patient at procedure or test/ unavailable. Chart reviewed, pt off unit for transesophageal echocardiogram. OT will re-attempt as medically appropriate and upon continuation of current/new OT order.     Kamya Watling L. Jordynn Marcella, OTR/L  09/22/23, 9:24 AM

## 2023-09-22 NOTE — Plan of Care (Signed)

## 2023-09-22 NOTE — Progress Notes (Signed)
Brief ID note  - TEE no veg. Pt continues to fever although appears fever curve is trending down. - Would favor 2 weeks antibiotics from negative cultures EOT 12/10 given persistent fevers then transition to p.o. antibiotics to complete total 6 weeks.  I am concerned that patient may have already embolized a vegetation given concern for metastatic disease in the setting of right knee septic arthritis and pulmonary nodules

## 2023-09-22 NOTE — Assessment & Plan Note (Signed)
Mild transaminitis US liver unremarkable - Follow up with ID for treatment

## 2023-09-22 NOTE — Assessment & Plan Note (Signed)
Hgb stable in the 7s - Continue ferrous sulfate

## 2023-09-22 NOTE — Assessment & Plan Note (Addendum)
S/p irrigation and debridement right knee by Dr. Blanchie Dessert 11/25 Aspirated by Dr. Madilyn Hook in the ER 11/25.  Aspirate with 63K WBCs, gram stain positive but culture negative.  Taken to OR for washout Drain now removed. - WBAT - ROM as tolerated - PT - Leave tegaderms in place for 2 weeks or unless removed by Orthopedic surgery; sutures to be removed at 2-3 weeks (I.e. Dec 9-16)

## 2023-09-22 NOTE — Assessment & Plan Note (Signed)
Incidental finding, TSH normal - Outpatient thyroid US recommended

## 2023-09-22 NOTE — Plan of Care (Signed)
  Problem: Education: Goal: Knowledge of General Education information will improve Description Including pain rating scale, medication(s)/side effects and non-pharmacologic comfort measures Outcome: Progressing   Problem: Health Behavior/Discharge Planning: Goal: Ability to manage health-related needs will improve Outcome: Progressing   

## 2023-09-22 NOTE — Transfer of Care (Signed)
Immediate Anesthesia Transfer of Care Note  Patient: Laurie [name]  Procedure(s) Performed: TRANSESOPHAGEAL ECHOCARDIOGRAM  Patient Location: PACU and Cath Lab  Anesthesia Type:MAC  Level of Consciousness: awake, alert , and oriented  Airway & Oxygen Therapy: Patient Spontanous Breathing  Post-op Assessment: Report given to RN and Post -op Vital signs reviewed and stable  Post vital signs: Reviewed and stable  Last Vitals:  Vitals Value Taken Time  BP 98/65   Temp    Pulse 79 09/22/23 0921  Resp 10 09/22/23 0921  SpO2 100 % 09/22/23 0921  Vitals shown include unfiled device data.  Last Pain:  Vitals:   09/22/23 0828  TempSrc: Oral  PainSc:       Patients Stated Pain Goal: 0 (09/20/23 2106)  Complications: There were no known notable events for this encounter.

## 2023-09-22 NOTE — Progress Notes (Signed)
7 Days Post-Op Procedure(s) (LRB): IRRIGATION AND DEBRIDEMENT KNEE (Right)  Subjective:  Patient reports knee pain as minimal, moves leg around to demonstrate.  Slept ok.  Lying comfortably in bed this morning.  Denies distal n/t.    Per nursing staff, has not been interested in having labs redrawn.    Worked with OT 11/29, declined PT because "wasn't feeling like it".  Hopeful for participation today.    On Cefazolin per I&D.    Has TEE scheduled this AM to assess for endocarditis.    No new concerns or overnight events.    Objective:   VITALS:   Vitals:   09/21/23 1131 09/21/23 1740 09/21/23 1941 09/22/23 0515  BP: 109/81 (!) 89/47 (!) 98/45 (!) 100/50  Pulse: 92 67 73 73  Resp: 20 16 16 16   Temp: 99.1 F (37.3 C) 98.7 F (37.1 C) 99.4 F (37.4 C) 99.2 F (37.3 C)  TempSrc: Oral Oral    SpO2: 98% 99% 100% 98%  Weight:      Height:        AAOx4, sitting comfortably in bed in NAD MSK: Sensation intact distally Intact pulses distally, good perfusion Dorsiflexion/Plantar flexion intact Incision: dressing C/D/I.  No surrounding erythema or tracking of RLE.  Bandages on RUE without erythema or drainage. Compartments soft ROM: RLE comfortably tolerates gentle ROM, no pain elicited with passive flexion, demonstrates good strength  Wiggles toes appropriately    Lab Results  Component Value Date   WBC 8.1 09/19/2023   HGB 11.3 (L) 09/19/2023   HCT 35.1 (L) 09/19/2023   MCV 75.5 (L) 09/19/2023   PLT 260 09/19/2023   BMET    Component Value Date/Time   NA 136 09/19/2023 1929   NA 140 01/09/2016 1538   K 4.6 09/19/2023 1929   CL 102 09/19/2023 1929   CO2 25 09/19/2023 1929   GLUCOSE 74 09/19/2023 1929   BUN 21 (H) 09/19/2023 1929   BUN 13 01/09/2016 1538   CREATININE 0.78 09/19/2023 1929   CALCIUM 7.9 (L) 09/19/2023 1929   GFRNONAA >60 09/19/2023 1929    Xray: none  Assessment/Plan: 7 Days Post-Op   Principal Problem:   Streptococcal  bacteremia Active Problems:   Abscess of upper extremity   Septic arthritis of knee, right (HCC)   Multiple lung nodules-secondary to septic emboli   Acute deep vein thrombosis (DVT) of axillary vein of right upper extremity (HCC)   Microcytic anemia   Transaminitis   IVDU (intravenous drug user)   Thyroid nodule   Tobacco dependence due to cigarettes   Opiate use  S/p R knee I&D 11/25  11/27: Drain removed, change drain dressing PRN if saturated.  Continue abx per ID, cultures pending.  Okay to resume full-dose DVT anticoagulation per medicine.  12/2: Continue with PT/OT.  Bandages look okay, no drainage.  Moving RLE comfortably, reassuring.  Can change ace wrap as needed.  Hgb appears stable, continue DVT full dose for RUE DVT per medicine.  Post op recs: WB: WBAT Abx: Continue antibiotics per infectious disease -- per notes Linezolid stopped, now just on Cefazolin Imaging: None Dressing: keep intact until follow up though can change ace wrap as needed.  Drain removed post-operatively. DVT prophylaxis: Okay to resume prophylaxis anticoagulation postop day 1, would not start full dose anticoagulation for at least 24 hours pending drain output. Follow up: 2 weeks after surgery for a wound check with Dr. Blanchie Dessert at [facility].  Address: [address]  [address], [place], [place] [address]  Office Phone: [phone]    Laurie [name] 09/22/2023, 7:25 AM    Contact information:   Weekdays 7am-5pm epic message Dr. Blanchie Dessert, or call office for patient follow up: [phone] After hours and holidays please check [email or web address] for group call information for [facility]

## 2023-09-22 NOTE — Assessment & Plan Note (Signed)
New this admission.  - Continue Xarelto for 3-6 months

## 2023-09-22 NOTE — Op Note (Signed)
INDICATIONS: bacteremia  PROCEDURE:   Informed consent was obtained prior to the procedure. The risks, benefits and alternatives for the procedure were discussed and the patient comprehended these risks.  Risks include, but are not limited to, cough, sore throat, vomiting, nausea, somnolence, esophageal and stomach trauma or perforation, bleeding, low blood pressure, aspiration, pneumonia, infection, trauma to the teeth and death.    After a procedural time-out, the oropharynx was anesthetized with 20% benzocaine spray.   During this procedure the patient was administered IV propofol by Anesthesiology.  The transesophageal probe was inserted in the esophagus and stomach without difficulty and multiple views were obtained.  The patient was kept under observation until the patient left the procedure room.  The patient left the procedure room in stable condition.   Agitated microbubble saline contrast was not administered.  COMPLICATIONS:    There were no immediate complications.  FINDINGS:  Normal TEE. No vegetations seen.    Time Spent Directly with the Patient:    Laurie [name] 09/22/2023, 9:18 AM

## 2023-09-22 NOTE — Progress Notes (Signed)
  Progress Note   Patient: Laurie [name] Service ION:[id number] DOB: [birth year] DOA: 09/14/2023     7 DOS: the patient was seen and examined on 09/22/2023 at 10:46AM      Brief hospital course: 27 y.o. F with hx IVDA and polysubstance abuse, hx familial factor V Leiden, smoking, PTSD and chronic hep C who presented with malaise, chest discomfort, draining arm wounds and right knee swelling.   Found to have group A strep bacteremia, pulmonary nodules, right arm DVT, right knee septic arthritis.     Assessment and Plan: * Group A streptococcal bacteremia Possible endocarditis TEE negative but constellation of streptococcal bacteremia, right knee septic arthritis and lung nodules implies endocarditis.  ID recommend 2 weeks IV antibiotics then 4 more weeks oral antibiotics. - Continue cefazolin until Dec 10 then oral therapy with EOT Jan 7 - Consult ID    Recurrent fever Spiked fever to 102 last 3 days, none in the last 24 hours.  Repeat cultures negative.  No new metastatic focus.  CXR showed no aspiration.  Unclear source.    Septic arthritis of knee, right (HCC) S/p irrigation and debridement right knee by Dr. Blanchie Dessert 11/25 Aspirated by Dr. Madilyn Hook in the ER 11/25.  Aspirate with 63K WBCs, gram stain positive but culture negative.  Taken to OR for washout Drain now removed.  Multiple lung nodules-secondary to presumed septic emboli - Repeat imaging as an outpatient per ID  Acute deep vein thrombosis (DVT) of axillary vein of right upper extremity (HCC) New this admission.  - Continue Xarelto for 3-6 months  IVDU (intravenous drug user), polysubstance abuse and withdrawal - Continue Suboxone - Consult TOC  Thyroid nodule Incidental finding, TSH normal - Outpatient thyroid US recommended  Microcytic anemia Hgb stable in the 7s - Continue ferrous sulfate  Hepatitis C Mild transaminitis US liver unremarkable - Follow up with ID for treatment          Subjective:  Patient had no fever overnight.  She is comfortable feels like she is improving.  She has some chest discomfort and coughing, but these are tolerable.     Physical Exam: BP (!) 109/38 (BP Location: Left Leg)   Pulse 66   Temp 98.9 F (37.2 C)   Resp 16   Ht 5\' 3"  (1.6 m)   Wt 62.4 kg   LMP 07/29/2023 (Approximate) Comment: Negative urine pregnancy test 09-14-2023                     None in November 2024 yet.  SpO2 99%   BMI 24.37 kg/m   Adult female, sitting up in bed, interactive and appropriate RRR, no murmurs, no peripheral edema Respiratory rate normal, lungs clear without rales or wheezes Abdomen soft without tenderness palpation or guarding, no ascites or distention Attention normal, affect appropriate, judgment and insight appear normal    Data Reviewed: Discussed with infectious disease Chest x-ray negative CBC shows stable anemia  Family Communication: None present    Disposition: Status is: Inpatient         Author: Alberteen Sam, MD 09/22/2023 3:05 PM  For on call review [email or web address].

## 2023-09-22 NOTE — Anesthesia Postprocedure Evaluation (Signed)
Anesthesia Post Note  Patient: Laurie [name]  Procedure(s) Performed: TRANSESOPHAGEAL ECHOCARDIOGRAM     Patient location during evaluation: PACU Anesthesia Type: MAC Level of consciousness: awake and alert Pain management: pain level controlled Vital Signs Assessment: post-procedure vital signs reviewed and stable Respiratory status: spontaneous breathing, nonlabored ventilation, respiratory function stable and patient connected to nasal cannula oxygen Cardiovascular status: stable and blood pressure returned to baseline Postop Assessment: no apparent nausea or vomiting Anesthetic complications: no  There were no known notable events for this encounter.  Last Vitals:  Vitals:   09/22/23 0935 09/22/23 0940  BP: (!) 133/45 (!) 133/45  Pulse: 74 75  Resp: 12 17  Temp:    SpO2: 96% 100%    Last Pain:  Vitals:   09/22/23 0940  TempSrc:   PainSc: 0-No pain                 Shelton Silvas

## 2023-09-22 NOTE — Assessment & Plan Note (Signed)
-   Repeat imaging as an outpatient per ID

## 2023-09-22 NOTE — Assessment & Plan Note (Signed)
-   Continue Suboxone - Consult TOC

## 2023-09-23 DIAGNOSIS — R509 Fever, unspecified: Secondary | ICD-10-CM | POA: Diagnosis not present

## 2023-09-23 DIAGNOSIS — R7881 Bacteremia: Secondary | ICD-10-CM | POA: Diagnosis not present

## 2023-09-23 DIAGNOSIS — I82A11 Acute embolism and thrombosis of right axillary vein: Secondary | ICD-10-CM | POA: Diagnosis not present

## 2023-09-23 DIAGNOSIS — R918 Other nonspecific abnormal finding of lung field: Secondary | ICD-10-CM | POA: Diagnosis not present

## 2023-09-23 DIAGNOSIS — B950 Streptococcus, group A, as the cause of diseases classified elsewhere: Secondary | ICD-10-CM | POA: Diagnosis not present

## 2023-09-23 DIAGNOSIS — B182 Chronic viral hepatitis C: Secondary | ICD-10-CM | POA: Diagnosis not present

## 2023-09-23 DIAGNOSIS — M00261 Other streptococcal arthritis, right knee: Secondary | ICD-10-CM | POA: Diagnosis not present

## 2023-09-23 DIAGNOSIS — L02419 Cutaneous abscess of limb, unspecified: Secondary | ICD-10-CM | POA: Diagnosis not present

## 2023-09-23 MED ORDER — IBUPROFEN 200 MG PO TABS
200.0000 mg | ORAL_TABLET | Freq: Once | ORAL | Status: AC
  Administered 2023-09-23: 200 mg via ORAL
  Filled 2023-09-23: qty 1

## 2023-09-23 MED ORDER — METHOCARBAMOL 500 MG PO TABS
500.0000 mg | ORAL_TABLET | Freq: Once | ORAL | Status: AC
  Administered 2023-09-23: 500 mg via ORAL
  Filled 2023-09-23: qty 1

## 2023-09-23 MED ORDER — METHOCARBAMOL 500 MG PO TABS
500.0000 mg | ORAL_TABLET | Freq: Four times a day (QID) | ORAL | Status: DC | PRN
  Administered 2023-09-23 – 2023-09-30 (×23): 500 mg via ORAL
  Filled 2023-09-23 (×23): qty 1

## 2023-09-23 NOTE — Progress Notes (Signed)
Physical Therapy Treatment Patient Details Name: Laurie [name] MRN: [id number] DOB: 05/23/1996 Today's Date: 09/23/2023   History of Present Illness 27 y.o. F with hx IVDA and polysubstance abuse, hx familial factor V Leiden, smoking, PTSD and chronic hep C who presented with malaise, chest discomfort, draining arm wounds and right knee swelling.   Found to have group A strep bacteremia, pulmonary nodules, right arm DVT, right knee septic arthritis.    PT Comments  Pt agreeable to PT after speaking with MD.  Pt received Robaxin prior to session.  Pt reporting pain at 9/10 but good effort with exercises and demonstrated improved ROM at end of session.  Sat EOB but did decline OOB activity -  educated on ambulation techniques for pain control. Will continue to benefit from acute PT.  May benefit from outpt PT at d/c (if possible) for R knee.    If plan is discharge home, recommend the following: A little help with walking and/or transfers;Assistance with cooking/housework;Assist for transportation;Help with stairs or ramp for entrance;A little help with bathing/dressing/bathroom   Can travel by private vehicle        Equipment Recommendations  Rolling walker (2 wheels)    Recommendations for Other Services       Precautions / Restrictions Precautions Precautions: Fall Restrictions Weight Bearing Restrictions: Yes RLE Weight Bearing: Weight bearing as tolerated     Mobility  Bed Mobility Overal bed mobility: Needs Assistance Bed Mobility: Supine to Sit, Sit to Supine     Supine to sit: Contact guard Sit to supine: Contact guard assist   General bed mobility comments: LIght guarding of R LE in case pt needed assist for pain control    Transfers Overall transfer level: Needs assistance   Transfers: Bed to chair/wheelchair/BSC            Lateral/Scoot Transfers: Contact guard assist General transfer comment: Lateral scoot at EOB.  Declined standing (reports having a bad  night and increased pain). Has been using BSC at times with NT but also bed pan when declines OOB.    Ambulation/Gait               General Gait Details: Declined ambulation.  Did educate on step to gait for pain control and RW use.  Set RW to correct height   Stairs             Wheelchair Mobility     Tilt Bed    Modified Rankin (Stroke Patients Only)       Balance Overall balance assessment: Needs assistance Sitting-balance support: No upper extremity supported Sitting balance-Leahy Scale: Good                                      Cognition Arousal: Alert Behavior During Therapy: WFL for tasks assessed/performed Overall Cognitive Status: Within Functional Limits for tasks assessed                                            Exercises Total Joint Exercises Long Arc Quad: AAROM, Right, 5 reps, Seated, Limitations Long Arc Quad Limitations: min A; at end range placed on therapist knee for 10 sec manual stretch, cues for breathing Knee Flexion: AAROM, Right, 5 reps, Seated Knee Flexion Limitations: Performed with bed elevated to reduce friction, AAROM  to stretch, 10 sec hold  General Exercises - Lower Extremity Ankle Circles/Pumps: AROM, Both, 10 reps, Supine (heel cord stretch on R 5 sec x 3) Quad Sets: AROM, Right, 10 reps, Supine Other Exercises Other Exercises: Educated on resting with leg straight, working on quad sets, knee flexion, and SLR as able.  Goniometric ROM: ROM ~15 to 50 degrees (prior to session knee ext ~25 degrees)    General Comments        Pertinent Vitals/Pain Pain Assessment Pain Assessment: 0-10 Pain Score: 9  Pain Location: right knee Pain Descriptors / Indicators: Discomfort, Burning, Throbbing Pain Intervention(s): Limited activity within patient's tolerance, Monitored during session, Premedicated before session (received robaxin prior)    Home Living                           Prior Function            PT Goals (current goals can now be found in the care plan section) Progress towards PT goals: Progressing toward goals    Frequency    Min 1X/week      PT Plan      Co-evaluation              AM-PAC PT "6 Clicks" Mobility   Outcome Measure  Help needed turning from your back to your side while in a flat bed without using bedrails?: None Help needed moving from lying on your back to sitting on the side of a flat bed without using bedrails?: A Little Help needed moving to and from a bed to a chair (including a wheelchair)?: A Little Help needed standing up from a chair using your arms (e.g., wheelchair or bedside chair)?: A Little Help needed to walk in hospital room?: Total Help needed climbing 3-5 steps with a railing? : Total 6 Click Score: 15    End of Session   Activity Tolerance: Patient tolerated treatment well Patient left: in bed;with bed alarm set;with call bell/phone within reach Nurse Communication: Mobility status PT Visit Diagnosis: Unsteadiness on feet (R26.81);Pain;Difficulty in walking, not elsewhere classified (R26.2) Pain - Right/Left: Right Pain - part of body: Knee     Time: 5366-4403 PT Time Calculation (min) (ACUTE ONLY): 15 min  Charges:    $Therapeutic Exercise: 8-22 mins PT General Charges $$ ACUTE PT VISIT: 1 Visit                     Anise Salvo, PT Acute Rehab [facility] [phone]    Rayetta Humphrey 09/23/2023, 5:27 PM

## 2023-09-23 NOTE — Progress Notes (Signed)
PT Cancellation Note  Patient Details Name: Laurie [name] MRN: [id number] DOB: 03/08/1996   Cancelled Treatment:    Reason Eval/Treat Not Completed: Patient declined, no reason specified Pt refused therapy again today due to "really sore, don't feel like it." PT provided education on risk of inactivity including but not limited to developing further DVT, PNE, weakness, and loss of mobility in R LE.  Pt reports she is moving her R knee and then demonstrated but was more hip ER/IR.  Educated on risk of developing contractures and needing to work on straightening knee as she is keeping flexed - she states "too sore right now."  Did discuss that was the same thing she told therapy last visit and that needs to start increasing activity.  Reports has been up to [facility] (NT reports refused BSC, used bed pan).  Pt kept head covered with blanket entire time.  Will f/u as able.  Anise Salvo, PT Acute Rehab [facility] [phone]    Rayetta Humphrey 09/23/2023, 2:36 PM

## 2023-09-23 NOTE — Plan of Care (Signed)

## 2023-09-23 NOTE — Plan of Care (Signed)
  Problem: Education: Goal: Knowledge of General Education information will improve Description: Including pain rating scale, medication(s)/side effects and non-pharmacologic comfort measures Outcome: Progressing   Problem: Health Behavior/Discharge Planning: Goal: Ability to manage health-related needs will improve Outcome: Progressing   Problem: Clinical Measurements: Goal: Ability to maintain clinical measurements within normal limits will improve Outcome: Progressing Goal: Will remain free from infection Outcome: Progressing Goal: Diagnostic test results will improve Outcome: Progressing Goal: Cardiovascular complication will be avoided Outcome: Progressing   Problem: Activity: Goal: Risk for activity intolerance will decrease Outcome: Progressing   Problem: Nutrition: Goal: Adequate nutrition will be maintained Outcome: Progressing   Problem: Coping: Goal: Level of anxiety will decrease Outcome: Progressing   Problem: Elimination: Goal: Will not experience complications related to bowel motility Outcome: Progressing Goal: Will not experience complications related to urinary retention Outcome: Progressing   

## 2023-09-23 NOTE — Progress Notes (Signed)
Progress Note   Patient: Laurie [name] UXL:[id number] DOB: Jun 12, 1996 DOA: 09/14/2023     8 DOS: the patient was seen and examined on 09/23/2023 at 10:34AM      Brief hospital course: 27 y.o. F with hx IVDA and polysubstance abuse, hx familial factor V Leiden, smoking, PTSD and chronic hep C who presented with malaise, chest discomfort, draining arm wounds and right knee swelling.   Found to have group A strep bacteremia, pulmonary nodules, right arm DVT, right knee septic arthritis.     Assessment and Plan: * Group A streptococcal bacteremia Possible endocarditis TEE negative but constellation of streptococcal bacteremia, right knee septic arthritis and lung nodules implies endocarditis.  ID recommend 2 weeks IV antibiotics then 4 more weeks oral antibiotics. - Continue cefazolin (plan IV cefazolin until Dec 10 then oral therapy with EOT Jan 7) - Consult ID    Recurrent fever Spiked fever to 102 last 3 days, none in the last 24 hours.  Repeat cultures negative.  No new metastatic focus.  CXR showed no aspiration.  Unclear source.   Now resolved    Septic arthritis of knee, right (HCC) S/p irrigation and debridement right knee by Dr. Blanchie Dessert 11/25 Aspirated by Dr. Madilyn Hook in the ER 11/25.  Aspirate with 63K WBCs, gram stain positive but culture negative.  Taken to OR for washout Drain now removed. - WBAT - ROM as tolerated - PT - Leave tegaderms in place for 2 weeks or unless removed by Orthopedic surgery; sutures to be removed at 2-3 weeks (I.e. Dec 9-16)  Multiple lung nodules-secondary to presumed septic emboli - Repeat imaging as an outpatient per ID  Acute deep vein thrombosis (DVT) of axillary vein of right upper extremity (HCC) New this admission.  - Continue Xarelto for 3-6 months  IVDU (intravenous drug user), polysubstance abuse and withdrawal - Continue Suboxone - Consult TOC  Thyroid nodule Incidental finding, TSH normal - Outpatient thyroid US  recommended  Microcytic anemia Hgb stable in the 7s - Continue ferrous sulfate  Hepatitis C Mild transaminitis US liver unremarkable - Follow up with ID for treatment          Subjective: Feeling okay.  Knee has a decent sized effusion and she is too apprehensive to bear weight.  No fever overnight.  No confusion, rashes are better, overall improving.     Physical Exam: BP (!) 127/39 (BP Location: Left Leg)   Pulse (!) 57   Temp 98 F (36.7 C) (Oral)   Resp 16   Ht 5\' 3"  (1.6 m)   Wt 62.4 kg   LMP 07/29/2023 (Approximate) Comment: Negative urine pregnancy test 09-14-2023                     None in November 2024 yet.  SpO2 100%   BMI 24.37 kg/m   Adult female, lying in bed, appropriate interactions RRR, no murmurs, no peripheral edema Respiratory rate normal, lungs clear without rales or wheezes The Ace wrap is taken down, she has no redness of the right knee, although there is a fairly large effusion, soft.  She resists passive range of motion because of pain Abdomen soft without tenderness palpation or guarding Attention normal, affect appropriate, judgment and insight appear normal, upper extremity strength normal Skin wounds are inspected and there is no drainage or surrounding cellulitis    Data Reviewed: Discussed with orthopedics and infectious disease Blood cultures from 12/1 remain negative   Family Communication: None present  Disposition: Status is: Inpatient The patient patient was admitted with group a strep bacteremia, possibly endocarditis and septic arthritis  ID have recommended 2 weeks of IV antibiotics in hospital, followed by 4 weeks of oral antibiotics         Author: Alberteen Sam, MD 09/23/2023 4:58 PM  For on call review [email or web address].

## 2023-09-24 DIAGNOSIS — B955 Unspecified streptococcus as the cause of diseases classified elsewhere: Secondary | ICD-10-CM | POA: Diagnosis not present

## 2023-09-24 DIAGNOSIS — R7881 Bacteremia: Secondary | ICD-10-CM | POA: Diagnosis not present

## 2023-09-24 LAB — CBC
HCT: 25.2 % — ABNORMAL LOW (ref 36.0–46.0)
Hemoglobin: 7.5 g/dL — ABNORMAL LOW (ref 12.0–15.0)
MCH: 24.4 pg — ABNORMAL LOW (ref 26.0–34.0)
MCHC: 29.8 g/dL — ABNORMAL LOW (ref 30.0–36.0)
MCV: 82.1 fL (ref 80.0–100.0)
Platelets: 383 10*3/uL (ref 150–400)
RBC: 3.07 MIL/uL — ABNORMAL LOW (ref 3.87–5.11)
RDW: 20 % — ABNORMAL HIGH (ref 11.5–15.5)
WBC: 6.4 10*3/uL (ref 4.0–10.5)
nRBC: 0 % (ref 0.0–0.2)

## 2023-09-24 LAB — COMPREHENSIVE METABOLIC PANEL
ALT: 34 U/L (ref 0–44)
AST: 53 U/L — ABNORMAL HIGH (ref 15–41)
Albumin: 2.5 g/dL — ABNORMAL LOW (ref 3.5–5.0)
Alkaline Phosphatase: 160 U/L — ABNORMAL HIGH (ref 38–126)
Anion gap: 9 (ref 5–15)
BUN: 13 mg/dL (ref 6–20)
CO2: 26 mmol/L (ref 22–32)
Calcium: 8.5 mg/dL — ABNORMAL LOW (ref 8.9–10.3)
Chloride: 102 mmol/L (ref 98–111)
Creatinine, Ser: 0.58 mg/dL (ref 0.44–1.00)
GFR, Estimated: >60 mL/min (ref >60)
Glucose, Bld: 97 mg/dL (ref 70–99)
Potassium: 4.2 mmol/L (ref 3.5–5.1)
Sodium: 137 mmol/L (ref 135–145)
Total Bilirubin: 0.6 mg/dL (ref <1.2)
Total Protein: 7.8 g/dL (ref 6.5–8.1)

## 2023-09-24 LAB — WET PREP, GENITAL
Clue Cells Wet Prep HPF POC: NONE SEEN
Sperm: NONE SEEN
WBC, Wet Prep HPF POC: <10 (ref <10)
Yeast Wet Prep HPF POC: NONE SEEN

## 2023-09-24 MED ORDER — METRONIDAZOLE 500 MG PO TABS
500.0000 mg | ORAL_TABLET | Freq: Two times a day (BID) | ORAL | Status: AC
  Administered 2023-09-24 – 2023-09-27 (×7): 500 mg via ORAL
  Filled 2023-09-24 (×7): qty 1

## 2023-09-24 MED ORDER — CLOTRIMAZOLE 1 % VA CREA
1.0000 | TOPICAL_CREAM | Freq: Every day | VAGINAL | Status: DC
  Administered 2023-09-24 – 2023-09-28 (×5): 1 via VAGINAL
  Filled 2023-09-24 (×2): qty 45

## 2023-09-24 MED ORDER — ACETAMINOPHEN 325 MG PO TABS
650.0000 mg | ORAL_TABLET | Freq: Four times a day (QID) | ORAL | Status: DC | PRN
  Administered 2023-09-24 – 2023-09-30 (×14): 650 mg via ORAL
  Filled 2023-09-24 (×15): qty 2

## 2023-09-24 MED ORDER — ACETAMINOPHEN 650 MG RE SUPP
650.0000 mg | Freq: Four times a day (QID) | RECTAL | Status: DC | PRN
Start: 2023-09-24 — End: 2023-09-30

## 2023-09-24 MED ORDER — OXYCODONE HCL 5 MG PO TABS: 5.0000 mg | ORAL_TABLET | Freq: Once | ORAL | Status: DC

## 2023-09-24 NOTE — Progress Notes (Signed)
    Patient Name: Laurie [name]           DOB: 30-Mar-1996  MRN: [id number]      Admission Date: 09/14/2023  Attending Provider: Hughie Closs, MD  Primary Diagnosis: Streptococcal bacteremia   Level of care: Med-Surg    CROSS COVER NOTE   Date of Service   09/24/2023   [name], 27 y.o. female, was admitted on 09/14/2023 for Streptococcal bacteremia.    HPI/Events of Note   Patient complains of vaginal irritation and itchiness.  No complaints of dysuria or bleeding. Redness to vulvar and vaginal tissue. Patient has been on IV cefazolin for endocarditis.   Interventions/ Plan   Will obtain UA and wet prep. Clotrimazole         Anthoney Harada, DNP, ACNPC- AG [facility] [place]

## 2023-09-24 NOTE — Progress Notes (Signed)
9 Days Post-Op Procedure(s) (LRB): IRRIGATION AND DEBRIDEMENT KNEE (Right)  Subjective:  Patient reports knee pain as minimal, moves leg around to demonstrate.  Slept ok.  Lying comfortably in bed this morning.  Denies distal n/t.    Hgb 7.6 on 12/2.  CBC today pending.  Continues to decline overall participation and/or ambulation stating she "doesn't feel like it".    On IV Cefazolin per I&D, then likely transition to oral therapy 10/28/23.    TEE negative.    No fevers in last 1-2 days.  No new complaints or overnight events.    Objective:   VITALS:   Vitals:   09/23/23 0456 09/23/23 1227 09/23/23 1944 09/24/23 0521  BP: (!) 150/66 (!) 127/39 116/64 (!) 140/66  Pulse: 67 (!) 57 72 69  Resp: 18 16 16 18   Temp: 98.3 F (36.8 C) 98 F (36.7 C) 99.3 F (37.4 C) 99.9 F (37.7 C)  TempSrc:  Oral    SpO2: 99% 100% 99% 98%  Weight:      Height:        AAOx4, sitting comfortably in bed in NAD MSK: Sensation intact distally Intact pulses distally, good perfusion Dorsiflexion/Plantar flexion intact, wiggles toes appropriately Incision: dressing C/D/I.  Dressings of right knee removed, wounds inspected.  Sutures intact, no drainage, no surrounding erythema.  Bandages on RUE without erythema or drainage.  Bandages reapplied. Compartments soft ROM: hip, knee, ankle of RLE comfortably tolerates gentle ROM, no pain elicited with passive flexion of knee, demonstrates good strength  Right hip nontender.  No erythema, excessive warmth, or swelling.   Lab Results  Component Value Date   WBC 9.2 09/22/2023   HGB 7.6 (L) 09/22/2023   HCT 24.6 (L) 09/22/2023   MCV 78.6 (L) 09/22/2023   PLT 397 09/22/2023   BMET    Component Value Date/Time   NA 136 09/19/2023 1929   NA 140 01/09/2016 1538   K 4.6 09/19/2023 1929   CL 102 09/19/2023 1929   CO2 25 09/19/2023 1929   GLUCOSE 74 09/19/2023 1929   BUN 21 (H) 09/19/2023 1929   BUN 13 01/09/2016 1538   CREATININE 0.78  09/19/2023 1929   CALCIUM 7.9 (L) 09/19/2023 1929   GFRNONAA >60 09/19/2023 1929    Xray: none  Assessment/Plan: 2 Days Post-Op   Principal Problem:   Group A streptococcal bacteremia Active Problems:   Hepatitis C   Septic arthritis of knee, right (HCC)   Multiple lung nodules-secondary to presumed septic emboli   Acute deep vein thrombosis (DVT) of axillary vein of right upper extremity (HCC)   Microcytic anemia   IVDU (intravenous drug user), polysubstance abuse and withdrawal   Thyroid nodule   Recurrent fever  S/p R knee I&D 11/25  11/27: Drain removed, change drain dressing PRN if saturated.  Continue abx per ID, cultures pending.  Okay to resume full-dose DVT anticoagulation per medicine.  12/2: Continue with PT/OT.  Bandages look okay, no drainage.  Moving RLE comfortably, reassuring.  Can change ace wrap as needed.  Hgb appears stable, continue DVT full dose for RUE DVT per medicine.  12/4: Encouraged participation with PT/OT.  Hgb 7.6 on 12/2, CBC today pending.  On Xarelto for full dose anticoagulation of RUE DVT per medicine.  Continue abx per ID.  Bandages changed today, incisions nonconcerning -- can change as needed.  Sutures to be removed prior to discharge, likely around 09/29/24.    Post op recs: WB: WBAT Abx: Continue antibiotics  per infectious disease -- per notes Linezolid stopped, now just on IV Cefazolin Imaging: None Dressing: keep intact until follow up though can change ace wrap as needed.  Drain removed post-operatively. DVT prophylaxis: Okay to resume prophylaxis anticoagulation postop day 1, would not start full dose anticoagulation for at least 24 hours pending drain output. Follow up: 2 weeks after surgery for a wound check with Dr. Blanchie Dessert at [facility].  Address: [address], [place], [place] [address]  Office Phone: [phone]    Cecil Cobbs 09/24/2023, 6:39 AM   Contact information:   Weekdays  7am-5pm epic message Dr. Blanchie Dessert, or call office for patient follow up: [phone] After hours and holidays please check [email or web address] for group call information for [facility]

## 2023-09-24 NOTE — Plan of Care (Signed)

## 2023-09-24 NOTE — Progress Notes (Signed)
[facility]  Date of Admission:  09/14/2023    Principal Problem:   Group A streptococcal bacteremia Active Problems:   Hepatitis C   Septic arthritis of knee, right (HCC)   Multiple lung nodules-secondary to presumed septic emboli   Acute deep vein thrombosis (DVT) of axillary vein of right upper extremity (HCC)   Microcytic anemia   IVDU (intravenous drug user), polysubstance abuse and withdrawal   Thyroid nodule   Recurrent fever          Assessment: 27 year old female admitted with: #Group A strep bacteremia/SSTI/right knee septic arthritis concern for endocarditis given pulmonary nodule #Fever #Polysubstance abuse - She was initially cefazolin linezolid then transition to cefazolin alone #DVT of upper right arm - Defer to primary for anticoagulation - Given SSTI and fevers, patient notes no worsening of lesions. #Chronic hep C - Viral load for hepatitis C is elevated at 1.45 million.  INR slightly elevated.  1.4 - Management outpatient Recommendations: -Follow epeat blood cultures - - TEE no veg. Fever curve trending down/  - Discussed with pt: 2 weeks antibiotics from negative cultures EOT 12/10 given persistent fevers then transition to p.o. antibiotics(linezolid->cefadroxil) to complete total 6 weeks.  I am concerned that patient may have already embolized a vegetation given concern for metastatic disease in the setting of right knee septic arthritis and pulmonary nodules. No complatins today Microbiology:   Antibiotics: cefazolin Cultures: Blood 11/24 GAS 11/27 NG 11/30 Urine   Other   SUBJECTIVE: Resting inbed. No new complaints Interval: tmax afebrile overinght Review of Systems: Review of Systems  All other systems reviewed and are negative.    Scheduled Meds:  buprenorphine-naloxone  1 tablet Sublingual TID   ferrous sulfate  325 mg Oral Q breakfast   nicotine  21 mg Transdermal Daily   mouth rinse  15  mL Mouth Rinse 4 times per day   Rivaroxaban  15 mg Oral BID WC   Followed by   Melene Muller ON 10/08/2023] rivaroxaban  20 mg Oral Q supper   sodium chloride flush  3 mL Intravenous Q12H   Continuous Infusions:   ceFAZolin (ANCEF) IV 2 g (09/24/23 0513)   PRN Meds:.acetaminophen **OR** acetaminophen, methocarbamol, nicotine polacrilex, ondansetron **OR** ondansetron (ZOFRAN) IV, mouth rinse, senna-docusate, sodium chloride flush Allergies  Allergen Reactions   Keflex [Cephalexin] Hives   Penicillins Other (See Comments)    Results thru an allergy skin test Has patient had a PCN reaction causing immediate rash, facial/tongue/throat swelling, SOB or lightheadedness with hypotension: No Has patient had a PCN reaction causing severe rash involving mucus membranes or skin necrosis: No Has patient had a PCN reaction that required hospitalization No Has patient had a PCN reaction occurring within the last 10 years: No If all of the above answers are "NO", then may proceed with Cephalosporin use.      OBJECTIVE: Vitals:   09/23/23 0456 09/23/23 1227 09/23/23 1944 09/24/23 0521  BP: (!) 150/66 (!) 127/39 116/64 (!) 140/66  Pulse: 67 (!) 57 72 69  Resp: 18 16 16 18   Temp: 98.3 F (36.8 C) 98 F (36.7 C) 99.3 F (37.4 C) 99.9 F (37.7 C)  TempSrc:  Oral    SpO2: 99% 100% 99% 98%  Weight:      Height:       Body mass index is 24.37 kg/m.  Physical Exam Constitutional:      Appearance: Normal appearance.  HENT:  Head: Normocephalic and atraumatic.     Right Ear: Tympanic membrane normal.     Left Ear: Tympanic membrane normal.     Nose: Nose normal.     Mouth/Throat:     Mouth: Mucous membranes are moist.  Eyes:     Extraocular Movements: Extraocular movements intact.     Conjunctiva/sclera: Conjunctivae normal.     Pupils: Pupils are equal, round, and reactive to light.  Cardiovascular:     Rate and Rhythm: Normal rate and regular rhythm.     Heart sounds: No murmur  heard.    No friction rub. No gallop.  Pulmonary:     Effort: Pulmonary effort is normal.     Breath sounds: Normal breath sounds.  Abdominal:     General: Abdomen is flat.     Palpations: Abdomen is soft.  Musculoskeletal:        General: Normal range of motion.  Skin:    General: Skin is warm and dry.  Neurological:     General: No focal deficit present.     Mental Status: She is alert and oriented to person, place, and time.  Psychiatric:        Mood and Affect: Mood normal.       Lab Results Lab Results  Component Value Date   WBC 9.2 09/22/2023   HGB 7.6 (L) 09/22/2023   HCT 24.6 (L) 09/22/2023   MCV 78.6 (L) 09/22/2023   PLT 397 09/22/2023    Lab Results  Component Value Date   CREATININE 0.78 09/19/2023   BUN 21 (H) 09/19/2023   NA 136 09/19/2023   K 4.6 09/19/2023   CL 102 09/19/2023   CO2 25 09/19/2023    Lab Results  Component Value Date   ALT 77 (H) 09/19/2023   AST 36 09/19/2023   ALKPHOS 116 09/19/2023   BILITOT 1.1 09/19/2023        Danelle Earthly, MD [facility] 09/24/2023, 9:19 AM   I have personally spent 54 minutes involved in face-to-face and non-face-to-face activities for this patient on the day of the visit. Professional time spent includes the following activities: Preparing to see the patient (review of tests), Obtaining and/or reviewing separately obtained history (admission/discharge record), Performing a medically appropriate examination and/or evaluation , Ordering medications/tests/procedures, referring and communicating with other health care professionals, Documenting clinical information in the EMR, Independently interpreting results (not separately reported), Communicating results to the patient/family/caregiver, Counseling and educating the patient/family/caregiver and Care coordination (not separately reported).

## 2023-09-24 NOTE — Progress Notes (Signed)
RN offered to perform dressing change to BUE wounds. Pt removed old dressing and stated she would not like them dressed tonight. Will continue to monitor.

## 2023-09-24 NOTE — Progress Notes (Signed)
PROGRESS NOTE    Laurie [name]  ZOX:[id number] DOB: 1995/11/02 DOA: 09/14/2023 PCP: Pcp, No   Brief Narrative:  27 y.o. F with hx IVDA and polysubstance abuse, hx familial factor V Leiden, smoking, PTSD and chronic hep C who presented with malaise, chest discomfort, draining arm wounds and right knee swelling. Found to have group A strep bacteremia, pulmonary nodules, right arm DVT, right knee septic arthritis.  Details below.  Assessment & Plan:   Principal Problem:   Group A streptococcal bacteremia Active Problems:   Recurrent fever   Septic arthritis of knee, right (HCC)   Multiple lung nodules-secondary to presumed septic emboli   Acute deep vein thrombosis (DVT) of axillary vein of right upper extremity (HCC)   IVDU (intravenous drug user), polysubstance abuse and withdrawal   Hepatitis C   Microcytic anemia   Thyroid nodule  Group A streptococcal bacteremia Possible endocarditis TEE negative but constellation of streptococcal bacteremia, right knee septic arthritis and lung nodules implies endocarditis.  ID recommends treating as such with 2 weeks IV antibiotics then 4 more weeks oral antibiotics. - Continue cefazolin (plan IV cefazolin until Dec 10 then oral therapy with EOT Jan 7)   Recurrent fever Spiked fever to 102 last 3 days with last spike of 100.8 at 8:30 AM on 09/22/2023.  Afebrile ever since.  Repeat blood culture negative from 09/21/2023.   Septic arthritis of knee, right (HCC) S/p irrigation and debridement right knee by Dr. Blanchie Dessert 11/25 Aspirated by Dr. Madilyn Hook in the ER 11/25.  Aspirate with 63K WBCs, gram stain positive but culture negative.  Taken to OR for washout Drain now removed. - WBAT - ROM as tolerated - PT - Leave tegaderms in place for 2 weeks or unless removed by Orthopedic surgery; sutures to be removed at 2-3 weeks (I.e. Dec 9-16)   Multiple lung nodules-secondary to presumed septic emboli - Repeat imaging as an outpatient per ID   Acute  deep vein thrombosis (DVT) of axillary vein of right upper extremity (HCC) New this admission.  - Continue Xarelto for 3-6 months   IVDU (intravenous drug user), polysubstance abuse and withdrawal - Continue Suboxone - Consulted TOC   Thyroid nodule Incidental finding, TSH normal - Outpatient thyroid US recommended   Microcytic anemia Hgb stable in the 7s - Continue ferrous sulfate   Hepatitis C Mild transaminitis US liver unremarkable - Follow up with ID for treatment  DVT prophylaxis: Place TED hose Start: 09/15/23 0531   Code Status: Full Code  Family Communication:  None present at bedside.  Plan of care discussed with patient in length and he/she verbalized understanding and agreed with it.  Status is: Inpatient Remains inpatient appropriate because: History of IVDU, needs to be in the hospital till 09/30/2023 for IV antibiotics.   Estimated body mass index is 24.37 kg/m as calculated from the following:   Height as of this encounter: 5\' 3"  (1.6 m).   Weight as of this encounter: 62.4 kg.    Nutritional Assessment: Body mass index is 24.37 kg/m.Marland Kitchen Seen by dietician.  I agree with the assessment and plan as outlined below: Nutrition Status:        . Skin Assessment: I have examined the patient's skin and I agree with the wound assessment as performed by the wound care RN as outlined below:    Consultants:  Orthopedics and ID  Procedures:  As above  Antimicrobials:  Anti-infectives (From admission, onward)    Start     Dose/Rate  Route Frequency Ordered Stop   09/17/23 1000  linezolid (ZYVOX) tablet 600 mg        600 mg Oral Every 12 hours 09/17/23 0814 09/18/23 2224   09/16/23 1400  ceFAZolin (ANCEF) IVPB 2g/100 mL premix        2 g 200 mL/hr over 30 Minutes Intravenous Every 8 hours 09/15/23 1850     09/16/23 0600  levofloxacin (LEVAQUIN) IVPB 500 mg        500 mg 100 mL/hr over 60 Minutes Intravenous On call to O.R. 09/15/23 1428 09/15/23 1553    09/16/23 0600  vancomycin (VANCOCIN) IVPB 1000 mg/200 mL premix  Status:  Discontinued        1,000 mg 200 mL/hr over 60 Minutes Intravenous On call to O.R. 09/15/23 1428 09/15/23 1431   09/15/23 1945  linezolid (ZYVOX) IVPB 600 mg  Status:  Discontinued        600 mg 300 mL/hr over 60 Minutes Intravenous Every 12 hours 09/15/23 1850 09/17/23 0814   09/15/23 1800  vancomycin (VANCOREADY) IVPB 750 mg/150 mL  Status:  Discontinued        750 mg 150 mL/hr over 60 Minutes Intravenous Every 12 hours 09/15/23 0530 09/15/23 1850   09/15/23 1617  vancomycin (VANCOCIN) powder  Status:  Discontinued          As needed 09/15/23 1617 09/15/23 1806   09/15/23 1600  cefTRIAXone (ROCEPHIN) 2 g in sodium chloride 0.9 % 100 mL IVPB  Status:  Discontinued        2 g 200 mL/hr over 30 Minutes Intravenous Every 24 hours 09/15/23 1048 09/15/23 1850   09/15/23 1456  vancomycin (VANCOCIN) 1-5 GM/200ML-% IVPB       Note to Pharmacy: Minor, Anneita S: cabinet override      09/15/23 1456 09/15/23 1515   09/15/23 0900  meropenem (MERREM) 1 g in sodium chloride 0.9 % 100 mL IVPB  Status:  Discontinued        1 g 200 mL/hr over 30 Minutes Intravenous Every 8 hours 09/15/23 0534 09/15/23 1004   09/15/23 0530  vancomycin (VANCOCIN) IVPB 1000 mg/200 mL premix        1,000 mg 200 mL/hr over 60 Minutes Intravenous  Once 09/15/23 0528 09/15/23 0849   09/15/23 0045  meropenem (MERREM) 1 g in sodium chloride 0.9 % 100 mL IVPB        1 g 200 mL/hr over 30 Minutes Intravenous  Once 09/15/23 0032 09/15/23 0149         Subjective: Patient seen and examined.  She has no complaints.  Objective: Vitals:   09/23/23 0456 09/23/23 1227 09/23/23 1944 09/24/23 0521  BP: (!) 150/66 (!) 127/39 116/64 (!) 140/66  Pulse: 67 (!) 57 72 69  Resp: 18 16 16 18   Temp: 98.3 F (36.8 C) 98 F (36.7 C) 99.3 F (37.4 C) 99.9 F (37.7 C)  TempSrc:  Oral    SpO2: 99% 100% 99% 98%  Weight:      Height:       No intake or output  data in the 24 hours ending 09/24/23 0851 Filed Weights   09/17/23 0535 09/20/23 1900  Weight: 62.4 kg 62.4 kg    Examination:  General exam: Appears calm and comfortable  Respiratory system: Clear to auscultation. Respiratory effort normal. Cardiovascular system: S1 & S2 heard, RRR. No JVD, murmurs, rubs, gallops or clicks. No pedal edema. Gastrointestinal system: Abdomen is nondistended, soft and nontender. No organomegaly or masses  felt. Normal bowel sounds heard. Central nervous system: Alert and oriented. No focal neurological deficits. Extremities: Symmetric 5 x 5 power. Skin: No rashes, lesions or ulcers Psychiatry: Judgement and insight appear normal. Mood & affect appropriate.    Data Reviewed: I have personally reviewed following labs and imaging studies  CBC: Recent Labs  Lab 09/19/23 1929 09/22/23 1059  WBC 8.1 9.2  HGB 11.3* 7.6*  HCT 35.1* 24.6*  MCV 75.5* 78.6*  PLT 260 397   Basic Metabolic Panel: Recent Labs  Lab 09/19/23 1929  NA 136  K 4.6  CL 102  CO2 25  GLUCOSE 74  BUN 21*  CREATININE 0.78  CALCIUM 7.9*   GFR: Estimated Creatinine Clearance: 86.6 mL/min (by C-G formula based on SCr of 0.78 mg/dL). Liver Function Tests: Recent Labs  Lab 09/19/23 1929  AST 36  ALT 77*  ALKPHOS 116  BILITOT 1.1  PROT 6.5  ALBUMIN 2.0*   No results for input(s): "LIPASE", "AMYLASE" in the last 168 hours. No results for input(s): "AMMONIA" in the last 168 hours. Coagulation Profile: No results for input(s): "INR", "PROTIME" in the last 168 hours. Cardiac Enzymes: No results for input(s): "CKTOTAL", "CKMB", "CKMBINDEX", "TROPONINI" in the last 168 hours. BNP (last 3 results) No results for input(s): "PROBNP" in the last 8760 hours. HbA1C: No results for input(s): "HGBA1C" in the last 72 hours. CBG: No results for input(s): "GLUCAP" in the last 168 hours. Lipid Profile: No results for input(s): "CHOL", "HDL", "LDLCALC", "TRIG", "CHOLHDL",  "LDLDIRECT" in the last 72 hours. Thyroid Function Tests: No results for input(s): "TSH", "T4TOTAL", "FREET4", "T3FREE", "THYROIDAB" in the last 72 hours. Anemia Panel: No results for input(s): "VITAMINB12", "FOLATE", "FERRITIN", "TIBC", "IRON", "RETICCTPCT" in the last 72 hours. Sepsis Labs: No results for input(s): "PROCALCITON", "LATICACIDVEN" in the last 168 hours.  Recent Results (from the past 240 hour(s))  Resp panel by RT-PCR (RSV, Flu A&B, Covid) Urine, Clean Catch     Status: None   Collection Time: 09/15/23 12:45 AM   Specimen: Urine, Clean Catch; Nasal Swab  Result Value Ref Range Status   SARS Coronavirus 2 by RT PCR NEGATIVE NEGATIVE Final    Comment: (NOTE) SARS-CoV-2 target nucleic acids are NOT DETECTED.  The SARS-CoV-2 RNA is generally detectable in upper respiratory specimens during the acute phase of infection. The lowest concentration of SARS-CoV-2 viral copies this assay can detect is 138 copies/mL. A negative result does not preclude SARS-Cov-2 infection and should not be used as the sole basis for treatment or other patient management decisions. A negative result may occur with  improper specimen collection/handling, submission of specimen other than nasopharyngeal swab, presence of viral mutation(s) within the areas targeted by this assay, and inadequate number of viral copies(<138 copies/mL). A negative result must be combined with clinical observations, patient history, and epidemiological information. The expected result is Negative.  Fact Sheet for Patients:  BloggerCourse.com  Fact Sheet for Healthcare Providers:  SeriousBroker.it  This test is no t yet approved or cleared by the [place] FDA and  has been authorized for detection and/or diagnosis of SARS-CoV-2 by FDA under an Emergency Use Authorization (EUA). This EUA will remain  in effect (meaning this test can be used) for the duration of  the COVID-19 declaration under Section 564(b)(1) of the Act, 21 U.S.C.section 360bbb-3(b)(1), unless the authorization is terminated  or revoked sooner.       Influenza A by PCR NEGATIVE NEGATIVE Final   Influenza B by PCR NEGATIVE NEGATIVE  Final    Comment: (NOTE) The Xpert Xpress SARS-CoV-2/FLU/RSV plus assay is intended as an aid in the diagnosis of influenza from Nasopharyngeal swab specimens and should not be used as a sole basis for treatment. Nasal washings and aspirates are unacceptable for Xpert Xpress SARS-CoV-2/FLU/RSV testing.  Fact Sheet for Patients: BloggerCourse.com  Fact Sheet for Healthcare Providers: SeriousBroker.it  This test is not yet approved or cleared by the [place] FDA and has been authorized for detection and/or diagnosis of SARS-CoV-2 by FDA under an Emergency Use Authorization (EUA). This EUA will remain in effect (meaning this test can be used) for the duration of the COVID-19 declaration under Section 564(b)(1) of the Act, 21 U.S.C. section 360bbb-3(b)(1), unless the authorization is terminated or revoked.     Resp Syncytial Virus by PCR NEGATIVE NEGATIVE Final    Comment: (NOTE) Fact Sheet for Patients: BloggerCourse.com  Fact Sheet for Healthcare Providers: SeriousBroker.it  This test is not yet approved or cleared by the [place] FDA and has been authorized for detection and/or diagnosis of SARS-CoV-2 by FDA under an Emergency Use Authorization (EUA). This EUA will remain in effect (meaning this test can be used) for the duration of the COVID-19 declaration under Section 564(b)(1) of the Act, 21 U.S.C. section 360bbb-3(b)(1), unless the authorization is terminated or revoked.  Performed at [facility], [address], [place], [place] [address]   Blood Culture (routine x 2)     Status: Abnormal    Collection Time: 09/15/23 12:45 AM   Specimen: BLOOD LEFT FOREARM  Result Value Ref Range Status   Specimen Description BLOOD LEFT FOREARM  Final   Special Requests   Final    BOTTLES DRAWN AEROBIC AND ANAEROBIC Blood Culture adequate volume   Culture  Setup Time   Final    GRAM POSITIVE COCCI IN CHAINS IN BOTH AEROBIC AND ANAEROBIC BOTTLES CRITICAL RESULT CALLED TO, READ BACK BY AND VERIFIED WITH: PHARMD C. SHADE [id number] @ 1810 FH    Culture (A)  Final    GROUP A STREP (S.PYOGENES) ISOLATED SUSCEPTIBILITIES PERFORMED ON PREVIOUS CULTURE WITHIN THE LAST 5 DAYS. Performed at [facility], [address] [place], [place] [address]    Report Status 09/17/2023 FINAL  Final  Blood Culture (routine x 2)     Status: Abnormal   Collection Time: 09/15/23  1:03 AM   Specimen: BLOOD LEFT ARM  Result Value Ref Range Status   Specimen Description BLOOD LEFT ARM  Final   Special Requests   Final    BOTTLES DRAWN AEROBIC AND ANAEROBIC Blood Culture adequate volume   Culture  Setup Time   Final    GRAM POSITIVE COCCI IN CHAINS IN BOTH AEROBIC AND ANAEROBIC BOTTLES CRITICAL RESULT CALLED TO, READ BACK BY AND VERIFIED WITH: PHARMD C. SHADE [id number] @ 1810 FH    Culture (A)  Final    GROUP A STREP (S.PYOGENES) ISOLATED HEALTH DEPARTMENT NOTIFIED Performed at [facility], [address] [place], [place] [address]    Report Status 09/18/2023 FINAL  Final   Organism ID, Bacteria GROUP A STREP (S.PYOGENES) ISOLATED  Final      Susceptibility   Group a strep (s.pyogenes) isolated - MIC*    PENICILLIN <=0.06 SENSITIVE Sensitive     CEFTRIAXONE <=0.12 SENSITIVE Sensitive     ERYTHROMYCIN >=8 RESISTANT Resistant     LEVOFLOXACIN 1 SENSITIVE Sensitive     VANCOMYCIN 0.5 SENSITIVE Sensitive     * GROUP  A STREP (S.PYOGENES) ISOLATED  Blood Culture ID Panel (Reflexed)     Status: Abnormal   Collection Time: 09/15/23  1:03 AM  Result Value Ref Range Status   Enterococcus faecalis NOT  DETECTED NOT DETECTED Final   Enterococcus Faecium NOT DETECTED NOT DETECTED Final   Listeria monocytogenes NOT DETECTED NOT DETECTED Final   Staphylococcus species NOT DETECTED NOT DETECTED Final   Staphylococcus aureus (BCID) NOT DETECTED NOT DETECTED Final   Staphylococcus epidermidis NOT DETECTED NOT DETECTED Final   Staphylococcus lugdunensis NOT DETECTED NOT DETECTED Final   Streptococcus species DETECTED (A) NOT DETECTED Final    Comment: CRITICAL RESULT CALLED TO, READ BACK BY AND VERIFIED WITH: PHARMD C. SHADE [id number] @ 1810 FH    Streptococcus agalactiae NOT DETECTED NOT DETECTED Final   Streptococcus pneumoniae NOT DETECTED NOT DETECTED Final   Streptococcus pyogenes DETECTED (A) NOT DETECTED Final    Comment: CRITICAL RESULT CALLED TO, READ BACK BY AND VERIFIED WITH: PHARMD C. SHADE [id number] @ 1810 FH    A.calcoaceticus-baumannii NOT DETECTED NOT DETECTED Final   Bacteroides fragilis NOT DETECTED NOT DETECTED Final   Enterobacterales NOT DETECTED NOT DETECTED Final   Enterobacter cloacae complex NOT DETECTED NOT DETECTED Final   Escherichia coli NOT DETECTED NOT DETECTED Final   Klebsiella aerogenes NOT DETECTED NOT DETECTED Final   Klebsiella oxytoca NOT DETECTED NOT DETECTED Final   Klebsiella pneumoniae NOT DETECTED NOT DETECTED Final   Proteus species NOT DETECTED NOT DETECTED Final   Salmonella species NOT DETECTED NOT DETECTED Final   Serratia marcescens NOT DETECTED NOT DETECTED Final   Haemophilus influenzae NOT DETECTED NOT DETECTED Final   Neisseria meningitidis NOT DETECTED NOT DETECTED Final   Pseudomonas aeruginosa NOT DETECTED NOT DETECTED Final   Stenotrophomonas maltophilia NOT DETECTED NOT DETECTED Final   Candida albicans NOT DETECTED NOT DETECTED Final   Candida auris NOT DETECTED NOT DETECTED Final   Candida glabrata NOT DETECTED NOT DETECTED Final   Candida krusei NOT DETECTED NOT DETECTED Final   Candida parapsilosis NOT DETECTED NOT DETECTED Final    Candida tropicalis NOT DETECTED NOT DETECTED Final   Cryptococcus neoformans/gattii NOT DETECTED NOT DETECTED Final    Comment: Performed at [facility], [address], [place], [place] [address]  Aerobic/Anaerobic Culture w Gram Stain (surgical/deep wound)     Status: None   Collection Time: 09/15/23  1:43 AM   Specimen: BLOOD LEFT FOREARM; Abscess  Result Value Ref Range Status   Specimen Description   Final    BLOOD LEFT FOREARM Performed at [facility], [address] [place], [place] [address]    Special Requests   Final    NONE Performed at [facility], [address] [place], [place] [address]    Gram Stain   Final    FEW WBC PRESENT, PREDOMINANTLY MONONUCLEAR FEW GRAM POSITIVE COCCI IN PAIRS AND CHAINS FEW GRAM VARIABLE ROD    Culture   Final    FEW STREPTOCOCCUS PYOGENES FEW STREPTOCOCCUS GROUP C Beta hemolytic streptococci are predictably susceptible to penicillin and other beta lactams. Susceptibility testing not routinely performed. RARE PREVOTELLA BUCCAE BETA LACTAMASE POSITIVE Performed at [facility], [address], [place], [place] [address]    Report Status 09/18/2023 FINAL  Final  Body fluid culture w Gram Stain     Status: None   Collection Time: 09/15/23  2:37 AM   Specimen: KNEE; Body Fluid  Result Value Ref Range Status  Specimen Description   Final    KNEE RIGHT Performed at [facility], [address] [place], [place] [address]    Special Requests   Final    NONE Performed at [facility], [address] [place], [place] [address]    Gram Stain   Final    FEW WBC PRESENT,BOTH PMN AND MONONUCLEAR RARE GRAM POSITIVE COCCI IN PAIRS    Culture   Final    NO GROWTH 3 DAYS Performed at [facility], [address] [place], [place] [address]    Report Status 09/18/2023 FINAL  Final  Aerobic/Anaerobic Culture w Gram Stain (surgical/deep wound)      Status: None   Collection Time: 09/15/23  4:12 PM   Specimen: PATH Cytology Misc. fluid; Body Fluid  Result Value Ref Range Status   Specimen Description   Final    KNEE Performed at [facility], [address] [place], [place] [address]    Special Requests   Final    RIGHT Performed at [facility], [address], [place], [place] [address]    Gram Stain   Final    FEW WBC PRESENT,BOTH PMN AND MONONUCLEAR NO ORGANISMS SEEN    Culture   Final    No growth aerobically or anaerobically. Performed at [facility], [address] [place], [place] [address]    Report Status 09/21/2023 FINAL  Final  Culture, blood (Routine X 2) w Reflex to ID Panel     Status: None   Collection Time: 09/17/23  1:24 PM   Specimen: BLOOD LEFT HAND  Result Value Ref Range Status   Specimen Description   Final    BLOOD LEFT HAND Performed at [facility], [address] [place], [place] [address]    Special Requests   Final    BOTTLES DRAWN AEROBIC ONLY Blood Culture results may not be optimal due to an inadequate volume of blood received in culture bottles Performed at [facility], [address], [place], [place] [address]    Culture   Final    NO GROWTH 5 DAYS Performed at [facility], [address] [place], [place] [address]    Report Status 09/22/2023 FINAL  Final  Culture, blood (Routine X 2) w Reflex to ID Panel     Status: None   Collection Time: 09/17/23  1:26 PM   Specimen: BLOOD LEFT HAND  Result Value Ref Range Status   Specimen Description   Final    BLOOD LEFT HAND Performed at [facility], [address], [place], [place] [address]    Special Requests   Final    BOTTLES DRAWN AEROBIC ONLY Blood Culture results may not be optimal due to an inadequate volume of blood received in culture bottles Performed at [facility], [address] [place], [place] [address]     Culture   Final    NO GROWTH 5 DAYS Performed at [facility], [address] [place], [place] [address]    Report Status 09/22/2023 FINAL  Final  Culture, blood (Routine X 2) w Reflex to ID Panel     Status: None (Preliminary result)   Collection Time: 09/21/23  5:51 AM   Specimen: BLOOD LEFT HAND  Result Value Ref Range Status   Specimen Description   Final    BLOOD LEFT HAND Performed at [facility],  [address], [place], [place] [address]    Special Requests   Final    BOTTLES DRAWN AEROBIC AND ANAEROBIC Blood Culture results may not be optimal due to an inadequate volume of blood received in culture bottles Performed at [facility], [address], [place], [place] [address]    Culture   Final    NO GROWTH 3 DAYS Performed at [facility], [address] [place], [place] [address]    Report Status PENDING  Incomplete  Culture, blood (Routine X 2) w Reflex to ID Panel     Status: None (Preliminary result)   Collection Time: 09/21/23  5:54 AM   Specimen: BLOOD LEFT ARM  Result Value Ref Range Status   Specimen Description BLOOD LEFT ARM  Final   Special Requests   Final    BOTTLES DRAWN AEROBIC AND ANAEROBIC Blood Culture results may not be optimal due to an inadequate volume of blood received in culture bottles   Culture   Final    NO GROWTH 3 DAYS Performed at [facility], [address] [place], [place] [address]    Report Status PENDING  Incomplete     Radiology Studies: EP STUDY  Result Date: 09/22/2023 See surgical note for result.  ECHO TEE  Result Date: 09/22/2023    TRANSESOPHOGEAL ECHO REPORT   Patient Name:   Laurie [name] Date of Exam: 09/22/2023 Medical Rec #:  [id number]     Height:       63.0 in Accession #:    [id number]    Weight:       137.6 lb Date of Birth:  1996-10-08     BSA:          1.649 m Patient Age:    27 years      BP:           100/50 mmHg Patient Gender: F             HR:           73 bpm.  Exam Location:  Inpatient Procedure: Transesophageal Echo and Color Doppler Indications:     Bacteremia  History:         Patient has prior history of Echocardiogram examinations, most                  recent 09/15/2023. Hepatitis C; Risk Factors:IVDU and Current                  Smoker.  Sonographer:     [facility] Referring Phys:  [id number] Minda Meo HALEY Diagnosing Phys: Thurmon Fair MD  Sonographer Comments: TEE Probe [phone] used for procedure PROCEDURE: After discussion of the risks and benefits of a TEE, an informed consent was obtained from the patient. The transesophogeal probe was passed without difficulty through the esophogus of the patient. Imaged were obtained with the patient in a left lateral decubitus position. Sedation performed by different physician. The patient was monitored while under deep sedation. Anesthestetic sedation was provided intravenously by Anesthesiology: 154.6mg  of Propofol, 60mg  of Lidocaine. Image quality was good. The patient's vital signs; including heart rate, blood pressure, and oxygen saturation; remained stable throughout the procedure. The patient developed no complications during the procedure.  IMPRESSIONS  1. Left ventricular ejection fraction, by estimation, is 60 to 65%. The left ventricle has normal function. The left ventricle has no regional wall motion abnormalities.  2. Right ventricular systolic function is normal. The  right ventricular size is normal.  3. No left atrial/left atrial appendage thrombus was detected.  4. The mitral valve is normal in structure. No evidence of mitral valve regurgitation. No evidence of mitral stenosis.  5. The aortic valve is normal in structure. Aortic valve regurgitation is not visualized. No aortic stenosis is present.  6. The inferior vena cava is normal in size with greater than 50% respiratory variability, suggesting right atrial pressure of 3 mmHg. Conclusion(s)/Recommendation(s): Normal biventricular function without evidence of  hemodynamically significant valvular heart disease. FINDINGS  Left Ventricle: Left ventricular ejection fraction, by estimation, is 60 to 65%. The left ventricle has normal function. The left ventricle has no regional wall motion abnormalities. The left ventricular internal cavity size was normal in size. There is  no left ventricular hypertrophy. Right Ventricle: The right ventricular size is normal. No increase in right ventricular wall thickness. Right ventricular systolic function is normal. Left Atrium: Left atrial size was normal in size. No left atrial/left atrial appendage thrombus was detected. Right Atrium: Right atrial size was normal in size. Pericardium: There is no evidence of pericardial effusion. Mitral Valve: The mitral valve is normal in structure. No evidence of mitral valve regurgitation. No evidence of mitral valve stenosis. Tricuspid Valve: The tricuspid valve is normal in structure. Tricuspid valve regurgitation is not demonstrated. No evidence of tricuspid stenosis. Aortic Valve: The aortic valve is normal in structure. Aortic valve regurgitation is not visualized. No aortic stenosis is present. Pulmonic Valve: The pulmonic valve was normal in structure. Pulmonic valve regurgitation is not visualized. No evidence of pulmonic stenosis. Aorta: The aortic root is normal in size and structure. Venous: The inferior vena cava is normal in size with greater than 50% respiratory variability, suggesting right atrial pressure of 3 mmHg. IAS/Shunts: No atrial level shunt detected by color flow Doppler. Rachelle Hora Croitoru MD Electronically signed by Thurmon Fair MD Signature Date/Time: 09/22/2023/10:17:43 AM    Final     Scheduled Meds:  buprenorphine-naloxone  1 tablet Sublingual TID   Chlorhexidine Gluconate Cloth  6 each Topical Daily   ferrous sulfate  325 mg Oral Q breakfast   nicotine  21 mg Transdermal Daily   mouth rinse  15 mL Mouth Rinse 4 times per day   Rivaroxaban  15 mg Oral BID WC    Followed by   Melene Muller ON 10/08/2023] rivaroxaban  20 mg Oral Q supper   sodium chloride flush  3 mL Intravenous Q12H   sodium chloride flush  3 mL Intravenous Q12H   Continuous Infusions:   ceFAZolin (ANCEF) IV 2 g (09/24/23 0513)     LOS: 9 days   Hughie Closs, MD [facility]  09/24/2023, 8:51 AM   *Please note that this is a verbal dictation therefore any spelling or grammatical errors are due to the "Dragon Medical One" system interpretation.  Please page via Amion and do not message via secure chat for urgent patient care matters. Secure chat can be used for non urgent patient care matters.  How to contact the [facility] Attending or Consulting provider 7A - 7P or covering provider during after hours 7P -7A, for this patient?  Check the care team in [facility] and look for a) attending/consulting TRH provider listed and b) the [facility] team listed. Page or secure chat 7A-7P. Log into [email or web address] and use [place]'s universal password to access. If you do not have the password, please contact the hospital operator. Locate the [facility] provider you are looking for under [facility]  and page to a number that you can be directly reached. If you still have difficulty reaching the provider, please page the [facility] (Director on Call) for the Hospitalists listed on amion for assistance.

## 2023-09-24 NOTE — Progress Notes (Signed)
OT Cancellation Note  Patient Details Name: Laurie [name] MRN: [id number] DOB: 24-Feb-1996   Cancelled Treatment:    Reason Eval/Treat Not Completed: Patient declined, no reason specified. Attempted x 2.   Evern Bio 09/24/2023, 3:10 PM Berna Spare, OTR/L Acute Rehabilitation Services Office: [phone]

## 2023-09-25 ENCOUNTER — Inpatient Hospital Stay (HOSPITAL_COMMUNITY): Payer: MEDICAID

## 2023-09-25 DIAGNOSIS — B955 Unspecified streptococcus as the cause of diseases classified elsewhere: Secondary | ICD-10-CM | POA: Diagnosis not present

## 2023-09-25 DIAGNOSIS — R509 Fever, unspecified: Secondary | ICD-10-CM | POA: Diagnosis not present

## 2023-09-25 DIAGNOSIS — M00861 Arthritis due to other bacteria, right knee: Secondary | ICD-10-CM | POA: Diagnosis not present

## 2023-09-25 DIAGNOSIS — R7881 Bacteremia: Secondary | ICD-10-CM | POA: Diagnosis not present

## 2023-09-25 DIAGNOSIS — B951 Streptococcus, group B, as the cause of diseases classified elsewhere: Secondary | ICD-10-CM | POA: Diagnosis not present

## 2023-09-25 LAB — URINALYSIS, ROUTINE W REFLEX MICROSCOPIC
Bacteria, UA: NONE SEEN
Bilirubin Urine: NEGATIVE
Glucose, UA: NEGATIVE mg/dL
Hgb urine dipstick: NEGATIVE
Ketones, ur: NEGATIVE mg/dL
Nitrite: NEGATIVE
Protein, ur: NEGATIVE mg/dL
Specific Gravity, Urine: 1.008 (ref 1.005–1.030)
pH: 7 (ref 5.0–8.0)

## 2023-09-25 MED ORDER — OXYCODONE HCL 5 MG PO TABS
5.0000 mg | ORAL_TABLET | Freq: Every day | ORAL | Status: DC | PRN
  Administered 2023-09-26 – 2023-09-29 (×2): 5 mg via ORAL
  Filled 2023-09-25 (×3): qty 1

## 2023-09-25 MED ORDER — MELATONIN 5 MG PO TABS
5.0000 mg | ORAL_TABLET | Freq: Every evening | ORAL | Status: AC | PRN
  Administered 2023-09-25 – 2023-09-26 (×3): 5 mg via ORAL
  Filled 2023-09-25 (×3): qty 1

## 2023-09-25 MED ORDER — OXYCODONE HCL 5 MG PO TABS
5.0000 mg | ORAL_TABLET | Freq: Once | ORAL | Status: AC
  Administered 2023-09-25: 5 mg via ORAL
  Filled 2023-09-25: qty 1

## 2023-09-25 NOTE — Progress Notes (Signed)
Occupational Therapy Treatment Patient Details Name: Laurie [name] MRN: [id number] DOB: 01-Oct-1996 Today's Date: 09/25/2023   History of present illness 27 y.o. F with hx IVDA and polysubstance abuse, hx familial factor V Leiden, smoking, PTSD and chronic hep C who presented with malaise, chest discomfort, draining arm wounds and right knee swelling.   Found to have group A strep bacteremia, pulmonary nodules, right arm DVT, right knee septic arthritis.   OT comments  Pt premedicated 45 minutes prior to session. Ambulated with RW to nurse's station and back to room. Toileted in bathroom and groomed at sink mod I. Educated pt to keep R knee straight with pillow under ankle, demonstrated understanding. Pt pleased with her progress. Encouraged walking to the bathroom with RW, calling for help to manage IV pole when needed.       If plan is discharge home, recommend the following:      Equipment Recommendations  None recommended by OT    Recommendations for Other Services      Precautions / Restrictions Precautions Precautions: Fall Restrictions Weight Bearing Restrictions: No RLE Weight Bearing: Weight bearing as tolerated       Mobility Bed Mobility Overal bed mobility: Modified Independent                  Transfers Overall transfer level: Modified independent Equipment used: Rolling walker (2 wheels)                     Balance Overall balance assessment: Needs assistance Sitting-balance support: No upper extremity supported Sitting balance-Leahy Scale: Good       Standing balance-Leahy Scale: Poor                             ADL either performed or assessed with clinical judgement   ADL Overall ADL's : Needs assistance/impaired     Grooming: Wash/dry hands;Standing;Modified independent           Upper Body Dressing : Set up;Sitting       Toilet Transfer: Modified Independent;Ambulation;Rolling walker (2 wheels)   Toileting-  Clothing Manipulation and Hygiene: Modified independent;Sit to/from stand       Functional mobility during ADLs: Modified independent;Rolling walker (2 wheels)      Extremity/Trunk Assessment              Vision       Perception     Praxis      Cognition Arousal: Alert Behavior During Therapy: WFL for tasks assessed/performed Overall Cognitive Status: Within Functional Limits for tasks assessed                                          Exercises Other Exercises Other Exercises: educated to rest with R knee straight and folded pillow under ankle    Shoulder Instructions       General Comments      Pertinent Vitals/ Pain       Pain Assessment Pain Assessment: Faces Faces Pain Scale: Hurts a little bit Pain Location: right knee Pain Descriptors / Indicators: Discomfort Pain Intervention(s): Monitored during session, Repositioned, Premedicated before session  Home Living  Prior Functioning/Environment              Frequency  Min 1X/week        Progress Toward Goals  OT Goals(current goals can now be found in the care plan section)  Progress towards OT goals: Progressing toward goals  Acute Rehab OT Goals OT Goal Formulation: With patient Time For Goal Achievement: 10/03/23 Potential to Achieve Goals: Good  Plan      Co-evaluation                 AM-PAC OT "6 Clicks" Daily Activity     Outcome Measure   Help from another person eating meals?: None Help from another person taking care of personal grooming?: None Help from another person toileting, which includes using toliet, bedpan, or urinal?: None Help from another person bathing (including washing, rinsing, drying)?: A Little Help from another person to put on and taking off regular upper body clothing?: None Help from another person to put on and taking off regular lower body clothing?: A Little 6 Click  Score: 22    End of Session Equipment Utilized During Treatment: Rolling walker (2 wheels);Gait belt  OT Visit Diagnosis: Other abnormalities of gait and mobility (R26.89);Pain Pain - Right/Left: Right Pain - part of body: Knee   Activity Tolerance Patient tolerated treatment well   Patient Left in bed;with call bell/phone within reach   Nurse Communication Mobility status        Time: 7846-9629 OT Time Calculation (min): 27 min  Charges: OT General Charges $OT Visit: 1 Visit OT Treatments $[facility]cker Care/Home Management : 8-22 mins $Therapeutic Activity: 8-22 mins  Berna Spare, OTR/L Acute Rehabilitation Services Office: [phone]   Evern Bio 09/25/2023, 4:09 PM

## 2023-09-25 NOTE — Plan of Care (Signed)

## 2023-09-25 NOTE — Progress Notes (Signed)
[facility]  Date of Admission:  09/14/2023    Principal Problem:   Group A streptococcal bacteremia Active Problems:   Hepatitis C   Septic arthritis of knee, right (HCC)   Multiple lung nodules-secondary to presumed septic emboli   Acute deep vein thrombosis (DVT) of axillary vein of right upper extremity (HCC)   Microcytic anemia   IVDU (intravenous drug user), polysubstance abuse and withdrawal   Thyroid nodule   Recurrent fever          Assessment: 27 year old female admitted with: #Group A strep bacteremia/SSTI/right knee septic arthritis concern for endocarditis given pulmonary nodule #Fever #Polysubstance abuse - She was initially cefazolin linezolid then transition to cefazolin alone -TTE and TEE without vegetation #DVT of upper right arm - Defer to primary for anticoagulation - Given SSTI and fevers, patient notes no worsening of lesions. #Chronic hep C - Viral load for hepatitis C is elevated at 1.45 million.  INR slightly elevated.  1.4 - Management outpatient Recommendations: -It's interesting she continues to have fever spikes intermittenly. No complaints of back pain, SHOB, dysuria. She does have food that sh e eats in bed->aspiration? Will get CXR -  2 weeks antibiotics from negative cultures EOT 12/10 given persistent fevers then transition to p.o. antibiotics(linezolid->cefadroxil) to complete total 6 weeks.  I am concerned that patient may have already embolized a vegetation given concern for metastatic disease in the setting of right knee septic arthritis and pulmonary nodules. No complatins today Microbiology:   Antibiotics: cefazolin Cultures: Blood 11/24 GAS 11/27 NG 11/30 Urine   Other   SUBJECTIVE: Resting inbed. No new complaints Interval: Tmax 100.5 overnight. Review of Systems: Review of Systems  All other systems reviewed and are negative.    Scheduled Meds:  buprenorphine-naloxone  1 tablet  Sublingual TID   clotrimazole  1 Applicatorful Vaginal QHS   ferrous sulfate  325 mg Oral Q breakfast   metroNIDAZOLE  500 mg Oral Q12H   nicotine  21 mg Transdermal Daily   mouth rinse  15 mL Mouth Rinse 4 times per day   oxyCODONE  5 mg Oral Once   Rivaroxaban  15 mg Oral BID WC   Followed by   Melene Muller ON 10/08/2023] rivaroxaban  20 mg Oral Q supper   sodium chloride flush  3 mL Intravenous Q12H   Continuous Infusions:   ceFAZolin (ANCEF) IV 2 g (09/25/23 0506)   PRN Meds:.acetaminophen **OR** acetaminophen, melatonin, methocarbamol, nicotine polacrilex, ondansetron **OR** ondansetron (ZOFRAN) IV, mouth rinse, senna-docusate, sodium chloride flush Allergies  Allergen Reactions   Keflex [Cephalexin] Hives   Penicillins Other (See Comments)    Results thru an allergy skin test Has patient had a PCN reaction causing immediate rash, facial/tongue/throat swelling, SOB or lightheadedness with hypotension: No Has patient had a PCN reaction causing severe rash involving mucus membranes or skin necrosis: No Has patient had a PCN reaction that required hospitalization No Has patient had a PCN reaction occurring within the last 10 years: No If all of the above answers are "NO", then may proceed with Cephalosporin use.      OBJECTIVE: Vitals:   09/24/23 0521 09/24/23 1224 09/24/23 1945 09/25/23 0503  BP: (!) 140/66 132/63 (!) 142/69 (!) 109/50  Pulse: 69 71 79 60  Resp: 18 18 15 15   Temp: 99.9 F (37.7 C) 98.5 F (36.9 C) (!) 100.5 F (38.1 C) 98.7 F (37.1 C)  TempSrc:  Oral Oral Oral  SpO2: 98% 100% 100% 99%  Weight:      Height:       Body mass index is 24.37 kg/m.  Physical Exam Constitutional:      Appearance: Normal appearance.  HENT:     Head: Normocephalic and atraumatic.     Right Ear: Tympanic membrane normal.     Left Ear: Tympanic membrane normal.     Nose: Nose normal.     Mouth/Throat:     Mouth: Mucous membranes are moist.  Eyes:     Extraocular  Movements: Extraocular movements intact.     Conjunctiva/sclera: Conjunctivae normal.     Pupils: Pupils are equal, round, and reactive to light.  Cardiovascular:     Rate and Rhythm: Normal rate and regular rhythm.     Heart sounds: No murmur heard.    No friction rub. No gallop.  Pulmonary:     Effort: Pulmonary effort is normal.     Breath sounds: Normal breath sounds.  Abdominal:     General: Abdomen is flat.     Palpations: Abdomen is soft.  Musculoskeletal:        General: Normal range of motion.  Skin:    General: Skin is warm and dry.  Neurological:     General: No focal deficit present.     Mental Status: She is alert and oriented to person, place, and time.  Psychiatric:        Mood and Affect: Mood normal.       Lab Results Lab Results  Component Value Date   WBC 6.4 09/24/2023   HGB 7.5 (L) 09/24/2023   HCT 25.2 (L) 09/24/2023   MCV 82.1 09/24/2023   PLT 383 09/24/2023    Lab Results  Component Value Date   CREATININE 0.58 09/24/2023   BUN 13 09/24/2023   NA 137 09/24/2023   K 4.2 09/24/2023   CL 102 09/24/2023   CO2 26 09/24/2023    Lab Results  Component Value Date   ALT 34 09/24/2023   AST 53 (H) 09/24/2023   ALKPHOS 160 (H) 09/24/2023   BILITOT 0.6 09/24/2023        Danelle Earthly, MD [facility] 09/25/2023, 12:30 PM   I have personally spent 52 minutes involved in face-to-face and non-face-to-face activities for this patient on the day of the visit. Professional time spent includes the following activities: Preparing to see the patient (review of tests), Obtaining and/or reviewing separately obtained history (admission/discharge record), Performing a medically appropriate examination and/or evaluation , Ordering medications/tests/procedures, referring and communicating with other health care professionals, Documenting clinical information in the EMR, Independently interpreting results (not  separately reported), Communicating results to the patient/family/caregiver, Counseling and educating the patient/family/caregiver and Care coordination (not separately reported).

## 2023-09-25 NOTE — Progress Notes (Signed)
PROGRESS NOTE    Laurie [name]  ZOX:[id number] DOB: 1996-03-18 DOA: 09/14/2023 PCP: Pcp, No   Brief Narrative:  27 y.o. F with hx IVDA and polysubstance abuse, hx familial factor V Leiden, smoking, PTSD and chronic hep C who presented with malaise, chest discomfort, draining arm wounds and right knee swelling. Found to have group A strep bacteremia, pulmonary nodules, right arm DVT, right knee septic arthritis.  Details below.  Assessment & Plan:   Principal Problem:   Group A streptococcal bacteremia Active Problems:   Recurrent fever   Septic arthritis of knee, right (HCC)   Multiple lung nodules-secondary to presumed septic emboli   Acute deep vein thrombosis (DVT) of axillary vein of right upper extremity (HCC)   IVDU (intravenous drug user), polysubstance abuse and withdrawal   Hepatitis C   Microcytic anemia   Thyroid nodule  Group A streptococcal bacteremia Possible endocarditis TEE negative but constellation of streptococcal bacteremia, right knee septic arthritis and lung nodules implies endocarditis.  ID recommends treating as such with 2 weeks IV antibiotics then 4 more weeks oral antibiotics. - Continue cefazolin (plan IV cefazolin until Dec 10 then oral therapy with EOT Jan 7)    Recurrent fever Spiked fever to 102 last 3 days with last spike of 100.8 at 8:30 AM on 09/22/2023.  Afebrile ever since until 8 PM 09/24/2023 when she was noted to have low-grade fever of 100.5, Could be an error.  Patient denies any respiratory symptoms, however ID is obtaining chest x-ray.  Repeat blood culture negative from 09/21/2023.   Septic arthritis of knee, right (HCC) S/p irrigation and debridement right knee by Dr. Blanchie Dessert 11/25 Aspirated by Dr. Madilyn Hook in the ER 11/25.  Aspirate with 63K WBCs, gram stain positive but culture negative.  Taken to OR for washout Drain now removed. - WBAT - ROM as tolerated - PT - Leave tegaderms in place for 2 weeks or unless removed by Orthopedic  surgery; sutures to be removed at 2-3 weeks (I.e. Dec 9-16)   Multiple lung nodules-secondary to presumed septic emboli - Repeat imaging as an outpatient per ID   Acute deep vein thrombosis (DVT) of axillary vein of right upper extremity (HCC) New this admission.  - Continue Xarelto for 3-6 months   IVDU (intravenous drug user), polysubstance abuse and withdrawal - Continue Suboxone - Consulted TOC   Thyroid nodule Incidental finding, TSH normal - Outpatient thyroid US recommended   Microcytic anemia Hgb stable in the 7s - Continue ferrous sulfate   Hepatitis C Mild transaminitis US liver unremarkable - Follow up with ID for treatment  DVT prophylaxis: Place TED hose Start: 09/15/23 0531   Code Status: Full Code  Family Communication:  None present at bedside.  Plan of care discussed with patient in length and he/she verbalized understanding and agreed with it.  Status is: Inpatient Remains inpatient appropriate because: History of IVDU, needs to be in the hospital till 09/30/2023 for IV antibiotics.   Estimated body mass index is 24.37 kg/m as calculated from the following:   Height as of this encounter: 5\' 3"  (1.6 m).   Weight as of this encounter: 62.4 kg.    Nutritional Assessment: Body mass index is 24.37 kg/m.Marland Kitchen Seen by dietician.  I agree with the assessment and plan as outlined below: Nutrition Status:        . Skin Assessment: I have examined the patient's skin and I agree with the wound assessment as performed by the wound care RN as  outlined below:    Consultants:  Orthopedics and ID  Procedures:  As above  Antimicrobials:  Anti-infectives (From admission, onward)    Start     Dose/Rate Route Frequency Ordered Stop   09/24/23 2345  metroNIDAZOLE (FLAGYL) tablet 500 mg        500 mg Oral Every 12 hours 09/24/23 2333 09/28/23 0959   09/17/23 1000  linezolid (ZYVOX) tablet 600 mg        600 mg Oral Every 12 hours 09/17/23 0814 09/18/23 2224    09/16/23 1400  ceFAZolin (ANCEF) IVPB 2g/100 mL premix        2 g 200 mL/hr over 30 Minutes Intravenous Every 8 hours 09/15/23 1850     09/16/23 0600  levofloxacin (LEVAQUIN) IVPB 500 mg        500 mg 100 mL/hr over 60 Minutes Intravenous On call to O.R. 09/15/23 1428 09/15/23 1553   09/16/23 0600  vancomycin (VANCOCIN) IVPB 1000 mg/200 mL premix  Status:  Discontinued        1,000 mg 200 mL/hr over 60 Minutes Intravenous On call to O.R. 09/15/23 1428 09/15/23 1431   09/15/23 1945  linezolid (ZYVOX) IVPB 600 mg  Status:  Discontinued        600 mg 300 mL/hr over 60 Minutes Intravenous Every 12 hours 09/15/23 1850 09/17/23 0814   09/15/23 1800  vancomycin (VANCOREADY) IVPB 750 mg/150 mL  Status:  Discontinued        750 mg 150 mL/hr over 60 Minutes Intravenous Every 12 hours 09/15/23 0530 09/15/23 1850   09/15/23 1617  vancomycin (VANCOCIN) powder  Status:  Discontinued          As needed 09/15/23 1617 09/15/23 1806   09/15/23 1600  cefTRIAXone (ROCEPHIN) 2 g in sodium chloride 0.9 % 100 mL IVPB  Status:  Discontinued        2 g 200 mL/hr over 30 Minutes Intravenous Every 24 hours 09/15/23 1048 09/15/23 1850   09/15/23 1456  vancomycin (VANCOCIN) 1-5 GM/200ML-% IVPB       Note to Pharmacy: Minor, Anneita S: cabinet override      09/15/23 1456 09/15/23 1515   09/15/23 0900  meropenem (MERREM) 1 g in sodium chloride 0.9 % 100 mL IVPB  Status:  Discontinued        1 g 200 mL/hr over 30 Minutes Intravenous Every 8 hours 09/15/23 0534 09/15/23 1004   09/15/23 0530  vancomycin (VANCOCIN) IVPB 1000 mg/200 mL premix        1,000 mg 200 mL/hr over 60 Minutes Intravenous  Once 09/15/23 0528 09/15/23 0849   09/15/23 0045  meropenem (MERREM) 1 g in sodium chloride 0.9 % 100 mL IVPB        1 g 200 mL/hr over 30 Minutes Intravenous  Once 09/15/23 0032 09/15/23 0149         Subjective: Patient seen and examined.  She has no complaints.  Objective: Vitals:   09/24/23 1224 09/24/23 1945  09/25/23 0503 09/25/23 1400  BP: 132/63 (!) 142/69 (!) 109/50 123/63  Pulse: 71 79 60 68  Resp: 18 15 15 16   Temp: 98.5 F (36.9 C) (!) 100.5 F (38.1 C) 98.7 F (37.1 C) 99.1 F (37.3 C)  TempSrc: Oral Oral Oral Oral  SpO2: 100% 100% 99% 100%  Weight:      Height:        Intake/Output Summary (Last 24 hours) at 09/25/2023 1603 Last data filed at 09/24/2023 1940 Gross per 24  hour  Intake 120 ml  Output --  Net 120 ml   Filed Weights   09/17/23 0535 09/20/23 1900  Weight: 62.4 kg 62.4 kg    Examination:  General exam: Appears calm and comfortable  Respiratory system: Clear to auscultation. Respiratory effort normal. Cardiovascular system: S1 & S2 heard, RRR. No JVD, murmurs, rubs, gallops or clicks. No pedal edema. Gastrointestinal system: Abdomen is nondistended, soft and nontender. No organomegaly or masses felt. Normal bowel sounds heard. Central nervous system: Alert and oriented. No focal neurological deficits. Extremities: Symmetric 5 x 5 power. Skin: No rashes, lesions or ulcers.   Data Reviewed: I have personally reviewed following labs and imaging studies  CBC: Recent Labs  Lab 09/19/23 1929 09/22/23 1059 09/24/23 1215  WBC 8.1 9.2 6.4  HGB 11.3* 7.6* 7.5*  HCT 35.1* 24.6* 25.2*  MCV 75.5* 78.6* 82.1  PLT 260 397 383   Basic Metabolic Panel: Recent Labs  Lab 09/19/23 1929 09/24/23 1215  NA 136 137  K 4.6 4.2  CL 102 102  CO2 25 26  GLUCOSE 74 97  BUN 21* 13  CREATININE 0.78 0.58  CALCIUM 7.9* 8.5*   GFR: Estimated Creatinine Clearance: 86.6 mL/min (by C-G formula based on SCr of 0.58 mg/dL). Liver Function Tests: Recent Labs  Lab 09/19/23 1929 09/24/23 1215  AST 36 53*  ALT 77* 34  ALKPHOS 116 160*  BILITOT 1.1 0.6  PROT 6.5 7.8  ALBUMIN 2.0* 2.5*   No results for input(s): "LIPASE", "AMYLASE" in the last 168 hours. No results for input(s): "AMMONIA" in the last 168 hours. Coagulation Profile: No results for input(s): "INR",  "PROTIME" in the last 168 hours. Cardiac Enzymes: No results for input(s): "CKTOTAL", "CKMB", "CKMBINDEX", "TROPONINI" in the last 168 hours. BNP (last 3 results) No results for input(s): "PROBNP" in the last 8760 hours. HbA1C: No results for input(s): "HGBA1C" in the last 72 hours. CBG: No results for input(s): "GLUCAP" in the last 168 hours. Lipid Profile: No results for input(s): "CHOL", "HDL", "LDLCALC", "TRIG", "CHOLHDL", "LDLDIRECT" in the last 72 hours. Thyroid Function Tests: No results for input(s): "TSH", "T4TOTAL", "FREET4", "T3FREE", "THYROIDAB" in the last 72 hours. Anemia Panel: No results for input(s): "VITAMINB12", "FOLATE", "FERRITIN", "TIBC", "IRON", "RETICCTPCT" in the last 72 hours. Sepsis Labs: No results for input(s): "PROCALCITON", "LATICACIDVEN" in the last 168 hours.  Recent Results (from the past 240 hour(s))  Aerobic/Anaerobic Culture w Gram Stain (surgical/deep wound)     Status: None   Collection Time: 09/15/23  4:12 PM   Specimen: PATH Cytology Misc. fluid; Body Fluid  Result Value Ref Range Status   Specimen Description   Final    KNEE Performed at [facility], [address] [place], [place] [address]    Special Requests   Final    RIGHT Performed at [facility], [address] [place], [place] [address]    Gram Stain   Final    FEW WBC PRESENT,BOTH PMN AND MONONUCLEAR NO ORGANISMS SEEN    Culture   Final    No growth aerobically or anaerobically. Performed at [facility], [address], [place], [place] [address]    Report Status 09/21/2023 FINAL  Final  Culture, blood (Routine X 2) w Reflex to ID Panel     Status: None   Collection Time: 09/17/23  1:24 PM   Specimen: BLOOD LEFT HAND  Result Value Ref Range Status   Specimen Description   Final  BLOOD LEFT HAND Performed at [facility], [address], [place], [place] [address]    Special Requests   Final    BOTTLES DRAWN  AEROBIC ONLY Blood Culture results may not be optimal due to an inadequate volume of blood received in culture bottles Performed at [facility], [address], [place], [place] [address]    Culture   Final    NO GROWTH 5 DAYS Performed at [facility], [address], [place], [place] [address]    Report Status 09/22/2023 FINAL  Final  Culture, blood (Routine X 2) w Reflex to ID Panel     Status: None   Collection Time: 09/17/23  1:26 PM   Specimen: BLOOD LEFT HAND  Result Value Ref Range Status   Specimen Description   Final    BLOOD LEFT HAND Performed at [facility], [address], [place], [place] [address]    Special Requests   Final    BOTTLES DRAWN AEROBIC ONLY Blood Culture results may not be optimal due to an inadequate volume of blood received in culture bottles Performed at [facility], [address] [place], [place] [address]    Culture   Final    NO GROWTH 5 DAYS Performed at [facility], [address], [place], [place] [address]    Report Status 09/22/2023 FINAL  Final  Culture, blood (Routine X 2) w Reflex to ID Panel     Status: None (Preliminary result)   Collection Time: 09/21/23  5:51 AM   Specimen: BLOOD LEFT HAND  Result Value Ref Range Status   Specimen Description   Final    BLOOD LEFT HAND Performed at [facility], [address], [place], [place] [address]    Special Requests   Final    BOTTLES DRAWN AEROBIC AND ANAEROBIC Blood Culture results may not be optimal due to an inadequate volume of blood received in culture bottles Performed at [facility], [address] [place], [place] [address]    Culture   Final    NO GROWTH 4 DAYS Performed at [facility], [address], [place], [place] [address]    Report Status PENDING  Incomplete  Culture, blood (Routine X 2) w Reflex to ID Panel     Status: None (Preliminary result)   Collection Time: 09/21/23   5:54 AM   Specimen: BLOOD LEFT ARM  Result Value Ref Range Status   Specimen Description BLOOD LEFT ARM  Final   Special Requests   Final    BOTTLES DRAWN AEROBIC AND ANAEROBIC Blood Culture results may not be optimal due to an inadequate volume of blood received in culture bottles   Culture   Final    NO GROWTH 4 DAYS Performed at [facility], [address], [place], [place] [address]    Report Status PENDING  Incomplete  Wet prep, genital     Status: Abnormal   Collection Time: 09/24/23 10:13 PM   Specimen: Vaginal  Result Value Ref Range Status   Yeast Wet Prep HPF POC NONE SEEN NONE SEEN Final   Trich, Wet Prep PRESENT (A) NONE SEEN Final   Clue Cells Wet Prep HPF POC NONE SEEN NONE SEEN Final   WBC, Wet Prep HPF POC <10 <10 Final   Sperm NONE SEEN  Final    Comment: Performed at [facility], [address]  [place], [place] [address]     Radiology Studies: No results found.  Scheduled Meds:  buprenorphine-naloxone  1 tablet Sublingual TID   clotrimazole  1 Applicatorful Vaginal QHS   ferrous sulfate  325 mg Oral Q breakfast   metroNIDAZOLE  500 mg Oral Q12H   nicotine  21 mg Transdermal Daily   mouth rinse  15 mL Mouth Rinse 4 times per day   oxyCODONE  5 mg Oral Once   Rivaroxaban  15 mg Oral BID WC   Followed by   Melene Muller ON 10/08/2023] rivaroxaban  20 mg Oral Q supper   sodium chloride flush  3 mL Intravenous Q12H   Continuous Infusions:   ceFAZolin (ANCEF) IV 2 g (09/25/23 1344)     LOS: 10 days   Hughie Closs, MD [facility]  09/25/2023, 4:03 PM   *Please note that this is a verbal dictation therefore any spelling or grammatical errors are due to the "Dragon [employer] One" system interpretation.  Please page via Amion and do not message via secure chat for urgent patient care matters. Secure chat can be used for non urgent patient care matters.  How to contact the [facility] Attending or Consulting provider 7A - 7P or covering  provider during after hours 7P -7A, for this patient?  Check the care team in [facility] and look for a) attending/consulting TR[facility] provider listed and b) the [facility] team listed. Page or secure chat 7A-7P. Log into [email or web address] and use [place]'s universal password to access. If you do not have the password, please contact the hospital operator. Locate the [facility] provider you are looking for under [facility] and page to a number that you can be directly reached. If you still have difficulty reaching the provider, please page the [facility] (Director on Call) for the Hospitalists listed on amion for assistance.

## 2023-09-26 DIAGNOSIS — B955 Unspecified streptococcus as the cause of diseases classified elsewhere: Secondary | ICD-10-CM | POA: Diagnosis not present

## 2023-09-26 DIAGNOSIS — R7881 Bacteremia: Secondary | ICD-10-CM | POA: Diagnosis not present

## 2023-09-26 LAB — CBC WITH DIFFERENTIAL/PLATELET
Abs Immature Granulocytes: 0.01 10*3/uL (ref 0.00–0.07)
Basophils Absolute: 0.1 10*3/uL (ref 0.0–0.1)
Basophils Relative: 2 %
Eosinophils Absolute: 0.2 10*3/uL (ref 0.0–0.5)
Eosinophils Relative: 4 %
HCT: 24.2 % — ABNORMAL LOW (ref 36.0–46.0)
Hemoglobin: 7.2 g/dL — ABNORMAL LOW (ref 12.0–15.0)
Immature Granulocytes: 0 %
Lymphocytes Relative: 48 %
Lymphs Abs: 2.2 10*3/uL (ref 0.7–4.0)
MCH: 24.2 pg — ABNORMAL LOW (ref 26.0–34.0)
MCHC: 29.8 g/dL — ABNORMAL LOW (ref 30.0–36.0)
MCV: 81.5 fL (ref 80.0–100.0)
Monocytes Absolute: 0.5 10*3/uL (ref 0.1–1.0)
Monocytes Relative: 11 %
Neutro Abs: 1.6 10*3/uL — ABNORMAL LOW (ref 1.7–7.7)
Neutrophils Relative %: 35 %
Platelets: 396 10*3/uL (ref 150–400)
RBC: 2.97 MIL/uL — ABNORMAL LOW (ref 3.87–5.11)
RDW: 19.6 % — ABNORMAL HIGH (ref 11.5–15.5)
WBC: 4.6 10*3/uL (ref 4.0–10.5)
nRBC: 0 % (ref 0.0–0.2)

## 2023-09-26 LAB — CULTURE, BLOOD (ROUTINE X 2)
Culture: NO GROWTH
Culture: NO GROWTH

## 2023-09-26 NOTE — Plan of Care (Signed)

## 2023-09-26 NOTE — Progress Notes (Signed)
Physical Therapy Treatment Patient Details Name: Laurie [name] MRN: [id number] DOB: July 06, 1996 Today's Date: 09/26/2023   History of Present Illness 27 y.o. F with hx IVDA and polysubstance abuse, hx familial factor V Leiden, smoking, PTSD and chronic hep C who presented with malaise, chest discomfort, draining arm wounds and right knee swelling.   Found to have group A strep bacteremia, pulmonary nodules, right arm DVT, right knee septic arthritis.    PT Comments  Pt with gradual improvement.  She did receive pain meds prior to therapy and ambulated 60'.  Pt in pain with exercises but tolerated and tried to increase ROM as able.  Still with limited knee flexion and extension.  She has been transferring on her own in room with RW.  Encouraged to perform flexion and extension ROM/stretches as able over weekend.  Pt demonstrating exercises and aware of low load long duration stretch for ext/  Cont POC.  Recommend outpt PT    If plan is discharge home, recommend the following: A little help with walking and/or transfers;Assistance with cooking/housework;Assist for transportation;Help with stairs or ramp for entrance;A little help with bathing/dressing/bathroom   Can travel by private vehicle        Equipment Recommendations  Rolling walker (2 wheels)    Recommendations for Other Services       Precautions / Restrictions Precautions Precautions: Fall Restrictions RLE Weight Bearing: Weight bearing as tolerated     Mobility  Bed Mobility Overal bed mobility: Modified Independent Bed Mobility: Supine to Sit, Sit to Supine     Supine to sit: Modified independent (Device/Increase time) Sit to supine: Modified independent (Device/Increase time)        Transfers Overall transfer level: Needs assistance Equipment used: Rolling walker (2 wheels) Transfers: Sit to/from Stand, Bed to chair/wheelchair/BSC Sit to Stand: Supervision Stand pivot transfers: Supervision         General  transfer comment: Has been transferring to [facility] indpeendently.  Demonstrated safely but with decreased weight R LE    Ambulation/Gait Ambulation/Gait assistance: Contact guard assist Gait Distance (Feet): 60 Feet Assistive device: Rolling walker (2 wheels) Gait Pattern/deviations: Step-to pattern, Decreased stride length, Decreased dorsiflexion - right, Knee flexed in stance - right, Antalgic, Decreased weight shift to right Gait velocity: decreased     General Gait Details: Pt with knee flexed and ambulating on toe with little weight shift to R.  Encouraged to try foot flat and knee straight but too painful.  Ambualted 36' but became hot in hallway and returned to room.  Pt is steady wtih RW just puts less weight on R.  Focused on exercises   Stairs             Wheelchair Mobility     Tilt Bed    Modified Rankin (Stroke Patients Only)       Balance Overall balance assessment: Needs assistance Sitting-balance support: No upper extremity supported Sitting balance-Leahy Scale: Normal     Standing balance support: Bilateral upper extremity supported, Single extremity supported Standing balance-Leahy Scale: Fair Standing balance comment: Can stand and pivot to bsc using bed rail; RW to ambulate; also mainly balancing on L LE (limited use of R)                            Cognition Arousal: Alert Behavior During Therapy: WFL for tasks assessed/performed Overall Cognitive Status: Within Functional Limits for tasks assessed  Exercises Total Joint Exercises Quad Sets: AROM, Right, 20 reps, Seated (10x2) Long Arc Quad: AROM, Right, Seated (10x2) Long Arc Quad Limitations: unable to achieve end range Knee Flexion: AAROM, Right, Seated (5x2) Knee Flexion Limitations: Performed with bed elevated to reduce friction, AAROM to stretch, 10 sec hold; educated on Geoffroy AAROM with L LE Other Exercises Other  Exercises: educated to rest with R knee straight and folded pillow under ankle  (low load long duration stretch( Other Exercises: Knee ext stretch at EOB - 3 x 30 sec with quad sets.  Also, had pt provide manual pressure as tolerated.  Progressing to lacking ~15 degrees    General Comments General comments (skin integrity, edema, etc.): VSS      Pertinent Vitals/Pain Pain Assessment Pain Assessment: 0-10 Pain Score: 9  Pain Location: right knee Pain Descriptors / Indicators: Discomfort, Burning Pain Intervention(s): Limited activity within patient's tolerance, Monitored during session, Premedicated before session, Repositioned    Home Living                          Prior Function            PT Goals (current goals can now be found in the care plan section) Progress towards PT goals: Progressing toward goals    Frequency    Min 1X/week      PT Plan      Co-evaluation              AM-PAC PT "6 Clicks" Mobility   Outcome Measure  Help needed turning from your back to your side while in a flat bed without using bedrails?: None Help needed moving from lying on your back to sitting on the side of a flat bed without using bedrails?: None Help needed moving to and from a bed to a chair (including a wheelchair)?: None Help needed standing up from a chair using your arms (e.g., wheelchair or bedside chair)?: None Help needed to walk in hospital room?: A Little Help needed climbing 3-5 steps with a railing? : A Lot 6 Click Score: 21    End of Session Equipment Utilized During Treatment: Gait belt Activity Tolerance: Patient limited by pain Patient left: in bed;with call bell/phone within reach Nurse Communication: Mobility status PT Visit Diagnosis: Unsteadiness on feet (R26.81);Pain;Difficulty in walking, not elsewhere classified (R26.2) Pain - Right/Left: Right Pain - part of body: Knee     Time: 9147-8295 PT Time Calculation (min) (ACUTE ONLY): 18  min  Charges:    $Therapeutic Exercise: 8-22 mins PT General Charges $$ ACUTE PT VISIT: 1 Visit                     Anise Salvo, PT Acute Rehab [facility] [phone]    Rayetta Humphrey 09/26/2023, 5:49 PM

## 2023-09-26 NOTE — Progress Notes (Signed)
Pt has been non compliant with most care today,   pt refusing lab draws and personal care    md aware  safety measures in place   bed locked in low position  call bell within reach

## 2023-09-26 NOTE — Progress Notes (Signed)
ID Brief note: #Group A strep bacteremia/SSTI/right knee septic arthritis concern for endocarditis given pulmonary nodule #Fever 2/2 likely aspiration al events #Polysubstance abuse -Chest x-ray on 12/5 showed no acute cardiopulmonary disease. - Patient has remained afebrile since 12/4.  I suspect fevers secondary to aspirational events while eating in bed. -Antibiotic plans as previously discussed. - Dr. Drue Second will be covering this weekend.

## 2023-09-26 NOTE — Progress Notes (Signed)
PROGRESS NOTE    Laurie [name]  AOZ:[id number] DOB: [birth year] DOA: 09/14/2023 PCP: Pcp, No   Brief Narrative:  27 y.o. F with hx IVDA and polysubstance abuse, hx familial factor V Leiden, smoking, PTSD and chronic hep C who presented with malaise, chest discomfort, draining arm wounds and right knee swelling. Found to have group A strep bacteremia, pulmonary nodules, right arm DVT, right knee septic arthritis.  Details below.  Assessment & Plan:   Principal Problem:   Group A streptococcal bacteremia Active Problems:   Recurrent fever   Septic arthritis of knee, right (HCC)   Multiple lung nodules-secondary to presumed septic emboli   Acute deep vein thrombosis (DVT) of axillary vein of right upper extremity (HCC)   IVDU (intravenous drug user), polysubstance abuse and withdrawal   Hepatitis C   Microcytic anemia   Thyroid nodule  Group A streptococcal bacteremia Possible endocarditis TEE negative but constellation of streptococcal bacteremia, right knee septic arthritis and lung nodules implies endocarditis.  ID recommends treating as such with 2 weeks IV antibiotics then 4 more weeks oral antibiotics. - Continue cefazolin (plan IV cefazolin until Dec 10 then oral therapy with EOT Jan 7)    Recurrent fever Spiked fever to 102 last 3 days with last spike of 100.8 at 8:30 AM on 09/22/2023.  Afebrile ever since until 8 PM 09/24/2023 when she was noted to have low-grade fever of 100.5, Could be an error.  Patient denies any respiratory symptoms, however ID is obtaining chest x-ray.  Repeat blood culture negative from 09/21/2023.   Septic arthritis of knee, right (HCC) S/p irrigation and debridement right knee by Dr. Blanchie Dessert 11/25 Aspirated by Dr. Madilyn Hook in the ER 11/25.  Aspirate with 63K WBCs, gram stain positive but culture negative.  Taken to OR for washout Drain now removed. - WBAT - ROM as tolerated - PT - Leave tegaderms in place for 2 weeks or unless removed by Orthopedic  surgery; sutures to be removed at 2-3 weeks (I.e. Dec 9-16)   Multiple lung nodules-secondary to presumed septic emboli - Repeat imaging as an outpatient per ID   Acute deep vein thrombosis (DVT) of axillary vein of right upper extremity (HCC) New this admission.  - Continue Xarelto for 3-6 months   IVDU (intravenous drug user), polysubstance abuse and withdrawal - Continue Suboxone - Consulted TOC   Thyroid nodule Incidental finding, TSH normal - Outpatient thyroid US recommended   Microcytic anemia Hgb stable in the 7s - Continue ferrous sulfate   Hepatitis C Mild transaminitis US liver unremarkable - Follow up with ID for treatment  DVT prophylaxis: Place TED hose Start: 09/15/23 0531   Code Status: Full Code  Family Communication:  None present at bedside.  Plan of care discussed with patient in length and he/she verbalized understanding and agreed with it.  Status is: Inpatient Remains inpatient appropriate because: History of IVDU, needs to be in the hospital till 09/30/2023 for IV antibiotics.   Estimated body mass index is 24.37 kg/m as calculated from the following:   Height as of this encounter: 5\' 3"  (1.6 m).   Weight as of this encounter: 62.4 kg.    Nutritional Assessment: Body mass index is 24.37 kg/m.Marland Kitchen Seen by dietician.  I agree with the assessment and plan as outlined below: Nutrition Status:        . Skin Assessment: I have examined the patient's skin and I agree with the wound assessment as performed by the wound care RN as  outlined below:    Consultants:  Orthopedics and ID  Procedures:  As above  Antimicrobials:  Anti-infectives (From admission, onward)    Start     Dose/Rate Route Frequency Ordered Stop   09/24/23 2345  metroNIDAZOLE (FLAGYL) tablet 500 mg        500 mg Oral Every 12 hours 09/24/23 2333 09/28/23 0959   09/17/23 1000  linezolid (ZYVOX) tablet 600 mg        600 mg Oral Every 12 hours 09/17/23 0814 09/18/23 2224    09/16/23 1400  ceFAZolin (ANCEF) IVPB 2g/100 mL premix        2 g 200 mL/hr over 30 Minutes Intravenous Every 8 hours 09/15/23 1850     09/16/23 0600  levofloxacin (LEVAQUIN) IVPB 500 mg        500 mg 100 mL/hr over 60 Minutes Intravenous On call to O.R. 09/15/23 1428 09/15/23 1553   09/16/23 0600  vancomycin (VANCOCIN) IVPB 1000 mg/200 mL premix  Status:  Discontinued        1,000 mg 200 mL/hr over 60 Minutes Intravenous On call to O.R. 09/15/23 1428 09/15/23 1431   09/15/23 1945  linezolid (ZYVOX) IVPB 600 mg  Status:  Discontinued        600 mg 300 mL/hr over 60 Minutes Intravenous Every 12 hours 09/15/23 1850 09/17/23 0814   09/15/23 1800  vancomycin (VANCOREADY) IVPB 750 mg/150 mL  Status:  Discontinued        750 mg 150 mL/hr over 60 Minutes Intravenous Every 12 hours 09/15/23 0530 09/15/23 1850   09/15/23 1617  vancomycin (VANCOCIN) powder  Status:  Discontinued          As needed 09/15/23 1617 09/15/23 1806   09/15/23 1600  cefTRIAXone (ROCEPHIN) 2 g in sodium chloride 0.9 % 100 mL IVPB  Status:  Discontinued        2 g 200 mL/hr over 30 Minutes Intravenous Every 24 hours 09/15/23 1048 09/15/23 1850   09/15/23 1456  vancomycin (VANCOCIN) 1-5 GM/200ML-% IVPB       Note to Pharmacy: Minor, Anneita S: cabinet override      09/15/23 1456 09/15/23 1515   09/15/23 0900  meropenem (MERREM) 1 g in sodium chloride 0.9 % 100 mL IVPB  Status:  Discontinued        1 g 200 mL/hr over 30 Minutes Intravenous Every 8 hours 09/15/23 0534 09/15/23 1004   09/15/23 0530  vancomycin (VANCOCIN) IVPB 1000 mg/200 mL premix        1,000 mg 200 mL/hr over 60 Minutes Intravenous  Once 09/15/23 0528 09/15/23 0849   09/15/23 0045  meropenem (MERREM) 1 g in sodium chloride 0.9 % 100 mL IVPB        1 g 200 mL/hr over 30 Minutes Intravenous  Once 09/15/23 0032 09/15/23 0149         Subjective: Patient seen and examined.  She has no complaints.  Objective: Vitals:   09/25/23 0503 09/25/23 1400  09/25/23 2013 09/26/23 0432  BP: (!) 109/50 123/63 (!) 118/53 (!) 134/59  Pulse: 60 68 66 (!) 58  Resp: 15 16 18 18   Temp: 98.7 F (37.1 C) 99.1 F (37.3 C) 99.2 F (37.3 C) 99 F (37.2 C)  TempSrc: Oral Oral Oral Oral  SpO2: 99% 100% 100% 99%  Weight:      Height:        Intake/Output Summary (Last 24 hours) at 09/26/2023 1325 Last data filed at 09/26/2023 0151 Gross per  24 hour  Intake 1460 ml  Output --  Net 1460 ml   Filed Weights   09/17/23 0535 09/20/23 1900  Weight: 62.4 kg 62.4 kg    Examination:  General exam: Appears calm and comfortable  Respiratory system: Clear to auscultation. Respiratory effort normal. Cardiovascular system: S1 & S2 heard, RRR. No JVD, murmurs, rubs, gallops or clicks. No pedal edema. Gastrointestinal system: Abdomen is nondistended, soft and nontender. No organomegaly or masses felt. Normal bowel sounds heard. Central nervous system: Alert and oriented. No focal neurological deficits. Extremities: Symmetric 5 x 5 power. Skin: No rashes, lesions or ulcers.   Data Reviewed: I have personally reviewed following labs and imaging studies  CBC: Recent Labs  Lab 09/19/23 1929 09/22/23 1059 09/24/23 1215  WBC 8.1 9.2 6.4  HGB 11.3* 7.6* 7.5*  HCT 35.1* 24.6* 25.2*  MCV 75.5* 78.6* 82.1  PLT 260 397 383   Basic Metabolic Panel: Recent Labs  Lab 09/19/23 1929 09/24/23 1215  NA 136 137  K 4.6 4.2  CL 102 102  CO2 25 26  GLUCOSE 74 97  BUN 21* 13  CREATININE 0.78 0.58  CALCIUM 7.9* 8.5*   GFR: Estimated Creatinine Clearance: 86.6 mL/min (by C-G formula based on SCr of 0.58 mg/dL). Liver Function Tests: Recent Labs  Lab 09/19/23 1929 09/24/23 1215  AST 36 53*  ALT 77* 34  ALKPHOS 116 160*  BILITOT 1.1 0.6  PROT 6.5 7.8  ALBUMIN 2.0* 2.5*   No results for input(s): "LIPASE", "AMYLASE" in the last 168 hours. No results for input(s): "AMMONIA" in the last 168 hours. Coagulation Profile: No results for input(s): "INR",  "PROTIME" in the last 168 hours. Cardiac Enzymes: No results for input(s): "CKTOTAL", "CKMB", "CKMBINDEX", "TROPONINI" in the last 168 hours. BNP (last 3 results) No results for input(s): "PROBNP" in the last 8760 hours. HbA1C: No results for input(s): "HGBA1C" in the last 72 hours. CBG: No results for input(s): "GLUCAP" in the last 168 hours. Lipid Profile: No results for input(s): "CHOL", "HDL", "LDLCALC", "TRIG", "CHOLHDL", "LDLDIRECT" in the last 72 hours. Thyroid Function Tests: No results for input(s): "TSH", "T4TOTAL", "FREET4", "T3FREE", "THYROIDAB" in the last 72 hours. Anemia Panel: No results for input(s): "VITAMINB12", "FOLATE", "FERRITIN", "TIBC", "IRON", "RETICCTPCT" in the last 72 hours. Sepsis Labs: No results for input(s): "PROCALCITON", "LATICACIDVEN" in the last 168 hours.  Recent Results (from the past 240 hour(s))  Culture, blood (Routine X 2) w Reflex to ID Panel     Status: None   Collection Time: 09/17/23  1:24 PM   Specimen: BLOOD LEFT HAND  Result Value Ref Range Status   Specimen Description   Final    BLOOD LEFT HAND Performed at [facility], [address], [place], [place] [address]    Special Requests   Final    BOTTLES DRAWN AEROBIC ONLY Blood Culture results may not be optimal due to an inadequate volume of blood received in culture bottles Performed at [facility], [address] [place], [place] [address]    Culture   Final    NO GROWTH 5 DAYS Performed at [facility], [address], [place], [place] [address]    Report Status 09/22/2023 FINAL  Final  Culture, blood (Routine X 2) w Reflex to ID Panel     Status: None   Collection Time: 09/17/23  1:26 PM   Specimen: BLOOD LEFT HAND  Result Value Ref Range Status   Specimen Description  Final    BLOOD LEFT HAND Performed at [facility], [address] [place], [place] [address]    Special Requests   Final    BOTTLES DRAWN AEROBIC ONLY Blood  Culture results may not be optimal due to an inadequate volume of blood received in culture bottles Performed at [facility], [address] [place], [place] [address]    Culture   Final    NO GROWTH 5 DAYS Performed at [facility], [address] [place], [place] [address]    Report Status 09/22/2023 FINAL  Final  Culture, blood (Routine X 2) w Reflex to ID Panel     Status: None   Collection Time: 09/21/23  5:51 AM   Specimen: BLOOD LEFT HAND  Result Value Ref Range Status   Specimen Description   Final    BLOOD LEFT HAND Performed at [facility], [address] [place], [place] [address]    Special Requests   Final    BOTTLES DRAWN AEROBIC AND ANAEROBIC Blood Culture results may not be optimal due to an inadequate volume of blood received in culture bottles Performed at [facility], [address] [place], [place] [address]    Culture   Final    NO GROWTH 5 DAYS Performed at [facility], [address] [place], [place] [address]    Report Status 09/26/2023 FINAL  Final  Culture, blood (Routine X 2) w Reflex to ID Panel     Status: None   Collection Time: 09/21/23  5:54 AM   Specimen: BLOOD LEFT ARM  Result Value Ref Range Status   Specimen Description BLOOD LEFT ARM  Final   Special Requests   Final    BOTTLES DRAWN AEROBIC AND ANAEROBIC Blood Culture results may not be optimal due to an inadequate volume of blood received in culture bottles   Culture   Final    NO GROWTH 5 DAYS Performed at [facility], [address], [place], [place] [address]    Report Status 09/26/2023 FINAL  Final  Wet prep, genital     Status: Abnormal   Collection Time: 09/24/23 10:13 PM   Specimen: Vaginal  Result Value Ref Range Status   Yeast Wet Prep HPF POC NONE SEEN NONE SEEN Final   Trich, Wet Prep PRESENT (A) NONE SEEN Final   Clue Cells Wet Prep HPF POC NONE SEEN NONE SEEN Final   WBC, Wet Prep HPF POC <10 <10  Final   Sperm NONE SEEN  Final    Comment: Performed at [facility], [address] [place], [place] [address]     Radiology Studies: DG CHEST PORT 1 VIEW  Result Date: 09/25/2023 CLINICAL DATA:  Central chest pain. EXAM: PORTABLE CHEST 1 VIEW COMPARISON:  X-ray 09/20/2023 and older.  CT scan 09/15/2023. FINDINGS: Film is slightly rotated to the right. No consolidation, pneumothorax or effusion. Normal cardiopericardial silhouette without edema. Again as stated previously the lung nodules on CT are not well seen on this x-ray. Please see prior recommendation. IMPRESSION: No acute cardiopulmonary disease.  Rotated radiograph Electronically Signed   By: Karen Kays M.D.   On: 09/25/2023 17:06    Scheduled Meds:  buprenorphine-naloxone  1 tablet Sublingual TID   clotrimazole  1 Applicatorful Vaginal QHS   ferrous sulfate  325 mg Oral Q breakfast   metroNIDAZOLE  500 mg Oral Q12H   nicotine  21 mg  Transdermal Daily   mouth rinse  15 mL Mouth Rinse 4 times per day   oxyCODONE  5 mg Oral Once   Rivaroxaban  15 mg Oral BID WC   Followed by   Melene Muller ON 10/08/2023] rivaroxaban  20 mg Oral Q supper   sodium chloride flush  3 mL Intravenous Q12H   Continuous Infusions:   ceFAZolin (ANCEF) IV 2 g (09/26/23 1321)     LOS: 11 days   Hughie Closs, MD [facility]  09/26/2023, 1:25 PM   *Please note that this is a verbal dictation therefore any spelling or grammatical errors are due to the "Dragon [employer] One" system interpretation.  Please page via Amion and do not message via secure chat for urgent patient care matters. Secure chat can be used for non urgent patient care matters.  How to contact the [facility] Attending or Consulting provider 7A - 7P or covering provider during after hours 7P -7A, for this patient?  Check the care team in [facility] and look for a) attending/consulting [facility] provider listed and b) the [facility] team listed. Page or secure chat 7A-7P. Log into  [email or web address] and use [facility]'s universal password to access. If you do not have the password, please contact the hospital operator. Locate the [facility] provider you are looking for under [facility] and page to a number that you can be directly reached. If you still have difficulty reaching the provider, please page the [facility] (Director on Call) for the Hospitalists listed on amion for assistance.

## 2023-09-26 NOTE — Plan of Care (Signed)
  Problem: Clinical Measurements: Goal: Diagnostic test results will improve Outcome: Progressing   Problem: Clinical Measurements: Goal: Respiratory complications will improve Outcome: Progressing   Problem: Clinical Measurements: Goal: Cardiovascular complication will be avoided Outcome: Progressing   Problem: Activity: Goal: Risk for activity intolerance will decrease Outcome: Progressing   

## 2023-09-26 NOTE — Progress Notes (Signed)
     Subjective: Patient reports knee pain as moderate. Walked some with PT yesterday to nursing station and back. Encouraged ROM exercises and OOB mobility. No new concerns.  Objective:   VITALS:   Vitals:   09/25/23 0503 09/25/23 1400 09/25/23 2013 09/26/23 0432  BP: (!) 109/50 123/63 (!) 118/53 (!) 134/59  Pulse: 60 68 66 (!) 58  Resp: 15 16 18 18   Temp: 98.7 F (37.1 C) 99.1 F (37.3 C) 99.2 F (37.3 C) 99 F (37.2 C)  TempSrc: Oral Oral Oral Oral  SpO2: 99% 100% 100% 99%  Weight:      Height:       Mild expected post op swelling about the knee Sensation intact distally Intact pulses distally Dorsiflexion/Plantar flexion intact Incision: sutures c/d/I, no erythema, no drainage Compartment soft  Lab Results  Component Value Date   WBC 6.4 09/24/2023   HGB 7.5 (L) 09/24/2023   HCT 25.2 (L) 09/24/2023   MCV 82.1 09/24/2023   PLT 383 09/24/2023   BMET    Component Value Date/Time   NA 137 09/24/2023 1215   NA 140 01/09/2016 1538   K 4.2 09/24/2023 1215   CL 102 09/24/2023 1215   CO2 26 09/24/2023 1215   GLUCOSE 97 09/24/2023 1215   BUN 13 09/24/2023 1215   BUN 13 01/09/2016 1538   CREATININE 0.58 09/24/2023 1215   CALCIUM 8.5 (L) 09/24/2023 1215   GFRNONAA >60 09/24/2023 1215    Xray: none  Assessment/Plan: 4 Days Post-Op   Principal Problem:   Group A streptococcal bacteremia Active Problems:   Hepatitis C   Septic arthritis of knee, right (HCC)   Multiple lung nodules-secondary to presumed septic emboli   Acute deep vein thrombosis (DVT) of axillary vein of right upper extremity (HCC)   Microcytic anemia   IVDU (intravenous drug user), polysubstance abuse and withdrawal   Thyroid nodule   Recurrent fever  S/p R knee I&D 11/25  Post op recs: WB: WBAT Abx: Continue antibiotics per infectious disease, currently on flagyl and ancef Imaging: None Dressing: okay to change as needed is saturated and soiled. Compression ace wrap for  comfort. DVT prophylaxis: per primary for DVT treatment Follow up: 2 weeks after surgery for a wound check with Dr. Blanchie Dessert at [facility].  Address: [address], [place], [place] [address]  Office Phone: [phone]     Laurie [name] 09/26/2023, 7:09 AM   Weber Cooks, MD  Contact information:   [phone] 7am-5pm epic message Dr. Blanchie Dessert, or call office for patient follow up: [phone] After hours and holidays please check [email or web address] for group call information for [facility]

## 2023-09-27 DIAGNOSIS — R7881 Bacteremia: Secondary | ICD-10-CM | POA: Diagnosis not present

## 2023-09-27 DIAGNOSIS — B955 Unspecified streptococcus as the cause of diseases classified elsewhere: Secondary | ICD-10-CM | POA: Diagnosis not present

## 2023-09-27 NOTE — Plan of Care (Signed)
  Problem: Education: Goal: Knowledge of General Education information will improve Description: Including pain rating scale, medication(s)/side effects and non-pharmacologic comfort measures Outcome: Progressing   Problem: Health Behavior/Discharge Planning: Goal: Ability to manage health-related needs will improve Outcome: Progressing   Problem: Coping: Goal: Level of anxiety will decrease Outcome: Progressing   Problem: Elimination: Goal: Will not experience complications related to urinary retention Outcome: Progressing   Problem: Safety: Goal: Ability to remain free from injury will improve Outcome: Progressing

## 2023-09-27 NOTE — Plan of Care (Signed)

## 2023-09-27 NOTE — Progress Notes (Signed)
PROGRESS NOTE    Laurie [name]  WUJ:[id number] DOB: November 16, 1995 DOA: 09/14/2023 PCP: Pcp, No   Brief Narrative:  27 y.o. F with hx IVDA and polysubstance abuse, hx familial factor V Leiden, smoking, PTSD and chronic hep C who presented with malaise, chest discomfort, draining arm wounds and right knee swelling. Found to have group A strep bacteremia, pulmonary nodules, right arm DVT, right knee septic arthritis.  Details below.  Assessment & Plan:   Principal Problem:   Group A streptococcal bacteremia Active Problems:   Recurrent fever   Septic arthritis of knee, right (HCC)   Multiple lung nodules-secondary to presumed septic emboli   Acute deep vein thrombosis (DVT) of axillary vein of right upper extremity (HCC)   IVDU (intravenous drug user), polysubstance abuse and withdrawal   Hepatitis C   Microcytic anemia   Thyroid nodule  Group A streptococcal bacteremia Possible endocarditis TEE negative but constellation of streptococcal bacteremia, right knee septic arthritis and lung nodules implies endocarditis.  ID recommends treating as such with 2 weeks IV antibiotics then 4 more weeks oral antibiotics. - Continue cefazolin (plan IV cefazolin until Dec 10 then oral therapy with EOT Jan 7)  Recurrent fever Spiked fever to 102 last 3 days with last spike of 100.8 at 8:30 AM on 09/22/2023.  Afebrile ever since until 8 PM 09/24/2023 when she was noted to have low-grade fever of 100.5, Could be an error.  Patient denies any respiratory symptoms and chest x-ray obtained by ID was unremarkable.  Patient is afebrile again.  Repeat blood culture negative from 09/21/2023.   Septic arthritis of knee, right (HCC) S/p irrigation and debridement right knee by Dr. Blanchie Dessert 11/25 Aspirated by Dr. Madilyn Hook in the ER 11/25.  Aspirate with 63K WBCs, gram stain positive but culture negative.  Taken to OR for washout Drain now removed. - WBAT - ROM as tolerated - PT - Leave tegaderms in place for 2  weeks or unless removed by Orthopedic surgery; sutures to be removed at 2-3 weeks (I.e. Dec 9-16)   Multiple lung nodules-secondary to presumed septic emboli - Repeat imaging as an outpatient per ID   Acute deep vein thrombosis (DVT) of axillary vein of right upper extremity (HCC) New this admission.  - Continue Xarelto for 3-6 months   IVDU (intravenous drug user), polysubstance abuse and withdrawal - Continue Suboxone - Consulted TOC   Thyroid nodule Incidental finding, TSH normal - Outpatient thyroid US recommended   Microcytic anemia Hgb stable in the 7s - Continue ferrous sulfate   Hepatitis C Mild transaminitis US liver unremarkable - Follow up with ID for treatment  DVT prophylaxis: Place TED hose Start: 09/15/23 0531   Code Status: Full Code  Family Communication:  None present at bedside.  Plan of care discussed with patient in length and he/she verbalized understanding and agreed with it.  Status is: Inpatient Remains inpatient appropriate because: History of IVDU, needs to be in the hospital till 09/30/2023 for IV antibiotics.   Estimated body mass index is 26.56 kg/m as calculated from the following:   Height as of this encounter: 5\' 3"  (1.6 m).   Weight as of this encounter: 68 kg.    Nutritional Assessment: Body mass index is 26.56 kg/m.Marland Kitchen Seen by dietician.  I agree with the assessment and plan as outlined below: Nutrition Status:        . Skin Assessment: I have examined the patient's skin and I agree with the wound assessment as performed by  the wound care RN as outlined below:    Consultants:  Orthopedics and ID  Procedures:  As above  Antimicrobials:  Anti-infectives (From admission, onward)    Start     Dose/Rate Route Frequency Ordered Stop   09/24/23 2345  metroNIDAZOLE (FLAGYL) tablet 500 mg        500 mg Oral Every 12 hours 09/24/23 2333 09/28/23 0959   09/17/23 1000  linezolid (ZYVOX) tablet 600 mg        600 mg Oral Every 12  hours 09/17/23 0814 09/18/23 2224   09/16/23 1400  ceFAZolin (ANCEF) IVPB 2g/100 mL premix        2 g 200 mL/hr over 30 Minutes Intravenous Every 8 hours 09/15/23 1850     09/16/23 0600  levofloxacin (LEVAQUIN) IVPB 500 mg        500 mg 100 mL/hr over 60 Minutes Intravenous On call to O.R. 09/15/23 1428 09/15/23 1553   09/16/23 0600  vancomycin (VANCOCIN) IVPB 1000 mg/200 mL premix  Status:  Discontinued        1,000 mg 200 mL/hr over 60 Minutes Intravenous On call to O.R. 09/15/23 1428 09/15/23 1431   09/15/23 1945  linezolid (ZYVOX) IVPB 600 mg  Status:  Discontinued        600 mg 300 mL/hr over 60 Minutes Intravenous Every 12 hours 09/15/23 1850 09/17/23 0814   09/15/23 1800  vancomycin (VANCOREADY) IVPB 750 mg/150 mL  Status:  Discontinued        750 mg 150 mL/hr over 60 Minutes Intravenous Every 12 hours 09/15/23 0530 09/15/23 1850   09/15/23 1617  vancomycin (VANCOCIN) powder  Status:  Discontinued          As needed 09/15/23 1617 09/15/23 1806   09/15/23 1600  cefTRIAXone (ROCEPHIN) 2 g in sodium chloride 0.9 % 100 mL IVPB  Status:  Discontinued        2 g 200 mL/hr over 30 Minutes Intravenous Every 24 hours 09/15/23 1048 09/15/23 1850   09/15/23 1456  vancomycin (VANCOCIN) 1-5 GM/200ML-% IVPB       Note to Pharmacy: Minor, Anneita S: cabinet override      09/15/23 1456 09/15/23 1515   09/15/23 0900  meropenem (MERREM) 1 g in sodium chloride 0.9 % 100 mL IVPB  Status:  Discontinued        1 g 200 mL/hr over 30 Minutes Intravenous Every 8 hours 09/15/23 0534 09/15/23 1004   09/15/23 0530  vancomycin (VANCOCIN) IVPB 1000 mg/200 mL premix        1,000 mg 200 mL/hr over 60 Minutes Intravenous  Once 09/15/23 0528 09/15/23 0849   09/15/23 0045  meropenem (MERREM) 1 g in sodium chloride 0.9 % 100 mL IVPB        1 g 200 mL/hr over 30 Minutes Intravenous  Once 09/15/23 0032 09/15/23 0149         Subjective: Patient seen and examined.  Sleeping.  No  complaints.  Objective: Vitals:   09/26/23 0432 09/26/23 2025 09/27/23 0445 09/27/23 0500  BP: (!) 134/59 (!) 157/65 (!) 140/68   Pulse: (!) 58 66 60   Resp: 18 18 18    Temp: 99 F (37.2 C) 99.7 F (37.6 C) 99 F (37.2 C)   TempSrc: Oral Oral Oral   SpO2: 99% 98% 100%   Weight:    68 kg  Height:        Intake/Output Summary (Last 24 hours) at 09/27/2023 4782 Last data filed at 09/26/2023  1658 Gross per 24 hour  Intake --  Output 550 ml  Net -550 ml   Filed Weights   09/17/23 0535 09/20/23 1900 09/27/23 0500  Weight: 62.4 kg 62.4 kg 68 kg    Examination:  General exam: Appears calm and comfortable  Respiratory system: Clear to auscultation. Respiratory effort normal. Cardiovascular system: S1 & S2 heard, RRR. No JVD, murmurs, rubs, gallops or clicks. No pedal edema. Gastrointestinal system: Abdomen is nondistended, soft and nontender. No organomegaly or masses felt. Normal bowel sounds heard. Central nervous system: Alert and oriented. No focal neurological deficits. Extremities: Symmetric 5 x 5 power. Skin: No rashes, lesions or ulcers.   Data Reviewed: I have personally reviewed following labs and imaging studies  CBC: Recent Labs  Lab 09/22/23 1059 09/24/23 1215 09/26/23 1625  WBC 9.2 6.4 4.6  NEUTROABS  --   --  1.6*  HGB 7.6* 7.5* 7.2*  HCT 24.6* 25.2* 24.2*  MCV 78.6* 82.1 81.5  PLT 397 383 396   Basic Metabolic Panel: Recent Labs  Lab 09/24/23 1215  NA 137  K 4.2  CL 102  CO2 26  GLUCOSE 97  BUN 13  CREATININE 0.58  CALCIUM 8.5*   GFR: Estimated Creatinine Clearance: 96.9 mL/min (by C-G formula based on SCr of 0.58 mg/dL). Liver Function Tests: Recent Labs  Lab 09/24/23 1215  AST 53*  ALT 34  ALKPHOS 160*  BILITOT 0.6  PROT 7.8  ALBUMIN 2.5*   No results for input(s): "LIPASE", "AMYLASE" in the last 168 hours. No results for input(s): "AMMONIA" in the last 168 hours. Coagulation Profile: No results for input(s): "INR", "PROTIME"  in the last 168 hours. Cardiac Enzymes: No results for input(s): "CKTOTAL", "CKMB", "CKMBINDEX", "TROPONINI" in the last 168 hours. BNP (last 3 results) No results for input(s): "PROBNP" in the last 8760 hours. HbA1C: No results for input(s): "HGBA1C" in the last 72 hours. CBG: No results for input(s): "GLUCAP" in the last 168 hours. Lipid Profile: No results for input(s): "CHOL", "HDL", "LDLCALC", "TRIG", "CHOLHDL", "LDLDIRECT" in the last 72 hours. Thyroid Function Tests: No results for input(s): "TSH", "T4TOTAL", "FREET4", "T3FREE", "THYROIDAB" in the last 72 hours. Anemia Panel: No results for input(s): "VITAMINB12", "FOLATE", "FERRITIN", "TIBC", "IRON", "RETICCTPCT" in the last 72 hours. Sepsis Labs: No results for input(s): "PROCALCITON", "LATICACIDVEN" in the last 168 hours.  Recent Results (from the past 240 hour(s))  Culture, blood (Routine X 2) w Reflex to ID Panel     Status: None   Collection Time: 09/17/23  1:24 PM   Specimen: BLOOD LEFT HAND  Result Value Ref Range Status   Specimen Description   Final    BLOOD LEFT HAND Performed at [facility], [address], [place], [place] [address]    Special Requests   Final    BOTTLES DRAWN AEROBIC ONLY Blood Culture results may not be optimal due to an inadequate volume of blood received in culture bottles Performed at [facility], [address] [place], [place] [address]    Culture   Final    NO GROWTH 5 DAYS Performed at [facility], [address] [place], [place] [address]    Report Status 09/22/2023 FINAL  Final  Culture, blood (Routine X 2) w Reflex to ID Panel     Status: None   Collection Time: 09/17/23  1:26 PM   Specimen: BLOOD LEFT HAND  Result Value Ref Range Status   Specimen Description   Final  BLOOD LEFT HAND Performed at [facility], [address], [place], [place] [address]    Special Requests   Final    BOTTLES DRAWN AEROBIC ONLY Blood Culture  results may not be optimal due to an inadequate volume of blood received in culture bottles Performed at [facility], [address] [place], [place] [address]    Culture   Final    NO GROWTH 5 DAYS Performed at [facility], [address], [place], [place] [address]    Report Status 09/22/2023 FINAL  Final  Culture, blood (Routine X 2) w Reflex to ID Panel     Status: None   Collection Time: 09/21/23  5:51 AM   Specimen: BLOOD LEFT HAND  Result Value Ref Range Status   Specimen Description   Final    BLOOD LEFT HAND Performed at [facility], [address], [place], [place] [address]    Special Requests   Final    BOTTLES DRAWN AEROBIC AND ANAEROBIC Blood Culture results may not be optimal due to an inadequate volume of blood received in culture bottles Performed at [facility], [address] [place], [place] [address]    Culture   Final    NO GROWTH 5 DAYS Performed at [facility], [address], [place], [place] [address]    Report Status 09/26/2023 FINAL  Final  Culture, blood (Routine X 2) w Reflex to ID Panel     Status: None   Collection Time: 09/21/23  5:54 AM   Specimen: BLOOD LEFT ARM  Result Value Ref Range Status   Specimen Description BLOOD LEFT ARM  Final   Special Requests   Final    BOTTLES DRAWN AEROBIC AND ANAEROBIC Blood Culture results may not be optimal due to an inadequate volume of blood received in culture bottles   Culture   Final    NO GROWTH 5 DAYS Performed at [facility], [address] [place], [place] [address]    Report Status 09/26/2023 FINAL  Final  Wet prep, genital     Status: Abnormal   Collection Time: 09/24/23 10:13 PM   Specimen: Vaginal  Result Value Ref Range Status   Yeast Wet Prep HPF POC NONE SEEN NONE SEEN Final   Trich, Wet Prep PRESENT (A) NONE SEEN Final   Clue Cells Wet Prep HPF POC NONE SEEN NONE SEEN Final   WBC, Wet Prep HPF POC <10 <10 Final    Sperm NONE SEEN  Final    Comment: Performed at [facility], [address], [place], [place] [address]     Radiology Studies: DG CHEST PORT 1 VIEW  Result Date: 09/25/2023 CLINICAL DATA:  Central chest pain. EXAM: PORTABLE CHEST 1 VIEW COMPARISON:  X-ray 09/20/2023 and older.  CT scan 09/15/2023. FINDINGS: Film is slightly rotated to the right. No consolidation, pneumothorax or effusion. Normal cardiopericardial silhouette without edema. Again as stated previously the lung nodules on CT are not well seen on this x-ray. Please see prior recommendation. IMPRESSION: No acute cardiopulmonary disease.  Rotated radiograph Electronically Signed   By: Karen Kays M.D.   On: 09/25/2023 17:06    Scheduled Meds:  buprenorphine-naloxone  1 tablet Sublingual TID   clotrimazole  1 Applicatorful Vaginal QHS   ferrous sulfate  325 mg Oral Q breakfast   metroNIDAZOLE  500 mg Oral Q12H   nicotine  21 mg Transdermal Daily  mouth rinse  15 mL Mouth Rinse 4 times per day   oxyCODONE  5 mg Oral Once   Rivaroxaban  15 mg Oral BID WC   Followed by   Melene Muller ON 10/08/2023] rivaroxaban  20 mg Oral Q supper   sodium chloride flush  3 mL Intravenous Q12H   Continuous Infusions:   ceFAZolin (ANCEF) IV 2 g (09/27/23 0532)     LOS: 12 days   Hughie Closs, MD [facility]  09/27/2023, 8:24 AM   *Please note that this is a verbal dictation therefore any spelling or grammatical errors are due to the "Dragon [employer] One" system interpretation.  Please page via Amion and do not message via secure chat for urgent patient care matters. Secure chat can be used for non urgent patient care matters.  How to contact the [facility] Attending or Consulting provider 7A - 7P or covering provider during after hours 7P -7A, for this patient?  Check the care team in [facility] and look for a) attending/consulting [facility] provider listed and b) the [facility] team listed. Page or secure chat 7A-7P. Log into [email or web address] and  use [place]'s universal password to access. If you do not have the password, please contact the hospital operator. Locate the [facility] provider you are looking for under [facility] and page to a number that you can be directly reached. If you still have difficulty reaching the provider, please page the [facility] (Director on Call) for the Hospitalists listed on amion for assistance.

## 2023-09-27 NOTE — Plan of Care (Signed)
   Problem: Education: Goal: Knowledge of General Education information will improve Description: Including pain rating scale, medication(s)/side effects and non-pharmacologic comfort measures Outcome: Progressing   Problem: Clinical Measurements: Goal: Ability to maintain clinical measurements within normal limits will improve Outcome: Progressing   Problem: Nutrition: Goal: Adequate nutrition will be maintained Outcome: Progressing   Problem: Pain Management: Goal: General experience of comfort will improve Outcome: Progressing   Problem: Safety: Goal: Ability to remain free from injury will improve Outcome: Progressing   Problem: Skin Integrity: Goal: Risk for impaired skin integrity will decrease Outcome: Progressing

## 2023-09-28 DIAGNOSIS — B955 Unspecified streptococcus as the cause of diseases classified elsewhere: Secondary | ICD-10-CM | POA: Diagnosis not present

## 2023-09-28 DIAGNOSIS — R7881 Bacteremia: Secondary | ICD-10-CM | POA: Diagnosis not present

## 2023-09-28 NOTE — Progress Notes (Signed)
PROGRESS NOTE    Laurie [name]  ZOX:[id number] DOB: 1996/01/11 DOA: 09/14/2023 PCP: Pcp, No   Brief Narrative:  27 y.o. F with hx IVDA and polysubstance abuse, hx familial factor V Leiden, smoking, PTSD and chronic hep C who presented with malaise, chest discomfort, draining arm wounds and right knee swelling. Found to have group A strep bacteremia, pulmonary nodules, right arm DVT, right knee septic arthritis.  Details below.  Assessment & Plan:   Principal Problem:   Group A streptococcal bacteremia Active Problems:   Recurrent fever   Septic arthritis of knee, right (HCC)   Multiple lung nodules-secondary to presumed septic emboli   Acute deep vein thrombosis (DVT) of axillary vein of right upper extremity (HCC)   IVDU (intravenous drug user), polysubstance abuse and withdrawal   Hepatitis C   Microcytic anemia   Thyroid nodule  Group A streptococcal bacteremia Possible endocarditis TEE negative but constellation of streptococcal bacteremia, right knee septic arthritis and lung nodules implies endocarditis.  ID recommends treating as such with 2 weeks IV antibiotics then 4 more weeks oral antibiotics. - Continue cefazolin (plan IV cefazolin until Dec 10 then oral therapy with EOT Jan 7)  Recurrent fever Spiked fever to 102 last 3 days with last spike of 100.8 at 8:30 AM on 09/22/2023.  Afebrile ever since until 8 PM 09/24/2023 when she was noted to have low-grade fever of 100.5, Could be an error.  Patient denies any respiratory symptoms and chest x-ray obtained by ID was unremarkable.  Patient is afebrile again.  Repeat blood culture negative from 09/21/2023.   Septic arthritis of knee, right (HCC) S/p irrigation and debridement right knee by Dr. Blanchie Dessert 11/25 Aspirated by Dr. Madilyn Hook in the ER 11/25.  Aspirate with 63K WBCs, gram stain positive but culture negative.  Taken to OR for washout Drain now removed. - WBAT - ROM as tolerated - PT - Leave tegaderms in place for 2  weeks or unless removed by Orthopedic surgery; sutures to be removed at 2-3 weeks (I.e. Dec 9-16)   Multiple lung nodules-secondary to presumed septic emboli - Repeat imaging as an outpatient per ID   Acute deep vein thrombosis (DVT) of axillary vein of right upper extremity (HCC) New this admission.  - Continue Xarelto for 3-6 months   IVDU (intravenous drug user), polysubstance abuse and withdrawal - Continue Suboxone - Consulted TOC   Thyroid nodule Incidental finding, TSH normal - Outpatient thyroid US recommended   Microcytic anemia Hgb stable in the 7s - Continue ferrous sulfate   Hepatitis C Mild transaminitis US liver unremarkable - Follow up with ID for treatment  DVT prophylaxis: Place TED hose Start: 09/15/23 0531   Code Status: Full Code  Family Communication:  None present at bedside.  Plan of care discussed with patient in length and he/she verbalized understanding and agreed with it.  Status is: Inpatient Remains inpatient appropriate because: History of IVDU, needs to be in the hospital till 09/30/2023 for IV antibiotics.   Estimated body mass index is 26.56 kg/m as calculated from the following:   Height as of this encounter: 5\' 3"  (1.6 m).   Weight as of this encounter: 68 kg.    Nutritional Assessment: Body mass index is 26.56 kg/m.Marland Kitchen Seen by dietician.  I agree with the assessment and plan as outlined below: Nutrition Status:        . Skin Assessment: I have examined the patient's skin and I agree with the wound assessment as performed by  the wound care RN as outlined below:    Consultants:  Orthopedics and ID  Procedures:  As above  Antimicrobials:  Anti-infectives (From admission, onward)    Start     Dose/Rate Route Frequency Ordered Stop   09/24/23 2345  metroNIDAZOLE (FLAGYL) tablet 500 mg        500 mg Oral Every 12 hours 09/24/23 2333 09/27/23 2202   09/17/23 1000  linezolid (ZYVOX) tablet 600 mg        600 mg Oral Every 12  hours 09/17/23 0814 09/18/23 2224   09/16/23 1400  ceFAZolin (ANCEF) IVPB 2g/100 mL premix        2 g 200 mL/hr over 30 Minutes Intravenous Every 8 hours 09/15/23 1850     09/16/23 0600  levofloxacin (LEVAQUIN) IVPB 500 mg        500 mg 100 mL/hr over 60 Minutes Intravenous On call to O.R. 09/15/23 1428 09/15/23 1553   09/16/23 0600  vancomycin (VANCOCIN) IVPB 1000 mg/200 mL premix  Status:  Discontinued        1,000 mg 200 mL/hr over 60 Minutes Intravenous On call to O.R. 09/15/23 1428 09/15/23 1431   09/15/23 1945  linezolid (ZYVOX) IVPB 600 mg  Status:  Discontinued        600 mg 300 mL/hr over 60 Minutes Intravenous Every 12 hours 09/15/23 1850 09/17/23 0814   09/15/23 1800  vancomycin (VANCOREADY) IVPB 750 mg/150 mL  Status:  Discontinued        750 mg 150 mL/hr over 60 Minutes Intravenous Every 12 hours 09/15/23 0530 09/15/23 1850   09/15/23 1617  vancomycin (VANCOCIN) powder  Status:  Discontinued          As needed 09/15/23 1617 09/15/23 1806   09/15/23 1600  cefTRIAXone (ROCEPHIN) 2 g in sodium chloride 0.9 % 100 mL IVPB  Status:  Discontinued        2 g 200 mL/hr over 30 Minutes Intravenous Every 24 hours 09/15/23 1048 09/15/23 1850   09/15/23 1456  vancomycin (VANCOCIN) 1-5 GM/200ML-% IVPB       Note to Pharmacy: Minor, Anneita S: cabinet override      09/15/23 1456 09/15/23 1515   09/15/23 0900  meropenem (MERREM) 1 g in sodium chloride 0.9 % 100 mL IVPB  Status:  Discontinued        1 g 200 mL/hr over 30 Minutes Intravenous Every 8 hours 09/15/23 0534 09/15/23 1004   09/15/23 0530  vancomycin (VANCOCIN) IVPB 1000 mg/200 mL premix        1,000 mg 200 mL/hr over 60 Minutes Intravenous  Once 09/15/23 0528 09/15/23 0849   09/15/23 0045  meropenem (MERREM) 1 g in sodium chloride 0.9 % 100 mL IVPB        1 g 200 mL/hr over 30 Minutes Intravenous  Once 09/15/23 0032 09/15/23 0149         Subjective: Patient seen and examined.  Reviewed notes from the nursing that patient  has been refusing dressing changes.  Discussed with the patient, she says that she has not declined any dressing changes at all.  Objective: Vitals:   09/27/23 1407 09/27/23 1930 09/28/23 0509 09/28/23 1308  BP: (!) 104/56 (!) 129/47 (!) 135/57 (!) 130/56  Pulse: (!) 59 62 65 68  Resp: 16 16 16 20   Temp: 98.4 F (36.9 C) 98.3 F (36.8 C) 99 F (37.2 C) 98.2 F (36.8 C)  TempSrc: Oral Oral Oral   SpO2: 100% 99% 100% 97%  Weight:      Height:        Intake/Output Summary (Last 24 hours) at 09/28/2023 1312 Last data filed at 09/28/2023 0917 Gross per 24 hour  Intake 1307.88 ml  Output 400 ml  Net 907.88 ml   Filed Weights   09/17/23 0535 09/20/23 1900 09/27/23 0500  Weight: 62.4 kg 62.4 kg 68 kg    Examination:  General exam: Appears calm and comfortable  Respiratory system: Clear to auscultation. Respiratory effort normal. Cardiovascular system: S1 & S2 heard, RRR. No JVD, murmurs, rubs, gallops or clicks. No pedal edema. Gastrointestinal system: Abdomen is nondistended, soft and nontender. No organomegaly or masses felt. Normal bowel sounds heard. Central nervous system: Alert and oriented. No focal neurological deficits. Extremities: Symmetric 5 x 5 power. Skin: No rashes, lesions or ulcers.   Data Reviewed: I have personally reviewed following labs and imaging studies  CBC: Recent Labs  Lab 09/22/23 1059 09/24/23 1215 09/26/23 1625  WBC 9.2 6.4 4.6  NEUTROABS  --   --  1.6*  HGB 7.6* 7.5* 7.2*  HCT 24.6* 25.2* 24.2*  MCV 78.6* 82.1 81.5  PLT 397 383 396   Basic Metabolic Panel: Recent Labs  Lab 09/24/23 1215  NA 137  K 4.2  CL 102  CO2 26  GLUCOSE 97  BUN 13  CREATININE 0.58  CALCIUM 8.5*   GFR: Estimated Creatinine Clearance: 96.9 mL/min (by C-G formula based on SCr of 0.58 mg/dL). Liver Function Tests: Recent Labs  Lab 09/24/23 1215  AST 53*  ALT 34  ALKPHOS 160*  BILITOT 0.6  PROT 7.8  ALBUMIN 2.5*   No results for input(s):  "LIPASE", "AMYLASE" in the last 168 hours. No results for input(s): "AMMONIA" in the last 168 hours. Coagulation Profile: No results for input(s): "INR", "PROTIME" in the last 168 hours. Cardiac Enzymes: No results for input(s): "CKTOTAL", "CKMB", "CKMBINDEX", "TROPONINI" in the last 168 hours. BNP (last 3 results) No results for input(s): "PROBNP" in the last 8760 hours. HbA1C: No results for input(s): "HGBA1C" in the last 72 hours. CBG: No results for input(s): "GLUCAP" in the last 168 hours. Lipid Profile: No results for input(s): "CHOL", "HDL", "LDLCALC", "TRIG", "CHOLHDL", "LDLDIRECT" in the last 72 hours. Thyroid Function Tests: No results for input(s): "TSH", "T4TOTAL", "FREET4", "T3FREE", "THYROIDAB" in the last 72 hours. Anemia Panel: No results for input(s): "VITAMINB12", "FOLATE", "FERRITIN", "TIBC", "IRON", "RETICCTPCT" in the last 72 hours. Sepsis Labs: No results for input(s): "PROCALCITON", "LATICACIDVEN" in the last 168 hours.  Recent Results (from the past 240 hour(s))  Culture, blood (Routine X 2) w Reflex to ID Panel     Status: None   Collection Time: 09/21/23  5:51 AM   Specimen: BLOOD LEFT HAND  Result Value Ref Range Status   Specimen Description   Final    BLOOD LEFT HAND Performed at [facility], [address] [place], [place] [address]    Special Requests   Final    BOTTLES DRAWN AEROBIC AND ANAEROBIC Blood Culture results may not be optimal due to an inadequate volume of blood received in culture bottles Performed at [facility], [address] [place], [place] [address]    Culture   Final    NO GROWTH 5 DAYS Performed at [facility], [address] [place], [place] [address]    Report Status 09/26/2023 FINAL  Final  Culture, blood (Routine X 2) w Reflex to ID Panel     Status:  None   Collection Time: 09/21/23  5:54 AM   Specimen: BLOOD LEFT ARM  Result Value Ref Range Status   Specimen Description BLOOD LEFT  ARM  Final   Special Requests   Final    BOTTLES DRAWN AEROBIC AND ANAEROBIC Blood Culture results may not be optimal due to an inadequate volume of blood received in culture bottles   Culture   Final    NO GROWTH 5 DAYS Performed at [facility], [address] [place], [place] [address]    Report Status 09/26/2023 FINAL  Final  Wet prep, genital     Status: Abnormal   Collection Time: 09/24/23 10:13 PM   Specimen: Vaginal  Result Value Ref Range Status   Yeast Wet Prep HPF POC NONE SEEN NONE SEEN Final   Trich, Wet Prep PRESENT (A) NONE SEEN Final   Clue Cells Wet Prep HPF POC NONE SEEN NONE SEEN Final   WBC, Wet Prep HPF POC <10 <10 Final   Sperm NONE SEEN  Final    Comment: Performed at [facility], [address], [place], [place] [address]     Radiology Studies: No results found.  Scheduled Meds:  buprenorphine-naloxone  1 tablet Sublingual TID   clotrimazole  1 Applicatorful Vaginal QHS   ferrous sulfate  325 mg Oral Q breakfast   nicotine  21 mg Transdermal Daily   mouth rinse  15 mL Mouth Rinse 4 times per day   oxyCODONE  5 mg Oral Once   Rivaroxaban  15 mg Oral BID WC   Followed by   Melene Muller ON 10/08/2023] rivaroxaban  20 mg Oral Q supper   sodium chloride flush  3 mL Intravenous Q12H   Continuous Infusions:   ceFAZolin (ANCEF) IV Stopped (09/28/23 0981)     LOS: 13 days   Hughie Closs, MD [facility]  09/28/2023, 1:12 PM   *Please note that this is a verbal dictation therefore any spelling or grammatical errors are due to the "Dragon [employer] One" system interpretation.  Please page via Amion and do not message via secure chat for urgent patient care matters. Secure chat can be used for non urgent patient care matters.  How to contact the [facility] Attending or Consulting provider 7A - 7P or covering provider during after hours 7P -7A, for this patient?  Check the care team in [facility] and look for a) attending/consulting TRH provider  listed and b) the [facility] team listed. Page or secure chat 7A-7P. Log into [email or web address] and use [place]'s universal password to access. If you do not have the password, please contact the hospital operator. Locate the [facility] provider you are looking for under [facility] and page to a number that you can be directly reached. If you still have difficulty reaching the provider, please page the [facility] (Director on Call) for the Hospitalists listed on amion for assistance.

## 2023-09-28 NOTE — Progress Notes (Signed)
After several attempts, PT continues to refuse dressing changes or even to allow this RN to assess her wounds. Refuses labs this AM. NP notified of refusals.

## 2023-09-28 NOTE — Plan of Care (Signed)
  Problem: Education: Goal: Knowledge of General Education information will improve Description: Including pain rating scale, medication(s)/side effects and non-pharmacologic comfort measures Outcome: Progressing   Problem: Health Behavior/Discharge Planning: Goal: Ability to manage health-related needs will improve Outcome: Progressing   Problem: Activity: Goal: Risk for activity intolerance will decrease Outcome: Progressing   Problem: Pain Management: Goal: General experience of comfort will improve Outcome: Progressing   Problem: Safety: Goal: Ability to remain free from injury will improve Outcome: Progressing

## 2023-09-29 DIAGNOSIS — M00861 Arthritis due to other bacteria, right knee: Secondary | ICD-10-CM | POA: Diagnosis not present

## 2023-09-29 DIAGNOSIS — B955 Unspecified streptococcus as the cause of diseases classified elsewhere: Secondary | ICD-10-CM | POA: Diagnosis not present

## 2023-09-29 DIAGNOSIS — R509 Fever, unspecified: Secondary | ICD-10-CM | POA: Diagnosis not present

## 2023-09-29 DIAGNOSIS — R7881 Bacteremia: Secondary | ICD-10-CM | POA: Diagnosis not present

## 2023-09-29 DIAGNOSIS — B951 Streptococcus, group B, as the cause of diseases classified elsewhere: Secondary | ICD-10-CM | POA: Diagnosis not present

## 2023-09-29 MED ORDER — ORITAVANCIN DIPHOSPHATE 400 MG IV SOLR
1200.0000 mg | Freq: Once | INTRAVENOUS | Status: AC
  Administered 2023-09-30: 1200 mg via INTRAVENOUS
  Filled 2023-09-29: qty 120

## 2023-09-29 MED ORDER — MELATONIN 5 MG PO TABS
5.0000 mg | ORAL_TABLET | Freq: Every evening | ORAL | Status: DC | PRN
  Administered 2023-09-29 (×2): 5 mg via ORAL
  Filled 2023-09-29 (×2): qty 1

## 2023-09-29 NOTE — Progress Notes (Addendum)
[facility]  Date of Admission:  09/14/2023    Principal Problem:   Group A streptococcal bacteremia Active Problems:   Hepatitis C   Septic arthritis of knee, right (HCC)   Multiple lung nodules-secondary to presumed septic emboli   Acute deep vein thrombosis (DVT) of axillary vein of right upper extremity (HCC)   Microcytic anemia   IVDU (intravenous drug user), polysubstance abuse and withdrawal   Thyroid nodule   Recurrent fever          Assessment: 27 year old female admitted with: #Group A strep bacteremia/SSTI/right knee septic arthritis concern for endocarditis given pulmonary nodule #Fever #Polysubstance abuse - She was initially cefazolin linezolid then transition to cefazolin alone -TTE and TEE without vegetation #DVT of upper right arm - Defer to primary for anticoagulation - Given SSTI and fevers, patient notes no worsening of lesions. #Chronic hep C - Viral load for hepatitis C is elevated at 1.45 million.  INR slightly elevated.  1.4 - Management outpatient Recommendations: -Planned for patient's last day of cefazolin is 12/10 then plan of p.o. antibiotic x 4 weeks.  She states she would like to leave tomorrow.  Will plan on stopping cefazolin tomorrow and giving her a dose of oritavancin.   ID follow-up on 12/20 to start cefadroxil 1 g twice daily to complete total 6 weeks EOT 10/28/2023 - Follow-up with infectious disease on 12/20 with myself    Antibiotics: cefazolin Cultures: Blood 11/24 GAS 11/27 NG 11/30 Urine   Other   SUBJECTIVE: Resting in bed. No new complaints.  Inquiring about discharge Interval:  Review of Systems: Review of Systems  All other systems reviewed and are negative.    Scheduled Meds:  buprenorphine-naloxone  1 tablet Sublingual TID   clotrimazole  1 Applicatorful Vaginal QHS   ferrous sulfate  325 mg Oral Q breakfast   nicotine  21 mg Transdermal Daily   mouth rinse  15 mL  Mouth Rinse 4 times per day   oxyCODONE  5 mg Oral Once   Rivaroxaban  15 mg Oral BID WC   Followed by   Melene Muller ON 10/08/2023] rivaroxaban  20 mg Oral Q supper   sodium chloride flush  3 mL Intravenous Q12H   Continuous Infusions:   ceFAZolin (ANCEF) IV 2 g (09/29/23 0526)   [START ON 09/30/2023] Oritavancin Diphosphate (ORBACTIV) 1,200 mg in dextrose 5 % IVPB     PRN Meds:.acetaminophen **OR** acetaminophen, melatonin, methocarbamol, nicotine polacrilex, ondansetron **OR** ondansetron (ZOFRAN) IV, mouth rinse, oxyCODONE, senna-docusate, sodium chloride flush Allergies  Allergen Reactions   Keflex [Cephalexin] Hives   Penicillins Other (See Comments)    Results thru an allergy skin test Has patient had a PCN reaction causing immediate rash, facial/tongue/throat swelling, SOB or lightheadedness with hypotension: No Has patient had a PCN reaction causing severe rash involving mucus membranes or skin necrosis: No Has patient had a PCN reaction that required hospitalization No Has patient had a PCN reaction occurring within the last 10 years: No If all of the above answers are "NO", then may proceed with Cephalosporin use.      OBJECTIVE: Vitals:   09/28/23 0509 09/28/23 1308 09/28/23 2007 09/29/23 0451  BP: (!) 135/57 (!) 130/56 (!) 122/51 (!) 119/50  Pulse: 65 68 62 68  Resp: 16 20 16 16   Temp: 99 F (37.2 C) 98.2 F (36.8 C) 99.3 F (37.4 C) 99.6 F (37.6 C)  TempSrc: Oral  Oral Oral  SpO2: 100% 97% 100% 99%  Weight:      Height:       Body mass index is 26.56 kg/m.  Physical Exam Constitutional:      Appearance: Normal appearance.  HENT:     Head: Normocephalic and atraumatic.     Right Ear: Tympanic membrane normal.     Left Ear: Tympanic membrane normal.     Nose: Nose normal.     Mouth/Throat:     Mouth: Mucous membranes are moist.  Eyes:     Extraocular Movements: Extraocular movements intact.     Conjunctiva/sclera: Conjunctivae normal.     Pupils:  Pupils are equal, round, and reactive to light.  Cardiovascular:     Rate and Rhythm: Normal rate and regular rhythm.     Heart sounds: No murmur heard.    No friction rub. No gallop.  Pulmonary:     Effort: Pulmonary effort is normal.     Breath sounds: Normal breath sounds.  Abdominal:     General: Abdomen is flat.     Palpations: Abdomen is soft.  Musculoskeletal:        General: Normal range of motion.  Skin:    General: Skin is warm and dry.  Neurological:     General: No focal deficit present.     Mental Status: She is alert and oriented to person, place, and time.  Psychiatric:        Mood and Affect: Mood normal.       Lab Results Lab Results  Component Value Date   WBC 4.6 09/26/2023   HGB 7.2 (L) 09/26/2023   HCT 24.2 (L) 09/26/2023   MCV 81.5 09/26/2023   PLT 396 09/26/2023    Lab Results  Component Value Date   CREATININE 0.58 09/24/2023   BUN 13 09/24/2023   NA 137 09/24/2023   K 4.2 09/24/2023   CL 102 09/24/2023   CO2 26 09/24/2023    Lab Results  Component Value Date   ALT 34 09/24/2023   AST 53 (H) 09/24/2023   ALKPHOS 160 (H) 09/24/2023   BILITOT 0.6 09/24/2023        Danelle Earthly, MD [facility] 09/29/2023, 10:44 AM   I have personally spent 54 minutes involved in face-to-face and non-face-to-face activities for this patient on the day of the visit. Professional time spent includes the following activities: Preparing to see the patient (review of tests), Obtaining and/or reviewing separately obtained history (admission/discharge record), Performing a medically appropriate examination and/or evaluation , Ordering medications/tests/procedures, referring and communicating with other health care professionals, Documenting clinical information in the EMR, Independently interpreting results (not separately reported), Communicating results to the patient/family/caregiver, Counseling and  educating the patient/family/caregiver and Care coordination (not separately reported).

## 2023-09-29 NOTE — TOC Progression Note (Signed)
Transition of Care [facility]) - Progression Note    Patient Details  Name: Laurie [name] MRN: [id number] Date of Birth: [birth year]  Transition of [facility]) CM/SW Contact  Otelia Santee, LCSW Phone Number: 09/29/2023, 2:51 PM  Clinical Narrative:    RW has been ordered for pt through [employer]. RW to be delivered to pt's room prior to discharge.    Expected Discharge Plan: Homeless Shelter Barriers to Discharge: No Barriers Identified  Expected Discharge Plan and Services In-house Referral: Clinical Social Work Discharge Planning Services: NA Post Acute Care Choice: NA Living arrangements for the past 2 months: Homeless                 DME Arranged: Walker rolling DME Agency: [employer] Date DME Agency Contacted: 09/29/23 Time DME Agency Contacted: 1451 Representative spoke with at DME Agency: Zack             Social Determinants of Health (SDOH) Interventions SDOH Screenings   Food Insecurity: Food Insecurity Present (09/15/2023)  Housing: High Risk (09/17/2023)  Transportation Needs: Unmet Transportation Needs (09/15/2023)  Utilities: At Risk (09/15/2023)  Depression (PHQ2-9): Medium Risk (04/26/2020)  Financial Resource Strain: Low Risk  (06/04/2018)  Social Connections: Unknown (03/05/2022)   Received from [facility]  Tobacco Use: High Risk (09/15/2023)    Readmission Risk Interventions    09/17/2023   11:03 AM  Readmission Risk Prevention Plan  Transportation Screening Complete  PCP or Specialist Appt within 5-7 Days Complete  Home Care Screening Complete  Medication Review (RN CM) Complete

## 2023-09-29 NOTE — Progress Notes (Signed)
Physical Therapy Treatment Patient Details Name: Laurie [name] MRN: [id number] DOB: [birth year] Today's Date: 09/29/2023   History of Present Illness 27 y.o. F with hx IVDA and polysubstance abuse, hx familial factor V Leiden, smoking, PTSD and chronic hep C who presented with malaise, chest discomfort, draining arm wounds and right knee swelling.   Found to have group A strep bacteremia, pulmonary nodules, right arm DVT, right knee septic arthritis.    PT Comments  Pt with good improvement today.  She did receive oxycodone and robaxin ~1 hr prior to PT.  Still with limited weight shift to R LE with ambulation but significantly improved ROM with exercises/stretching.  Did ambulate 59' with RW and supervision.  Continue to recommend outpt PT at d/c.     If plan is discharge home, recommend the following: A little help with walking and/or transfers;Assistance with cooking/housework;Assist for transportation;Help with stairs or ramp for entrance;A little help with bathing/dressing/bathroom   Can travel by private vehicle        Equipment Recommendations  Rolling walker (2 wheels)    Recommendations for Other Services       Precautions / Restrictions Precautions Precautions: Fall Restrictions RLE Weight Bearing: Weight bearing as tolerated     Mobility  Bed Mobility Overal bed mobility: Modified Independent       Supine to sit: Modified independent (Device/Increase time)          Transfers Overall transfer level: Needs assistance Equipment used: Rolling walker (2 wheels) Transfers: Sit to/from Stand Sit to Stand: Supervision           General transfer comment: Has been transferring to [facility] indpeendently.  Demonstrated safely but with decreased weight R LE    Ambulation/Gait Ambulation/Gait assistance: Supervision Gait Distance (Feet): 80 Feet Assistive device: Rolling walker (2 wheels) Gait Pattern/deviations: Step-to pattern, Decreased stride length, Knee flexed in  stance - right, Antalgic, Decreased weight shift to right Gait velocity: decreased     General Gait Details: Walks on R toes with knee flexed due to pain, very little weight on R LE.  Too painful to try foot flat   Stairs             Wheelchair Mobility     Tilt Bed    Modified Rankin (Stroke Patients Only)       Balance Overall balance assessment: Needs assistance Sitting-balance support: No upper extremity supported Sitting balance-Leahy Scale: Normal       Standing balance-Leahy Scale: Fair Standing balance comment: Can stand and pivot to bsc using bed rail; RW to ambulate; also mainly balancing on L LE (limited use of R)                            Cognition Arousal: Alert Behavior During Therapy: WFL for tasks assessed/performed Overall Cognitive Status: Within Functional Limits for tasks assessed                                          Exercises Total Joint Exercises Quad Sets: AROM, Right, 20 reps, Seated (10x2) Long Arc Quad: AROM, Right, Seated (10x2) Long Arc Quad Limitations: unable to achieve end range, educated on AAROM with L LE for improved range Knee Flexion: AAROM, Right, Seated (10x2);10 sec hold Goniometric ROM: ROM ~8 to 90 degrees Knee ext stretch: 30 sec hold x 4 Left  in low load long duration ext stretch (pillow under foot)    General Comments   Provided with HEP  Access Code: [email or web address] URL: [email or web address] Date: 09/29/2023 Prepared by: Royetta Asal  Program Notes You do not have to do all of these.  I provided several examples to work the same thing - would be good to vary the exercises.  There are 3 main types: 1)Working on your knee straightening/stretching range of motion; 2) Working on your ability to actively straighten your knee and strengthen; 3)Working on your knee bending.    Exercises - Quad Setting and Stretching  - 3 x daily - 7 x weekly - 1 sets - 10 reps - Seated  Passive Knee Extension  - 3 x daily - 7 x weekly - 1 sets - 1 reps - Standing Quad Set  - 3 x daily - 7 x weekly - 1 sets - 10 reps - Seated Long Arc Quad  - 3 x daily - 7 x weekly - 1 sets - 10 reps - Seated Knee Extension AAROM  - 3 x daily - 7 x weekly - 1 sets - 10 reps - Seated Heel Slide  - 3 x daily - 7 x weekly - 1 sets - 10 reps - 10 sec hold - Seated Knee Flexion AAROM  - 3 x daily - 7 x weekly - 1 sets - 10 reps - 10sec hold -Low load stretch - resting with pillow under foot with knee straight     Pertinent Vitals/Pain Pain Assessment Pain Assessment: 0-10 Pain Score: 7  Pain Descriptors / Indicators: Discomfort, Burning Pain Intervention(s): Limited activity within patient's tolerance, Monitored during session, Premedicated before session, Repositioned    Home Living                          Prior Function            PT Goals (current goals can now be found in the care plan section) Progress towards PT goals: Progressing toward goals    Frequency    Min 1X/week      PT Plan      Co-evaluation              AM-PAC PT "6 Clicks" Mobility   Outcome Measure  Help needed turning from your back to your side while in a flat bed without using bedrails?: None Help needed moving from lying on your back to sitting on the side of a flat bed without using bedrails?: None Help needed moving to and from a bed to a chair (including a wheelchair)?: None Help needed standing up from a chair using your arms (e.g., wheelchair or bedside chair)?: None Help needed to walk in hospital room?: A Little Help needed climbing 3-5 steps with a railing? : A Lot 6 Click Score: 21    End of Session Equipment Utilized During Treatment: Gait belt Activity Tolerance: Patient limited by pain Patient left: in bed;with call bell/phone within reach Nurse Communication: Mobility status PT Visit Diagnosis: Unsteadiness on feet (R26.81);Pain;Difficulty in walking, not elsewhere  classified (R26.2) Pain - Right/Left: Right Pain - part of body: Knee     Time: 4098-1191 PT Time Calculation (min) (ACUTE ONLY): 24 min  Charges:    $Gait Training: 8-22 mins $Therapeutic Exercise: 8-22 mins PT General Charges $$ ACUTE PT VISIT: 1 Visit  Anise Salvo, PT Acute Rehab [facility] [phone]    Rayetta Humphrey 09/29/2023, 5:09 PM

## 2023-09-29 NOTE — Progress Notes (Signed)
PROGRESS NOTE    Laurie [name]  FAO:[id number] DOB: August 12, 1996 DOA: 09/14/2023 PCP: Pcp, No   Brief Narrative:  27 y.o. F with hx IVDA and polysubstance abuse, hx familial factor V Leiden, smoking, PTSD and chronic hep C who presented with malaise, chest discomfort, draining arm wounds and right knee swelling. Found to have group A strep bacteremia, pulmonary nodules, right arm DVT, right knee septic arthritis.  Details below.  Assessment & Plan:   Principal Problem:   Group A streptococcal bacteremia Active Problems:   Recurrent fever   Septic arthritis of knee, right (HCC)   Multiple lung nodules-secondary to presumed septic emboli   Acute deep vein thrombosis (DVT) of axillary vein of right upper extremity (HCC)   IVDU (intravenous drug user), polysubstance abuse and withdrawal   Hepatitis C   Microcytic anemia   Thyroid nodule  Group A streptococcal bacteremia Possible endocarditis TEE negative but constellation of streptococcal bacteremia, right knee septic arthritis and lung nodules implies endocarditis.  ID recommends treating as such with 2 weeks IV antibiotics then 4 more weeks oral antibiotics. - Continue cefazolin (plan IV cefazolin until Dec 10 then oral therapy with EOT Jan 7)  Recurrent fever Spiked fever to 102 last 3 days with last spike of 100.8 at 8:30 AM on 09/22/2023.  Afebrile ever since until 8 PM 09/24/2023 when she was noted to have low-grade fever of 100.5, Could be an error.  Patient denies any respiratory symptoms and chest x-ray obtained by ID was unremarkable.  Patient is afebrile again.  Repeat blood culture negative from 09/21/2023.   Septic arthritis of knee, right (HCC) S/p irrigation and debridement right knee by Dr. Blanchie Dessert 11/25 Aspirated by Dr. Madilyn Hook in the ER 11/25.  Aspirate with 63K WBCs, gram stain positive but culture negative.  Taken to OR for washout Drain now removed. - WBAT - ROM as tolerated - PT - Leave tegaderms in place for 2  weeks or unless removed by Orthopedic surgery; sutures to be removed at 2-3 weeks (I.e. Dec 9-16)   Multiple lung nodules-secondary to presumed septic emboli - Repeat imaging as an outpatient per ID   Acute deep vein thrombosis (DVT) of axillary vein of right upper extremity (HCC) New this admission.  - Continue Xarelto for 3-6 months   IVDU (intravenous drug user), polysubstance abuse and withdrawal - Continue Suboxone - Consulted TOC   Thyroid nodule Incidental finding, TSH normal - Outpatient thyroid US recommended   Microcytic anemia Hgb stable in the 7s - Continue ferrous sulfate   Hepatitis C Mild transaminitis US liver unremarkable - Follow up with ID for treatment  DVT prophylaxis: Place TED hose Start: 09/15/23 0531   Code Status: Full Code  Family Communication:  None present at bedside.  Plan of care discussed with patient in length and he/she verbalized understanding and agreed with it.  Status is: Inpatient Remains inpatient appropriate because: History of IVDU, needs to be in the hospital till 09/30/2023 for IV antibiotics.   Estimated body mass index is 26.56 kg/m as calculated from the following:   Height as of this encounter: 5\' 3"  (1.6 m).   Weight as of this encounter: 68 kg.    Nutritional Assessment: Body mass index is 26.56 kg/m.Marland Kitchen Seen by dietician.  I agree with the assessment and plan as outlined below: Nutrition Status:        . Skin Assessment: I have examined the patient's skin and I agree with the wound assessment as performed by  the wound care RN as outlined below:    Consultants:  Orthopedics and ID  Procedures:  As above  Antimicrobials:  Anti-infectives (From admission, onward)    Start     Dose/Rate Route Frequency Ordered Stop   09/24/23 2345  metroNIDAZOLE (FLAGYL) tablet 500 mg        500 mg Oral Every 12 hours 09/24/23 2333 09/27/23 2202   09/17/23 1000  linezolid (ZYVOX) tablet 600 mg        600 mg Oral Every 12  hours 09/17/23 0814 09/18/23 2224   09/16/23 1400  ceFAZolin (ANCEF) IVPB 2g/100 mL premix        2 g 200 mL/hr over 30 Minutes Intravenous Every 8 hours 09/15/23 1850     09/16/23 0600  levofloxacin (LEVAQUIN) IVPB 500 mg        500 mg 100 mL/hr over 60 Minutes Intravenous On call to O.R. 09/15/23 1428 09/15/23 1553   09/16/23 0600  vancomycin (VANCOCIN) IVPB 1000 mg/200 mL premix  Status:  Discontinued        1,000 mg 200 mL/hr over 60 Minutes Intravenous On call to O.R. 09/15/23 1428 09/15/23 1431   09/15/23 1945  linezolid (ZYVOX) IVPB 600 mg  Status:  Discontinued        600 mg 300 mL/hr over 60 Minutes Intravenous Every 12 hours 09/15/23 1850 09/17/23 0814   09/15/23 1800  vancomycin (VANCOREADY) IVPB 750 mg/150 mL  Status:  Discontinued        750 mg 150 mL/hr over 60 Minutes Intravenous Every 12 hours 09/15/23 0530 09/15/23 1850   09/15/23 1617  vancomycin (VANCOCIN) powder  Status:  Discontinued          As needed 09/15/23 1617 09/15/23 1806   09/15/23 1600  cefTRIAXone (ROCEPHIN) 2 g in sodium chloride 0.9 % 100 mL IVPB  Status:  Discontinued        2 g 200 mL/hr over 30 Minutes Intravenous Every 24 hours 09/15/23 1048 09/15/23 1850   09/15/23 1456  vancomycin (VANCOCIN) 1-5 GM/200ML-% IVPB       Note to Pharmacy: Minor, Anneita S: cabinet override      09/15/23 1456 09/15/23 1515   09/15/23 0900  meropenem (MERREM) 1 g in sodium chloride 0.9 % 100 mL IVPB  Status:  Discontinued        1 g 200 mL/hr over 30 Minutes Intravenous Every 8 hours 09/15/23 0534 09/15/23 1004   09/15/23 0530  vancomycin (VANCOCIN) IVPB 1000 mg/200 mL premix        1,000 mg 200 mL/hr over 60 Minutes Intravenous  Once 09/15/23 0528 09/15/23 0849   09/15/23 0045  meropenem (MERREM) 1 g in sodium chloride 0.9 % 100 mL IVPB        1 g 200 mL/hr over 30 Minutes Intravenous  Once 09/15/23 0032 09/15/23 0149         Subjective: Patient seen and examined.  She has no complaints.  She is excited about  going home tomorrow.  She says that she is going to live at her husband's brothers home.  Objective: Vitals:   09/28/23 0509 09/28/23 1308 09/28/23 2007 09/29/23 0451  BP: (!) 135/57 (!) 130/56 (!) 122/51 (!) 119/50  Pulse: 65 68 62 68  Resp: 16 20 16 16   Temp: 99 F (37.2 C) 98.2 F (36.8 C) 99.3 F (37.4 C) 99.6 F (37.6 C)  TempSrc: Oral  Oral Oral  SpO2: 100% 97% 100% 99%  Weight:  Height:        Intake/Output Summary (Last 24 hours) at 09/29/2023 1003 Last data filed at 09/29/2023 0600 Gross per 24 hour  Intake 420.42 ml  Output 0 ml  Net 420.42 ml   Filed Weights   09/17/23 0535 09/20/23 1900 09/27/23 0500  Weight: 62.4 kg 62.4 kg 68 kg    Examination:  General exam: Appears calm and comfortable  Respiratory system: Clear to auscultation. Respiratory effort normal. Cardiovascular system: S1 & S2 heard, RRR. No JVD, murmurs, rubs, gallops or clicks. No pedal edema. Gastrointestinal system: Abdomen is nondistended, soft and nontender. No organomegaly or masses felt. Normal bowel sounds heard. Central nervous system: Alert and oriented. No focal neurological deficits. Extremities: Symmetric 5 x 5 power. Skin: No rashes, lesions or ulcers.   Data Reviewed: I have personally reviewed following labs and imaging studies  CBC: Recent Labs  Lab 09/22/23 1059 09/24/23 1215 09/26/23 1625  WBC 9.2 6.4 4.6  NEUTROABS  --   --  1.6*  HGB 7.6* 7.5* 7.2*  HCT 24.6* 25.2* 24.2*  MCV 78.6* 82.1 81.5  PLT 397 383 396   Basic Metabolic Panel: Recent Labs  Lab 09/24/23 1215  NA 137  K 4.2  CL 102  CO2 26  GLUCOSE 97  BUN 13  CREATININE 0.58  CALCIUM 8.5*   GFR: Estimated Creatinine Clearance: 96.9 mL/min (by C-G formula based on SCr of 0.58 mg/dL). Liver Function Tests: Recent Labs  Lab 09/24/23 1215  AST 53*  ALT 34  ALKPHOS 160*  BILITOT 0.6  PROT 7.8  ALBUMIN 2.5*   No results for input(s): "LIPASE", "AMYLASE" in the last 168 hours. No results  for input(s): "AMMONIA" in the last 168 hours. Coagulation Profile: No results for input(s): "INR", "PROTIME" in the last 168 hours. Cardiac Enzymes: No results for input(s): "CKTOTAL", "CKMB", "CKMBINDEX", "TROPONINI" in the last 168 hours. BNP (last 3 results) No results for input(s): "PROBNP" in the last 8760 hours. HbA1C: No results for input(s): "HGBA1C" in the last 72 hours. CBG: No results for input(s): "GLUCAP" in the last 168 hours. Lipid Profile: No results for input(s): "CHOL", "HDL", "LDLCALC", "TRIG", "CHOLHDL", "LDLDIRECT" in the last 72 hours. Thyroid Function Tests: No results for input(s): "TSH", "T4TOTAL", "FREET4", "T3FREE", "THYROIDAB" in the last 72 hours. Anemia Panel: No results for input(s): "VITAMINB12", "FOLATE", "FERRITIN", "TIBC", "IRON", "RETICCTPCT" in the last 72 hours. Sepsis Labs: No results for input(s): "PROCALCITON", "LATICACIDVEN" in the last 168 hours.  Recent Results (from the past 240 hour(s))  Culture, blood (Routine X 2) w Reflex to ID Panel     Status: None   Collection Time: 09/21/23  5:51 AM   Specimen: BLOOD LEFT HAND  Result Value Ref Range Status   Specimen Description   Final    BLOOD LEFT HAND Performed at [facility], [address] [place], [place] [address]    Special Requests   Final    BOTTLES DRAWN AEROBIC AND ANAEROBIC Blood Culture results may not be optimal due to an inadequate volume of blood received in culture bottles Performed at [facility], [address], [place], [place] [address]    Culture   Final    NO GROWTH 5 DAYS Performed at [facility], [address] [place], [place] [address]    Report Status 09/26/2023 FINAL  Final  Culture, blood (Routine X 2) w Reflex to ID Panel     Status: None   Collection Time: 09/21/23  5:54 AM   Specimen: BLOOD LEFT ARM  Result Value Ref Range Status   Specimen Description BLOOD LEFT ARM  Final   Special Requests   Final    BOTTLES  DRAWN AEROBIC AND ANAEROBIC Blood Culture results may not be optimal due to an inadequate volume of blood received in culture bottles   Culture   Final    NO GROWTH 5 DAYS Performed at [facility], [address] [place], [place] [address]    Report Status 09/26/2023 FINAL  Final  Wet prep, genital     Status: Abnormal   Collection Time: 09/24/23 10:13 PM   Specimen: Vaginal  Result Value Ref Range Status   Yeast Wet Prep HPF POC NONE SEEN NONE SEEN Final   Trich, Wet Prep PRESENT (A) NONE SEEN Final   Clue Cells Wet Prep HPF POC NONE SEEN NONE SEEN Final   WBC, Wet Prep HPF POC <10 <10 Final   Sperm NONE SEEN  Final    Comment: Performed at [facility], [address] [place], [place] [address]     Radiology Studies: No results found.  Scheduled Meds:  buprenorphine-naloxone  1 tablet Sublingual TID   clotrimazole  1 Applicatorful Vaginal QHS   ferrous sulfate  325 mg Oral Q breakfast   nicotine  21 mg Transdermal Daily   mouth rinse  15 mL Mouth Rinse 4 times per day   oxyCODONE  5 mg Oral Once   Rivaroxaban  15 mg Oral BID WC   Followed by   Melene Muller ON 10/08/2023] rivaroxaban  20 mg Oral Q supper   sodium chloride flush  3 mL Intravenous Q12H   Continuous Infusions:   ceFAZolin (ANCEF) IV 2 g (09/29/23 0526)     LOS: 14 days   Hughie Closs, MD [facility]  09/29/2023, 10:03 AM   *Please note that this is a verbal dictation therefore any spelling or grammatical errors are due to the "Dragon Medical One" system interpretation.  Please page via Amion and do not message via secure chat for urgent patient care matters. Secure chat can be used for non urgent patient care matters.  How to contact the [facility] Attending or Consulting provider 7A - 7P or covering provider during after hours 7P -7A, for this patient?  Check the care team in [facility] and look for a) attending/consulting TR[facility] provider listed and b) the [facility] team listed. Page or secure  chat 7A-7P. Log into [email or web address] and use [place]'s universal password to access. If you do not have the password, please contact the hospital operator. Locate the [facility] provider you are looking for under [facility] and page to a number that you can be directly reached. If you still have difficulty reaching the provider, please page the [facility] (Director on Call) for the Hospitalists listed on amion for assistance.

## 2023-09-29 NOTE — Progress Notes (Addendum)
14 Days Post-Op Procedure(s) (LRB): IRRIGATION AND DEBRIDEMENT KNEE (Right)  Subjective:  Patient reports knee pain as mild-moderate, moves leg around to demonstrate.  Slept ok.  Lying comfortably in bed this morning.  Denies distal n/t.    Walked 60 ft with PT recently.  Encouraged PT/OT participation.  [place] DC tomorrow on PO antibiotics per ID.  No new complaints or overnight events.    Objective:   VITALS:   Vitals:   09/28/23 0509 09/28/23 1308 09/28/23 2007 09/29/23 0451  BP: (!) 135/57 (!) 130/56 (!) 122/51 (!) 119/50  Pulse: 65 68 62 68  Resp: 16 20 16 16   Temp: 99 F (37.2 C) 98.2 F (36.8 C) 99.3 F (37.4 C) 99.6 F (37.6 C)  TempSrc: Oral  Oral Oral  SpO2: 100% 97% 100% 99%  Weight:      Height:        AAOx4, sitting comfortably in bed in NAD MSK: Sensation intact distally Intact pulses distally, good perfusion Dorsiflexion/Plantar flexion intact Incision: dressing C/D/I.  Dressings of right knee removed, wounds inspected.  Sutures intact, no drainage, no surrounding erythema.  Sutures removed, steris applied.  Drain site almost closed. Compartments soft ROM: hip, knee, ankle of RLE comfortably tolerates gentle ROM, no pain elicited with passive flexion of knee, demonstrates good strength  Wiggles toes appropriately   Lab Results  Component Value Date   WBC 4.6 09/26/2023   HGB 7.2 (L) 09/26/2023   HCT 24.2 (L) 09/26/2023   MCV 81.5 09/26/2023   PLT 396 09/26/2023   BMET    Component Value Date/Time   NA 137 09/24/2023 1215   NA 140 01/09/2016 1538   K 4.2 09/24/2023 1215   CL 102 09/24/2023 1215   CO2 26 09/24/2023 1215   GLUCOSE 97 09/24/2023 1215   BUN 13 09/24/2023 1215   BUN 13 01/09/2016 1538   CREATININE 0.58 09/24/2023 1215   CALCIUM 8.5 (L) 09/24/2023 1215   GFRNONAA >60 09/24/2023 1215    Xray: none  Assessment/Plan: 7 Days Post-Op   Principal Problem:   Group A streptococcal bacteremia Active Problems:   Hepatitis  C   Septic arthritis of knee, right (HCC)   Multiple lung nodules-secondary to presumed septic emboli   Acute deep vein thrombosis (DVT) of axillary vein of right upper extremity (HCC)   Microcytic anemia   IVDU (intravenous drug user), polysubstance abuse and withdrawal   Thyroid nodule   Recurrent fever  S/p R knee I&D 11/25  11/27: Drain removed, change drain dressing PRN if saturated.  Continue abx per ID, cultures pending.  Okay to resume full-dose DVT anticoagulation per medicine.  12/2: Continue with PT/OT.  Bandages look okay, no drainage.  Moving RLE comfortably, reassuring.  Can change ace wrap as needed.  Hgb appears stable, continue DVT full dose for RUE DVT per medicine.  12/4: Encouraged participation with PT/OT.  Hgb 7.6 on 12/2, CBC today pending.  On Xarelto for full dose anticoagulation of RUE DVT per medicine.  Continue abx per ID.  Bandages changed today, incisions nonconcerning -- can change as needed.  Sutures to be removed prior to discharge, likely around 09/29/24.    12/9: Sutures removed, steris can fall off on own.  Continue progressing with PT/OT.  Exam reassuring.  Possible DC tomorrow  Post op recs: WB: WBAT Abx: Continue antibiotics per infectious disease Imaging: None Dressing: Drain and sutures removed post-operatively. Steris and mepilex in place, to leave on for 3-5 days.  DVT prophylaxis: Okay to resume prophylaxis anticoagulation postop day 1, would not start full dose anticoagulation for at least 24 hours pending drain output. DC: Per medicine Follow up: 4 weeks after surgery for a wound check with Dr. Blanchie Dessert at [facility].  Address: [address], [place], [place] [address]  Office Phone: [phone]    Cecil Cobbs 09/29/2023, 7:08 AM   Contact information:   Weekdays 7am-5pm epic message Dr. Blanchie Dessert, or call office for patient follow up: [phone] After hours and holidays please check [email or web address]  for group call information for [facility]

## 2023-09-29 NOTE — Progress Notes (Signed)
OT Cancellation Note  Patient Details Name: FANITA MYKLEBUST MRN: [id number] DOB: 01-17-1996   Cancelled Treatment:    Reason Eval/Treat Not Completed: Patient declined, no reason specified. Will attempt to see tomorrow.  Hope Budds 09/29/2023, 11:39 AM

## 2023-09-29 NOTE — Progress Notes (Signed)
Discharge pharmacy confirmed verbally by this RN w/ [facility]. Patient is requesting [facility] on [address] be used for discharge medications. Amyria states she will be able to pick up the medication. Chart reflects this update.

## 2023-09-30 ENCOUNTER — Other Ambulatory Visit (HOSPITAL_COMMUNITY): Payer: Self-pay

## 2023-09-30 DIAGNOSIS — B955 Unspecified streptococcus as the cause of diseases classified elsewhere: Secondary | ICD-10-CM | POA: Diagnosis not present

## 2023-09-30 DIAGNOSIS — R7881 Bacteremia: Secondary | ICD-10-CM | POA: Diagnosis not present

## 2023-09-30 MED ORDER — RIVAROXABAN 20 MG PO TABS
20.0000 mg | ORAL_TABLET | Freq: Every day | ORAL | 0 refills | Status: AC
Start: 2023-09-30 — End: 2023-11-12
  Filled 2023-09-30 – 2023-10-13 (×3): qty 30, 30d supply, fill #0

## 2023-09-30 MED ORDER — BUPRENORPHINE HCL-NALOXONE HCL 8-2 MG SL SUBL
1.0000 | SUBLINGUAL_TABLET | Freq: Three times a day (TID) | SUBLINGUAL | 0 refills | Status: AC
  Filled 2023-09-30 – 2023-10-13 (×2): qty 24, 8d supply, fill #0

## 2023-09-30 MED ORDER — RIVAROXABAN 15 MG PO TABS
15.0000 mg | ORAL_TABLET | Freq: Two times a day (BID) | ORAL | 0 refills | Status: AC
Start: 2023-09-30 — End: 2023-10-08
  Filled 2023-09-30 – 2023-10-13 (×2): qty 16, 8d supply, fill #0

## 2023-09-30 NOTE — Plan of Care (Signed)

## 2023-09-30 NOTE — Discharge Summary (Signed)
Physician Discharge Summary  Laurie [name] ZOX:[id number] DOB: [birth year] DOA: 09/14/2023  PCP: Oneita Hurt, No  Admit date: 09/14/2023 Discharge date: 09/30/2023 30 Day Unplanned Readmission Risk Score    Flowsheet Row ED to Hosp-Admission (Current) from 09/14/2023 in [facility]  30 Day Unplanned Readmission Risk Score (%) 12.82 Filed at 09/30/2023 1200       This score is the patient's risk of an unplanned readmission within 30 days of being discharged (0 -100%). The score is based on dignosis, age, lab data, medications, orders, and past utilization.   Low:  0-14.9   Medium: 15-21.9   High: 22-29.9   Extreme: 30 and above          Admitted From: Home Disposition: Home  Recommendations for Outpatient Follow-up:  Follow up with PCP in 1-2 weeks Please obtain BMP/CBC in one week Follow-up with ID on 10/07/2023. Follow-up with Dr. Blanchie Dessert at [facility] in 2 weeks. Address: [address], [place], [place] [address] Office Phone: [phone] Follow-up with Dr. Oswaldo Done clinic on 10/07/2023. Please follow up with your PCP on the following pending results: Unresulted Labs (From admission, onward)     Start     Ordered   09/29/23 0500  CBC with Differential/Platelet  Once,   R        09/29/23 0500   09/28/23 0500  CBC with Differential/Platelet  Once,   R        09/28/23 0500   09/27/23 0803  CBC with Differential/Platelet  Once,   R       Question:  Specimen collection method  Answer:  Lab=Lab collect   09/27/23 0802              Home Health: None none Equipment/Devices: None  Discharge Condition: Stable CODE STATUS: Full code Diet recommendation: Cardiac  Subjective: Seen and examined.  No complaints.  She is excited about going home.  Brief/Interim Summary: 27 y.o. F with hx IVDA and polysubstance abuse, hx familial factor V Leiden, smoking, PTSD and chronic hep C who presented with malaise, chest  discomfort, draining arm wounds and right knee swelling. Found to have group A strep bacteremia, pulmonary nodules, right arm DVT, right knee septic arthritis.  Details below.   Group A streptococcal bacteremia Possible endocarditis TEE negative but constellation of streptococcal bacteremia, right knee septic arthritis and lung nodules implies endocarditis.  ID was on board for antibiotic recommendations, patient received 2 weeks of IV antibiotics/cefazolin and she received 1 dose of oritavancin triphosphate 1200 mg today and then she has follow-up with ID on 10/06/2023 on which ID is planning to start her on cefadroxil 1 g twice daily for 4 more weeks.   Recurrent fever She had recurrent fever with last spike of 100.8 at 8:30 AM on 09/22/2023.  Afebrile ever since until 8 PM 09/24/2023 when she was noted to have low-grade fever of 100.5, Could be an error.  Patient denied any respiratory symptoms and chest x-ray obtained by ID was unremarkable.  Patient is afebrile ever since.  Repeat blood culture negative from 09/21/2023.   Septic arthritis of knee, right (HCC) S/p irrigation and debridement right knee by Dr. Blanchie Dessert 11/25 Aspirated by Dr. Madilyn Hook in the ER 11/25.  Aspirate with 63K WBCs, gram stain positive but culture negative.  Taken to OR for washout Drain now removed. - WBAT - ROM as tolerated - PT -  Drain and sutures removed post-operatively. Steris and  mepilex in place, to leave on for 3-5 days.  Follow up: 4 weeks after surgery for a wound check with Dr. Blanchie Dessert at [facility].  Address: [address], [place], [place] [address]  Office Phone: [phone]   Multiple lung nodules-secondary to presumed septic emboli - Repeat imaging as an outpatient per ID   Acute deep vein thrombosis (DVT) of axillary vein of right upper extremity (HCC) New this admission.  - Continue Xarelto for 3-6 months   IVDU (intravenous drug user), polysubstance abuse and  withdrawal - Started on Suboxone here.  I personally have arranged for her to follow-up with Dr. Oswaldo Done office on 10/07/2023 at 3:15 PM, I have prescribed her 8 days of Suboxone.   Thyroid nodule Incidental finding, TSH normal - Outpatient thyroid US recommended.  Will defer to PCP.   Microcytic anemia Hgb stable in the 7s   Hepatitis C Mild transaminitis US liver unremarkable - Follow up with ID for treatment  Discharge plan was discussed with patient and/or family member and they verbalized understanding and agreed with it.  Discharge Diagnoses:  Principal Problem:   Group A streptococcal bacteremia Active Problems:   Recurrent fever   Septic arthritis of knee, right (HCC)   Multiple lung nodules-secondary to presumed septic emboli   Acute deep vein thrombosis (DVT) of axillary vein of right upper extremity (HCC)   IVDU (intravenous drug user), polysubstance abuse and withdrawal   Hepatitis C   Microcytic anemia   Thyroid nodule    Discharge Instructions   Allergies as of 09/30/2023       Reactions   Keflex [cephalexin] Hives   Penicillins Other (See Comments)   Results thru an allergy skin test Has patient had a PCN reaction causing immediate rash, facial/tongue/throat swelling, SOB or lightheadedness with hypotension: No Has patient had a PCN reaction causing severe rash involving mucus membranes or skin necrosis: No Has patient had a PCN reaction that required hospitalization No Has patient had a PCN reaction occurring within the last 10 years: No If all of the above answers are "NO", then may proceed with Cephalosporin use.        Medication List     STOP taking these medications    buPROPion 150 MG 12 hr tablet Commonly known as: Wellbutrin SR   ibuprofen 200 MG tablet Commonly known as: ADVIL   ibuprofen 800 MG tablet Commonly known as: ADVIL   ondansetron 4 MG disintegrating tablet Commonly known as: Zofran ODT   prenatal multivitamin Tabs  tablet   propranolol 20 MG tablet Commonly known as: INDERAL       TAKE these medications    acetaminophen 500 MG tablet Commonly known as: TYLENOL Take 500 mg by mouth every 6 (six) hours as needed for moderate pain (pain score 4-6).   buprenorphine-naloxone 8-2 mg Subl SL tablet Commonly known as: SUBOXONE Place 1 tablet under the tongue 3 (three) times daily for 8 days.   ferrous sulfate 325 (65 FE) MG tablet Take 1 tablet (325 mg total) by mouth daily.   Rivaroxaban 15 MG Tabs tablet Commonly known as: XARELTO Take 1 tablet (15 mg total) by mouth 2 (two) times daily with a meal for 8 days.   rivaroxaban 20 MG Tabs tablet Commonly known as: XARELTO Take 1 tablet (20 mg total) by mouth daily with supper.               Durable Medical Equipment  (From admission, onward)  Start     Ordered   09/29/23 1113  For home use only DME Walker rolling  Once       Question Answer Comment  Walker: With 5 Inch Wheels   Patient needs a walker to treat with the following condition Balance problem      09/29/23 1113            Follow-up Information     Joen Laura, MD Follow up in 2 week(s).   Specialty: Orthopedic Surgery Why: 4 weeks from surgery for wound re-evaluation Contact information: [address] [place] [address] [phone]         PCP Follow up in 1 week(s).                 Allergies  Allergen Reactions   Keflex [Cephalexin] Hives   Penicillins Other (See Comments)    Results thru an allergy skin test Has patient had a PCN reaction causing immediate rash, facial/tongue/throat swelling, SOB or lightheadedness with hypotension: No Has patient had a PCN reaction causing severe rash involving mucus membranes or skin necrosis: No Has patient had a PCN reaction that required hospitalization No Has patient had a PCN reaction occurring within the last 10 years: No If all of the above answers are "NO",  then may proceed with Cephalosporin use.      Consultations: ID and orthopedics, psychiatry   Procedures/Studies: DG CHEST PORT 1 VIEW  Result Date: 09/25/2023 CLINICAL DATA:  Central chest pain. EXAM: PORTABLE CHEST 1 VIEW COMPARISON:  X-ray 09/20/2023 and older.  CT scan 09/15/2023. FINDINGS: Film is slightly rotated to the right. No consolidation, pneumothorax or effusion. Normal cardiopericardial silhouette without edema. Again as stated previously the lung nodules on CT are not well seen on this x-ray. Please see prior recommendation. IMPRESSION: No acute cardiopulmonary disease.  Rotated radiograph Electronically Signed   By: Karen Kays M.D.   On: 09/25/2023 17:06   EP STUDY  Result Date: 09/22/2023 See surgical note for result.  ECHO TEE  Result Date: 09/22/2023    TRANSESOPHOGEAL ECHO REPORT   Patient Name:   Laurie [name] Date of Exam: 09/22/2023 Medical Rec #:  [id number]     Height:       63.0 in Accession #:    [id number]    Weight:       137.6 lb Date of Birth:  Oct 28, 1995     BSA:          1.649 m Patient Age:    27 years      BP:           100/50 mmHg Patient Gender: F             HR:           73 bpm. Exam Location:  Inpatient Procedure: Transesophageal Echo and Color Doppler Indications:     Bacteremia  History:         Patient has prior history of Echocardiogram examinations, most                  recent 09/15/2023. Hepatitis C; Risk Factors:IVDU and Current                  Smoker.  Sonographer:     [facility] Referring Phys:  [id number] Minda Meo HALEY Diagnosing Phys: Thurmon Fair MD  Sonographer Comments: TEE Probe [phone] used for procedure PROCEDURE: After discussion of the risks and benefits of a TEE,  an informed consent was obtained from the patient. The transesophogeal probe was passed without difficulty through the esophogus of the patient. Imaged were obtained with the patient in a left lateral decubitus position. Sedation performed by different physician. The patient was  monitored while under deep sedation. Anesthestetic sedation was provided intravenously by Anesthesiology: 154.6mg  of Propofol, 60mg  of Lidocaine. Image quality was good. The patient's vital signs; including heart rate, blood pressure, and oxygen saturation; remained stable throughout the procedure. The patient developed no complications during the procedure.  IMPRESSIONS  1. Left ventricular ejection fraction, by estimation, is 60 to 65%. The left ventricle has normal function. The left ventricle has no regional wall motion abnormalities.  2. Right ventricular systolic function is normal. The right ventricular size is normal.  3. No left atrial/left atrial appendage thrombus was detected.  4. The mitral valve is normal in structure. No evidence of mitral valve regurgitation. No evidence of mitral stenosis.  5. The aortic valve is normal in structure. Aortic valve regurgitation is not visualized. No aortic stenosis is present.  6. The inferior vena cava is normal in size with greater than 50% respiratory variability, suggesting right atrial pressure of 3 mmHg. Conclusion(s)/Recommendation(s): Normal biventricular function without evidence of hemodynamically significant valvular heart disease. FINDINGS  Left Ventricle: Left ventricular ejection fraction, by estimation, is 60 to 65%. The left ventricle has normal function. The left ventricle has no regional wall motion abnormalities. The left ventricular internal cavity size was normal in size. There is  no left ventricular hypertrophy. Right Ventricle: The right ventricular size is normal. No increase in right ventricular wall thickness. Right ventricular systolic function is normal. Left Atrium: Left atrial size was normal in size. No left atrial/left atrial appendage thrombus was detected. Right Atrium: Right atrial size was normal in size. Pericardium: There is no evidence of pericardial effusion. Mitral Valve: The mitral valve is normal in structure. No evidence  of mitral valve regurgitation. No evidence of mitral valve stenosis. Tricuspid Valve: The tricuspid valve is normal in structure. Tricuspid valve regurgitation is not demonstrated. No evidence of tricuspid stenosis. Aortic Valve: The aortic valve is normal in structure. Aortic valve regurgitation is not visualized. No aortic stenosis is present. Pulmonic Valve: The pulmonic valve was normal in structure. Pulmonic valve regurgitation is not visualized. No evidence of pulmonic stenosis. Aorta: The aortic root is normal in size and structure. Venous: The inferior vena cava is normal in size with greater than 50% respiratory variability, suggesting right atrial pressure of 3 mmHg. IAS/Shunts: No atrial level shunt detected by color flow Doppler. Thurmon Fair MD Electronically signed by Thurmon Fair MD Signature Date/Time: 09/22/2023/10:17:43 AM    Final    DG CHEST PORT 1 VIEW  Result Date: 09/20/2023 CLINICAL DATA:  Group a strep bacteremia, pulmonary nodules, fever EXAM: PORTABLE CHEST 1 VIEW COMPARISON:  Chest radiograph dated 09/15/2023 FINDINGS: Normal lung volumes. No focal consolidations. Pulmonary nodules are better evaluated on prior CTA chest. No pleural effusion or pneumothorax. The heart size and mediastinal contours are within normal limits. No acute osseous abnormality. IMPRESSION: 1.  No focal consolidations. 2. Pulmonary nodules are better evaluated on prior CTA chest. Electronically Signed   By: Agustin Cree M.D.   On: 09/20/2023 14:17   VAS [place] UPPER EXTREMITY VENOUS DUPLEX  Result Date: 09/16/2023 UPPER VENOUS STUDY  Patient Name:  Laurie [name]  Date of Exam:   09/15/2023 Medical Rec #: [id number]      Accession #:  [id number] Date of Birth: 1996/02/24      Patient Gender: F Patient Age:   34 years Exam Location:  [facility] Procedure:      VAS [place] UPPER EXTREMITY VENOUS DUPLEX Referring Phys: Eulah Pont SUNDIL  --------------------------------------------------------------------------------  Indications: Filling defects w/CT Risk Factors: None identified. Limitations: Open wound. Comparison Study: 09/14/2024 - CT Extrem Up Entire Arm R W/CM                   IMPRESSION:                   1. Diffuse soft tissue thickening involving the right upper                   extremity with areas of nodular thickening in the skin. No                   abscess                   is seen.                   2. Filling defects in the subclavian, axillary, and basilic                   veins                   suspicious for deep venous thrombosis. Ultrasound is                   recommended for                   further evaluation.                   3. No acute osseous abnormality. Performing Technologist: Chanda Busing RVT  Examination Guidelines: A complete evaluation includes B-mode imaging, spectral Doppler, color Doppler, and power Doppler as needed of all accessible portions of each vessel. Bilateral testing is considered an integral part of a complete examination. Limited examinations for reoccurring indications may be performed as noted.  Right Findings: +----------+------------+---------+-----------+----------+-------+ RIGHT     CompressiblePhasicitySpontaneousPropertiesSummary +----------+------------+---------+-----------+----------+-------+ IJV           Full       Yes       Yes                      +----------+------------+---------+-----------+----------+-------+ Subclavian    None       No        No                Acute  +----------+------------+---------+-----------+----------+-------+ Axillary      None       No        No                Acute  +----------+------------+---------+-----------+----------+-------+ Brachial    Partial      No        No                Acute  +----------+------------+---------+-----------+----------+-------+ Radial        Full                                           +----------+------------+---------+-----------+----------+-------+ Ulnar         Full                                          +----------+------------+---------+-----------+----------+-------+  Cephalic      None                                   Acute  +----------+------------+---------+-----------+----------+-------+ Basilic       None                                   Acute  +----------+------------+---------+-----------+----------+-------+  Left Findings: +----------+------------+---------+-----------+----------+-------+ LEFT      CompressiblePhasicitySpontaneousPropertiesSummary +----------+------------+---------+-----------+----------+-------+ Subclavian    Full       Yes       Yes                      +----------+------------+---------+-----------+----------+-------+  Summary:  Right: Findings consistent with acute deep vein thrombosis involving the right subclavian vein, right axillary vein and right brachial veins. Findings consistent with acute superficial vein thrombosis involving the right basilic vein and right cephalic vein.  Left: No evidence of thrombosis in the subclavian.  *See table(s) above for measurements and observations.  Diagnosing physician: Carolynn Sayers Electronically signed by Carolynn Sayers on 09/16/2023 at 7:31:24 AM.    Final    ECHOCARDIOGRAM COMPLETE  Result Date: 09/15/2023    ECHOCARDIOGRAM REPORT   Patient Name:   Laurie [name] Date of Exam: 09/15/2023 Medical Rec #:  [id number]     Height:       63.0 in Accession #:    [id number]    Weight:       120.0 lb Date of Birth:  1996/01/29     BSA:          1.556 m Patient Age:    27 years      BP:           134/71 mmHg Patient Gender: F             HR:           69 bpm. Exam Location:  Inpatient Procedure: 2D Echo, Cardiac Doppler and Color Doppler Indications:    IVDU  History:        Patient has no prior history of Echocardiogram examinations.  Sonographer:    Vern Claude Referring  Phys: [id number] SUBRINA SUNDIL IMPRESSIONS  1. Left ventricular ejection fraction, by estimation, is 55 to 60%. The left ventricle has normal function. The left ventricle has no regional wall motion abnormalities. Left ventricular diastolic parameters were normal.  2. Right ventricular systolic function is normal. The right ventricular size is normal.  3. The mitral valve is normal in structure. Trivial mitral valve regurgitation. No evidence of mitral stenosis.  4. The aortic valve is normal in structure. Aortic valve regurgitation is not visualized. No aortic stenosis is present.  5. The inferior vena cava is normal in size with greater than 50% respiratory variability, suggesting right atrial pressure of 3 mmHg. FINDINGS  Left Ventricle: Left ventricular ejection fraction, by estimation, is 55 to 60%. The left ventricle has normal function. The left ventricle has no regional wall motion abnormalities. The left ventricular internal cavity size was normal in size. There is  no left ventricular hypertrophy. Left ventricular diastolic parameters were normal. Right Ventricle: The right ventricular size is normal. No increase in right ventricular wall thickness. Right ventricular systolic function is normal. Left Atrium: Left atrial size was normal in size. Right Atrium: Right atrial size was normal in  size. Pericardium: There is no evidence of pericardial effusion. Mitral Valve: The mitral valve is normal in structure. Trivial mitral valve regurgitation. No evidence of mitral valve stenosis. Tricuspid Valve: The tricuspid valve is normal in structure. Tricuspid valve regurgitation is trivial. No evidence of tricuspid stenosis. Aortic Valve: The aortic valve is normal in structure. Aortic valve regurgitation is not visualized. No aortic stenosis is present. Aortic valve mean gradient measures 3.0 mmHg. Aortic valve peak gradient measures 6.0 mmHg. Aortic valve area, by VTI measures 1.64 cm. Pulmonic Valve: The pulmonic  valve was normal in structure. Pulmonic valve regurgitation is trivial. No evidence of pulmonic stenosis. Aorta: The aortic root is normal in size and structure. Venous: The inferior vena cava is normal in size with greater than 50% respiratory variability, suggesting right atrial pressure of 3 mmHg. IAS/Shunts: No atrial level shunt detected by color flow Doppler.  LEFT VENTRICLE PLAX 2D LVIDd:         4.80 cm      Diastology LVIDs:         3.00 cm      LV e' medial:    11.40 cm/s LV PW:         0.70 cm      LV E/e' medial:  8.2 LV IVS:        0.50 cm      LV e' lateral:   11.50 cm/s LVOT diam:     1.80 cm      LV E/e' lateral: 8.1 LV SV:         40 LV SV Index:   26 LVOT Area:     2.54 cm  LV Volumes (MOD) LV vol d, MOD A2C: 91.2 ml LV vol d, MOD A4C: 117.0 ml LV vol s, MOD A2C: 33.8 ml LV vol s, MOD A4C: 48.6 ml LV SV MOD A2C:     57.4 ml LV SV MOD A4C:     117.0 ml LV SV MOD BP:      60.5 ml RIGHT VENTRICLE             IVC RV Basal diam:  3.70 cm     IVC diam: 1.80 cm RV Mid diam:    3.20 cm RV S prime:     12.10 cm/s TAPSE (M-mode): 2.8 cm LEFT ATRIUM             Index        RIGHT ATRIUM           Index LA Vol (A2C):   33.4 ml 21.46 ml/m  RA Area:     10.90 cm LA Vol (A4C):   30.6 ml 19.66 ml/m  RA Volume:   26.40 ml  16.96 ml/m LA Biplane Vol: 33.6 ml 21.59 ml/m  AORTIC VALVE                    PULMONIC VALVE AV Area (Vmax):    1.77 cm     PV Vmax:          0.93 m/s AV Area (Vmean):   1.60 cm     PV Peak grad:     3.5 mmHg AV Area (VTI):     1.64 cm     PR End Diast Vel: 1.74 msec AV Vmax:           122.00 cm/s AV Vmean:          81.600 cm/s AV VTI:  0.247 m AV Peak Grad:      6.0 mmHg AV Mean Grad:      3.0 mmHg LVOT Vmax:         85.00 cm/s LVOT Vmean:        51.200 cm/s LVOT VTI:          0.159 m LVOT/AV VTI ratio: 0.64  AORTA Ao Root diam: 2.40 cm Ao Asc diam:  2.30 cm MITRAL VALVE MV Area (PHT): 4.31 cm    SHUNTS MV Decel Time: 176 msec    Systemic VTI:  0.16 m MV E velocity: 93.50  cm/s  Systemic Diam: 1.80 cm MV A velocity: 44.30 cm/s MV E/A ratio:  2.11 Arvilla Meres MD Electronically signed by Arvilla Meres MD Signature Date/Time: 09/15/2023/2:42:46 PM    Final    CT FOREARM LEFT W CONTRAST  Result Date: 09/15/2023 CLINICAL DATA:  Soft tissue infection suspected, forearm, no prior imaging. History of polysubstance abuse. EXAM: CT OF THE UPPER LEFT EXTREMITY WITH CONTRAST TECHNIQUE: Multidetector CT imaging of the left forearm was performed according to the standard protocol following intravenous contrast administration. RADIATION DOSE REDUCTION: This exam was performed according to the departmental dose-optimization program which includes automated exposure control, adjustment of the mA and/or kV according to patient size and/or use of iterative reconstruction technique. CONTRAST:  50mL OMNIPAQUE IOHEXOL 300 MG/ML  SOLN COMPARISON:  None Available. FINDINGS: Bones/Joint/Cartilage No evidence of acute fracture, dislocation or osteomyelitis. No significant elbow or wrist joint effusions demonstrated. The joint spaces appear preserved. Ligaments Suboptimally assessed by CT. Muscles and Tendons No focal intramuscular fluid collection or abnormal enhancement identified. No significant tenosynovitis or tendon sheath air is seen. Soft tissues Soft tissue swelling and scattered air bubbles within the dorsal and radial aspect of the distal forearm. There is a small amount of surrounding ill-defined fluid, but no drainable collections are identified. No evidence of foreign body. No acute vascular findings are identified. IMPRESSION: 1. Soft tissue swelling and scattered air bubbles within the dorsal and radial aspect of the distal forearm consistent with cellulitis. No drainable collections identified. 2. No CT evidence of osteomyelitis or septic arthritis. Electronically Signed   By: Carey Bullocks M.D.   On: 09/15/2023 11:24   US Abdomen Complete  Result Date: 09/15/2023 CLINICAL  DATA:  27 year old female with chest pain and shortness of breath. EXAM: ABDOMEN ULTRASOUND COMPLETE COMPARISON:  CTA chest 0342 hours today. FINDINGS: Gallbladder: Partially contracted. This appears to account for the appearance of the gallbladder wall on image 3. No sludge or stones identified. No sonographic Murphy sign elicited. Common bile duct: Diameter: Could not be visualized. Liver: No focal lesion identified. Within normal limits in parenchymal echogenicity. Portal vein is patent on color Doppler imaging with normal direction of blood flow towards the liver. IVC: No abnormality visualized. Pancreas: Visualized portion unremarkable. Spleen: Splenomegaly. Estimated splenic volume 695 mL (normal splenic volume range 83 - 412 mL). No discrete splenic lesion. Right Kidney: Length: 11.4 cm. Echogenicity within normal limits. No mass or hydronephrosis visualized. Left Kidney: Length: 12.6 cm. Echogenicity within normal limits. No mass or hydronephrosis visualized. Abdominal aorta: No aneurysm visualized. Other findings: No free fluid identified. IMPRESSION: 1. Splenomegaly. 2. Partially contracted gallbladder. No evidence of cholelithiasis or acute cholecystitis. 3. Common bile duct could not be visualized but otherwise negative ultrasound appearance of the liver. Electronically Signed   By: Odessa Fleming M.D.   On: 09/15/2023 07:14   CT Extrem Up Entire Arm R W/CM  Result  Date: 09/15/2023 CLINICAL DATA:  Soft tissue mass, forearm, concern for abscess. EXAM: CT OF THE UPPER RIGHT EXTREMITY WITH CONTRAST TECHNIQUE: Multidetector CT imaging of the upper right extremity was performed according to the standard protocol following intravenous contrast administration. RADIATION DOSE REDUCTION: This exam was performed according to the departmental dose-optimization program which includes automated exposure control, adjustment of the mA and/or kV according to patient size and/or use of iterative reconstruction technique.  CONTRAST:  OMNIPAQUE IOHEXOL 350 MG/ML SOLN COMPARISON:  None Available. FINDINGS: Bones/Joint/Cartilage No acute fracture or dislocation is seen. No bony erosions or periosteal elevation is identified. No joint effusion is identified. Ligaments Suboptimally assessed by CT. Muscles and Tendons No intramuscular abscess or edema. Soft tissues Diffuse subcutaneous edema is noted in the right upper extremity. No subcutaneous emphysema is seen. Focal subcutaneous nodular densities are noted in the dermis at the antecubital space, mid forearm, and wrist. No abscess is seen. Filling defects are present in the basilic, axillary, and subclavian veins. There is suboptimal opacification of the radial and ulnar veins. No other acute abnormality is seen. IMPRESSION: 1. Diffuse soft tissue thickening involving the right upper extremity with areas of nodular thickening in the skin. No abscess is seen. 2. Filling defects in the subclavian, axillary, and basilic veins suspicious for deep venous thrombosis. Ultrasound is recommended for further evaluation. 3. No acute osseous abnormality. Critical Value/emergent results were called by telephone at the time of interpretation on 09/15/2023 at 4:53 am to provider [facility] , who verbally acknowledged these results. Electronically Signed   By: Thornell Sartorius M.D.   On: 09/15/2023 04:55   CT Angio Chest PE W and/or Wo Contrast  Result Date: 09/15/2023 CLINICAL DATA:  Pulmonary embolism suspected, high probability. Chest pain and shortness of breath. Family history of factor 5 Leiden. EXAM: CT ANGIOGRAPHY CHEST WITH CONTRAST TECHNIQUE: Multidetector CT imaging of the chest was performed using the standard protocol during bolus administration of intravenous contrast. Multiplanar CT image reconstructions and MIPs were obtained to evaluate the vascular anatomy. RADIATION DOSE REDUCTION: This exam was performed according to the departmental dose-optimization program which  includes automated exposure control, adjustment of the mA and/or kV according to patient size and/or use of iterative reconstruction technique. CONTRAST:  OMNIPAQUE IOHEXOL 350 MG/ML SOLN COMPARISON:  None Available. FINDINGS: Cardiovascular: The heart is normal in size and there is no pericardial effusion. The aorta and pulmonary trunk are normal in caliber. No evidence of pulmonary embolism is seen. Mediastinum/Nodes: No mediastinal or hilar lymphadenopathy is seen. Prominent lymph nodes are noted in the right axilla, likely reactive. There is a hypodense nodule in the left lobe of the thyroid gland measuring 1.7 cm. The trachea and esophagus are within normal limits. Lungs/Pleura: Central bronchial wall thickening is present bilaterally. Nodular opacities are noted in the right upper lobe measuring 1 cm, axial image 55 and 1 cm axial image 82. There is a 9 mm nodular opacity in the right lower lobe, axial image 109. 5 mm nodule is present in the left lower lobe, axial image 82. No effusion or pneumothorax is seen. Upper Abdomen: Hypervascular focus is present in the anterior right lobe of the liver, possible flash filling hemangioma. No acute abnormality is seen. Musculoskeletal: No acute osseous abnormality. Review of the MIP images confirms the above findings. IMPRESSION: 1. No evidence of pulmonary embolism. 2. Scattered nodular opacities in the lungs bilaterally measuring up to 1 cm, which may be infectious, inflammatory, or neoplastic. Non-contrast chest CT  at 3-6 months is recommended. If the nodules are stable at time of repeat CT, then future CT at 18-24 months (from today's scan) is considered optional for low-risk patients, but is recommended for high-risk patients. This recommendation follows the consensus statement: Guidelines for Management of Incidental Pulmonary Nodules Detected on CT Images: From the Fleischner Society 2017; Radiology 2017; 284:228-243. 3. 1.7 cm left thyroid nodule.  Nonemergent thyroid ultrasound is recommended. Electronically Signed   By: Thornell Sartorius M.D.   On: 09/15/2023 04:25   DG Chest Port 1 View  Result Date: 09/15/2023 CLINICAL DATA:  Possible sepsis. EXAM: PORTABLE CHEST 1 VIEW COMPARISON:  07/11/2014. FINDINGS: The heart size and mediastinal contours are within normal limits. No consolidation, effusion, or pneumothorax. No acute osseous abnormality. IMPRESSION: No active disease. Electronically Signed   By: Thornell Sartorius M.D.   On: 09/15/2023 01:48   DG Knee Complete 4 Views Right  Result Date: 09/15/2023 CLINICAL DATA:  Questionable sepsis EXAM: RIGHT KNEE - COMPLETE 4+ VIEW COMPARISON:  None Available. FINDINGS: No evidence of fracture, dislocation, or joint effusion. No evidence of arthropathy or other focal bone abnormality. Soft tissues are unremarkable. IMPRESSION: Negative. Electronically Signed   By: Darliss Cheney M.D.   On: 09/15/2023 01:48   DG Elbow Complete Right  Result Date: 09/15/2023 CLINICAL DATA:  Possible sepsis. EXAM: RIGHT ELBOW - COMPLETE 3+ VIEW COMPARISON:  None Available. FINDINGS: There is no evidence of acute fracture, dislocation, or joint effusion. There is no evidence of arthropathy or other focal bone abnormality. Diffuse soft tissue swelling is seen. IMPRESSION: No acute osseous abnormality. Electronically Signed   By: Thornell Sartorius M.D.   On: 09/15/2023 01:48     Discharge Exam: Vitals:   09/29/23 1323 09/30/23 0523  BP: (!) 113/44 (!) 129/56  Pulse: (!) 57 64  Resp:  16  Temp: 99 F (37.2 C) 99.6 F (37.6 C)  SpO2: 100% 99%   Vitals:   09/28/23 2007 09/29/23 0451 09/29/23 1323 09/30/23 0523  BP: (!) 122/51 (!) 119/50 (!) 113/44 (!) 129/56  Pulse: 62 68 (!) 57 64  Resp: 16 16  16   Temp: 99.3 F (37.4 C) 99.6 F (37.6 C) 99 F (37.2 C) 99.6 F (37.6 C)  TempSrc: Oral Oral Oral Oral  SpO2: 100% 99% 100% 99%  Weight:      Height:        General: Pt is alert, awake, not in acute  distress Cardiovascular: RRR, S1/S2 +, no rubs, no gallops Respiratory: CTA bilaterally, no wheezing, no rhonchi Abdominal: Soft, NT, ND, bowel sounds + Extremities: no edema, no cyanosis    The results of significant diagnostics from this hospitalization (including imaging, microbiology, ancillary and laboratory) are listed below for reference.     Microbiology: Recent Results (from the past 240 hour(s))  Culture, blood (Routine X 2) w Reflex to ID Panel     Status: None   Collection Time: 09/21/23  5:51 AM   Specimen: BLOOD LEFT HAND  Result Value Ref Range Status   Specimen Description   Final    BLOOD LEFT HAND Performed at [facility], [address], [place], [place] [address]    Special Requests   Final    BOTTLES DRAWN AEROBIC AND ANAEROBIC Blood Culture results may not be optimal due to an inadequate volume of blood received in culture bottles Performed at [facility], [address] [place], [place] [address]    Culture   Final  NO GROWTH 5 DAYS Performed at [facility], [address], [place], [place] [address]    Report Status 09/26/2023 FINAL  Final  Culture, blood (Routine X 2) w Reflex to ID Panel     Status: None   Collection Time: 09/21/23  5:54 AM   Specimen: BLOOD LEFT ARM  Result Value Ref Range Status   Specimen Description BLOOD LEFT ARM  Final   Special Requests   Final    BOTTLES DRAWN AEROBIC AND ANAEROBIC Blood Culture results may not be optimal due to an inadequate volume of blood received in culture bottles   Culture   Final    NO GROWTH 5 DAYS Performed at [facility], [address] [place], [place] [address]    Report Status 09/26/2023 FINAL  Final  Wet prep, genital     Status: Abnormal   Collection Time: 09/24/23 10:13 PM   Specimen: Vaginal  Result Value Ref Range Status   Yeast Wet Prep HPF POC NONE SEEN NONE SEEN Final   Trich, Wet Prep PRESENT (A) NONE SEEN Final   Clue Cells Wet Prep  HPF POC NONE SEEN NONE SEEN Final   WBC, Wet Prep HPF POC <10 <10 Final   Sperm NONE SEEN  Final    Comment: Performed at [facility], [address], [place], [place] [address]     Labs: BNP (last 3 results) No results for input(s): "BNP" in the last 8760 hours. Basic Metabolic Panel: Recent Labs  Lab 09/24/23 1215  NA 137  K 4.2  CL 102  CO2 26  GLUCOSE 97  BUN 13  CREATININE 0.58  CALCIUM 8.5*   Liver Function Tests: Recent Labs  Lab 09/24/23 1215  AST 53*  ALT 34  ALKPHOS 160*  BILITOT 0.6  PROT 7.8  ALBUMIN 2.5*   No results for input(s): "LIPASE", "AMYLASE" in the last 168 hours. No results for input(s): "AMMONIA" in the last 168 hours. CBC: Recent Labs  Lab 09/24/23 1215 09/26/23 1625  WBC 6.4 4.6  NEUTROABS  --  1.6*  HGB 7.5* 7.2*  HCT 25.2* 24.2*  MCV 82.1 81.5  PLT 383 396   Cardiac Enzymes: No results for input(s): "CKTOTAL", "CKMB", "CKMBINDEX", "TROPONINI" in the last 168 hours. BNP: Invalid input(s): "POCBNP" CBG: No results for input(s): "GLUCAP" in the last 168 hours. D-Dimer No results for input(s): "DDIMER" in the last 72 hours. Hgb A1c No results for input(s): "HGBA1C" in the last 72 hours. Lipid Profile No results for input(s): "CHOL", "HDL", "LDLCALC", "TRIG", "CHOLHDL", "LDLDIRECT" in the last 72 hours. Thyroid function studies No results for input(s): "TSH", "T4TOTAL", "T3FREE", "THYROIDAB" in the last 72 hours.  Invalid input(s): "FREET3" Anemia work up No results for input(s): "VITAMINB12", "FOLATE", "FERRITIN", "TIBC", "IRON", "RETICCTPCT" in the last 72 hours. Urinalysis    Component Value Date/Time   COLORURINE STRAW (A) 09/25/2023 0117   APPEARANCEUR CLEAR 09/25/2023 0117   APPEARANCEUR Cloudy 11/06/2014 2301   LABSPEC 1.008 09/25/2023 0117   LABSPEC 1.005 11/06/2014 2301   PHURINE 7.0 09/25/2023 0117   GLUCOSEU NEGATIVE 09/25/2023 0117   GLUCOSEU Negative 11/06/2014 2301   HGBUR NEGATIVE  09/25/2023 0117   BILIRUBINUR NEGATIVE 09/25/2023 0117   BILIRUBINUR negative 01/09/2016 1448   BILIRUBINUR Negative 11/06/2014 2301   KETONESUR NEGATIVE 09/25/2023 0117   PROTEINUR NEGATIVE 09/25/2023 0117   UROBILINOGEN 4.0 01/09/2016 1448   UROBILINOGEN 2.0 (H) 08/31/2015 1840   NITRITE NEGATIVE 09/25/2023 0117  LEUKOCYTESUR TRACE (A) 09/25/2023 0117   LEUKOCYTESUR 2+ 11/06/2014 2301   Sepsis Labs Recent Labs  Lab 09/24/23 1215 09/26/23 1625  WBC 6.4 4.6   Microbiology Recent Results (from the past 240 hour(s))  Culture, blood (Routine X 2) w Reflex to ID Panel     Status: None   Collection Time: 09/21/23  5:51 AM   Specimen: BLOOD LEFT HAND  Result Value Ref Range Status   Specimen Description   Final    BLOOD LEFT HAND Performed at [facility], [address] [place], [place] [address]    Special Requests   Final    BOTTLES DRAWN AEROBIC AND ANAEROBIC Blood Culture results may not be optimal due to an inadequate volume of blood received in culture bottles Performed at [facility], [address] [place], [place] [address]    Culture   Final    NO GROWTH 5 DAYS Performed at [facility], [address] [place], [place] [address]    Report Status 09/26/2023 FINAL  Final  Culture, blood (Routine X 2) w Reflex to ID Panel     Status: None   Collection Time: 09/21/23  5:54 AM   Specimen: BLOOD LEFT ARM  Result Value Ref Range Status   Specimen Description BLOOD LEFT ARM  Final   Special Requests   Final    BOTTLES DRAWN AEROBIC AND ANAEROBIC Blood Culture results may not be optimal due to an inadequate volume of blood received in culture bottles   Culture   Final    NO GROWTH 5 DAYS Performed at [facility], [address], [place], [place] [address]    Report Status 09/26/2023 FINAL  Final  Wet prep, genital     Status: Abnormal   Collection Time: 09/24/23 10:13 PM   Specimen: Vaginal  Result Value Ref Range Status    Yeast Wet Prep HPF POC NONE SEEN NONE SEEN Final   Trich, Wet Prep PRESENT (A) NONE SEEN Final   Clue Cells Wet Prep HPF POC NONE SEEN NONE SEEN Final   WBC, Wet Prep HPF POC <10 <10 Final   Sperm NONE SEEN  Final    Comment: Performed at [facility], [address], [place], [place] [address]    FURTHER DISCHARGE INSTRUCTIONS:   Get Medicines reviewed and adjusted: Please take all your medications with you for your next visit with your Primary MD   Laboratory/radiological data: Please request your Primary MD to go over all hospital tests and procedure/radiological results at the follow up, please ask your Primary MD to get all Hospital records sent to his/her office.   In some cases, they will be blood work, cultures and biopsy results pending at the time of your discharge. Please request that your primary care M.D. goes through all the records of your hospital data and follows up on these results.   Also Note the following: If you experience worsening of your admission symptoms, develop shortness of breath, life threatening emergency, suicidal or homicidal thoughts you must seek medical attention immediately by calling 911 or calling your MD immediately  if symptoms less severe.   You must read complete instructions/literature along with all the possible adverse reactions/side effects for all the Medicines you take and that have been prescribed to you. Take any new Medicines after you have completely understood and accpet all the possible adverse reactions/side effects.    Do not drive when taking Pain medications  or sleeping medications (Benzodaizepines)   Do not take more than prescribed Pain, Sleep and Anxiety Medications. It is not advisable to combine anxiety,sleep and pain medications without talking with your primary care practitioner   Special Instructions: If you have smoked or chewed Tobacco  in the last 2 yrs please stop smoking, stop any regular Alcohol   and or any Recreational drug use.   Wear Seat belts while driving.   Please note: You were cared for by a hospitalist during your hospital stay. Once you are discharged, your primary care physician will handle any further medical issues. Please note that NO REFILLS for any discharge medications will be authorized once you are discharged, as it is imperative that you return to your primary care physician (or establish a relationship with a primary care physician if you do not have one) for your post hospital discharge needs so that they can reassess your need for medications and monitor your lab values  Time coordinating discharge: Over 30 minutes  SIGNED:   Hughie Closs, MD  [facility] 09/30/2023, 12:05 PM *Please note that this is a verbal dictation therefore any spelling or grammatical errors are due to the "[employer] One" system interpretation. If 7PM-7AM, please contact night-coverage [email or web address]

## 2023-10-07 ENCOUNTER — Ambulatory Visit: Payer: MEDICAID | Admitting: Student

## 2023-10-10 ENCOUNTER — Other Ambulatory Visit (HOSPITAL_COMMUNITY): Payer: Self-pay

## 2023-10-10 ENCOUNTER — Inpatient Hospital Stay: Payer: MEDICAID | Admitting: Internal Medicine

## 2023-10-13 ENCOUNTER — Other Ambulatory Visit (HOSPITAL_COMMUNITY): Payer: Self-pay
